# Patient Record
Sex: Male | Born: 1965 | Race: Black or African American | Hispanic: No | Smoking: Never smoker
Health system: Southern US, Community
[De-identification: ages and names within clinical notes are randomized; demographics above are authoritative.]

## PROBLEM LIST (undated history)

## (undated) ENCOUNTER — Emergency Department (HOSPITAL_COMMUNITY): Payer: Medicare Other | Source: Home / Self Care

## (undated) DIAGNOSIS — R0602 Shortness of breath: Secondary | ICD-10-CM

## (undated) DIAGNOSIS — M109 Gout, unspecified: Secondary | ICD-10-CM

## (undated) DIAGNOSIS — N289 Disorder of kidney and ureter, unspecified: Secondary | ICD-10-CM

## (undated) DIAGNOSIS — I499 Cardiac arrhythmia, unspecified: Secondary | ICD-10-CM

## (undated) DIAGNOSIS — G4700 Insomnia, unspecified: Secondary | ICD-10-CM

## (undated) DIAGNOSIS — Z930 Tracheostomy status: Secondary | ICD-10-CM

## (undated) DIAGNOSIS — K219 Gastro-esophageal reflux disease without esophagitis: Secondary | ICD-10-CM

## (undated) DIAGNOSIS — D649 Anemia, unspecified: Secondary | ICD-10-CM

## (undated) DIAGNOSIS — G4730 Sleep apnea, unspecified: Secondary | ICD-10-CM

## (undated) DIAGNOSIS — I10 Essential (primary) hypertension: Secondary | ICD-10-CM

## (undated) DIAGNOSIS — R5383 Other fatigue: Secondary | ICD-10-CM

## (undated) DIAGNOSIS — E7800 Pure hypercholesterolemia, unspecified: Secondary | ICD-10-CM

## (undated) DIAGNOSIS — R809 Proteinuria, unspecified: Secondary | ICD-10-CM

## (undated) DIAGNOSIS — I509 Heart failure, unspecified: Secondary | ICD-10-CM

## (undated) DIAGNOSIS — J189 Pneumonia, unspecified organism: Secondary | ICD-10-CM

## (undated) DIAGNOSIS — N189 Chronic kidney disease, unspecified: Secondary | ICD-10-CM

## (undated) HISTORY — DX: Heart failure, unspecified: I50.9

## (undated) HISTORY — DX: Pure hypercholesterolemia, unspecified: E78.00

## (undated) HISTORY — DX: Insomnia, unspecified: G47.00

## (undated) HISTORY — DX: Shortness of breath: R06.02

## (undated) HISTORY — PX: TRACHELECTOMY: SHX6586

## (undated) HISTORY — DX: Tracheostomy status: Z93.0

## (undated) HISTORY — DX: Gout, unspecified: M10.9

## (undated) HISTORY — DX: Other fatigue: R53.83

## (undated) HISTORY — DX: Disorder of kidney and ureter, unspecified: N28.9

## (undated) HISTORY — DX: Chronic kidney disease, unspecified: N18.9

## (undated) HISTORY — DX: Proteinuria, unspecified: R80.9

## (undated) HISTORY — PX: TRACHEOSTOMY: SUR1362

## (undated) HISTORY — DX: Essential (primary) hypertension: I10

---

## 2011-11-20 DIAGNOSIS — Z930 Tracheostomy status: Secondary | ICD-10-CM | POA: Insufficient documentation

## 2017-10-22 ENCOUNTER — Encounter: Payer: Self-pay | Admitting: Pulmonary Disease

## 2017-10-23 ENCOUNTER — Encounter: Payer: Self-pay | Admitting: Pulmonary Disease

## 2017-10-23 ENCOUNTER — Institutional Professional Consult (permissible substitution): Payer: Self-pay | Admitting: Pulmonary Disease

## 2017-10-23 ENCOUNTER — Ambulatory Visit (INDEPENDENT_AMBULATORY_CARE_PROVIDER_SITE_OTHER): Payer: Medicare Other | Admitting: Pulmonary Disease

## 2017-10-23 DIAGNOSIS — G4733 Obstructive sleep apnea (adult) (pediatric): Secondary | ICD-10-CM | POA: Diagnosis not present

## 2017-10-23 DIAGNOSIS — Z930 Tracheostomy status: Secondary | ICD-10-CM | POA: Diagnosis not present

## 2017-10-23 NOTE — Patient Instructions (Signed)
Schedule split study with tracheostomy closed and off oxygen  Based on this, we will see if you need CPAP

## 2017-10-23 NOTE — Assessment & Plan Note (Signed)
He is interested in seeing whether he can do without a tracheostomy. We will perform attended polysomnogram with tracheostomy And use oxygen only if needed   The pathophysiology of obstructive sleep apnea , it's cardiovascular consequences & modes of treatment including CPAP were discused with the patient in detail & they evidenced understanding.

## 2017-10-23 NOTE — Progress Notes (Signed)
Subjective:    Patient ID: Jack Cox, male    DOB: 21-Feb-1966, 51 y.o.   MRN: [id number]  HPI  Chief Complaint  Patient presents with  . Sleep Consult    Has a history of OSA. Was diagnosed while he was in the [employer]. Is currently [employer] as his DME. [facility] keeps sending the incorrect supplies.     51 year old obese man with chronic tracheostomy and severe OSA presents for further management. He was diagnosed with severe OSA in 1992 by sleep study done in [facility].  He went to [facility] for urological procedure in 2001 and came out with a tracheostomy.  Since then he is also been maintained on 4 L oxygen.  He has seen his ENT Dr. Renato Battles and undergoes tracheostomy change every few months.  I have reviewed ENT notes and there seems to be some interest in decannulation provided he would tolerate CPAP with tracheostomy. Patient however has had some issues with DME and he is under the impression that this consultation is so that he can get supplies from DME.  He has been with [facility] for many years liquid oxygen and has had problems getting the right kind of tubing with valves to commit to his oxygen machine.  His PCP has initiated a change of DME to [facility] and he is awaiting supplies.  Epworth sleepiness score is 19 and he reports sleepiness in various situations including sitting and reading, watching TV, sitting quietly after lunch was a passenger in a car.  Bedtime is between 1 PM and midnight, he is compliant with his oxygen and sleeps with his tracheostomy uncapped, sleep latency is about 30 minutes, he sleeps on his side with 2 pillows, reports 2-3 nocturnal awakenings including nocturia.  He takes Lasix 2 tablets in the morning and one around 7 PM.  Is out of bed by 7 AM feeling tired with occasional headaches, denies dryness of mouth. He is to weigh around 385 pounds around 2001 and now has lost considerable weight to 67 pounds.  He is a hypertensive and also  carries a diagnosis of diastolic heart failure There is no history suggestive of cataplexy, sleep paralysis or parasomnias    Past Medical History:  Diagnosis Date  . CKD (chronic kidney disease)   . Congestive heart failure (CHF) ([place])   . Essential hypertension   . Fatigue   . Gout   . Hypercholesteremia   . Insomnia   . Proteinuria   . Renal insufficiency   . Shortness of breath   . Tracheostomy dependence ([place])     No Known Allergies   Social History   Socioeconomic History  . Marital status: Married    Spouse name: Not on file  . Number of children: Not on file  . Years of education: Not on file  . Highest education level: Not on file  Social Needs  . Financial resource strain: Not on file  . Food insecurity - worry: Not on file  . Food insecurity - inability: Not on file  . Transportation needs - medical: Not on file  . Transportation needs - non-medical: Not on file  Occupational History  . Not on file  Tobacco Use  . Smoking status: Never Smoker  . Smokeless tobacco: Never Used  Substance and Sexual Activity  . Alcohol use: No    Frequency: Never  . Drug use: No  . Sexual activity: Not on file  Other Topics Concern  . Not on file  Social History Narrative  . Not on file       Family History  Problem Relation Age of Onset  . Stomach cancer Mother   . Colon cancer Father      Review of Systems Positive for weight loss as well, feet swelling, joint stiffness  Constitutional: negative for anorexia, fevers and sweats  Eyes: negative for irritation, redness and visual disturbance  Ears, nose, mouth, throat, and face: negative for earaches, epistaxis, nasal congestion and sore throat  Respiratory: negative for cough, dyspnea on exertion, sputum and wheezing  Cardiovascular: negative for chest pain, dyspnea, lower extremity edema, orthopnea, palpitations and syncope  Gastrointestinal: negative for abdominal pain, constipation, diarrhea, melena,  nausea and vomiting  Genitourinary:negative for dysuria, frequency and hematuria  Hematologic/lymphatic: negative for bleeding, easy bruising and lymphadenopathy  Musculoskeletal:negative for arthralgias, muscle weakness and stiff joints  Neurological: negative for coordination problems, gait problems, headaches and weakness  Endocrine: negative for diabetic symptoms including polydipsia, polyuria and weight loss     Objective:   Physical Exam  Gen. Pleasant, obese, in no distress, normal affect ENT - #4 tstomy , no post nasal drip, class 3 airway Neck: No JVD, no thyromegaly, no carotid bruits Lungs: no use of accessory muscles, no dullness to percussion, decreased without rales or rhonchi  Cardiovascular: Rhythm regular, heart sounds  normal, no murmurs or gallops, no peripheral edema Abdomen: soft and non-tender, no hepatosplenomegaly, BS normal. Musculoskeletal: No deformities, no cyanosis or clubbing Neuro:  alert, non focal, no tremors       Assessment & Plan:

## 2017-10-23 NOTE — Assessment & Plan Note (Signed)
He has size 4 tracheostomy. He would need to tolerate CPAP with good compliance and good control of events and only then we can consider tracheostomy decannulation.  We will start working towards this goal

## 2017-12-02 ENCOUNTER — Ambulatory Visit: Payer: Medicare Other | Attending: Pulmonary Disease | Admitting: Pulmonary Disease

## 2017-12-02 DIAGNOSIS — G4733 Obstructive sleep apnea (adult) (pediatric): Secondary | ICD-10-CM | POA: Insufficient documentation

## 2017-12-04 ENCOUNTER — Telehealth: Payer: Self-pay | Admitting: Pulmonary Disease

## 2017-12-04 DIAGNOSIS — G4733 Obstructive sleep apnea (adult) (pediatric): Secondary | ICD-10-CM

## 2017-12-04 NOTE — Telephone Encounter (Signed)
Rx for  Auto CPAP 10-15 cm with medium fullface mask , DL in 1 month FU visit with me in 6 wks

## 2017-12-04 NOTE — Progress Notes (Signed)
Patient Name: Jack Cox, Jack Cox Date: 12/02/2017 Gender: Male D.O.B: 03-07-1966 Age (years): 15 Referring Provider: Kara Mead MD, ABSM Height (inches): 67 Interpreting Physician: Kara Mead MD, ABSM Weight (lbs): 268 RPSGT: Rosebud Poles BMI: 42 MRN: [id number] Neck Size: 17.50 <br> <br> CLINICAL INFORMATION Sleep Study Type: Split Night CPAP performed with tracheostomy closed  Indication for sleep study: OSA  Epworth Sleepiness Score: 15  SLEEP STUDY TECHNIQUE As per the AASM Manual for the Scoring of Sleep and Associated Events v2.3 (April 2016) with a hypopnea requiring 4% desaturations.  The channels recorded and monitored were frontal, central and occipital EEG, electrooculogram (EOG), submentalis EMG (chin), nasal and oral airflow, thoracic and abdominal wall motion, anterior tibialis EMG, snore microphone, electrocardiogram, and pulse oximetry. Continuous positive airway pressure (CPAP) was initiated when the patient met split night criteria and was titrated according to treat sleep-disordered breathing.  MEDICATIONS Medications self-administered by patient taken the night of the study : N/A  RESPIRATORY PARAMETERS Diagnostic  Total AHI (/hr): 78.7 RDI (/hr): 78.7 OA Index (/hr): 32.6 CA Index (/hr): 0.0 REM AHI (/hr): 85.7 NREM AHI (/hr): 78.3 Supine AHI (/hr): N/A Non-supine AHI (/hr): 78.70 Min O2 Sat (%): 74.00 Mean O2 (%): 89.99 Time below 88% (min): 36.2   Titration  Optimal Pressure (cm): 12 cm AHI at Optimal Pressure (/hr): 0 Min O2 at Optimal Pressure (%): 90 Supine % at Optimal (%): 0 Sleep % at Optimal (%): N/A   SLEEP ARCHITECTURE The recording time for the entire night was 414.6 minutes.  During a baseline period of 136.6 minutes, the patient slept for 123.5 minutes in REM and nonREM, yielding a sleep efficiency of 90.4%. Sleep onset after lights out was 6.3 minutes with a REM latency of 72.5 minutes. The patient spent 6.48% of the night in  stage N1 sleep, 78.54% in stage N2 sleep, 9.31% in stage N3 and 5.67% in REM.    During the titration period of 272.6 minutes, the patient slept for 210.3 minutes in REM and nonREM, yielding a sleep efficiency of 77.2%. Sleep onset after CPAP initiation was 6.3 minutes with a REM latency of 56.0 minutes. The patient spent 2.62% of the night in stage N1 sleep, 39.37% in stage N2 sleep, 17.83% in stage N3 and 40.18% in REM.  CARDIAC DATA The 2 lead EKG demonstrated sinus rhythm. The mean heart rate was 64.05 beats per minute. Other EKG findings include: None. LEG MOVEMENT DATA The total Periodic Limb Movements of Sleep (PLMS) were 0. The PLMS index was 0.00 .  IMPRESSIONS - Severe obstructive sleep apnea occurred during the diagnostic portion of the study (AHI = 78.7/hour). An optimal PAP pressure of 12 cm was selected for this patient based on the available study data. - No significant central sleep apnea occurred during the diagnostic portion of the study (CAI = 0.0/hour). - Severe oxygen desaturation was noted during the diagnostic portion of the study (Min O2 = 74.00%). - The patient snored with moderate snoring volume during the diagnostic portion of the study. - No cardiac abnormalities were noted during this study. - Clinically significant periodic limb movements did not occur during sleep.   DIAGNOSIS - Obstructive Sleep Apnea (327.23 [G47.33 ICD-10])   RECOMMENDATIONS - CPAP 12 cm with medium fullface mask , Note that no supine sleep was noted during this study & he may need higher pressure during supine sleep. - Avoid alcohol, sedatives and other CNS depressants that may worsen sleep apnea and disrupt normal sleep architecture. - Sleep  hygiene should be reviewed to assess factors that may improve sleep quality. - Weight management and regular exercise should be initiated or continued. - Return to Sleep Center for re-evaluation after 4 weeks of therapy   Kara Mead MD Board  Certified in [place]

## 2017-12-05 NOTE — Telephone Encounter (Signed)
Spoke with patient. He wishes to proceed with CPAP order. Will place order today. Patient has been scheduled with RA for 01/15/18 at 945am.

## 2017-12-21 NOTE — Procedures (Signed)
Patient Name: Jack Cox, [place] Date: 12/02/2017 Gender: Male D.O.B: August 21, 1966 Age (years): 39 Referring Provider: Kara Mead MD, ABSM Height (inches): 67 Interpreting Physician: Kara Mead MD, ABSM Weight (lbs): 268 RPSGT: Rosebud Poles BMI: 42 MRN: [id number] Neck Size: 17.50 <br> <br> CLINICAL INFORMATION Sleep Study Type: Split Night CPAP performed with tracheostomy closed  Indication for sleep study: OSA  Epworth Sleepiness Score: 15  SLEEP STUDY TECHNIQUE As per the AASM Manual for the Scoring of Sleep and Associated Events v2.3 (April 2016) with a hypopnea requiring 4% desaturations.  The channels recorded and monitored were frontal, central and occipital EEG, electrooculogram (EOG), submentalis EMG (chin), nasal and oral airflow, thoracic and abdominal wall motion, anterior tibialis EMG, snore microphone, electrocardiogram, and pulse oximetry. Continuous positive airway pressure (CPAP) was initiated when the patient met split night criteria and was titrated according to treat sleep-disordered breathing.  MEDICATIONS Medications self-administered by patient taken the night of the study : N/A  RESPIRATORY PARAMETERS Diagnostic  Total AHI (/hr): 78.7 RDI (/hr): 78.7 OA Index (/hr): 32.6 CA Index (/hr): 0.0 REM AHI (/hr): 85.7 NREM AHI (/hr): 78.3 Supine AHI (/hr): N/A Non-supine AHI (/hr): 78.70 Min O2 Sat (%): 74.00 Mean O2 (%): 89.99 Time below 88% (min): 36.2   Titration  Optimal Pressure (cm): 12 cm AHI at Optimal Pressure (/hr): 0 Min O2 at Optimal Pressure (%): 90 Supine % at Optimal (%): 0 Sleep % at Optimal (%): N/A   SLEEP ARCHITECTURE The recording time for the entire night was 414.6 minutes.  During a baseline period of 136.6 minutes, the patient slept for 123.5 minutes in REM and nonREM, yielding a sleep efficiency of 90.4%. Sleep onset after lights out was 6.3 minutes with a REM latency of 72.5 minutes. The patient spent 6.48% of the night in  stage N1 sleep, 78.54% in stage N2 sleep, 9.31% in stage N3 and 5.67% in REM.    During the titration period of 272.6 minutes, the patient slept for 210.3 minutes in REM and nonREM, yielding a sleep efficiency of 77.2%. Sleep onset after CPAP initiation was 6.3 minutes with a REM latency of 56.0 minutes. The patient spent 2.62% of the night in stage N1 sleep, 39.37% in stage N2 sleep, 17.83% in stage N3 and 40.18% in REM.  CARDIAC DATA The 2 lead EKG demonstrated sinus rhythm. The mean heart rate was 64.05 beats per minute. Other EKG findings include: None. LEG MOVEMENT DATA The total Periodic Limb Movements of Sleep (PLMS) were 0. The PLMS index was 0.00 .  IMPRESSIONS - Severe obstructive sleep apnea occurred during the diagnostic portion of the study (AHI = 78.7/hour). An optimal PAP pressure of 12 cm was selected for this patient based on the available study data. - No significant central sleep apnea occurred during the diagnostic portion of the study (CAI = 0.0/hour). - Severe oxygen desaturation was noted during the diagnostic portion of the study (Min O2 = 74.00%). - The patient snored with moderate snoring volume during the diagnostic portion of the study. - No cardiac abnormalities were noted during this study. - Clinically significant periodic limb movements did not occur during sleep.   DIAGNOSIS - Obstructive Sleep Apnea (327.23 [G47.33 ICD-10])   RECOMMENDATIONS - CPAP 12 cm with medium fullface mask , Note that no supine sleep was noted during this study & he may need higher pressure during supine sleep. - Avoid alcohol, sedatives and other CNS depressants that may worsen sleep apnea and disrupt normal sleep architecture. - Sleep  hygiene should be reviewed to assess factors that may improve sleep quality. - Weight management and regular exercise should be initiated or continued. - Return to Sleep Center for re-evaluation after 4 weeks of therapy   Kara Mead MD Board  Certified in [place]

## 2018-01-15 ENCOUNTER — Ambulatory Visit: Payer: Medicare Other | Admitting: Pulmonary Disease

## 2018-01-15 ENCOUNTER — Ambulatory Visit: Payer: Medicare Other | Admitting: Adult Health

## 2018-02-05 ENCOUNTER — Ambulatory Visit (INDEPENDENT_AMBULATORY_CARE_PROVIDER_SITE_OTHER): Payer: Medicare Other | Admitting: Adult Health

## 2018-02-05 ENCOUNTER — Encounter: Payer: Self-pay | Admitting: Adult Health

## 2018-02-05 DIAGNOSIS — J9611 Chronic respiratory failure with hypoxia: Secondary | ICD-10-CM | POA: Diagnosis not present

## 2018-02-05 DIAGNOSIS — G4733 Obstructive sleep apnea (adult) (pediatric): Secondary | ICD-10-CM | POA: Diagnosis not present

## 2018-02-05 DIAGNOSIS — Z930 Tracheostomy status: Secondary | ICD-10-CM | POA: Diagnosis not present

## 2018-02-05 NOTE — Addendum Note (Signed)
Addended by: Parke Poisson E on: 02/05/2018 12:53 PM   Modules accepted: Orders

## 2018-02-05 NOTE — Assessment & Plan Note (Signed)
Cont w/ O2 with act

## 2018-02-05 NOTE — Assessment & Plan Note (Signed)
Cont w/ trach care .

## 2018-02-05 NOTE — Progress Notes (Signed)
@Patient  ID: Jack Cox, male    DOB: [birth year], 52 y.o.   MRN: [id number]  Chief Complaint  Patient presents with  . Follow-up    OSA     Referring provider: [name], MD  HPI: 52 year old male followed for sleep apnea, and chronic trach Diagnosed with severe obstructive sleep apnea in 1992 by sleep study done and it [facility]. 2001 resulting in a tracheostomy  TEST  Sleep study January 2019 severe OSA, AHI 78/hr .  With severe oxygen desaturations, SaO2 low 74%  02/05/2018 Follow up : OSA  He returns for a 31-month follow-up.  Patient was seen last visit for a sleep consult.  Patient has underlying chronic tracheostomy since 2001.  For what appears to be severe obstructive sleep apnea.  He had been using trach with oxygen at nighttime.  Previously had been on CPAP many years ago patient was set up for a sleep study.  Done in January 2019 .  Patient underwent study with capped trach, study showed severe sleep apnea with AHI at 78.  With severe oxygen desaturations during the diagnostic portion.  SaO2 low 74%.  Patient was started on CPAP with optimal control at 12 cm.  Patient has recently been started on CPAP.  He has been wearing it each night.  Download shows good compliance.  Average usage at 6 hours.  Patient is on auto CPAP 10-15 [place] H2O.  AHI 5.3.  Positive leaks.  Average pressure of 14.5. Patient says he feels rested does still get sleepy during the daytime.  Occasionally takes a nap..  Patient says he does use oxygen during the daytime with activity.  However does not have any tubing for his portable system   No Known Allergies  Immunization History  Administered Date(s) Administered  . Influenza Split 11/06/2017    Past Medical History:  Diagnosis Date  . CKD (chronic kidney disease)   . Congestive heart failure (CHF) ([place])   . Essential hypertension   . Fatigue   . Gout   . Hypercholesteremia   . Insomnia   . Proteinuria   . Renal  insufficiency   . Shortness of breath   . Tracheostomy dependence ([place])     Tobacco History: Social History   Tobacco Use  Smoking Status Never Smoker  Smokeless Tobacco Never Used   Counseling given: Not Answered   Outpatient Encounter Medications as of 02/05/2018  Medication Sig  . allopurinol (ZYLOPRIM) 300 MG tablet Take 300 mg by mouth daily.  Marland Kitchen aspirin EC 81 MG tablet Take 81 mg by mouth daily.  . furosemide (LASIX) 40 MG tablet Take 40 mg by mouth.  Marland Kitchen lisinopril (PRINIVIL,ZESTRIL) 40 MG tablet Take 40 mg by mouth daily.  . metoprolol succinate (TOPROL-XL) 50 MG 24 hr tablet Take 50 mg by mouth daily. Take with or immediately following a meal.  . mirtazapine (REMERON) 30 MG tablet Take 30 mg by mouth at bedtime.   No facility-administered encounter medications on file as of 02/05/2018.      Review of Systems  Constitutional:   No  weight loss, night sweats,  Fevers, chills, fatigue, or  lassitude.  HEENT:   No headaches,  Difficulty swallowing,  Tooth/dental problems, or  Sore throat,                No sneezing, itching, ear ache, nasal congestion, post nasal drip,   CV:  No chest pain,  Orthopnea, PND, , anasarca, dizziness, palpitations, syncope.  GI  No heartburn, indigestion, abdominal pain, nausea, vomiting, diarrhea, change in bowel habits, loss of appetite, bloody stools.   Resp: No shortness of breath with exertion or at rest.  No excess mucus, no productive cough,  No non-productive cough,  No coughing up of blood.  No change in color of mucus.  No wheezing.  No chest wall deformity  Skin: no rash or lesions.  GU: no dysuria, change in color of urine, no urgency or frequency.  No flank pain, no hematuria   MS:  No joint pain or swelling.  No decreased range of motion.  No back pain.    Physical Exam  BP 124/74 (BP Location: Left Arm, Cuff Size: Normal)   Pulse 61   Ht 5\' 8"  (1.727 m)   Wt 263 lb 9.6 oz (119.6 kg)   SpO2 93%   BMI 40.08 kg/m    GEN: A/Ox3; pleasant , NAD, obese   HEENT:  [place]/AT,  EACs-clear, TMs-wnl, NOSE-clear, THROAT-clear, no lesions, no postnasal drip or exudate noted.  Class III MP airway   NECK:  Supple w/ fair ROM; no JVD; normal carotid impulses w/o bruits; no thyromegaly or nodules palpated; no lymphadenopathy.   Trach midline , clean   RESP  Clear  P & A; w/o, wheezes/ rales/ or rhonchi. no accessory muscle use, no dullness to percussion  CARD:  RRR, no m/r/g, 1+ peripheral edema, pulses intact, no cyanosis or clubbing.  GI:   Soft & nt; nml bowel sounds; no organomegaly or masses detected.   Musco: Warm bil, no deformities or joint swelling noted.   Neuro: alert, no focal deficits noted.    Skin: Warm, no lesions or rashes    Lab Results:  CBC No results found for: WBC, RBC, HGB, HCT, PLT, MCV, MCH, MCHC, RDW, LYMPHSABS, MONOABS, EOSABS, BASOSABS  BMET No results found for: NA, K, CL, CO2, GLUCOSE, BUN, CREATININE, CALCIUM, GFRNONAA, GFRAA  BNP No results found for: BNP  ProBNP No results found for: PROBNP  Imaging: No results found.   Assessment & Plan:   OSA (obstructive sleep apnea) Severe sleep apnea in patient with morbid obesity and chronic trach Patient has done well with CPAP at night with Trach.  Download shows good compliance and control.  Will increase auto CPAP from 10-16 Patient is advised to use at bedtime and with naps. Would monitor for next 3-4 months on CPAP to verify compliance and control and then decide with ENT if decannulation remains an option going forward . If not going to decannulate may want to go back to uncapped trach with O2 At bedtime  .    Plan  Patient Instructions  Continue on CPAP at bedtime and with naps Continue with trach care Use oxygen with activity as needed Follow-up with Dr. Elsworth Soho in 4 months and as needed     Tracheostomy dependent ([place]) Cont w/ trach care .    Chronic respiratory failure with hypoxia ([facility]) Cont w/ O2  with act      Rexene Edison, NP 02/05/2018

## 2018-02-05 NOTE — Assessment & Plan Note (Addendum)
Severe sleep apnea in patient with morbid obesity and chronic trach Patient has done well with CPAP at night with Trach.  Download shows good compliance and control.  Will increase auto CPAP from 10-16 Patient is advised to use at bedtime and with naps. Would monitor for next 3-4 months on CPAP to verify compliance and control and then decide with ENT if decannulation remains an option going forward . If not going to decannulate may want to go back to uncapped trach with O2 At bedtime  .    Plan  Patient Instructions  Continue on CPAP at bedtime and with naps Continue with trach care Use oxygen with activity as needed Follow-up with Dr. Elsworth Soho in 4 months and as needed

## 2018-02-05 NOTE — Patient Instructions (Signed)
Continue on CPAP at bedtime and with naps Continue with trach care Use oxygen with activity as needed Follow-up with Dr. Elsworth Soho in 4 months and as needed

## 2018-02-11 NOTE — Progress Notes (Signed)
Reviewed & agree with plan  

## 2018-06-11 ENCOUNTER — Encounter: Payer: Self-pay | Admitting: Adult Health

## 2019-02-27 DIAGNOSIS — I10 Essential (primary) hypertension: Secondary | ICD-10-CM | POA: Diagnosis not present

## 2019-02-27 DIAGNOSIS — I509 Heart failure, unspecified: Secondary | ICD-10-CM | POA: Diagnosis not present

## 2019-02-27 DIAGNOSIS — Z6841 Body Mass Index (BMI) 40.0 and over, adult: Secondary | ICD-10-CM | POA: Diagnosis not present

## 2019-02-27 DIAGNOSIS — Z789 Other specified health status: Secondary | ICD-10-CM | POA: Diagnosis not present

## 2019-02-27 DIAGNOSIS — Z299 Encounter for prophylactic measures, unspecified: Secondary | ICD-10-CM | POA: Diagnosis not present

## 2019-03-13 DIAGNOSIS — Z6841 Body Mass Index (BMI) 40.0 and over, adult: Secondary | ICD-10-CM | POA: Diagnosis not present

## 2019-03-13 DIAGNOSIS — M109 Gout, unspecified: Secondary | ICD-10-CM | POA: Diagnosis not present

## 2019-03-13 DIAGNOSIS — I10 Essential (primary) hypertension: Secondary | ICD-10-CM | POA: Diagnosis not present

## 2019-03-13 DIAGNOSIS — G4730 Sleep apnea, unspecified: Secondary | ICD-10-CM | POA: Diagnosis not present

## 2019-03-13 DIAGNOSIS — Z299 Encounter for prophylactic measures, unspecified: Secondary | ICD-10-CM | POA: Diagnosis not present

## 2019-03-13 DIAGNOSIS — I509 Heart failure, unspecified: Secondary | ICD-10-CM | POA: Diagnosis not present

## 2019-03-14 DIAGNOSIS — M159 Polyosteoarthritis, unspecified: Secondary | ICD-10-CM | POA: Diagnosis not present

## 2019-03-14 DIAGNOSIS — E7800 Pure hypercholesterolemia, unspecified: Secondary | ICD-10-CM | POA: Diagnosis not present

## 2019-03-14 DIAGNOSIS — I10 Essential (primary) hypertension: Secondary | ICD-10-CM | POA: Diagnosis not present

## 2019-03-14 DIAGNOSIS — I509 Heart failure, unspecified: Secondary | ICD-10-CM | POA: Diagnosis not present

## 2019-07-26 DIAGNOSIS — I4892 Unspecified atrial flutter: Secondary | ICD-10-CM | POA: Insufficient documentation

## 2019-11-23 DIAGNOSIS — Z930 Tracheostomy status: Secondary | ICD-10-CM | POA: Diagnosis not present

## 2019-12-16 DIAGNOSIS — N1832 Chronic kidney disease, stage 3b: Secondary | ICD-10-CM | POA: Diagnosis not present

## 2019-12-16 DIAGNOSIS — R809 Proteinuria, unspecified: Secondary | ICD-10-CM | POA: Diagnosis not present

## 2019-12-16 DIAGNOSIS — Z79899 Other long term (current) drug therapy: Secondary | ICD-10-CM | POA: Diagnosis not present

## 2019-12-16 DIAGNOSIS — E559 Vitamin D deficiency, unspecified: Secondary | ICD-10-CM | POA: Diagnosis not present

## 2019-12-16 DIAGNOSIS — D631 Anemia in chronic kidney disease: Secondary | ICD-10-CM | POA: Diagnosis not present

## 2019-12-18 DIAGNOSIS — I10 Essential (primary) hypertension: Secondary | ICD-10-CM | POA: Insufficient documentation

## 2019-12-24 DIAGNOSIS — Z930 Tracheostomy status: Secondary | ICD-10-CM | POA: Diagnosis not present

## 2019-12-24 DIAGNOSIS — E559 Vitamin D deficiency, unspecified: Secondary | ICD-10-CM | POA: Diagnosis not present

## 2019-12-24 DIAGNOSIS — R809 Proteinuria, unspecified: Secondary | ICD-10-CM | POA: Diagnosis not present

## 2019-12-24 DIAGNOSIS — E211 Secondary hyperparathyroidism, not elsewhere classified: Secondary | ICD-10-CM | POA: Diagnosis not present

## 2019-12-24 DIAGNOSIS — Z79899 Other long term (current) drug therapy: Secondary | ICD-10-CM | POA: Diagnosis not present

## 2019-12-24 DIAGNOSIS — N1832 Chronic kidney disease, stage 3b: Secondary | ICD-10-CM | POA: Diagnosis not present

## 2020-01-09 DIAGNOSIS — Z299 Encounter for prophylactic measures, unspecified: Secondary | ICD-10-CM | POA: Diagnosis not present

## 2020-01-09 DIAGNOSIS — I10 Essential (primary) hypertension: Secondary | ICD-10-CM | POA: Diagnosis not present

## 2020-01-09 DIAGNOSIS — M109 Gout, unspecified: Secondary | ICD-10-CM | POA: Diagnosis not present

## 2020-01-09 DIAGNOSIS — Z713 Dietary counseling and surveillance: Secondary | ICD-10-CM | POA: Diagnosis not present

## 2020-01-16 DIAGNOSIS — Z930 Tracheostomy status: Secondary | ICD-10-CM | POA: Diagnosis not present

## 2020-01-21 DIAGNOSIS — Z930 Tracheostomy status: Secondary | ICD-10-CM | POA: Diagnosis not present

## 2020-01-26 DIAGNOSIS — M109 Gout, unspecified: Secondary | ICD-10-CM | POA: Diagnosis not present

## 2020-01-26 DIAGNOSIS — I4891 Unspecified atrial fibrillation: Secondary | ICD-10-CM | POA: Diagnosis not present

## 2020-01-26 DIAGNOSIS — I509 Heart failure, unspecified: Secondary | ICD-10-CM | POA: Diagnosis not present

## 2020-01-26 DIAGNOSIS — Z299 Encounter for prophylactic measures, unspecified: Secondary | ICD-10-CM | POA: Diagnosis not present

## 2020-02-10 DIAGNOSIS — I4891 Unspecified atrial fibrillation: Secondary | ICD-10-CM | POA: Diagnosis not present

## 2020-02-10 DIAGNOSIS — I10 Essential (primary) hypertension: Secondary | ICD-10-CM | POA: Diagnosis not present

## 2020-02-10 DIAGNOSIS — I509 Heart failure, unspecified: Secondary | ICD-10-CM | POA: Diagnosis not present

## 2020-02-10 DIAGNOSIS — Z299 Encounter for prophylactic measures, unspecified: Secondary | ICD-10-CM | POA: Diagnosis not present

## 2020-02-21 DIAGNOSIS — Z930 Tracheostomy status: Secondary | ICD-10-CM | POA: Diagnosis not present

## 2020-02-24 DIAGNOSIS — I5032 Chronic diastolic (congestive) heart failure: Secondary | ICD-10-CM | POA: Diagnosis not present

## 2020-02-24 DIAGNOSIS — I483 Typical atrial flutter: Secondary | ICD-10-CM | POA: Diagnosis not present

## 2020-02-24 DIAGNOSIS — I10 Essential (primary) hypertension: Secondary | ICD-10-CM | POA: Diagnosis not present

## 2020-03-22 DIAGNOSIS — Z930 Tracheostomy status: Secondary | ICD-10-CM | POA: Diagnosis not present

## 2020-03-24 DIAGNOSIS — N189 Chronic kidney disease, unspecified: Secondary | ICD-10-CM | POA: Diagnosis not present

## 2020-03-24 DIAGNOSIS — Z79899 Other long term (current) drug therapy: Secondary | ICD-10-CM | POA: Diagnosis not present

## 2020-03-24 DIAGNOSIS — R809 Proteinuria, unspecified: Secondary | ICD-10-CM | POA: Diagnosis not present

## 2020-03-24 DIAGNOSIS — N1832 Chronic kidney disease, stage 3b: Secondary | ICD-10-CM | POA: Diagnosis not present

## 2020-03-24 DIAGNOSIS — E559 Vitamin D deficiency, unspecified: Secondary | ICD-10-CM | POA: Diagnosis not present

## 2020-03-24 DIAGNOSIS — D631 Anemia in chronic kidney disease: Secondary | ICD-10-CM | POA: Diagnosis not present

## 2020-03-26 DIAGNOSIS — M25561 Pain in right knee: Secondary | ICD-10-CM | POA: Diagnosis not present

## 2020-03-26 DIAGNOSIS — Z789 Other specified health status: Secondary | ICD-10-CM | POA: Diagnosis not present

## 2020-03-26 DIAGNOSIS — E7800 Pure hypercholesterolemia, unspecified: Secondary | ICD-10-CM | POA: Diagnosis not present

## 2020-03-26 DIAGNOSIS — Z299 Encounter for prophylactic measures, unspecified: Secondary | ICD-10-CM | POA: Diagnosis not present

## 2020-03-26 DIAGNOSIS — I10 Essential (primary) hypertension: Secondary | ICD-10-CM | POA: Diagnosis not present

## 2020-03-26 DIAGNOSIS — I509 Heart failure, unspecified: Secondary | ICD-10-CM | POA: Diagnosis not present

## 2020-03-26 DIAGNOSIS — M1710 Unilateral primary osteoarthritis, unspecified knee: Secondary | ICD-10-CM | POA: Diagnosis not present

## 2020-04-07 DIAGNOSIS — I10 Essential (primary) hypertension: Secondary | ICD-10-CM | POA: Diagnosis not present

## 2020-04-07 DIAGNOSIS — Z299 Encounter for prophylactic measures, unspecified: Secondary | ICD-10-CM | POA: Diagnosis not present

## 2020-04-07 DIAGNOSIS — M25511 Pain in right shoulder: Secondary | ICD-10-CM | POA: Diagnosis not present

## 2020-04-07 DIAGNOSIS — Z789 Other specified health status: Secondary | ICD-10-CM | POA: Diagnosis not present

## 2020-04-22 DIAGNOSIS — Z930 Tracheostomy status: Secondary | ICD-10-CM | POA: Diagnosis not present

## 2020-04-26 DIAGNOSIS — Z0000 Encounter for general adult medical examination without abnormal findings: Secondary | ICD-10-CM | POA: Diagnosis not present

## 2020-04-26 DIAGNOSIS — Z7189 Other specified counseling: Secondary | ICD-10-CM | POA: Diagnosis not present

## 2020-04-26 DIAGNOSIS — Z1211 Encounter for screening for malignant neoplasm of colon: Secondary | ICD-10-CM | POA: Diagnosis not present

## 2020-04-26 DIAGNOSIS — I10 Essential (primary) hypertension: Secondary | ICD-10-CM | POA: Diagnosis not present

## 2020-04-26 DIAGNOSIS — Z299 Encounter for prophylactic measures, unspecified: Secondary | ICD-10-CM | POA: Diagnosis not present

## 2020-04-26 DIAGNOSIS — R5383 Other fatigue: Secondary | ICD-10-CM | POA: Diagnosis not present

## 2020-04-26 DIAGNOSIS — Z79899 Other long term (current) drug therapy: Secondary | ICD-10-CM | POA: Diagnosis not present

## 2020-04-26 DIAGNOSIS — E7800 Pure hypercholesterolemia, unspecified: Secondary | ICD-10-CM | POA: Diagnosis not present

## 2020-04-29 DIAGNOSIS — Z299 Encounter for prophylactic measures, unspecified: Secondary | ICD-10-CM | POA: Diagnosis not present

## 2020-04-29 DIAGNOSIS — I509 Heart failure, unspecified: Secondary | ICD-10-CM | POA: Diagnosis not present

## 2020-04-29 DIAGNOSIS — M2550 Pain in unspecified joint: Secondary | ICD-10-CM | POA: Diagnosis not present

## 2020-04-29 DIAGNOSIS — N1831 Chronic kidney disease, stage 3a: Secondary | ICD-10-CM | POA: Diagnosis not present

## 2020-04-29 DIAGNOSIS — I4891 Unspecified atrial fibrillation: Secondary | ICD-10-CM | POA: Diagnosis not present

## 2020-05-05 DIAGNOSIS — E559 Vitamin D deficiency, unspecified: Secondary | ICD-10-CM | POA: Diagnosis not present

## 2020-05-05 DIAGNOSIS — E211 Secondary hyperparathyroidism, not elsewhere classified: Secondary | ICD-10-CM | POA: Diagnosis not present

## 2020-05-05 DIAGNOSIS — R6889 Other general symptoms and signs: Secondary | ICD-10-CM | POA: Diagnosis not present

## 2020-05-05 DIAGNOSIS — R809 Proteinuria, unspecified: Secondary | ICD-10-CM | POA: Diagnosis not present

## 2020-05-05 DIAGNOSIS — I129 Hypertensive chronic kidney disease with stage 1 through stage 4 chronic kidney disease, or unspecified chronic kidney disease: Secondary | ICD-10-CM | POA: Diagnosis not present

## 2020-05-05 DIAGNOSIS — N1832 Chronic kidney disease, stage 3b: Secondary | ICD-10-CM | POA: Diagnosis not present

## 2020-05-22 DIAGNOSIS — Z930 Tracheostomy status: Secondary | ICD-10-CM | POA: Diagnosis not present

## 2020-06-22 DIAGNOSIS — Z299 Encounter for prophylactic measures, unspecified: Secondary | ICD-10-CM | POA: Diagnosis not present

## 2020-06-22 DIAGNOSIS — I509 Heart failure, unspecified: Secondary | ICD-10-CM | POA: Diagnosis not present

## 2020-06-22 DIAGNOSIS — Z930 Tracheostomy status: Secondary | ICD-10-CM | POA: Diagnosis not present

## 2020-06-22 DIAGNOSIS — M25511 Pain in right shoulder: Secondary | ICD-10-CM | POA: Diagnosis not present

## 2020-06-22 DIAGNOSIS — I4891 Unspecified atrial fibrillation: Secondary | ICD-10-CM | POA: Diagnosis not present

## 2020-06-22 DIAGNOSIS — I10 Essential (primary) hypertension: Secondary | ICD-10-CM | POA: Diagnosis not present

## 2020-06-29 DIAGNOSIS — I129 Hypertensive chronic kidney disease with stage 1 through stage 4 chronic kidney disease, or unspecified chronic kidney disease: Secondary | ICD-10-CM | POA: Diagnosis not present

## 2020-06-29 DIAGNOSIS — R809 Proteinuria, unspecified: Secondary | ICD-10-CM | POA: Diagnosis not present

## 2020-06-29 DIAGNOSIS — E211 Secondary hyperparathyroidism, not elsewhere classified: Secondary | ICD-10-CM | POA: Diagnosis not present

## 2020-06-29 DIAGNOSIS — E559 Vitamin D deficiency, unspecified: Secondary | ICD-10-CM | POA: Diagnosis not present

## 2020-06-29 DIAGNOSIS — R799 Abnormal finding of blood chemistry, unspecified: Secondary | ICD-10-CM | POA: Diagnosis not present

## 2020-06-29 DIAGNOSIS — N1832 Chronic kidney disease, stage 3b: Secondary | ICD-10-CM | POA: Diagnosis not present

## 2020-07-07 DIAGNOSIS — N1832 Chronic kidney disease, stage 3b: Secondary | ICD-10-CM | POA: Diagnosis not present

## 2020-07-07 DIAGNOSIS — E211 Secondary hyperparathyroidism, not elsewhere classified: Secondary | ICD-10-CM | POA: Diagnosis not present

## 2020-07-07 DIAGNOSIS — R809 Proteinuria, unspecified: Secondary | ICD-10-CM | POA: Diagnosis not present

## 2020-07-07 DIAGNOSIS — N170 Acute kidney failure with tubular necrosis: Secondary | ICD-10-CM | POA: Diagnosis not present

## 2020-07-07 DIAGNOSIS — R6889 Other general symptoms and signs: Secondary | ICD-10-CM | POA: Diagnosis not present

## 2020-07-07 DIAGNOSIS — I129 Hypertensive chronic kidney disease with stage 1 through stage 4 chronic kidney disease, or unspecified chronic kidney disease: Secondary | ICD-10-CM | POA: Diagnosis not present

## 2020-07-22 DIAGNOSIS — N170 Acute kidney failure with tubular necrosis: Secondary | ICD-10-CM | POA: Diagnosis not present

## 2020-07-22 DIAGNOSIS — R809 Proteinuria, unspecified: Secondary | ICD-10-CM | POA: Diagnosis not present

## 2020-07-22 DIAGNOSIS — E211 Secondary hyperparathyroidism, not elsewhere classified: Secondary | ICD-10-CM | POA: Diagnosis not present

## 2020-07-22 DIAGNOSIS — N1832 Chronic kidney disease, stage 3b: Secondary | ICD-10-CM | POA: Diagnosis not present

## 2020-07-23 DIAGNOSIS — Z930 Tracheostomy status: Secondary | ICD-10-CM | POA: Diagnosis not present

## 2020-07-27 DIAGNOSIS — I509 Heart failure, unspecified: Secondary | ICD-10-CM | POA: Diagnosis not present

## 2020-07-27 DIAGNOSIS — Z299 Encounter for prophylactic measures, unspecified: Secondary | ICD-10-CM | POA: Diagnosis not present

## 2020-07-27 DIAGNOSIS — J069 Acute upper respiratory infection, unspecified: Secondary | ICD-10-CM | POA: Diagnosis not present

## 2020-07-27 DIAGNOSIS — I10 Essential (primary) hypertension: Secondary | ICD-10-CM | POA: Diagnosis not present

## 2020-07-27 DIAGNOSIS — I4891 Unspecified atrial fibrillation: Secondary | ICD-10-CM | POA: Diagnosis not present

## 2020-07-29 DIAGNOSIS — E211 Secondary hyperparathyroidism, not elsewhere classified: Secondary | ICD-10-CM | POA: Diagnosis not present

## 2020-07-29 DIAGNOSIS — N1832 Chronic kidney disease, stage 3b: Secondary | ICD-10-CM | POA: Diagnosis not present

## 2020-07-29 DIAGNOSIS — N170 Acute kidney failure with tubular necrosis: Secondary | ICD-10-CM | POA: Diagnosis not present

## 2020-07-29 DIAGNOSIS — I129 Hypertensive chronic kidney disease with stage 1 through stage 4 chronic kidney disease, or unspecified chronic kidney disease: Secondary | ICD-10-CM | POA: Diagnosis not present

## 2020-07-29 DIAGNOSIS — R6889 Other general symptoms and signs: Secondary | ICD-10-CM | POA: Diagnosis not present

## 2020-07-29 DIAGNOSIS — R809 Proteinuria, unspecified: Secondary | ICD-10-CM | POA: Diagnosis not present

## 2020-08-03 DIAGNOSIS — I509 Heart failure, unspecified: Secondary | ICD-10-CM | POA: Diagnosis not present

## 2020-08-03 DIAGNOSIS — Z299 Encounter for prophylactic measures, unspecified: Secondary | ICD-10-CM | POA: Diagnosis not present

## 2020-08-03 DIAGNOSIS — I4891 Unspecified atrial fibrillation: Secondary | ICD-10-CM | POA: Diagnosis not present

## 2020-08-03 DIAGNOSIS — I10 Essential (primary) hypertension: Secondary | ICD-10-CM | POA: Diagnosis not present

## 2020-08-22 DIAGNOSIS — Z930 Tracheostomy status: Secondary | ICD-10-CM | POA: Diagnosis not present

## 2020-08-25 DIAGNOSIS — N1832 Chronic kidney disease, stage 3b: Secondary | ICD-10-CM | POA: Diagnosis not present

## 2020-08-25 DIAGNOSIS — E211 Secondary hyperparathyroidism, not elsewhere classified: Secondary | ICD-10-CM | POA: Diagnosis not present

## 2020-08-25 DIAGNOSIS — R809 Proteinuria, unspecified: Secondary | ICD-10-CM | POA: Diagnosis not present

## 2020-08-25 DIAGNOSIS — N170 Acute kidney failure with tubular necrosis: Secondary | ICD-10-CM | POA: Diagnosis not present

## 2020-08-26 DIAGNOSIS — R6889 Other general symptoms and signs: Secondary | ICD-10-CM | POA: Diagnosis not present

## 2020-09-01 DIAGNOSIS — N1832 Chronic kidney disease, stage 3b: Secondary | ICD-10-CM | POA: Diagnosis not present

## 2020-09-01 DIAGNOSIS — E875 Hyperkalemia: Secondary | ICD-10-CM | POA: Diagnosis not present

## 2020-09-01 DIAGNOSIS — N170 Acute kidney failure with tubular necrosis: Secondary | ICD-10-CM | POA: Diagnosis not present

## 2020-09-01 DIAGNOSIS — I129 Hypertensive chronic kidney disease with stage 1 through stage 4 chronic kidney disease, or unspecified chronic kidney disease: Secondary | ICD-10-CM | POA: Diagnosis not present

## 2020-09-07 DIAGNOSIS — M25511 Pain in right shoulder: Secondary | ICD-10-CM | POA: Diagnosis not present

## 2020-09-08 DIAGNOSIS — I129 Hypertensive chronic kidney disease with stage 1 through stage 4 chronic kidney disease, or unspecified chronic kidney disease: Secondary | ICD-10-CM | POA: Diagnosis not present

## 2020-09-08 DIAGNOSIS — N1832 Chronic kidney disease, stage 3b: Secondary | ICD-10-CM | POA: Diagnosis not present

## 2020-09-08 DIAGNOSIS — E875 Hyperkalemia: Secondary | ICD-10-CM | POA: Diagnosis not present

## 2020-09-08 DIAGNOSIS — N170 Acute kidney failure with tubular necrosis: Secondary | ICD-10-CM | POA: Diagnosis not present

## 2020-09-14 DIAGNOSIS — I5032 Chronic diastolic (congestive) heart failure: Secondary | ICD-10-CM | POA: Diagnosis not present

## 2020-09-14 DIAGNOSIS — G4733 Obstructive sleep apnea (adult) (pediatric): Secondary | ICD-10-CM | POA: Diagnosis not present

## 2020-09-14 DIAGNOSIS — I483 Typical atrial flutter: Secondary | ICD-10-CM | POA: Diagnosis not present

## 2020-09-14 DIAGNOSIS — R6889 Other general symptoms and signs: Secondary | ICD-10-CM | POA: Diagnosis not present

## 2020-09-14 DIAGNOSIS — I10 Essential (primary) hypertension: Secondary | ICD-10-CM | POA: Diagnosis not present

## 2020-09-20 DIAGNOSIS — J9611 Chronic respiratory failure with hypoxia: Secondary | ICD-10-CM | POA: Diagnosis not present

## 2020-09-20 DIAGNOSIS — Z299 Encounter for prophylactic measures, unspecified: Secondary | ICD-10-CM | POA: Diagnosis not present

## 2020-09-20 DIAGNOSIS — I10 Essential (primary) hypertension: Secondary | ICD-10-CM | POA: Diagnosis not present

## 2020-09-20 DIAGNOSIS — I509 Heart failure, unspecified: Secondary | ICD-10-CM | POA: Diagnosis not present

## 2020-09-20 DIAGNOSIS — G4730 Sleep apnea, unspecified: Secondary | ICD-10-CM | POA: Diagnosis not present

## 2020-10-04 DIAGNOSIS — I509 Heart failure, unspecified: Secondary | ICD-10-CM | POA: Diagnosis not present

## 2020-10-04 DIAGNOSIS — Z930 Tracheostomy status: Secondary | ICD-10-CM | POA: Diagnosis not present

## 2020-10-04 DIAGNOSIS — I4891 Unspecified atrial fibrillation: Secondary | ICD-10-CM | POA: Diagnosis not present

## 2020-10-04 DIAGNOSIS — Z299 Encounter for prophylactic measures, unspecified: Secondary | ICD-10-CM | POA: Diagnosis not present

## 2020-10-04 DIAGNOSIS — I10 Essential (primary) hypertension: Secondary | ICD-10-CM | POA: Diagnosis not present

## 2020-10-14 DIAGNOSIS — N170 Acute kidney failure with tubular necrosis: Secondary | ICD-10-CM | POA: Diagnosis not present

## 2020-10-20 ENCOUNTER — Inpatient Hospital Stay (HOSPITAL_COMMUNITY)
Admission: EM | Admit: 2020-10-20 | Discharge: 2020-11-12 | DRG: 291 | Disposition: A | Discharge status: Other Acute Inpatient Hospital | Payer: Medicare Other | Attending: Family Medicine | Admitting: Family Medicine

## 2020-10-20 ENCOUNTER — Emergency Department (HOSPITAL_COMMUNITY): Payer: Medicare Other

## 2020-10-20 ENCOUNTER — Other Ambulatory Visit: Payer: Self-pay

## 2020-10-20 ENCOUNTER — Encounter (HOSPITAL_COMMUNITY): Payer: Self-pay | Admitting: Emergency Medicine

## 2020-10-20 DIAGNOSIS — E662 Morbid (severe) obesity with alveolar hypoventilation: Secondary | ICD-10-CM | POA: Diagnosis present

## 2020-10-20 DIAGNOSIS — Z930 Tracheostomy status: Secondary | ICD-10-CM

## 2020-10-20 DIAGNOSIS — I5033 Acute on chronic diastolic (congestive) heart failure: Secondary | ICD-10-CM | POA: Diagnosis not present

## 2020-10-20 DIAGNOSIS — J9601 Acute respiratory failure with hypoxia: Secondary | ICD-10-CM | POA: Diagnosis not present

## 2020-10-20 DIAGNOSIS — G4733 Obstructive sleep apnea (adult) (pediatric): Secondary | ICD-10-CM | POA: Diagnosis present

## 2020-10-20 DIAGNOSIS — I4892 Unspecified atrial flutter: Secondary | ICD-10-CM | POA: Diagnosis not present

## 2020-10-20 DIAGNOSIS — Z4682 Encounter for fitting and adjustment of non-vascular catheter: Secondary | ICD-10-CM | POA: Diagnosis not present

## 2020-10-20 DIAGNOSIS — J9602 Acute respiratory failure with hypercapnia: Secondary | ICD-10-CM

## 2020-10-20 DIAGNOSIS — N184 Chronic kidney disease, stage 4 (severe): Secondary | ICD-10-CM | POA: Diagnosis present

## 2020-10-20 DIAGNOSIS — R1310 Dysphagia, unspecified: Secondary | ICD-10-CM | POA: Diagnosis not present

## 2020-10-20 DIAGNOSIS — I2781 Cor pulmonale (chronic): Secondary | ICD-10-CM | POA: Diagnosis not present

## 2020-10-20 DIAGNOSIS — N189 Chronic kidney disease, unspecified: Secondary | ICD-10-CM | POA: Diagnosis not present

## 2020-10-20 DIAGNOSIS — Z20822 Contact with and (suspected) exposure to covid-19: Secondary | ICD-10-CM | POA: Diagnosis present

## 2020-10-20 DIAGNOSIS — Z4659 Encounter for fitting and adjustment of other gastrointestinal appliance and device: Secondary | ICD-10-CM

## 2020-10-20 DIAGNOSIS — Z79899 Other long term (current) drug therapy: Secondary | ICD-10-CM

## 2020-10-20 DIAGNOSIS — E46 Unspecified protein-calorie malnutrition: Secondary | ICD-10-CM | POA: Diagnosis not present

## 2020-10-20 DIAGNOSIS — Z6841 Body Mass Index (BMI) 40.0 and over, adult: Secondary | ICD-10-CM | POA: Diagnosis not present

## 2020-10-20 DIAGNOSIS — E870 Hyperosmolality and hypernatremia: Secondary | ICD-10-CM | POA: Diagnosis not present

## 2020-10-20 DIAGNOSIS — I129 Hypertensive chronic kidney disease with stage 1 through stage 4 chronic kidney disease, or unspecified chronic kidney disease: Secondary | ICD-10-CM | POA: Diagnosis not present

## 2020-10-20 DIAGNOSIS — K5730 Diverticulosis of large intestine without perforation or abscess without bleeding: Secondary | ICD-10-CM | POA: Diagnosis not present

## 2020-10-20 DIAGNOSIS — Z9981 Dependence on supplemental oxygen: Secondary | ICD-10-CM | POA: Diagnosis not present

## 2020-10-20 DIAGNOSIS — J9600 Acute respiratory failure, unspecified whether with hypoxia or hypercapnia: Secondary | ICD-10-CM | POA: Diagnosis not present

## 2020-10-20 DIAGNOSIS — I130 Hypertensive heart and chronic kidney disease with heart failure and stage 1 through stage 4 chronic kidney disease, or unspecified chronic kidney disease: Secondary | ICD-10-CM | POA: Diagnosis present

## 2020-10-20 DIAGNOSIS — T501X6A Underdosing of loop [high-ceiling] diuretics, initial encounter: Secondary | ICD-10-CM | POA: Diagnosis present

## 2020-10-20 DIAGNOSIS — J811 Chronic pulmonary edema: Secondary | ICD-10-CM | POA: Diagnosis not present

## 2020-10-20 DIAGNOSIS — Z431 Encounter for attention to gastrostomy: Secondary | ICD-10-CM

## 2020-10-20 DIAGNOSIS — N2581 Secondary hyperparathyroidism of renal origin: Secondary | ICD-10-CM | POA: Diagnosis present

## 2020-10-20 DIAGNOSIS — T17998A Other foreign object in respiratory tract, part unspecified causing other injury, initial encounter: Secondary | ICD-10-CM | POA: Diagnosis not present

## 2020-10-20 DIAGNOSIS — Z743 Need for continuous supervision: Secondary | ICD-10-CM | POA: Diagnosis not present

## 2020-10-20 DIAGNOSIS — J9690 Respiratory failure, unspecified, unspecified whether with hypoxia or hypercapnia: Secondary | ICD-10-CM | POA: Diagnosis not present

## 2020-10-20 DIAGNOSIS — M109 Gout, unspecified: Secondary | ICD-10-CM | POA: Diagnosis present

## 2020-10-20 DIAGNOSIS — R0902 Hypoxemia: Secondary | ICD-10-CM | POA: Diagnosis not present

## 2020-10-20 DIAGNOSIS — Z0189 Encounter for other specified special examinations: Secondary | ICD-10-CM

## 2020-10-20 DIAGNOSIS — J9811 Atelectasis: Secondary | ICD-10-CM | POA: Diagnosis present

## 2020-10-20 DIAGNOSIS — J90 Pleural effusion, not elsewhere classified: Secondary | ICD-10-CM | POA: Diagnosis not present

## 2020-10-20 DIAGNOSIS — J9622 Acute and chronic respiratory failure with hypercapnia: Secondary | ICD-10-CM | POA: Diagnosis present

## 2020-10-20 DIAGNOSIS — I4510 Unspecified right bundle-branch block: Secondary | ICD-10-CM | POA: Diagnosis not present

## 2020-10-20 DIAGNOSIS — R0602 Shortness of breath: Secondary | ICD-10-CM | POA: Diagnosis not present

## 2020-10-20 DIAGNOSIS — N170 Acute kidney failure with tubular necrosis: Secondary | ICD-10-CM | POA: Diagnosis not present

## 2020-10-20 DIAGNOSIS — J9620 Acute and chronic respiratory failure, unspecified whether with hypoxia or hypercapnia: Secondary | ICD-10-CM | POA: Diagnosis present

## 2020-10-20 DIAGNOSIS — I5030 Unspecified diastolic (congestive) heart failure: Secondary | ICD-10-CM | POA: Diagnosis not present

## 2020-10-20 DIAGNOSIS — J398 Other specified diseases of upper respiratory tract: Secondary | ICD-10-CM | POA: Diagnosis not present

## 2020-10-20 DIAGNOSIS — A419 Sepsis, unspecified organism: Secondary | ICD-10-CM | POA: Diagnosis not present

## 2020-10-20 DIAGNOSIS — R918 Other nonspecific abnormal finding of lung field: Secondary | ICD-10-CM | POA: Diagnosis not present

## 2020-10-20 DIAGNOSIS — J9621 Acute and chronic respiratory failure with hypoxia: Secondary | ICD-10-CM

## 2020-10-20 DIAGNOSIS — R809 Proteinuria, unspecified: Secondary | ICD-10-CM | POA: Diagnosis not present

## 2020-10-20 DIAGNOSIS — T17500A Unspecified foreign body in bronchus causing asphyxiation, initial encounter: Secondary | ICD-10-CM | POA: Diagnosis not present

## 2020-10-20 DIAGNOSIS — I509 Heart failure, unspecified: Secondary | ICD-10-CM

## 2020-10-20 DIAGNOSIS — E7800 Pure hypercholesterolemia, unspecified: Secondary | ICD-10-CM | POA: Diagnosis present

## 2020-10-20 DIAGNOSIS — Z7189 Other specified counseling: Secondary | ICD-10-CM | POA: Diagnosis not present

## 2020-10-20 DIAGNOSIS — I5031 Acute diastolic (congestive) heart failure: Secondary | ICD-10-CM | POA: Diagnosis not present

## 2020-10-20 DIAGNOSIS — K7460 Unspecified cirrhosis of liver: Secondary | ICD-10-CM | POA: Diagnosis not present

## 2020-10-20 DIAGNOSIS — Z7401 Bed confinement status: Secondary | ICD-10-CM | POA: Diagnosis not present

## 2020-10-20 DIAGNOSIS — E162 Hypoglycemia, unspecified: Secondary | ICD-10-CM | POA: Diagnosis not present

## 2020-10-20 DIAGNOSIS — G9340 Encephalopathy, unspecified: Secondary | ICD-10-CM | POA: Diagnosis not present

## 2020-10-20 DIAGNOSIS — Z515 Encounter for palliative care: Secondary | ICD-10-CM | POA: Diagnosis not present

## 2020-10-20 DIAGNOSIS — I110 Hypertensive heart disease with heart failure: Secondary | ICD-10-CM | POA: Diagnosis not present

## 2020-10-20 DIAGNOSIS — I5023 Acute on chronic systolic (congestive) heart failure: Secondary | ICD-10-CM | POA: Diagnosis not present

## 2020-10-20 DIAGNOSIS — I517 Cardiomegaly: Secondary | ICD-10-CM | POA: Diagnosis not present

## 2020-10-20 DIAGNOSIS — J189 Pneumonia, unspecified organism: Secondary | ICD-10-CM | POA: Diagnosis present

## 2020-10-20 DIAGNOSIS — G9341 Metabolic encephalopathy: Secondary | ICD-10-CM | POA: Diagnosis not present

## 2020-10-20 DIAGNOSIS — I482 Chronic atrial fibrillation, unspecified: Secondary | ICD-10-CM | POA: Diagnosis not present

## 2020-10-20 DIAGNOSIS — Z7982 Long term (current) use of aspirin: Secondary | ICD-10-CM

## 2020-10-20 DIAGNOSIS — Z800 Family history of malignant neoplasm of digestive organs: Secondary | ICD-10-CM

## 2020-10-20 DIAGNOSIS — Y95 Nosocomial condition: Secondary | ICD-10-CM | POA: Diagnosis not present

## 2020-10-20 DIAGNOSIS — N179 Acute kidney failure, unspecified: Secondary | ICD-10-CM

## 2020-10-20 DIAGNOSIS — N1832 Chronic kidney disease, stage 3b: Secondary | ICD-10-CM | POA: Diagnosis not present

## 2020-10-20 LAB — BLOOD GAS, ARTERIAL
Acid-Base Excess: 5.2 mmol/L — ABNORMAL HIGH (ref 0.0–2.0)
Bicarbonate: 25.9 mmol/L (ref 20.0–28.0)
FIO2: 70
O2 Saturation: 92.6 %
Patient temperature: 36.4
pCO2 arterial: 83.6 mmHg (ref 32.0–48.0)
pH, Arterial: 7.213 — ABNORMAL LOW (ref 7.350–7.450)
pO2, Arterial: 78.4 mmHg — ABNORMAL LOW (ref 83.0–108.0)

## 2020-10-20 LAB — BLOOD GAS, VENOUS
Acid-Base Excess: 5.8 mmol/L — ABNORMAL HIGH (ref 0.0–2.0)
Bicarbonate: 25.5 mmol/L (ref 20.0–28.0)
FIO2: 80
O2 Saturation: 95.9 %
Patient temperature: 36.5
pCO2, Ven: 97.8 mmHg (ref 44.0–60.0)
pH, Ven: 7.166 — CL (ref 7.250–7.430)
pO2, Ven: 106 mmHg — ABNORMAL HIGH (ref 32.0–45.0)

## 2020-10-20 LAB — CBC WITH DIFFERENTIAL/PLATELET
Abs Immature Granulocytes: 0.05 10*3/uL (ref 0.00–0.07)
Basophils Absolute: 0.1 10*3/uL (ref 0.0–0.1)
Basophils Relative: 1 %
Eosinophils Absolute: 0.1 10*3/uL (ref 0.0–0.5)
Eosinophils Relative: 1 %
HCT: 49.8 % (ref 39.0–52.0)
Hemoglobin: 14.5 g/dL (ref 13.0–17.0)
Immature Granulocytes: 1 %
Lymphocytes Relative: 12 %
Lymphs Abs: 0.9 10*3/uL (ref 0.7–4.0)
MCH: 29.7 pg (ref 26.0–34.0)
MCHC: 29.1 g/dL — ABNORMAL LOW (ref 30.0–36.0)
MCV: 101.8 fL — ABNORMAL HIGH (ref 80.0–100.0)
Monocytes Absolute: 0.8 10*3/uL (ref 0.1–1.0)
Monocytes Relative: 9 %
Neutro Abs: 6.2 10*3/uL (ref 1.7–7.7)
Neutrophils Relative %: 76 %
Platelets: 353 10*3/uL (ref 150–400)
RBC: 4.89 MIL/uL (ref 4.22–5.81)
RDW: 13.7 % (ref 11.5–15.5)
WBC: 8.1 10*3/uL (ref 4.0–10.5)
nRBC: 0.4 % — ABNORMAL HIGH (ref 0.0–0.2)

## 2020-10-20 LAB — COMPREHENSIVE METABOLIC PANEL
ALT: 30 U/L (ref 0–44)
AST: 33 U/L (ref 15–41)
Albumin: 3.2 g/dL — ABNORMAL LOW (ref 3.5–5.0)
Alkaline Phosphatase: 114 U/L (ref 38–126)
Anion gap: 6 (ref 5–15)
BUN: 61 mg/dL — ABNORMAL HIGH (ref 6–20)
CO2: 33 mmol/L — ABNORMAL HIGH (ref 22–32)
Calcium: 9.2 mg/dL (ref 8.9–10.3)
Chloride: 100 mmol/L (ref 98–111)
Creatinine, Ser: 2.43 mg/dL — ABNORMAL HIGH (ref 0.61–1.24)
GFR, Estimated: 31 mL/min — ABNORMAL LOW (ref >60)
Glucose, Bld: 97 mg/dL (ref 70–99)
Potassium: 4.5 mmol/L (ref 3.5–5.1)
Sodium: 139 mmol/L (ref 135–145)
Total Bilirubin: 0.4 mg/dL (ref 0.3–1.2)
Total Protein: 7.9 g/dL (ref 6.5–8.1)

## 2020-10-20 LAB — URINALYSIS, ROUTINE W REFLEX MICROSCOPIC
Bacteria, UA: NONE SEEN
Bilirubin Urine: NEGATIVE
Glucose, UA: 50 mg/dL — AB
Hgb urine dipstick: NEGATIVE
Ketones, ur: NEGATIVE mg/dL
Leukocytes,Ua: NEGATIVE
Nitrite: NEGATIVE
Protein, ur: >=300 mg/dL — AB
Specific Gravity, Urine: 1.011 (ref 1.005–1.030)
pH: 5 (ref 5.0–8.0)

## 2020-10-20 LAB — TROPONIN I (HIGH SENSITIVITY)
Troponin I (High Sensitivity): 9 ng/L (ref <18)
Troponin I (High Sensitivity): 9 ng/L (ref <18)

## 2020-10-20 LAB — APTT: aPTT: 36 seconds (ref 24–36)

## 2020-10-20 LAB — RESP PANEL BY RT-PCR (FLU A&B, COVID) ARPGX2
Influenza A by PCR: NEGATIVE
Influenza B by PCR: NEGATIVE
SARS Coronavirus 2 by RT PCR: NEGATIVE

## 2020-10-20 LAB — PROTIME-INR
INR: 1.5 — ABNORMAL HIGH (ref 0.8–1.2)
Prothrombin Time: 17.3 seconds — ABNORMAL HIGH (ref 11.4–15.2)

## 2020-10-20 LAB — TRIGLYCERIDES: Triglycerides: 125 mg/dL (ref <150)

## 2020-10-20 LAB — FIBRINOGEN: Fibrinogen: >800 mg/dL — ABNORMAL HIGH (ref 210–475)

## 2020-10-20 LAB — LACTATE DEHYDROGENASE: LDH: 158 U/L (ref 98–192)

## 2020-10-20 LAB — LACTIC ACID, PLASMA
Lactic Acid, Venous: 0.7 mmol/L (ref 0.5–1.9)
Lactic Acid, Venous: 0.6 mmol/L (ref 0.5–1.9)

## 2020-10-20 LAB — FERRITIN: Ferritin: 68 ng/mL (ref 24–336)

## 2020-10-20 LAB — C-REACTIVE PROTEIN: CRP: 11.1 mg/dL — ABNORMAL HIGH (ref <1.0)

## 2020-10-20 LAB — PROCALCITONIN: Procalcitonin: 0.12 ng/mL

## 2020-10-20 LAB — BRAIN NATRIURETIC PEPTIDE: B Natriuretic Peptide: 790 pg/mL — ABNORMAL HIGH (ref 0.0–100.0)

## 2020-10-20 MED ORDER — CHLORHEXIDINE GLUCONATE CLOTH 2 % EX PADS
6.0000 | MEDICATED_PAD | Freq: Every day | CUTANEOUS | Status: DC
  Administered 2020-10-20 – 2020-11-12 (×24): 6 via TOPICAL

## 2020-10-20 MED ORDER — SODIUM CHLORIDE 0.9 % IV SOLN
1.0000 g | Freq: Once | INTRAVENOUS | Status: AC
  Administered 2020-10-20: 1 g via INTRAVENOUS
  Filled 2020-10-20: qty 10

## 2020-10-20 MED ORDER — LORAZEPAM 2 MG/ML IJ SOLN
2.0000 mg | INTRAMUSCULAR | Status: DC | PRN
  Administered 2020-10-20 – 2020-11-10 (×16): 2 mg via INTRAVENOUS
  Filled 2020-10-20 (×16): qty 1

## 2020-10-20 MED ORDER — ACETAMINOPHEN 325 MG PO TABS
650.0000 mg | ORAL_TABLET | ORAL | Status: DC | PRN
  Administered 2020-10-23 – 2020-10-27 (×2): 650 mg via ORAL
  Filled 2020-10-20 (×2): qty 2

## 2020-10-20 MED ORDER — PROPOFOL 1000 MG/100ML IV EMUL
5.0000 ug/kg/min | INTRAVENOUS | Status: DC
  Administered 2020-10-20: 5 ug/kg/min via INTRAVENOUS
  Administered 2020-10-21 (×2): 20 ug/kg/min via INTRAVENOUS
  Administered 2020-10-21: 15 ug/kg/min via INTRAVENOUS
  Administered 2020-10-21: 20 ug/kg/min via INTRAVENOUS
  Administered 2020-10-22 – 2020-10-23 (×6): 30 ug/kg/min via INTRAVENOUS
  Administered 2020-10-23: 20 ug/kg/min via INTRAVENOUS
  Administered 2020-10-23 (×3): 30 ug/kg/min via INTRAVENOUS
  Administered 2020-10-24: 35 ug/kg/min via INTRAVENOUS
  Administered 2020-10-24: 40 ug/kg/min via INTRAVENOUS
  Administered 2020-10-24: 25 ug/kg/min via INTRAVENOUS
  Administered 2020-10-24 (×2): 30 ug/kg/min via INTRAVENOUS
  Administered 2020-10-24: 25 ug/kg/min via INTRAVENOUS
  Administered 2020-10-24 – 2020-10-25 (×2): 40 ug/kg/min via INTRAVENOUS
  Administered 2020-10-25 (×2): 35 ug/kg/min via INTRAVENOUS
  Filled 2020-10-20 (×4): qty 100
  Filled 2020-10-20: qty 200
  Filled 2020-10-20 (×8): qty 100
  Filled 2020-10-20 (×2): qty 200
  Filled 2020-10-20 (×5): qty 100
  Filled 2020-10-20: qty 200
  Filled 2020-10-20 (×4): qty 100
  Filled 2020-10-20: qty 200

## 2020-10-20 MED ORDER — SODIUM CHLORIDE 0.9% FLUSH
3.0000 mL | Freq: Two times a day (BID) | INTRAVENOUS | Status: DC
  Administered 2020-10-20 – 2020-10-22 (×5): 3 mL via INTRAVENOUS
  Administered 2020-10-23: 10 mL via INTRAVENOUS
  Administered 2020-10-24: 3 mL via INTRAVENOUS
  Administered 2020-10-24: 10 mL via INTRAVENOUS
  Administered 2020-10-25 – 2020-11-08 (×29): 3 mL via INTRAVENOUS
  Administered 2020-11-09: 10 mL via INTRAVENOUS
  Administered 2020-11-09 – 2020-11-12 (×5): 3 mL via INTRAVENOUS

## 2020-10-20 MED ORDER — FUROSEMIDE 10 MG/ML IJ SOLN
80.0000 mg | Freq: Two times a day (BID) | INTRAMUSCULAR | Status: DC
  Administered 2020-10-20 – 2020-10-22 (×4): 80 mg via INTRAVENOUS
  Filled 2020-10-20 (×4): qty 8

## 2020-10-20 MED ORDER — ONDANSETRON HCL 4 MG/2ML IJ SOLN: 4.0000 mg | Freq: Four times a day (QID) | INTRAMUSCULAR | Status: DC | PRN

## 2020-10-20 MED ORDER — FENTANYL CITRATE (PF) 100 MCG/2ML IJ SOLN
25.0000 ug | INTRAMUSCULAR | Status: DC | PRN
  Administered 2020-10-20: 50 ug via INTRAVENOUS
  Administered 2020-10-21 – 2020-11-05 (×15): 100 ug via INTRAVENOUS
  Administered 2020-11-05 – 2020-11-07 (×2): 50 ug via INTRAVENOUS
  Administered 2020-11-08 – 2020-11-12 (×8): 100 ug via INTRAVENOUS
  Filled 2020-10-20 (×27): qty 2

## 2020-10-20 MED ORDER — SODIUM CHLORIDE 0.9 % IV SOLN
500.0000 mg | Freq: Once | INTRAVENOUS | Status: AC
  Administered 2020-10-20: 500 mg via INTRAVENOUS
  Filled 2020-10-20: qty 500

## 2020-10-20 MED ORDER — SODIUM CHLORIDE 0.9 % IV SOLN: 250.0000 mL | INTRAVENOUS | Status: DC | PRN

## 2020-10-20 MED ORDER — SODIUM CHLORIDE 0.9% FLUSH
3.0000 mL | INTRAVENOUS | Status: DC | PRN
  Administered 2020-10-23 – 2020-11-07 (×2): 3 mL via INTRAVENOUS

## 2020-10-20 MED ORDER — FUROSEMIDE 10 MG/ML IJ SOLN
80.0000 mg | Freq: Once | INTRAMUSCULAR | Status: DC
  Filled 2020-10-20: qty 8

## 2020-10-20 MED ORDER — FUROSEMIDE 10 MG/ML IJ SOLN
80.0000 mg | Freq: Once | INTRAMUSCULAR | Status: AC
  Administered 2020-10-20: 80 mg via INTRAVENOUS

## 2020-10-20 NOTE — ED Triage Notes (Addendum)
Pt c/o shortness of breath that began a few days ago. Pt is on continuous O2 of 2.5L during the day and 4L at night. Pt's oxygen saturation was in the 60's on home O2 Tank. Pt was placed on hospital tank and titrated to 6L and O2 Sats rose to 77. Pt placed on non-rebreather with sats of 100

## 2020-10-20 NOTE — H&P (Signed)
History and Physical  Jack Cox WUJ:[id number] DOB: 1966-08-01 DOA: 10/20/2020   PCP: [name], MD   Patient coming from: Home  Chief Complaint: sob  HPI:  Jack Cox is a 54 y.o. male with medical history of diastolic CHF, atrial flutter, obstructive sleep apnea, hypertension, right bundle branch block presenting with shortness of breath for the last 2 to 3 days.  Unfortunately, the patient is encephalopathic at the time of my evaluation; therefore, history is limited.  Apparently, the patient went to see his nephrologist earlier on 10/20/2020.  He was noted to have oxygen saturation in the 60s.  As result, he was sent to the emergency department for further evaluation.  Initially, the patient was awake and conversant with oxygen saturation in the low 70s.  He was placed on a nonrebreather after which the patient became obtunded.  VBG showed pH 7.1 66/97/106 on 80%.  Subsequently, the patient was placed on the ventilator with PRVC, PEEP of 5, FiO2 70%.  After approximately 10 to 15 minutes on the ventilator, the patient remained somnolent, but awakened and was able to tell me his name.  He did state that he has not been taking his torsemide as directed.  In addition, the patient has been sleeping in a recliner for an unclear duration of time secondary to shortness of breath.  At baseline, the patient has a size 4 noncuffed tracheostomy.  He is on supplemental oxygen 2.5 L during the day and 4 L at nighttime. There is no reports of nausea, vomiting, diarrhea, chest pain, abdominal pain. In the emergency department, the patient was afebrile and hemodynamically stable with oxygen saturation 93-94% on the ventilator with FiO2 70%.  Chest x-ray showed left-sided pleural effusion with increased interstitial markings.  BNP 790.  PCT 0.12.  BMP showed a serum creatinine 2.43.  LFTs were unremarkable.  WBC 8.1, hemoglobin 14.5, platelets 253,000.  The patient was given ceftriaxone and  azithromycin.  He was also given furosemide 80 mg IV x1.  COVID-19 RT-PCR negative  Assessment/Plan: Acute on chronic respiratory failure with hypoxia and hypercarbia -Secondary to CHF in the setting of OSA -Currently placed on a ventilator -Vent settings PRVC, PEEP 5, FiO2 70% -Pulmonary consult  Acute on chronic diastolic CHF -9/56/2130 echo EF 55%, trivial TR -Repeat echo -Start IV furosemide 80 mg IV twice daily -Daily BMP -Accurate I's and O's -Daily weights  Atrial flutter -Continue apixaban if awake enough to tolerate p.o. -Start IV metoprolol until the patient is able to tolerate p.o.  CKD stage IV -Baseline creatinine 2.4-2.7 -Monitor with diuresis  Essential hypertension -Anticipate improvement with diuresis -Continue metoprolol  OSA -The patient has a tracheostomy placed in 2001 -He has a size 4 cuffless trach  Right bundle branch block -This has been chronic wound with review of the medical records  Morbid obesity -BMI 52.94       Past Medical History:  Diagnosis Date  . CKD (chronic kidney disease)   . Congestive heart failure (CHF) ([place])   . Essential hypertension   . Fatigue   . Gout   . Hypercholesteremia   . Insomnia   . Proteinuria   . Renal insufficiency   . Shortness of breath   . Tracheostomy dependence ([place])    History reviewed. No pertinent surgical history. Social History:  reports that he has never smoked. He has never used smokeless tobacco. He reports that he does not drink alcohol and does not use drugs.  Family History  Problem Relation Age of Onset  . Stomach cancer Mother   . Colon cancer Father      No Known Allergies   Prior to Admission medications   Medication Sig Start Date End Date Taking? Authorizing Provider  allopurinol (ZYLOPRIM) 300 MG tablet Take 300 mg by mouth daily.    [provider]  aspirin EC 81 MG tablet Take 81 mg by mouth daily.    [provider]  furosemide (LASIX) 40  MG tablet Take 40 mg by mouth.    [provider]  lisinopril (PRINIVIL,ZESTRIL) 40 MG tablet Take 40 mg by mouth daily.    [provider]  metoprolol succinate (TOPROL-XL) 50 MG 24 hr tablet Take 50 mg by mouth daily. Take with or immediately following a meal.    [provider]  mirtazapine (REMERON) 30 MG tablet Take 30 mg by mouth at bedtime.    [provider]    Review of Systems:  Unobtainable due to encephalopathy Physical Exam: Vitals:   10/20/20 1128 10/20/20 1200 10/20/20 1230 10/20/20 1330  BP: (!) 143/74 (!) 155/73 134/79 138/72  Pulse: (!) 56 85 (!) 57 (!) 59  Resp: 20 18 (!) 29 (!) 27  Temp: 98 F (36.7 C)     TempSrc: Oral     SpO2: 92% 97% 97% 96%  Weight:      Height:    5\' 7"  (1.702 m)   General:  A&O x 1, NAD, nontoxic, pleasant/cooperative Head/Eye: No conjunctival hemorrhage, no icterus, [place]/AT, No nystagmus ENT:  No icterus,  No thrush, good dentition, no pharyngeal exudate Neck:  No masses, no lymphadenpathy, no bruits CV:  RRR, no rub, no gallop, no S3 Lung:  Bilateral rales Abdomen: soft/NT, +BS, nondistended, no peritoneal signs Ext: No cyanosis, No rashes, No petechiae, No lymphangitis, 3+LE edema  Labs on Admission:  Basic Metabolic Panel: Recent Labs  Lab 10/20/20 1217  NA 139  K 4.5  CL 100  CO2 33*  GLUCOSE 97  BUN 61*  CREATININE 2.43*  CALCIUM 9.2   Liver Function Tests: Recent Labs  Lab 10/20/20 1217  AST 33  ALT 30  ALKPHOS 114  BILITOT 0.4  PROT 7.9  ALBUMIN 3.2*   No results for input(s): LIPASE, AMYLASE in the last 168 hours. No results for input(s): AMMONIA in the last 168 hours. CBC: Recent Labs  Lab 10/20/20 1217  WBC 8.1  NEUTROABS 6.2  HGB 14.5  HCT 49.8  MCV 101.8*  PLT 353   Coagulation Profile: Recent Labs  Lab 10/20/20 1259  INR 1.5*   Cardiac Enzymes: No results for input(s): CKTOTAL, CKMB, CKMBINDEX, TROPONINI in the last 168 hours. BNP: Invalid input(s):  POCBNP CBG: No results for input(s): GLUCAP in the last 168 hours. Urine analysis: No results found for: COLORURINE, APPEARANCEUR, LABSPEC, PHURINE, GLUCOSEU, HGBUR, BILIRUBINUR, KETONESUR, PROTEINUR, UROBILINOGEN, NITRITE, LEUKOCYTESUR Sepsis Labs: @LABRCNTIP (procalcitonin:4,lacticidven:4) ) Recent Results (from the past 240 hour(s))  Resp Panel by RT-PCR (Flu A&B, Covid) Nasopharyngeal Swab     Status: None   Collection Time: 10/20/20 11:43 AM   Specimen: Nasopharyngeal Swab; Nasopharyngeal(NP) swabs in vial transport medium  Result Value Ref Range Status   SARS Coronavirus 2 by RT PCR NEGATIVE NEGATIVE Final    Comment: (NOTE) SARS-CoV-2 target nucleic acids are NOT DETECTED.  The SARS-CoV-2 RNA is generally detectable in upper respiratory specimens during the acute phase of infection. The lowest concentration of SARS-CoV-2 viral copies this assay can detect is  138 copies/mL. A negative result does not preclude SARS-Cov-2 infection and should not be used as the sole basis for treatment or other patient management decisions. A negative result may occur with  improper specimen collection/handling, submission of specimen other than nasopharyngeal swab, presence of viral mutation(s) within the areas targeted by this assay, and inadequate number of viral copies(<138 copies/mL). A negative result must be combined with clinical observations, patient history, and epidemiological information. The expected result is Negative.  Fact Sheet for Patients:  EntrepreneurPulse.com.au  Fact Sheet for Healthcare Providers:  IncredibleEmployment.be  This test is no t yet approved or cleared by the [place] FDA and  has been authorized for detection and/or diagnosis of SARS-CoV-2 by FDA under an Emergency Use Authorization (EUA). This EUA will remain  in effect (meaning this test can be used) for the duration of the COVID-19 declaration under Section  564(b)(1) of the Act, 21 U.S.C.section 360bbb-3(b)(1), unless the authorization is terminated  or revoked sooner.       Influenza A by PCR NEGATIVE NEGATIVE Final   Influenza B by PCR NEGATIVE NEGATIVE Final    Comment: (NOTE) The Xpert Xpress SARS-CoV-2/FLU/RSV plus assay is intended as an aid in the diagnosis of influenza from Nasopharyngeal swab specimens and should not be used as a sole basis for treatment. Nasal washings and aspirates are unacceptable for Xpert Xpress SARS-CoV-2/FLU/RSV testing.  Fact Sheet for Patients: EntrepreneurPulse.com.au  Fact Sheet for Healthcare Providers: IncredibleEmployment.be  This test is not yet approved or cleared by the [place] FDA and has been authorized for detection and/or diagnosis of SARS-CoV-2 by FDA under an Emergency Use Authorization (EUA). This EUA will remain in effect (meaning this test can be used) for the duration of the COVID-19 declaration under Section 564(b)(1) of the Act, 21 U.S.C. section 360bbb-3(b)(1), unless the authorization is terminated or revoked.  Performed at [facility], [address], [place], [place] [address]   Blood culture (routine x 2)     Status: None (Preliminary result)   Collection Time: 10/20/20 12:59 PM   Specimen: BLOOD RIGHT ARM  Result Value Ref Range Status   Specimen Description BLOOD RIGHT ARM  Final   Special Requests   Final    BOTTLES DRAWN AEROBIC AND ANAEROBIC Blood Culture adequate volume Performed at [facility], [address], [place], [place] [address]    Culture PENDING  Incomplete   Report Status PENDING  Incomplete     Radiological Exams on Admission: DG Chest Port 1 View  Result Date: 10/20/2020 CLINICAL DATA:  Questionable sepsis EXAM: PORTABLE CHEST 1 VIEW COMPARISON:  Chest x-ray 06/12/2018 report without images. FINDINGS: Tracheostomy tube terminates approximately 7.5 cm above the carina. The heart size and mediastinal  contours are not well visualized. Bilateral hilar vasculature prominence. Aortic arch calcifications. Silhouetting off of the left hemidiaphragm and left cardiac border. Partial silhouetting off the right cardiac border and right hemidiaphragm. Increased interstitial markings. Likely at least trace to small volume left pleural effusion. No pneumothorax. No acute osseous abnormality. IMPRESSION: 1. Left lung opacity and likely at least trace to small volume left pleural effusion with silhouetting off of the left diaphragm and heart border. Concern for infection/inflammation. 2. Right lower lobe and middle lobe airspace opacity could represent a combination of atelectasis, infection, inflammation. 3. At least mild pulmonary edema. 4. Consider chest x-ray PA and lateral view for further evaluation. Electronically Signed   By: Iven Finn M.D.   On: 10/20/2020 12:26    EKG:  Independently reviewed. Sinus, RBBB    Time spent:60 minutes Code Status:   FULL Family Communication:  No Family at bedside Disposition Plan: expect 2-3 day hospitalization Consults called: pulmonary DVT Prophylaxis: apixaban  [name], DO  [facility] Pager [phone]  If 7PM-7AM, please contact night-coverage [email or web address] Password [email or web address] 10/20/2020, 3:07 PM

## 2020-10-20 NOTE — ED Notes (Signed)
Date and time results received: 10/20/20  (use smartphrase ".now" to insert current time)  Test: PH & PCO2 Critical Value: PH-7.166 PCO2-97.8  Name of Provider Notified: Brittni PA   Orders Received? Or Actions Taken?:

## 2020-10-20 NOTE — Progress Notes (Signed)
RT with assistance of Respiratory Therapist Cordelia Pen changed trach to 4 cuffed trach, no complications noted. Patient was placed on ventilator. RT will continue to monitor and assess.

## 2020-10-20 NOTE — Consult Note (Signed)
NAME:  Jack Cox, MRN:  [id number], DOB:  [birth year], LOS: 0 ADMISSION DATE:  10/20/2020, CONSULTATION DATE:  10/20/20  REFERRING MD:  [facility], [facility]  CHIEF COMPLAINT:  Resp distress   Brief History   54 yo never smoker with MO complicated by severe OSA req trach 2001 last eval by [facility] 2018 also followed by ENT admit with increasing sob x 48 h pta s fever /purulent sputum and found to have severe hypercabic hypoxemic resp failure on arrival to ER am 12/15 so placed on vent and PCCM service requested to eval.   History of present illness   54 y.o. male with medical history of diastolic CHF, atrial flutter, obstructive sleep apnea, hypertension, right bundle branch block presenting with shortness of breath for the last 2 to 3 days.    Apparently, the patient went to see his nephrologist earlier on 10/20/2020.  He was noted to have oxygen saturation in the 60s.  As result, he was sent to the emergency department for further evaluation.  Initially, the patient was awake and conversant with oxygen saturation in the low 70s.  He was placed on a nonrebreather after which the patient became obtunded.  VBG showed pH 7.1 66/97/106 on 80%.  Subsequently, the patient was placed on the ventilator with PRVC, PEEP of 5, FiO2 70%.  After approximately 10 to 15 minutes on the ventilator, the patient remained somnolent, but awakened and was able to say his name.  He did state that he has not been taking his torsemide as directed.  In addition, the patient has been sleeping in a recliner for an unclear duration of time secondary to shortness of breath.  At baseline, the patient has a size 4 noncuffed tracheostomy.  He is on supplemental oxygen 2.5 L during the day and 4 L at nighttime. There is no reports of nausea, vomiting, diarrhea, chest pain, abdominal pain. In the emergency department, the patient was afebrile and hemodynamically stable with oxygen saturation 93-94% on the ventilator with FiO2 70%.  Chest x-ray  showed left-sided pleural effusion with increased interstitial markings.  BNP 790.  PCT 0.12.  BMP showed a serum creatinine 2.43.  LFTs were unremarkable.  WBC 8.1, hemoglobin 14.5, platelets 253,000.  The patient was given ceftriaxone and azithromycin.  He was also given furosemide 80 mg IV x1.  COVID-19 RT-PCR negative   Past Medical History  CKD Proteinuria, Congestive heart failure   Essential hypertension  Gout Hypercholesteremia   Tracheostomy dependence    Significant Hospital Events      Consults:  PCCM pm 12/15  Procedures:  New cuffed trach 12/15   Significant Diagnostic Tests:     Micro Data:  Resp viral panel  12/15 neg   Antimicrobials:   pak 12/15  >>> Rocephin 12/15  >>>   Scheduled Meds: Continuous Infusions: . azithromycin     PRN Meds:.   Interim history/subjective:  Placed on vent and variably arousable but clearly not ventilating well with obvious air leak with uncuffed trach  Objective   Blood pressure 138/72, pulse (!) 59, temperature 98 F (36.7 C), temperature source Oral, resp. rate (!) 27, height 5\' 7"  (1.702 m), weight (!) 153.3 kg, SpO2 96 %.    Vent Mode: PRVC FiO2 (%):  [80 %-100 %] 100 % Set Rate:  [20 bmp] 20 bmp Vt Set:  [530 mL] 530 mL PEEP:  [5 cmH20] 5 cmH20  No intake or output data in the 24 hours ending 10/20/20 [place]  10/20/20 1124  Weight: (!) 153.3 kg    Examination: General: massively obese chronically ill appearing HENT: clear/ large tongue Lungs: distant rhonchi bilaterally Cardiovascular: RRR distant S1S2 Abdomen: massively obese/ very poor excursion Extremities: severe pitting edema both LEs      I personally reviewed images and agree with radiology impression as follows:  CXR:   12/15 1. Left lung opacity and likely at least trace to small volume left pleural effusion with silhouetting off of the left diaphragm and heart border. Concern for infection/inflammation. 2. Right lower  lobe and middle lobe airspace opacity could represent a combination of atelectasis, infection, inflammation. 3. At least mild pulmonary edema.  Resolved Hospital Problem list     Assessment & Plan:  1) acute on chronic hypoxemic/hypercarbic resp falure prob OHS  with vol overload r/o CAP >> priority now is establishing control of airway with adequate pos pressure in supine position, discussed trach change with RT   2)  CRI with vol overload on ACEi PTA - hold acei for now and attempt diuresis/ nephrology input prn   3)  Abn cxr ? All fluid >>> Rx empirically for CAP with labs pending per [facility] though note PCT not elevated so don't feel strongly about this   4) severe MO the underlying problem here and not easily addressed as inpt with high risk OHS/ chronic vent dep and DVT/ PE   Best practice (evaluated daily)   Diet: per [facility] Pain/Anxiety/Delirium protocol (if indicated): per [facility] VAP protocol (if indicated):   DVT prophylaxis: per [facility] GI prophylaxis: per [facility] Glucose control: per [facility] Mobility: bed rest for now  last date of multidisciplinary goals of care:  Per [facility]   Code Status: full  Disposition: ICU [facility], no need or transfer at this point   Labs   CBC: Recent Labs  Lab 10/20/20 1217  WBC 8.1  NEUTROABS 6.2  HGB 14.5  HCT 49.8  MCV 101.8*  PLT 720    Basic Metabolic Panel: Recent Labs  Lab 10/20/20 1217  NA 139  K 4.5  CL 100  CO2 33*  GLUCOSE 97  BUN 61*  CREATININE 2.43*  CALCIUM 9.2   GFR: Estimated Creatinine Clearance: 49.6 mL/min (A) (by C-G formula based on SCr of 2.43 mg/dL (H)). Recent Labs  Lab 10/20/20 1217  PROCALCITON 0.12  WBC 8.1  LATICACIDVEN 0.7    Liver Function Tests: Recent Labs  Lab 10/20/20 1217  AST 33  ALT 30  ALKPHOS 114  BILITOT 0.4  PROT 7.9  ALBUMIN 3.2*   No results for input(s): LIPASE, AMYLASE in the last 168 hours. No results for input(s): AMMONIA in the last 168 hours.  ABG    Component  Value Date/Time   HCO3 25.5 10/20/2020 1303   O2SAT 95.9 10/20/2020 1303     Coagulation Profile: Recent Labs  Lab 10/20/20 1259  INR 1.5*    Cardiac Enzymes: No results for input(s): CKTOTAL, CKMB, CKMBINDEX, TROPONINI in the last 168 hours.  HbA1C: No results found for: HGBA1C  CBG: No results for input(s): GLUCAP in the last 168 hours.     Past Medical History  He,  has a past medical history of CKD (chronic kidney disease), Congestive heart failure (CHF) ([place]), Essential hypertension, Fatigue, Gout, Hypercholesteremia, Insomnia, Proteinuria, Renal insufficiency, Shortness of breath, and Tracheostomy dependence ([place]).   Surgical History   History reviewed. No pertinent surgical history.   Social History   reports that he has never smoked. He has never  used smokeless tobacco. He reports that he does not drink alcohol and does not use drugs.   Family History   His family history includes Colon cancer in his father; Stomach cancer in his mother.   Allergies No Known Allergies   Home Medications  Prior to Admission medications   Medication Sig Start Date End Date Taking? Authorizing Provider  allopurinol (ZYLOPRIM) 300 MG tablet Take 300 mg by mouth daily.    [provider]  aspirin EC 81 MG tablet Take 81 mg by mouth daily.    [provider]  furosemide (LASIX) 40 MG tablet Take 40 mg by mouth.    [provider]  lisinopril (PRINIVIL,ZESTRIL) 40 MG tablet Take 40 mg by mouth daily.    [provider]  metoprolol succinate (TOPROL-XL) 50 MG 24 hr tablet Take 50 mg by mouth daily. Take with or immediately following a meal.    [provider]  mirtazapine (REMERON) 30 MG tablet Take 30 mg by mouth at bedtime.    [provider]      The patient is critically ill with multiple organ systems failure and requires high complexity decision making for assessment and support, frequent evaluation and titration of  therapies, application of advanced monitoring technologies and extensive interpretation of multiple databases. Critical Care Time devoted to patient care services described in this note is 45 minutes.    Christinia Gully, MD [facility] [place] [phone]   After 7:00 pm call Elink  [phone]

## 2020-10-20 NOTE — ED Notes (Signed)
Respiratory called for BIPAP.

## 2020-10-20 NOTE — ED Notes (Signed)
Multiple attempts to reach pts wife. Phone number in chart is not correct.

## 2020-10-20 NOTE — Progress Notes (Signed)
eLink Physician-Brief Progress Note Patient Name: Jack Cox DOB: 10-Sep-1966 MRN: [id number]   Date of Service  10/20/2020  HPI/Events of Note  ABG performed several hours after uncuffed trach was switched out to a cuffed trach showed 7.21/84/78/26/BE +5.  End-expiratory showed auto-PEEP was 19 cm H2O. He does have a prolonged expiratory limb. Current Ti is 0.8s.  Current vent settings: Volume A/C 400x20, 5, 80%.   eICU Interventions  Will make step-wise adjustments to PEEP to help address auto-PEEP. For now, increase PEEP from 5 to 10 cm H2O. Repeat ABG at ~2300 hrs. Will also start a low dose of propofol to help keep him highly synchronous with ventilator.     Intervention Category Major Interventions: Respiratory failure - evaluation and management;Acid-Base disturbance - evaluation and management  Charlott Rakes 10/20/2020, 7:44 PM

## 2020-10-20 NOTE — ED Provider Notes (Signed)
[facility] EMERGENCY DEPARTMENT Provider Note   CSN: [id number] Arrival date & time: 10/20/20  1114     History Chief Complaint  Patient presents with  . Shortness of Breath    Jack Cox is a 54 y.o. male with history significant for CKD, CHF, hypertension, trach in place for OSA, chronic respiratory failure on 2.5 L via nasal cannula who presents for evaluation of shortness of breath.  Has had some worsening shortness of breath over the last week worsening over the last 3 days. Sleeps in recliner or 4 pillows in bed. Has noted increase in weight however unable to quantify. Vaccinated against [place].  Has had cough productive of yellow sputum.  No emesis.  Went to nephrology today and was noted to be hypoxic into the low 70s on his home O2.  Sent here to the ED for further evaluation. Chronically anticoagulated for Aflutter. Has not missed any doses. No fever, emesis, CP, increased unilateral swelling. Intermittently compliant with Torsemide.  Lives with Brother  Level 5 Caveat- Respiratory failure  HPI     Past Medical History:  Diagnosis Date  . CKD (chronic kidney disease)   . Congestive heart failure (CHF) ([place])   . Essential hypertension   . Fatigue   . Gout   . Hypercholesteremia   . Insomnia   . Proteinuria   . Renal insufficiency   . Shortness of breath   . 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 Acute on chronic respiratory failure ([place]) 10/20/2020  . Acute on chronic respiratory failure with hypoxia and hypercapnia ([place]) 10/20/2020  . Acute on chronic diastolic CHF (congestive heart failure) ([place]) 10/20/2020  . Morbid obesity with BMI of 50.0-59.9, adult ([place]) 10/20/2020  . CKD (chronic kidney disease) stage 4, GFR 15-29 ml/min (HCC) 10/20/2020  . Chronic respiratory failure with hypoxia ([place]) 02/05/2018  . OSA (obstructive sleep apnea) 10/23/2017  . Tracheostomy dependent ([place]) 10/23/2017    History  reviewed. No pertinent surgical history.     Family History  Problem Relation Age of Onset  . Stomach cancer Mother   . Colon cancer Father     Social History   Tobacco Use  . Smoking status: Never Smoker  . Smokeless tobacco: Never Used  Vaping Use  . Vaping Use: Never used  Substance Use Topics  . Alcohol use: No  . Drug use: No    Home Medications Prior to Admission medications   Medication Sig Start Date End Date Taking? Authorizing Provider  allopurinol (ZYLOPRIM) 300 MG tablet Take 300 mg by mouth daily.    [provider]  aspirin EC 81 MG tablet Take 81 mg by mouth daily.    [provider]  furosemide (LASIX) 40 MG tablet Take 40 mg by mouth.    [provider]  lisinopril (PRINIVIL,ZESTRIL) 40 MG tablet Take 40 mg by mouth daily.    [provider]  metoprolol succinate (TOPROL-XL) 50 MG 24 hr tablet Take 50 mg by mouth daily. Take with or immediately following a meal.    [provider]  mirtazapine (REMERON) 30 MG tablet Take 30 mg by mouth at bedtime.    [provider]    Allergies    Patient has no known allergies.  Review of Systems   Review of Systems  Constitutional: Positive for activity change, appetite change and fatigue.  HENT: Negative.   Respiratory: Positive for cough and shortness of breath.  Negative for apnea, choking, chest tightness, wheezing and stridor.   Cardiovascular: Negative.   Gastrointestinal: Negative.   Genitourinary: Negative.   Musculoskeletal: Negative.   Skin: Negative.   Neurological: Positive for weakness (Generalized).  All other systems reviewed and are negative.   Physical Exam Updated Vital Signs BP 123/79   Pulse (!) 56   Temp 98 F (36.7 C) (Oral)   Resp (!) 22   Ht 5\' 7"  (1.702 m)   Wt (!) 153.3 kg   SpO2 93%   BMI 52.94 kg/m   Physical Exam Vitals and nursing note reviewed.  Constitutional:      General: He is in acute distress.     Appearance:  He is well-nourished. He is obese. He is ill-appearing and toxic-appearing. He is not diaphoretic.  HENT:     Head: Normocephalic and atraumatic.     Mouth/Throat:     Mouth: Mucous membranes are moist.  Eyes:     Pupils: Pupils are equal, round, and reactive to light.  Neck:     Comments: Uncuffed trach collar in place without discharge Cardiovascular:     Rate and Rhythm: Normal rate and regular rhythm.     Pulses: Normal pulses.     Heart sounds: Normal heart sounds.  Pulmonary:     Effort: Tachypnea, accessory muscle usage and respiratory distress present.     Comments: Coarse breath sounds bilaterally, difficult to assess due to body habitus Chest:     Comments: Equal rise and fall to chest Abdominal:     General: Bowel sounds are normal. There is no distension.     Palpations: Abdomen is soft.     Tenderness: There is no abdominal tenderness.     Comments: Protuberent abdomen, soft, non-tender.Thicken skin changes to lower abd pannus  Musculoskeletal:        General: Normal range of motion.     Cervical back: Full passive range of motion without pain, normal range of motion and neck supple.     Right lower leg: No tenderness. Edema present.     Left lower leg: No tenderness. Edema present.     Comments: Significant 3+ woody edema to bilateral lower extremities to knees.  Skin:    General: Skin is warm and dry.     Capillary Refill: Capillary refill takes 2 to 3 seconds.     Findings: Rash present.     Comments: Moist, mild macerated skin underneath abdominal pannus and bilateral inguinal creases. Skin thickening to anterior abd panus with erythema.  Neurological:     General: No focal deficit present.     Mental Status: He is alert and oriented to person, place, and time.  Psychiatric:        Mood and Affect: Mood and affect normal.    ED Results / Procedures / Treatments   Labs (all labs ordered are listed, but only abnormal results are displayed) Labs Reviewed   COMPREHENSIVE METABOLIC PANEL - Abnormal; Notable for the following components:      Result Value   CO2 33 (*)    BUN 61 (*)    Creatinine, Ser 2.43 (*)    Albumin 3.2 (*)    GFR, Estimated 31 (*)    All other components within normal limits  CBC WITH DIFFERENTIAL/PLATELET - Abnormal; Notable for the following components:   MCV 101.8 (*)    MCHC 29.1 (*)    nRBC 0.4 (*)    All other components within normal limits  URINALYSIS,  ROUTINE W REFLEX MICROSCOPIC - Abnormal; Notable for the following components:   APPearance HAZY (*)    Glucose, UA 50 (*)    Protein, ur >=300 (*)    All other components within normal limits  BRAIN NATRIURETIC PEPTIDE - Abnormal; Notable for the following components:   B Natriuretic Peptide 790.0 (*)    All other components within normal limits  BLOOD GAS, VENOUS - Abnormal; Notable for the following components:   pH, Ven 7.166 (*)    pCO2, Ven 97.8 (*)    pO2, Ven 106.0 (*)    Acid-Base Excess 5.8 (*)    All other components within normal limits  C-REACTIVE PROTEIN - Abnormal; Notable for the following components:   CRP 11.1 (*)    All other components within normal limits  FIBRINOGEN - Abnormal; Notable for the following components:   Fibrinogen >800 (*)    All other components within normal limits  PROTIME-INR - Abnormal; Notable for the following components:   Prothrombin Time 17.3 (*)    INR 1.5 (*)    All other components within normal limits  RESP PANEL BY RT-PCR (FLU A&B, COVID) ARPGX2  CULTURE, BLOOD (ROUTINE X 2)  URINE CULTURE  CULTURE, BLOOD (ROUTINE X 2)  LACTIC ACID, PLASMA  LACTIC ACID, PLASMA  PROCALCITONIN  LACTATE DEHYDROGENASE  FERRITIN  TRIGLYCERIDES  APTT  I-STAT CHEM 8, ED  TROPONIN I (HIGH SENSITIVITY)  TROPONIN I (HIGH SENSITIVITY)   EKG None  Radiology DG Chest Port 1 View  Result Date: 10/20/2020 CLINICAL DATA:  Questionable sepsis EXAM: PORTABLE CHEST 1 VIEW COMPARISON:  Chest x-ray 06/12/2018 report  without images. FINDINGS: Tracheostomy tube terminates approximately 7.5 cm above the carina. The heart size and mediastinal contours are not well visualized. Bilateral hilar vasculature prominence. Aortic arch calcifications. Silhouetting off of the left hemidiaphragm and left cardiac border. Partial silhouetting off the right cardiac border and right hemidiaphragm. Increased interstitial markings. Likely at least trace to small volume left pleural effusion. No pneumothorax. No acute osseous abnormality. IMPRESSION: 1. Left lung opacity and likely at least trace to small volume left pleural effusion with silhouetting off of the left diaphragm and heart border. Concern for infection/inflammation. 2. Right lower lobe and middle lobe airspace opacity could represent a combination of atelectasis, infection, inflammation. 3. At least mild pulmonary edema. 4. Consider chest x-ray PA and lateral view for further evaluation. Electronically Signed   By: Iven Finn M.D.   On: 10/20/2020 12:26   Procedures Procedures (including critical care time)  Medications Ordered in ED Medications  cefTRIAXone (ROCEPHIN) 1 g in sodium chloride 0.9 % 100 mL IVPB (0 g Intravenous Stopped 10/20/20 1556)  azithromycin (ZITHROMAX) 500 mg in sodium chloride 0.9 % 250 mL IVPB (0 mg Intravenous Stopped 10/20/20 1607)  furosemide (LASIX) injection 80 mg (80 mg Intravenous Given 10/20/20 1312)    ED Course  I have reviewed the triage vital signs and the nursing notes.  Pertinent labs & imaging results that were available during my care of the patient were reviewed by me and considered in my medical decision making (see chart for details).  Patient with known history of chronic hypoxic respiratory failures on 2 L via oxygen during the day, 6 L at night through trach collar for OSA presents for evaluation of worsening shortness of breath.  On arrival respiratory distress, tachypnea, increased work of breathing, placed on  nonrebreather for hypoxia to 70% on his home oxygen.  Does admit to cough as well  as increasing weight however he is unable to quantify.  He has normal mentation currently.  Does have significant woody edema to his bilateral lower extremities however this has been present on his outpatient notes where he is currently followed by [facility].  CKD however not on dialysis.  We will plan on labs, imaging and reassess  Labs and imaging personally reviewed and interpreted:  CBC without leukocytosis Metabolic panel with CO2 of 33, creatinine 2.43 Trop 9 CRP 11.1 VBG with PH 7.166, pCO2 97.8 COVID negative Lactic 0.7 BC pending BNP 790, no prior to compare  Procalcitonin 0.12  Patient increasingly confused on Non-rebreather. Respiratory called for biPAP. Patient with acidosis and elevated PCO2 on VBG.  Uncuffed trach collar replaced by cuffed trach collar.  Respiratory recommends vent settings versus BiPAP.  Started on Lasix and ABX. Hold on IVF given appear fluid overloaded.  Patient placed on vent setting through trach collar. Sleepy however arousable to loud voice.  Patient critically ill.  Will need admission for further management.  Feel patient's worsening hypoxic and hypercarbic respiratory failure likely multifactorial from possible fluid overload from CHF and possible pneumonia.  Unfortunately patient cannot lay flat for CT scan. His Covid test was negative here.  He is anticoagulated chronically and denies missing doses have low suspicion for PE.  His EKG and troponins are reassuring have low suspicion for ACS.  Normotensive denies any chest pain or back pain.  I low suspicion for dissection. He does not appear septic at this time. Normal lactic --low suspicion for shock.  CONSULT with Dr. Carles Collet with [facility], place consult to critical care to see if they feel patient can stay here with pulmonology/CC rounding versus transfer to [facility].  CONSULT with Dr. Melvyn Novas with [facility] who recommends patient can  stay here at [facility] in ICU.  Patient seen and eval by attending, Dr. Reather Converse who agrees with above treatment, plan and disposition.    MDM Rules/Calculators/A&P                          Final Clinical Impression(s) / ED Diagnoses Final diagnoses:  Acute respiratory failure with hypoxia and hypercapnia ([place])  Community acquired pneumonia, unspecified laterality  Acute on chronic congestive heart failure, unspecified heart failure type [facility])    Rx / DC Orders ED Discharge Orders    None       Danah Reinecke A, PA-C 10/20/20 1609    Elnora Morrison, MD 10/23/20 0011

## 2020-10-21 ENCOUNTER — Inpatient Hospital Stay (HOSPITAL_COMMUNITY): Payer: Medicare Other

## 2020-10-21 DIAGNOSIS — Z515 Encounter for palliative care: Secondary | ICD-10-CM

## 2020-10-21 DIAGNOSIS — Z7189 Other specified counseling: Secondary | ICD-10-CM

## 2020-10-21 DIAGNOSIS — I5031 Acute diastolic (congestive) heart failure: Secondary | ICD-10-CM

## 2020-10-21 DIAGNOSIS — I509 Heart failure, unspecified: Secondary | ICD-10-CM

## 2020-10-21 DIAGNOSIS — G4733 Obstructive sleep apnea (adult) (pediatric): Secondary | ICD-10-CM

## 2020-10-21 DIAGNOSIS — Z6841 Body Mass Index (BMI) 40.0 and over, adult: Secondary | ICD-10-CM

## 2020-10-21 LAB — BASIC METABOLIC PANEL
Anion gap: 8 (ref 5–15)
BUN: 66 mg/dL — ABNORMAL HIGH (ref 6–20)
CO2: 32 mmol/L (ref 22–32)
Calcium: 8.8 mg/dL — ABNORMAL LOW (ref 8.9–10.3)
Chloride: 99 mmol/L (ref 98–111)
Creatinine, Ser: 2.76 mg/dL — ABNORMAL HIGH (ref 0.61–1.24)
GFR, Estimated: 26 mL/min — ABNORMAL LOW (ref >60)
Glucose, Bld: 88 mg/dL (ref 70–99)
Potassium: 4.2 mmol/L (ref 3.5–5.1)
Sodium: 139 mmol/L (ref 135–145)

## 2020-10-21 LAB — BLOOD GAS, ARTERIAL
Acid-Base Excess: 6.2 mmol/L — ABNORMAL HIGH (ref 0.0–2.0)
Bicarbonate: 27.6 mmol/L (ref 20.0–28.0)
Drawn by: 21310
FIO2: 80
MECHVT: 400 mL
O2 Saturation: 93.6 %
PEEP: 10 cmH2O
Patient temperature: 36.2
RATE: 20 resp/min
pCO2 arterial: 79.3 mmHg (ref 32.0–48.0)
pH, Arterial: 7.242 — ABNORMAL LOW (ref 7.350–7.450)
pO2, Arterial: 80 mmHg — ABNORMAL LOW (ref 83.0–108.0)

## 2020-10-21 LAB — URINE CULTURE: Culture: NO GROWTH

## 2020-10-21 LAB — STREP PNEUMONIAE URINARY ANTIGEN: Strep Pneumo Urinary Antigen: NEGATIVE

## 2020-10-21 LAB — MRSA PCR SCREENING: MRSA by PCR: NEGATIVE

## 2020-10-21 LAB — HIV ANTIBODY (ROUTINE TESTING W REFLEX): HIV Screen 4th Generation wRfx: NONREACTIVE

## 2020-10-21 LAB — ECHOCARDIOGRAM COMPLETE
Area-P 1/2: 2.64 cm2
Height: 68 in
S' Lateral: 2.03 cm
Weight: 5213.44 oz

## 2020-10-21 LAB — TRIGLYCERIDES: Triglycerides: 130 mg/dL (ref <150)

## 2020-10-21 MED ORDER — NOREPINEPHRINE 4 MG/250ML-% IV SOLN
INTRAVENOUS | Status: AC
  Filled 2020-10-21: qty 250

## 2020-10-21 MED ORDER — NOREPINEPHRINE 4 MG/250ML-% IV SOLN
0.0000 ug/min | INTRAVENOUS | Status: DC
  Administered 2020-10-21: 2 ug/min via INTRAVENOUS
  Administered 2020-10-21: 12 ug/min via INTRAVENOUS
  Filled 2020-10-21: qty 250

## 2020-10-21 MED ORDER — SODIUM CHLORIDE 0.9 % IV SOLN
250.0000 mL | INTRAVENOUS | Status: DC
  Administered 2020-11-02: 250 mL via INTRAVENOUS

## 2020-10-21 NOTE — Progress Notes (Signed)
PC02 came back 79.3  APP notified via [email or web address] Awaiting orders at this time. Pt is vented with trach. Also reported his decline in bp which has progressively gotten softer due to increasing his propofol.

## 2020-10-21 NOTE — Progress Notes (Signed)
Initial Nutrition Assessment  DOCUMENTATION CODES:   Morbid obesity  INTERVENTION:  Recommend initiate Vital High Protein @ 40 ml/hr via OGT.  Add ProSource TF- 90 ml -TID via OGT  Tube feeding regimen provides 1200 kcal, 150 grams of protein, and 802 ml of H2O.  Sedation provides : 464 kcal. Total kcal-1664 kcal (100% est energy and protein needs met).  NUTRITION DIAGNOSIS:   Inadequate oral intake related to acute illness (respiratory failure) as evidenced by NPO status.   GOAL:  Provide needs based on ASPEN/SCCM guidelines    MONITOR:   Vent status,Labs,TF tolerance,I & O's,Skin  REASON FOR ASSESSMENT:   Ventilator    ASSESSMENT: Patient is a 54 yo male with history of CKD, CHF. GOUT, Hypertension and Tracheostomy dependence. Chest x-ray findings- left side pleural effusion.   Patient is currently intubated on ventilator support due to respiratory failure 12/15. New cuffed trach 12/15.  MV: 9.8  L/min Temp (24hrs), Avg:97.2 F (36.2 C), Min:96.5 F (35.8 C), Max:98.1 F (36.7 C)  Propofol: 17.59  ml/hr (464 kcal lipids q 24 hr @ current rate)   Medications reviewed and include: Lasix, Levophed.  Labs: BMP Latest Ref Rng & Units 10/21/2020 10/20/2020  Glucose 70 - 99 mg/dL 88 97  BUN 6 - 20 mg/dL 66(H) 61(H)  Creatinine 0.61 - 1.24 mg/dL 2.76(H) 2.43(H)  Sodium 135 - 145 mmol/L 139 139  Potassium 3.5 - 5.1 mmol/L 4.2 4.5  Chloride 98 - 111 mmol/L 99 100  CO2 22 - 32 mmol/L 32 33(H)  Calcium 8.9 - 10.3 mg/dL 8.8(L) 9.2     NUTRITION - FOCUSED PHYSICAL EXAM: Deferred to follow up  Diet Order:   Diet Order            Diet NPO time specified Except for: Sips with Meds  Diet effective now                 EDUCATION NEEDS:  Not appropriate for education at this time  Skin:  Skin Assessment: Reviewed RN Assessment  Last BM:  unknown  Height:   Ht Readings from Last 1 Encounters:  10/20/20 _0  (1.727 m)    Weight:   Wt Readings from Last  1 Encounters:  10/21/20 (!) 147.8 kg    Ideal Body Weight:   70 kg  BMI:  Body mass index is 49.54 kg/m.  Estimated Nutritional Needs:   Kcal:  5726-2035  Protein:  140-154  Fluid:  < 2 liters   Colman Cater MS,RD,CSG,LDN Pager: Shea Evans

## 2020-10-21 NOTE — Progress Notes (Signed)
NAME:  Jack Cox, MRN:  [id number], DOB:  [birth year], LOS: 1 ADMISSION DATE:  10/20/2020, CONSULTATION DATE:  10/20/20  REFERRING MD:  Tat, [facility]  CHIEF COMPLAINT:  Resp distress   Brief History   54 yo never smoker with MO complicated by severe OSA req trach 2001 last eval by [facility] 2018 also followed by ENT admit with increasing sob x 48 h pta s fever /purulent sputum and found to have severe hypercabic hypoxemic resp failure on arrival to ER am 12/15 so placed on vent and PCCM service requested to eval.   Wife reported pt not able to lie back on regular settings for 02/cpap x > 1 one week PTA assoc with acute on chronic leg swelling  History of present illness   54 y.o. male with medical history of diastolic CHF, atrial flutter, obstructive sleep apnea, hypertension, right bundle branch block presenting with shortness of breath for the last 2 to 3 days.    Apparently, the patient went to see his nephrologist earlier on 10/20/2020.  He was noted to have oxygen saturation in the 60s.  As result, he was sent to the emergency department for further evaluation.  Initially, the patient was awake and conversant with oxygen saturation in the low 70s.  He was placed on a nonrebreather after which the patient became obtunded.  VBG showed pH 7.1 66/97/106 on 80%.  Subsequently, the patient was placed on the ventilator with PRVC, PEEP of 5, FiO2 70%.  After approximately 10 to 15 minutes on the ventilator, the patient remained somnolent, but awakened and was able to say his name.  He did state that he has not been taking his torsemide as directed.  In addition, the patient has been sleeping in a recliner for an unclear duration of time secondary to shortness of breath.  At baseline, the patient has a size 4 noncuffed tracheostomy.  He is on supplemental oxygen 2.5 L during the day and 4 L at nighttime. There is no reports of nausea, vomiting, diarrhea, chest pain, abdominal pain. In the emergency  department, the patient was afebrile and hemodynamically stable with oxygen saturation 93-94% on the ventilator with FiO2 70%.  Chest x-ray showed left-sided pleural effusion with increased interstitial markings.  BNP 790.  PCT 0.12.  BMP showed a serum creatinine 2.43.  LFTs were unremarkable.  WBC 8.1, hemoglobin 14.5, platelets 253,000.  The patient was given ceftriaxone and azithromycin.  He was also given furosemide 80 mg IV x1.  COVID-19 RT-PCR negative   Past Medical History  CKD Proteinuria, Congestive heart failure   Essential hypertension  Gout Hypercholesteremia   Tracheostomy dependence    Significant Hospital Events      Consults:  PCCM pm 12/15  Procedures:  New cuffed trach 12/15   Significant Diagnostic Tests:     Micro Data:  Resp viral panel  12/15 neg MRSA  PCR  12/15 neg  BC x 2 12/15 >>> Urine 12/15 >>> ET 12/16 >>> Urine legionella 12/16 >>> Urine strep 12/16 >>>   Antimicrobials:  zmax  12/15   Rocephin 12/15     Scheduled Meds: . Chlorhexidine Gluconate Cloth  6 each Topical Daily  . furosemide  80 mg Intravenous BID  . sodium chloride flush  3 mL Intravenous Q12H   Continuous Infusions: . sodium chloride    . sodium chloride    . norepinephrine (LEVOPHED) Adult infusion Stopped (10/21/20 0830)  . propofol (DIPRIVAN) infusion 20 mcg/kg/min (10/21/20 1256)   PRN  Meds:.   Interim history/subjective:  Still intermittent air leak breathing over the back up rate with some air stacking/ dysynchrony   Objective   Blood pressure 112/61, pulse (!) 51, temperature 98.1 F (36.7 C), temperature source Oral, resp. rate 20, height 5\' 8"  (1.727 m), weight (!) 147.8 kg, SpO2 93 %.    Vent Mode: PRVC FiO2 (%):  [70 %-100 %] 70 % Set Rate:  [20 bmp] 20 bmp Vt Set:  [400 mL-530 mL] 400 mL PEEP:  [5 cmH20-10 cmH20] 10 cmH20 Plateau Pressure:  [16 cmH20-26 cmH20] 18 cmH20   Intake/Output Summary (Last 24 hours) at 10/21/2020 1403 Last data filed  at 10/21/2020 1256 Gross per 24 hour  Intake 587.67 ml  Output 2600 ml  Net -2012.33 ml   Filed Weights   10/20/20 1124 10/20/20 1847 10/21/20 0500  Weight: (!) 153.3 kg (!) 146.6 kg (!) 147.8 kg    Examination: Tmax 98. 1  (no change ) Pt sedated on vent  Oropharynx clear,  mucosa nl Neck supple Lungs with a few scattered exp > insp rhonchi bilaterally - distant bs  RRR no s3 or or sign murmur Abd obese with very limited excursion  Extr warm with severe chronic appearing venous stasis changes/ pitting edema  Neuro  Sensorium following some commands, says hungry  Resolved Hospital Problem list     Assessment & Plan:  1) acute on chronic hypoxemic/hypercarbic resp falure prob OHS  with vol overload r/o CAP >> despite vent still hypercarbic suggesting marked chronic hypoventilatoin   2)  CRI with vol overload on ACEi PTA Lab Results  Component Value Date   CREATININE 2.76 (H) 10/21/2020   CREATININE 2.43 (H) 10/20/2020    - holding  acei for now and continue  diuresis/ nephrology input prn   3)  Abn cxr ? All fluid   - PCT nl 12/15  - see flowsheet for cultures >>> continue diuresis   4) severe MO the underlying problem here and not easily addressed as inpt with high risk OHS/ chronic vent dep and DVT/ PE    >>> no change vent or sedation for now, recheck cxr am 12/17   Best practice (evaluated daily)   Diet: per triad Pain/Anxiety/Delirium protocol (if indicated): per trei[facility] VAP protocol (if indicated):   DVT prophylaxis: per triad GI prophylaxis: per triad Glucose control: per triad Mobility: bed rest for now  last date of multidisciplinary goals of care:  Per triad/ I also discussed with wife pm 12/16   Code Status: full  Disposition: ICU [facility], no need or transfer at this point   Labs   CBC: Recent Labs  Lab 10/20/20 1217  WBC 8.1  NEUTROABS 6.2  HGB 14.5  HCT 49.8  MCV 101.8*  PLT 423    Basic Metabolic Panel: Recent Labs  Lab  10/20/20 1217 10/21/20 0403  NA 139 139  K 4.5 4.2  CL 100 99  CO2 33* 32  GLUCOSE 97 88  BUN 61* 66*  CREATININE 2.43* 2.76*  CALCIUM 9.2 8.8*   GFR: Estimated Creatinine Clearance: 43.4 mL/min (A) (by C-G formula based on SCr of 2.76 mg/dL (H)). Recent Labs  Lab 10/20/20 1217 10/20/20 1454  PROCALCITON 0.12  --   WBC 8.1  --   LATICACIDVEN 0.7 0.6    Liver Function Tests: Recent Labs  Lab 10/20/20 1217  AST 33  ALT 30  ALKPHOS 114  BILITOT 0.4  PROT 7.9  ALBUMIN 3.2*   No results  for input(s): LIPASE, AMYLASE in the last 168 hours. No results for input(s): AMMONIA in the last 168 hours.  ABG    Component Value Date/Time   PHART 7.242 (L) 10/20/2020 2355   PCO2ART 79.3 (HH) 10/20/2020 2355   PO2ART 80.0 (L) 10/20/2020 2355   HCO3 27.6 10/20/2020 2355   O2SAT 93.6 10/20/2020 2355     Coagulation Profile: Recent Labs  Lab 10/20/20 1259  INR 1.5*    Cardiac Enzymes: No results for input(s): CKTOTAL, CKMB, CKMBINDEX, TROPONINI in the last 168 hours.  HbA1C: No results found for: HGBA1C  CBG: No results for input(s): GLUCAP in the last 168 hours.     Past Medical History  He,  has a past medical history of CKD (chronic kidney disease), Congestive heart failure (CHF) ([place]), Essential hypertension, Fatigue, Gout, Hypercholesteremia, Insomnia, Proteinuria, Renal insufficiency, Shortness of breath, and Tracheostomy dependence ([place]).   Surgical History   History reviewed. No pertinent surgical history.   Social History   reports that he has never smoked. He has never used smokeless tobacco. He reports that he does not drink alcohol and does not use drugs.   Family History   His family history includes Colon cancer in his father; Stomach cancer in his mother.   Allergies No Known Allergies    The patient is critically ill with multiple organ systems failure and requires high complexity decision making for assessment and support, frequent  evaluation and titration of therapies, application of advanced monitoring technologies and extensive interpretation of multiple databases. Critical Care Time devoted to patient care services described in this note is 40 minutes.

## 2020-10-21 NOTE — Progress Notes (Signed)
PROGRESS NOTE  Jack Cox CVE:[id number] DOB: [birth year] DOA: 10/20/2020 PCP: [name], MD  Brief History:  54 y.o. male with medical history of diastolic CHF, atrial flutter, obstructive sleep apnea, hypertension, right bundle branch block presenting with shortness of breath for the last 2 to 3 days.  Unfortunately, the patient is encephalopathic at the time of my evaluation; therefore, history is limited.  Apparently, the patient went to see his nephrologist earlier on 10/20/2020.  He was noted to have oxygen saturation in the 60s.  As result, he was sent to the emergency department for further evaluation.  Initially, the patient was awake and conversant with oxygen saturation in the low 70s.  He was placed on a nonrebreather after which the patient became obtunded.  VBG showed pH 7.1 66/97/106 on 80%.  Subsequently, the patient was placed on the ventilator with PRVC, PEEP of 5, FiO2 70%.  After approximately 10 to 15 minutes on the ventilator, the patient remained somnolent, but awakened and was able to tell me his name.  He did state that he has not been taking his torsemide as directed.  In addition, the patient has been sleeping in a recliner for an unclear duration of time secondary to shortness of breath.  At baseline, the patient has a size 4 noncuffed tracheostomy.  He is on supplemental oxygen 2.5 L during the day and 4 L at nighttime. There is no reports of nausea, vomiting, diarrhea, chest pain, abdominal pain. In the emergency department, the patient was afebrile and hemodynamically stable with oxygen saturation 93-94% on the ventilator with FiO2 70%.  Chest x-ray showed left-sided pleural effusion with increased interstitial markings.  BNP 790.  PCT 0.12.  BMP showed a serum creatinine 2.43.  LFTs were unremarkable.  WBC 8.1, hemoglobin 14.5, platelets 253,000.  The patient was given ceftriaxone and azithromycin.  He was also given furosemide 80 mg IV x1.  COVID-19 RT-PCR  negative  Assessment/Plan: Acute on chronic respiratory failure with hypoxia and hypercarbia -Secondary to CHF in the setting of OSA -remains on a ventilator -Vent settings PRVC, PEEP 10, FiO2 60% -Pulmonary consult appreciated -discuss with pulm--pt will need NG if remains on Diprivan for nutrition  Acute on chronic diastolic CHF -0/25/8527 echo EF 55%, trivial TR -Repeat echo -Continue IV furosemide 80 mg IV twice daily -pt remains clinically fluid overloaded -Daily BMP -Accurate I's and O's -Daily weights  Atrial flutter -Continue apixaban if awake enough to tolerate p.o. -rate controlled -IV metoprolol if has RVR  CKD stage IV -Baseline creatinine 2.4-2.7 -Monitor with diuresis -will need tolerate increased serum creatinine for diuresis  Essential hypertension -Anticipate improvement with diuresis -Continue metoprolol  OSA -The patient has a tracheostomy placed in 2001 -He has a size 4 cuffless trach  Right bundle branch block -This has been chronic wound with review of the medical records  Morbid obesity -BMI 52.94 -lifestyle modification      Status is: Inpatient  Remains inpatient appropriate because:IV treatments appropriate due to intensity of illness or inability to take PO   Dispo: The patient is from: Home              Anticipated d/c is to: Home              Anticipated d/c date is: 3 days              Patient currently is not medically stable to d/c.  Family Communication:   Wife updated  Consultants:  pulm  Code Status:  FULL  DVT Prophylaxis:  [place] Heparin    Procedures: As Listed in Progress Note Above  Antibiotics: None      Subjective: Patient awakens to voice, but pleasantly confused.  Denies cp, n/v abd pain, sob  Objective: Vitals:   10/21/20 1415 10/21/20 1430 10/21/20 1500 10/21/20 1554  BP: 115/63 118/66 (!) 147/118   Pulse: (!) 58 (!) 56 60   Resp: (!) 23 (!) 22 (!) 27   Temp:       TempSrc:      SpO2: 92% 92% 92% 96%  Weight:      Height:        Intake/Output Summary (Last 24 hours) at 10/21/2020 1619 Last data filed at 10/21/2020 1614 Gross per 24 hour  Intake 608.86 ml  Output 3750 ml  Net -3141.14 ml   Weight change:  Exam:   General:  Pt is alert, follows commands appropriately, not in acute distress  HEENT: No icterus, No thrush, No neck mass, [place]/AT  Cardiovascular: RRR, S1/S2, no rubs, no gallops  Respiratory: bibasilar crackles. No wheeze  Abdomen: Soft/+BS, non tender, non distended, no guarding  Extremities: 3+LE edema, No lymphangitis, No petechiae, No rashes, no synovitis   Data Reviewed: I have personally reviewed following labs and imaging studies Basic Metabolic Panel: Recent Labs  Lab 10/20/20 1217 10/21/20 0403  NA 139 139  K 4.5 4.2  CL 100 99  CO2 33* 32  GLUCOSE 97 88  BUN 61* 66*  CREATININE 2.43* 2.76*  CALCIUM 9.2 8.8*   Liver Function Tests: Recent Labs  Lab 10/20/20 1217  AST 33  ALT 30  ALKPHOS 114  BILITOT 0.4  PROT 7.9  ALBUMIN 3.2*   No results for input(s): LIPASE, AMYLASE in the last 168 hours. No results for input(s): AMMONIA in the last 168 hours. Coagulation Profile: Recent Labs  Lab 10/20/20 1259  INR 1.5*   CBC: Recent Labs  Lab 10/20/20 1217  WBC 8.1  NEUTROABS 6.2  HGB 14.5  HCT 49.8  MCV 101.8*  PLT 353   Cardiac Enzymes: No results for input(s): CKTOTAL, CKMB, CKMBINDEX, TROPONINI in the last 168 hours. BNP: Invalid input(s): POCBNP CBG: No results for input(s): GLUCAP in the last 168 hours. HbA1C: No results for input(s): HGBA1C in the last 72 hours. Urine analysis:    Component Value Date/Time   COLORURINE YELLOW 10/20/2020 1542   APPEARANCEUR HAZY (A) 10/20/2020 1542   LABSPEC 1.011 10/20/2020 1542   PHURINE 5.0 10/20/2020 1542   GLUCOSEU 50 (A) 10/20/2020 1542   HGBUR NEGATIVE 10/20/2020 1542   [place] 10/20/2020 1542   KETONESUR NEGATIVE  10/20/2020 1542   PROTEINUR >=300 (A) 10/20/2020 1542   NITRITE NEGATIVE 10/20/2020 1542   LEUKOCYTESUR NEGATIVE 10/20/2020 1542   Sepsis Labs: @LABRCNTIP (procalcitonin:4,lacticidven:4) ) Recent Results (from the past 240 hour(s))  Resp Panel by RT-PCR (Flu A&B, Covid) Nasopharyngeal Swab     Status: None   Collection Time: 10/20/20 11:43 AM   Specimen: Nasopharyngeal Swab; Nasopharyngeal(NP) swabs in vial transport medium  Result Value Ref Range Status   SARS Coronavirus 2 by RT PCR NEGATIVE NEGATIVE Final    Comment: (NOTE) SARS-CoV-2 target nucleic acids are NOT DETECTED.  The SARS-CoV-2 RNA is generally detectable in upper respiratory specimens during the acute phase of infection. The lowest concentration of SARS-CoV-2 viral copies this assay can detect is 138 copies/mL. A negative result does not preclude  SARS-Cov-2 infection and should not be used as the sole basis for treatment or other patient management decisions. A negative result may occur with  improper specimen collection/handling, submission of specimen other than nasopharyngeal swab, presence of viral mutation(s) within the areas targeted by this assay, and inadequate number of viral copies(<138 copies/mL). A negative result must be combined with clinical observations, patient history, and epidemiological information. The expected result is Negative.  Fact Sheet for Patients:  EntrepreneurPulse.com.au  Fact Sheet for Healthcare Providers:  IncredibleEmployment.be  This test is no t yet approved or cleared by the [place] FDA and  has been authorized for detection and/or diagnosis of SARS-CoV-2 by FDA under an Emergency Use Authorization (EUA). This EUA will remain  in effect (meaning this test can be used) for the duration of the COVID-19 declaration under Section 564(b)(1) of the Act, 21 U.S.C.section 360bbb-3(b)(1), unless the authorization is terminated  or revoked  sooner.       Influenza A by PCR NEGATIVE NEGATIVE Final   Influenza B by PCR NEGATIVE NEGATIVE Final    Comment: (NOTE) The Xpert Xpress SARS-CoV-2/FLU/RSV plus assay is intended as an aid in the diagnosis of influenza from Nasopharyngeal swab specimens and should not be used as a sole basis for treatment. Nasal washings and aspirates are unacceptable for Xpert Xpress SARS-CoV-2/FLU/RSV testing.  Fact Sheet for Patients: EntrepreneurPulse.com.au  Fact Sheet for Healthcare Providers: IncredibleEmployment.be  This test is not yet approved or cleared by the [place] FDA and has been authorized for detection and/or diagnosis of SARS-CoV-2 by FDA under an Emergency Use Authorization (EUA). This EUA will remain in effect (meaning this test can be used) for the duration of the COVID-19 declaration under Section 564(b)(1) of the Act, 21 U.S.C. section 360bbb-3(b)(1), unless the authorization is terminated or revoked.  Performed at [facility], [address], [place], [place] [address]   Blood culture (routine x 2)     Status: None (Preliminary result)   Collection Time: 10/20/20 12:18 PM   Specimen: BLOOD  Result Value Ref Range Status   Specimen Description BLOOD  Final   Special Requests NONE  Final   Culture   Final    NO GROWTH < 24 HOURS Performed at [facility], [address] [place], [place] [address]    Report Status PENDING  Incomplete  Blood culture (routine x 2)     Status: None (Preliminary result)   Collection Time: 10/20/20 12:59 PM   Specimen: BLOOD RIGHT ARM  Result Value Ref Range Status   Specimen Description BLOOD RIGHT ARM  Final   Special Requests   Final    BOTTLES DRAWN AEROBIC AND ANAEROBIC Blood Culture adequate volume   Culture   Final    NO GROWTH < 24 HOURS Performed at [facility], [address] [place], [place] [address]    Report Status PENDING  Incomplete  MRSA PCR Screening     Status: None    Collection Time: 10/20/20  6:42 PM   Specimen: Nasal Mucosa; Nasopharyngeal  Result Value Ref Range Status   MRSA by PCR NEGATIVE NEGATIVE Final    Comment:        The GeneXpert MRSA Assay (FDA approved for NASAL specimens only), is one component of a comprehensive MRSA colonization surveillance program. It is not intended to diagnose MRSA infection nor to guide or monitor treatment for MRSA infections. Performed at [facility], [address] [place], [place] [address]      Scheduled Meds:  Chlorhexidine Gluconate Cloth  6 each Topical Daily   furosemide  80 mg Intravenous BID   sodium chloride flush  3 mL Intravenous Q12H   Continuous Infusions:  sodium chloride     sodium chloride     norepinephrine (LEVOPHED) Adult infusion Stopped (10/21/20 0830)   propofol (DIPRIVAN) infusion 20 mcg/kg/min (10/21/20 1514)    Procedures/Studies: DG Chest Port 1 View  Result Date: 10/20/2020 CLINICAL DATA:  Questionable sepsis EXAM: PORTABLE CHEST 1 VIEW COMPARISON:  Chest x-ray 06/12/2018 report without images. FINDINGS: Tracheostomy tube terminates approximately 7.5 cm above the carina. The heart size and mediastinal contours are not well visualized. Bilateral hilar vasculature prominence. Aortic arch calcifications. Silhouetting off of the left hemidiaphragm and left cardiac border. Partial silhouetting off the right cardiac border and right hemidiaphragm. Increased interstitial markings. Likely at least trace to small volume left pleural effusion. No pneumothorax. No acute osseous abnormality. IMPRESSION: 1. Left lung opacity and likely at least trace to small volume left pleural effusion with silhouetting off of the left diaphragm and heart border. Concern for infection/inflammation. 2. Right lower lobe and middle lobe airspace opacity could represent a combination of atelectasis, infection, inflammation. 3. At least mild pulmonary edema. 4. Consider chest x-ray PA and lateral view  for further evaluation. Electronically Signed   By: Iven Finn M.D.   On: 10/20/2020 12:26    [name], DO  [facility]  If 7PM-7AM, please contact night-coverage [email or web address] Password [email or web address] 10/21/2020, 4:19 PM   LOS: 1 day

## 2020-10-21 NOTE — Progress Notes (Signed)
**Note De-identified Krizia Flight Obfuscation** Sputum collected and sent to lab 

## 2020-10-21 NOTE — Consult Note (Signed)
Consultation Note Date: 10/21/2020   Patient Name: Jack Cox  DOB: July 17, 1966  MRN: [id number]  Age / Sex: 54 y.o., male  PCP: [name], MD Referring Physician: [name], MD  Reason for Consultation: Establishing goals of care  HPI/Patient Profile: 54 y.o. male  with past medical history of chronic kidney disease, diastolic congestive heart failure, atrial flutter, obstructive sleep apnea status post trach placement, chronic respiratory failure on 2 L of oxygen at home during the day and 4 L at night hypertension, right bundle branch block, admitted on 10/20/2020 with increasing shortness of breath after presenting his nephrologist in findings of oxygen saturation at 70%.  He reported ongoing worsening shortness of breath over the past several days.  In the emergency room he continued to decline and eventually required placement on the ventilator.  He is blood gases showed that he was hypercarbic and hypoxic.  Chest x-ray showed pleural effusion and infiltrates concerning for pneumonia.  He is being treated for congestive heart failure exacerbation superimposed on pneumonia.  Palliative medicine consulted for goals of care.  Clinical Assessment and Goals of Care: Chart reviewed, but report received from Dr. Carles Collet, evaluated patient at bedside.  He is sedated on propofol and on ventilator support through his trach. His spouse Philemon Riedesel is at the bedside.  She shares with me that they are legally divorced, however they have remained close and she has been involved in his care.  They had recently been talking about getting remarried.  He has several brothers 1 brother who he lives with.  He discussed surrogate decision making in [place] indicates that without a designated HCPOA, that patient's brothers would be primary Garment/textile technologist.  [name] agrees with this and says that she communicates  regularly with the patient's brothers and involves them in decision making.  We discussed that it would be necessary when and if patient is able to wake to complete HCPOA paperwork designating who patient would wish to make decisions for him. [name] enjoys sports and watching football, prior to admission go to his friends houses to gather and enjoy sports.  He is a English as a second language teacher.  He lived with his brother however he was independent with his ADLs.  [name] does share that over the last several months his function has decreased noticing that he would get very short of breath very easily with ambulation and personal care.  We discussed his current illness state and his multiple comorbidities.   [name] was surprised when we discussed the fact that he is on a ventilator and this is considered life support.  Emotional support was provided.  A great amount of time was spent in educating and on congestive heart failure, pneumonia, chronic kidney disease and how they all relate to each other and the difficult balance of improving one comorbidity while avoiding making another worse.  We discussed the importance of considering what [name] considers to be quality of life.  CODE STATUS and advance care planning including rehospitalization, long-term ventilator support, SNF, and feeding tube was  discussed.  I encouraged [name] to begin considering what [name] would say about these things and also discussing this with his brothers in the context of hoping for the best, however being prepared for the worst. At this point [place] had not had any of these discussions and [name] would of course not make any decisions without consulting patient's brothers.   Primary Decision Maker NEXT OF KIN- legally is patient's brothers- their information is not on patient's chart- but patient's ex-spouse would be able to reach them    SUMMARY OF RECOMMENDATIONS -Continue full scope, full code -PMT will continue to follow and  provide support and education to patient's family- of note- [name] is a CNA- today's visit was primarily devoted to education, relationship building, and counseling on future decisions that may need to be made- official discussions with patient's legal surrogate decision maker will need to occur prior to any changes in care plan unless they defer to [place] provider will return on Monday      Code Status/Advance Care Planning:  Full code  Prognosis:    Unable to determine  Discharge Planning: To Be Determined  Primary Diagnoses: Present on Admission: . Acute on chronic respiratory failure ([place]) . OSA (obstructive sleep apnea)   I have reviewed the medical record, interviewed the patient and family, and examined the patient. The following aspects are pertinent.  Past Medical History:  Diagnosis Date  . CKD (chronic kidney disease)   . Congestive heart failure (CHF) ([place])   . Essential hypertension   . Fatigue   . Gout   . Hypercholesteremia   . Insomnia   . Proteinuria   . Renal insufficiency   . Shortness of breath   . Tracheostomy dependence ([place])    Social History   Socioeconomic History  . Marital status: Married    Spouse name: Not on file  . Number of children: Not on file  . Years of education: Not on file  . Highest education level: Not on file  Occupational History  . Not on file  Tobacco Use  . Smoking status: Never Smoker  . Smokeless tobacco: Never Used  Vaping Use  . Vaping Use: Never used  Substance and Sexual Activity  . Alcohol use: No  . Drug use: No  . Sexual activity: Not on file  Other Topics Concern  . Not on file  Social History Narrative  . Not on file   Social Determinants of Health   Financial Resource Strain: Not on file  Food Insecurity: Not on file  Transportation Needs: Not on file  Physical Activity: Not on file  Stress: Not on file  Social Connections: Not on file   Scheduled Meds: . Chlorhexidine Gluconate Cloth   6 each Topical Daily  . furosemide  80 mg Intravenous BID  . sodium chloride flush  3 mL Intravenous Q12H   Continuous Infusions: . sodium chloride    . sodium chloride    . norepinephrine (LEVOPHED) Adult infusion Stopped (10/21/20 0830)  . propofol (DIPRIVAN) infusion 20 mcg/kg/min (10/21/20 1256)   PRN Meds:.sodium chloride, acetaminophen, fentaNYL (SUBLIMAZE) injection, LORazepam, ondansetron (ZOFRAN) IV, sodium chloride flush Medications Prior to Admission:  Prior to Admission medications   Medication Sig Start Date End Date Taking? Authorizing Provider  amLODipine (NORVASC) 10 MG tablet Take 10 mg by mouth daily. 03/13/19  Yes [provider]  albuterol (VENTOLIN HFA) 108 (90 Base) MCG/ACT inhaler Inhale 1 puff into the lungs See admin instructions. Inhale 1  puff into the lungs every 4 to 6 hours as needed for shortness of breath 07/27/20   [provider]  allopurinol (ZYLOPRIM) 300 MG tablet Take 300 mg by mouth daily.    [provider]  apixaban (ELIQUIS) 2.5 MG TABS tablet Take by mouth.    [provider]  aspirin EC 81 MG tablet Take 81 mg by mouth daily.    [provider]  buPROPion [facility] SR) 100 MG 12 hr tablet Take by mouth. 07/03/18   [provider]  Cholecalciferol 25 MCG (1000 UT) tablet Take by mouth.    [provider]  clonazePAM (KLONOPIN) 0.5 MG tablet Take by mouth. 11/13/17   [provider]  colchicine-probenecid 0.5-500 MG tablet Take by mouth. 10/07/19   [provider]  ferrous sulfate 325 (65 FE) MG tablet Take by mouth.    [provider]  furosemide (LASIX) 40 MG tablet Take 40 mg by mouth.    [provider]  hydrALAZINE (APRESOLINE) 25 MG tablet Take 25 mg by mouth 3 (three) times daily. 07/30/20   [provider]  levofloxacin (LEVAQUIN) 500 MG tablet Take 500 mg by mouth daily. 07/27/20   [provider]  lisinopril (PRINIVIL,ZESTRIL) 40  MG tablet Take 40 mg by mouth daily.    [provider]  meloxicam (MOBIC) 15 MG tablet Take 15 mg by mouth daily. 06/22/20   [provider]  metoprolol succinate (TOPROL-XL) 50 MG 24 hr tablet Take 50 mg by mouth daily. Take with or immediately following a meal.    [provider]  mirtazapine (REMERON) 30 MG tablet Take 30 mg by mouth at bedtime.    [provider]  Multiple Vitamins-Minerals (MULTIVITAMIN WITH MINERALS) tablet Take by mouth.    [provider]  torsemide (DEMADEX) 20 MG tablet Take by mouth. 06/19/19   [provider]  traMADol (ULTRAM) 50 MG tablet Take 50 mg by mouth 3 (three) times daily as needed. 06/22/20   [provider]   No Known Allergies Review of Systems  Unable to perform ROS   Physical Exam Vitals and nursing note reviewed.  Cardiovascular:     Rate and Rhythm: Normal rate.  Pulmonary:     Comments: On vent support via trach Neurological:     Comments: sedated     Vital Signs: BP 112/61   Pulse (!) 51   Temp 98.1 F (36.7 C) (Oral)   Resp 20   Ht 5\' 8"  (1.727 m)   Wt (!) 147.8 kg   SpO2 93%   BMI 49.54 kg/m  Pain Scale: CPOT   Pain Score: 0-No pain   SpO2: SpO2: 93 % O2 Device:SpO2: 93 % O2 Flow Rate: .O2 Flow Rate (L/min): 2 L/min  IO: Intake/output summary:   Intake/Output Summary (Last 24 hours) at 10/21/2020 1420 Last data filed at 10/21/2020 1256 Gross per 24 hour  Intake 587.67 ml  Output 2600 ml  Net -2012.33 ml    LBM:   Baseline Weight: Weight: (!) 153.3 kg Most recent weight: Weight: (!) 147.8 kg     Palliative Assessment/Data: PPS: 10%     Thank you for this consult. Palliative medicine will continue to follow and assist as needed.   Time In: 1313 Time Out: 1436 Time Total: 86 mins Greater than 50%  of this time was spent counseling and coordinating care related to the above assessment and plan.  Signed by: Mariana Kaufman, AGNP-C Palliative  Medicine  Please contact Palliative Medicine Team phone at [phone] for questions and concerns.  For individual provider: See Shea Evans

## 2020-10-21 NOTE — Progress Notes (Signed)
*  PRELIMINARY RESULTS* Echocardiogram 2D Echocardiogram has been performed.  Jack Cox 10/21/2020, 2:26 PM

## 2020-10-21 NOTE — TOC Initial Note (Signed)
Transition of Care [facility]) - Initial/Assessment Note   Patient Details  Name: Jack Cox MRN: [id number] Date of Birth: 12-21-1965  [facility] CM/SW Contact:    Sherie Don, LCSW Phone Number: 10/21/2020, 11:36 AM  Clinical Narrative: Patient is a 54 year old male who was admitted for acute on chronic respiratory failure with hypoxia and hypercapnia. Patient has a history of CHF, OSA, CKD, and is trach-dependent. TOC received consult for HH/DME needs. CSW completed assessment with patient's wife, Jestin Burbach, as patient is currently on a vent.  Per wife, patient resides at home with her. Patient requires extensive assistance with ADLs, which the wife provides. Patient does not have a history of [place] services. Wife reported the patient's only DME is O2 that he gets through his [employer] benefits. Wife unsure of the company that delivers it. Wife provides patient's transportation to most appointments, but he also gets transportation occasionally through the [place]. Patient is able to afford his monthly medications, which wife stated he takes as prescribed. TOC to follow.  Expected Discharge Plan: Home/Self Care Barriers to Discharge: Continued Medical Work up  Patient Goals and CMS Choice Patient states their goals for this hospitalization and ongoing recovery are:: Get stronger CMS Medicare.gov Compare Post Acute Care list provided to:: Patient Represenative (must comment) Derrill Kay (wife)) Choice offered to / list presented to : Spouse  Expected Discharge Plan and Services Expected Discharge Plan: Home/Self Care In-house Referral: Clinical Social Work Discharge Planning Services: NA Living arrangements for the past 2 months: Single Family Home            DME Arranged: N/A DME Agency: NA HH Arranged: NA [place] Agency: NA  Prior Living Arrangements/Services Living arrangements for the past 2 months: [place] Lives with:: Spouse Patient language and need for  interpreter reviewed:: Yes Do you feel safe going back to the place where you live?: Yes      Need for Family Participation in Patient Care: Yes (Comment) Care giver support system in place?: Yes (comment) Current home services: DME (O2) Criminal Activity/Legal Involvement Pertinent to Current Situation/Hospitalization: No - Comment as needed  Permission Sought/Granted Permission sought to share information with : Facility Art therapist granted to share information with : Yes, Verbal Permission Granted Permission granted to share info w AGENCY: SNFs and [facility] agencies (depending on patient's needs)  Emotional Assessment Attitude/Demeanor/Rapport: Unable to Assess (Patient is on vent) Affect (typically observed): Unable to Assess Orientation: : Oriented to Self Alcohol / Substance Use: Not Applicable Psych Involvement: No (comment)  Admission diagnosis:  Acute on chronic respiratory failure (HCC) [J96.20] Acute respiratory failure with hypoxia and hypercapnia (HCC) [J96.01, J96.02] Community acquired pneumonia, unspecified laterality [J18.9] Acute on chronic congestive heart failure, unspecified heart failure type ([place]) [I50.9] Patient Active Problem List   Diagnosis Date Noted  . Acute on chronic respiratory failure ([place]) 10/20/2020  . Acute on chronic respiratory failure with hypoxia and hypercapnia ([place]) 10/20/2020  . Acute on chronic diastolic CHF (congestive heart failure) ([place]) 10/20/2020  . Morbid obesity with BMI of 50.0-59.9, adult ([place]) 10/20/2020  . CKD (chronic kidney disease) stage 4, GFR 15-29 ml/min (HCC) 10/20/2020  . Chronic respiratory failure with hypoxia ([place]) 02/05/2018  . OSA (obstructive sleep apnea) 10/23/2017  . 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

## 2020-10-22 ENCOUNTER — Inpatient Hospital Stay (HOSPITAL_COMMUNITY): Payer: Medicare Other

## 2020-10-22 LAB — COMPREHENSIVE METABOLIC PANEL
ALT: 20 U/L (ref 0–44)
AST: 22 U/L (ref 15–41)
Albumin: 2.3 g/dL — ABNORMAL LOW (ref 3.5–5.0)
Alkaline Phosphatase: 84 U/L (ref 38–126)
Anion gap: 10 (ref 5–15)
BUN: 74 mg/dL — ABNORMAL HIGH (ref 6–20)
CO2: 32 mmol/L (ref 22–32)
Calcium: 8.5 mg/dL — ABNORMAL LOW (ref 8.9–10.3)
Chloride: 100 mmol/L (ref 98–111)
Creatinine, Ser: 3.39 mg/dL — ABNORMAL HIGH (ref 0.61–1.24)
GFR, Estimated: 21 mL/min — ABNORMAL LOW (ref >60)
Glucose, Bld: 62 mg/dL — ABNORMAL LOW (ref 70–99)
Potassium: 4.1 mmol/L (ref 3.5–5.1)
Sodium: 142 mmol/L (ref 135–145)
Total Bilirubin: 1 mg/dL (ref 0.3–1.2)
Total Protein: 6.2 g/dL — ABNORMAL LOW (ref 6.5–8.1)

## 2020-10-22 LAB — GLUCOSE, CAPILLARY
Glucose-Capillary: 69 mg/dL — ABNORMAL LOW (ref 70–99)
Glucose-Capillary: 94 mg/dL (ref 70–99)

## 2020-10-22 LAB — CBC
HCT: 41.7 % (ref 39.0–52.0)
Hemoglobin: 12.5 g/dL — ABNORMAL LOW (ref 13.0–17.0)
MCH: 29.9 pg (ref 26.0–34.0)
MCHC: 30 g/dL (ref 30.0–36.0)
MCV: 99.8 fL (ref 80.0–100.0)
Platelets: 269 10*3/uL (ref 150–400)
RBC: 4.18 MIL/uL — ABNORMAL LOW (ref 4.22–5.81)
RDW: 13.8 % (ref 11.5–15.5)
WBC: 5.6 10*3/uL (ref 4.0–10.5)
nRBC: 0 % (ref 0.0–0.2)

## 2020-10-22 LAB — POCT I-STAT CREATININE: Creatinine, Ser: 2.4 mg/dL — ABNORMAL HIGH (ref 0.61–1.24)

## 2020-10-22 LAB — LEGIONELLA PNEUMOPHILA SEROGP 1 UR AG: L. pneumophila Serogp 1 Ur Ag: NEGATIVE

## 2020-10-22 LAB — BRAIN NATRIURETIC PEPTIDE: B Natriuretic Peptide: 164 pg/mL — ABNORMAL HIGH (ref 0.0–100.0)

## 2020-10-22 LAB — PROCALCITONIN: Procalcitonin: 0.61 ng/mL

## 2020-10-22 MED ORDER — DEXTROSE 50 % IV SOLN
12.5000 g | INTRAVENOUS | Status: AC
  Administered 2020-10-22: 12.5 g via INTRAVENOUS
  Filled 2020-10-22: qty 50

## 2020-10-22 MED ORDER — ALBUTEROL SULFATE (2.5 MG/3ML) 0.083% IN NEBU
2.5000 mg | INHALATION_SOLUTION | Freq: Four times a day (QID) | RESPIRATORY_TRACT | Status: DC
  Administered 2020-10-22 – 2020-10-29 (×28): 2.5 mg via RESPIRATORY_TRACT
  Filled 2020-10-22 (×28): qty 3

## 2020-10-22 MED ORDER — ORAL CARE MOUTH RINSE
15.0000 mL | OROMUCOSAL | Status: DC
  Administered 2020-10-23 – 2020-10-29 (×61): 15 mL via OROMUCOSAL

## 2020-10-22 MED ORDER — CHLORHEXIDINE GLUCONATE 0.12% ORAL RINSE (MEDLINE KIT)
15.0000 mL | Freq: Two times a day (BID) | OROMUCOSAL | Status: DC
  Administered 2020-10-22 – 2020-11-12 (×42): 15 mL via OROMUCOSAL

## 2020-10-22 MED ORDER — VITAL AF 1.2 CAL PO LIQD
1000.0000 mL | ORAL | Status: DC
  Administered 2020-10-22: 1000 mL

## 2020-10-22 MED ORDER — PANTOPRAZOLE SODIUM 40 MG IV SOLR
40.0000 mg | Freq: Two times a day (BID) | INTRAVENOUS | Status: DC
  Administered 2020-10-22 – 2020-10-25 (×6): 40 mg via INTRAVENOUS
  Filled 2020-10-22 (×7): qty 40

## 2020-10-22 MED ORDER — ACETYLCYSTEINE 20 % IN SOLN
1.0000 mL | Freq: Four times a day (QID) | RESPIRATORY_TRACT | Status: DC
  Administered 2020-10-22 – 2020-10-29 (×28): 1 mL via RESPIRATORY_TRACT
  Filled 2020-10-22 (×23): qty 4

## 2020-10-22 MED ORDER — ENOXAPARIN SODIUM 150 MG/ML ~~LOC~~ SOLN
1.0000 mg/kg | SUBCUTANEOUS | Status: DC
  Administered 2020-10-22 – 2020-10-23 (×2): 150 mg via SUBCUTANEOUS
  Filled 2020-10-22 (×5): qty 1

## 2020-10-22 NOTE — Progress Notes (Addendum)
NAME:  Jack Cox, MRN:  [id number], DOB:  [birth year], LOS: 2 ADMISSION DATE:  10/20/2020, CONSULTATION DATE:  10/20/20  REFERRING MD:  Jack Cox, Jack Cox  CHIEF COMPLAINT:  Resp distress   Brief History   54 yo never smoker with MO complicated by severe OSA req trach 2001 last eval by Jack Cox 2018 also followed by ENT admit with increasing sob x 48 h pta s fever /purulent sputum and found to have severe hypercabic hypoxemic resp failure on arrival to ER am 12/15 so placed on vent and PCCM service requested to eval.   Wife reported pt not able to lie back on regular settings for 02/cpap x > 1 one week PTA assoc with acute on chronic leg swelling  History of present illness   54 y.o. male with medical history of diastolic CHF, atrial flutter, obstructive sleep apnea, hypertension, right bundle branch block presenting with shortness of breath for the last 2 to 3 days.    Apparently, the patient went to see his nephrologist earlier on 10/20/2020.  He was noted to have oxygen saturation in the 60s.  As result, he was sent to the emergency department for further evaluation.  Initially, the patient was awake and conversant with oxygen saturation in the low 70s.  He was placed on a nonrebreather after which the patient became obtunded.  VBG showed pH 7.1 66/97/106 on 80%.  Subsequently, the patient was placed on the ventilator with PRVC, PEEP of 5, FiO2 70%.  After approximately 10 to 15 minutes on the ventilator, the patient remained somnolent, but awakened and was able to say his name.  He did state that he has not been taking his torsemide as directed.  In addition, the patient has been sleeping in a recliner for an unclear duration of time secondary to shortness of breath.  At baseline, the patient has a size 4 noncuffed tracheostomy.  He is on supplemental oxygen 2.5 L during the day and 4 L at nighttime. There is no reports of nausea, vomiting, diarrhea, chest pain, abdominal pain. In the emergency  department, the patient was afebrile and hemodynamically stable with oxygen saturation 93-94% on the ventilator with FiO2 70%.  Chest x-ray showed left-sided pleural effusion with increased interstitial markings.  BNP 790.  PCT 0.12.  BMP showed a serum creatinine 2.43.  LFTs were unremarkable.  WBC 8.1, hemoglobin 14.5, platelets 253,000.  The patient was given ceftriaxone and azithromycin.  He was also given furosemide 80 mg IV x1.  COVID-19 RT-PCR negative   Past Medical History  CKD Proteinuria, Congestive heart failure   Essential hypertension  Gout Hypercholesteremia   Tracheostomy dependence    Significant Hospital Events    changed to PCV pm 12/17   Consults:  PCCM pm 12/15  Procedures:  New cuffed trach 12/15   Significant Diagnostic Tests:   echo 12/16 ok LV There is the interventricular septum is flattened in  systole and diastole, consistent with right ventricular pressure and  volume overload.  2. Right ventricular systolic function is mildly reduced. The right  ventricular size is mildly enlarged. Tricuspid regurgitation signal is  inadequate for assessing PA pressure.  3. Right atrial size was upper normal/ RAP 40mm   Micro Data:  Resp viral panel  12/15 neg MRSA  PCR  12/15 neg  BC x 2 12/15 >>> Urine 12/15 >>>  Neg  ET 12/16 > few wbcs mostly mononuclear / no org seen >>> Urine legionella 12/16 >>> Urine strep 12/16 >  Neg    Antimicrobials:  zmax  12/15   Rocephin 12/15     Scheduled Meds: . Chlorhexidine Gluconate Cloth  6 each Topical Daily  . furosemide  80 mg Intravenous BID  . sodium chloride flush  3 mL Intravenous Q12H   Continuous Infusions: . sodium chloride    . sodium chloride    . norepinephrine (LEVOPHED) Adult infusion Stopped (10/21/20 0830)  . propofol (DIPRIVAN) infusion 30 mcg/kg/min (10/22/20 1133)   PRN Meds:.   Interim history/subjective:  Still some air leak/ when not sedated this is worse with air leak / transiently  required pressors  But off now   Objective   Blood pressure (!) 168/86, pulse 76, temperature 99.2 F (37.3 C), temperature source Oral, resp. rate (!) 24, height 5\' 8"  (1.727 m), weight (!) 149 kg, SpO2 90 %.    Vent Mode: PRVC FiO2 (%):  [60 %-100 %] 75 % Set Rate:  [20 bmp] 20 bmp Vt Set:  [400 mL] 400 mL PEEP:  [10 cmH20] 10 cmH20 Plateau Pressure:  [15 cmH20-18 cmH20] 17 cmH20   Intake/Output Summary (Last 24 hours) at 10/22/2020 1315 Last data filed at 10/22/2020 2353 Gross per 24 hour  Intake 760.7 ml  Output 2200 ml  Net -1439.3 ml   Filed Weights   10/20/20 1847 10/21/20 0500 10/22/20 0436  Weight: (!) 146.6 kg (!) 147.8 kg (!) 149 kg    Examination: Tmax  99.7 Pt sedated on vent No jvd Oropharynx big tongue Neck supple/ #4 cuffed trach in position with positional air leak  Lungs with a few scattered exp > insp rhonchi bilaterally RRR no s3 or or sign murmur Abd obese with  limitedexcursion  Extr warm with 2+ ptting  Edema     I personally reviewed images and agree with radiology impression as follows:  CXR:  Portable 12/17 Slight improvement of bilateral airspace opacities which could reflect pneumonia or atelectasis.   Resolved Hospital Problem list     Assessment & Plan:  1) acute on chronic hypoxemic/hypercarbic resp falure prob OHS  with vol overload r/o CAP >> very dyssynchronous on prvc when not sedated so pm 12/17 > changed to PCV  2)  CRI with vol overload on ACEi PTA Lab Results  Component Value Date   CREATININE 3.39 (H) 10/22/2020   CREATININE 2.76 (H) 10/21/2020   CREATININE 2.40 (H) 10/20/2020   - holding  acei for now and continue  diuresis/ nephrology input prn   3)  Abn cxr ? All fluid   - PCT nl 12/15 and again 12/17 with echo c/w more R than L heart failure  >>> full anticoagulation / hold lasox with creat rising     4) severe MO the underlying problem here and not easily addressed as inpt with high risk OHS/ chronic vent dep  and DVT/ PE       Best practice (evaluated daily)   Diet: per Jack Cox Pain/Anxiety/Delirium protocol (if indicated): per [place] VAP protocol (if indicated):   DVT prophylaxis: should either be on apixaban or full dose lovenox > since now sure he can swallow will chose latter  GI prophylaxis: per Jack Cox Glucose control: per Jack Cox Mobility: bed rest for now  last date of multidisciplinary goals of care:  Per Jack Cox/ I also discussed with wife pm 12/16   Code Status: full  Disposition: ICU [facility], no need or transfer at this point   Labs   CBC: Recent Labs  Lab 10/20/20 1217 10/22/20 6144  WBC 8.1 5.6  NEUTROABS 6.2  --   HGB 14.5 12.5*  HCT 49.8 41.7  MCV 101.8* 99.8  PLT 353 329    Basic Metabolic Panel: Recent Labs  Lab 10/20/20 1217 10/20/20 1224 10/21/20 0403 10/22/20 0512  NA 139  --  139 142  K 4.5  --  4.2 4.1  CL 100  --  99 100  CO2 33*  --  32 32  GLUCOSE 97  --  88 62*  BUN 61*  --  66* 74*  CREATININE 2.43* 2.40* 2.76* 3.39*  CALCIUM 9.2  --  8.8* 8.5*   GFR: Estimated Creatinine Clearance: 35.4 mL/min (A) (by C-G formula based on SCr of 3.39 mg/dL (H)). Recent Labs  Lab 10/20/20 1217 10/20/20 1454 10/22/20 0512  PROCALCITON 0.12  --  0.61  WBC 8.1  --  5.6  LATICACIDVEN 0.7 0.6  --     Liver Function Tests: Recent Labs  Lab 10/20/20 1217 10/22/20 0512  AST 33 22  ALT 30 20  ALKPHOS 114 84  BILITOT 0.4 1.0  PROT 7.9 6.2*  ALBUMIN 3.2* 2.3*   No results for input(s): LIPASE, AMYLASE in the last 168 hours. No results for input(s): AMMONIA in the last 168 hours.  ABG    Component Value Date/Time   PHART 7.242 (L) 10/20/2020 2355   PCO2ART 79.3 (HH) 10/20/2020 2355   PO2ART 80.0 (L) 10/20/2020 2355   HCO3 27.6 10/20/2020 2355   O2SAT 93.6 10/20/2020 2355     Coagulation Profile: Recent Labs  Lab 10/20/20 1259  INR 1.5*    Cardiac Enzymes: No results for input(s): CKTOTAL, CKMB, CKMBINDEX, TROPONINI in the last 168  hours.  HbA1C: No results found for: HGBA1C  CBG: No results for input(s): GLUCAP in the last 168 hours.     The patient is critically ill with multiple organ systems failure and requires high complexity decision making for assessment and support, frequent evaluation and titration of therapies, application of advanced monitoring technologies and extensive interpretation of multiple databases. Critical Care Time devoted to patient care services described in this note is 35 minutes.   Christinia Gully, MD Pulmonary and [place] [phone]   After 7:00 pm call Elink  [phone]

## 2020-10-22 NOTE — Progress Notes (Signed)
Hypoglycemic Event  CBG: 69  Treatment: 12.5 G D50  Symptoms: None  Follow-up CBG: Time:2205 CBG Result:  Possible Reasons for Event:   Comments/MD notified:    Clancy Gourd

## 2020-10-22 NOTE — Progress Notes (Addendum)
PROGRESS NOTE  Jack Cox ZOX:[id number] DOB: 08/06/1966 DOA: 10/20/2020 PCP: [name], MD  Brief History:  54 y.o.malewith medical history ofdiastolic CHF, atrial flutter, obstructive sleep apnea, hypertension, right bundle branch block presenting with shortness of breath for the last 2 to 3 days. Unfortunately, the patient is encephalopathic at the time of my evaluation; therefore, history is limited. Apparently, the patient went to see his nephrologist earlier on 10/20/2020. He was noted to have oxygen saturation in the 60s. As result, he was sent to the emergency department for further evaluation. Initially, the patient was awake and conversant with oxygen saturation in the low 70s. He was placed on a nonrebreather after which the patient became obtunded. VBG showed pH 7.1 66/97/106 on 80%. Subsequently, the patient was placed on the ventilator with PRVC,PEEP of 5, FiO2 70%. After approximately 10 to 15 minutes on the ventilator, the patient remained somnolent, but awakened and was able to tell me his name. He did state that he has not been taking his torsemide as directed. In addition, the patient has been sleeping in a recliner for an unclear duration of time secondary to shortness of breath. At baseline, the patient has a size 4 noncuffed tracheostomy. He is on supplemental oxygen 2.5 L during the day and 4 L at nighttime. There is no reports of nausea, vomiting, diarrhea, chest pain, abdominal pain. In the emergency department, the patient was afebrile and hemodynamically stable with oxygen saturation 93-94% on the ventilator with FiO2 70%. Chest x-ray showed left-sided pleural effusion with increased interstitial markings. BNP 790. PCT 0.12. BMP showed a serum creatinine 2.43. LFTs were unremarkable. WBC 8.1, hemoglobin 14.5, platelets 253,000. The patient was given ceftriaxone and azithromycin. He was also given furosemide 80 mg IV x1. COVID-19 RT-PCR  negative  Assessment/Plan:  Acute on chronic respiratory failure with hypoxia and hypercarbia -Secondary to CHF in the setting of OSA -remains on a ventilator -personally reviewed CXR--increased interstitial markings -Vent settings PC 16, PEEP 10 FiO2 75% -Pulmonary consult appreciated -discuss with pulm--pt will need NG if remains on Diprivan for nutrition -PCT 0.12>>0.61  Acute on chronic diastolic CHF/Cor Pulmonale -06/25/2019 echo EF 55%, trivial TR -10/21/20 echo--EF 60-65%, no WMA, RV pressure overload, mild TR -Continue IV furosemide 80 mg IV twice daily-->held by PCCM after am 12/17 dose due to increasing serum cr -pt remains clinically fluid overloaded -Daily BMP -Accurate I's and O's -Daily weights  Atrial flutter -Continue apixaban if awake enough to tolerate p.o. -rate controlled -IV metoprolol if has RVR  Acute on chronic CKD stage IV -Baseline creatinine 2.4-2.7 -Monitor with diuresis -will need tolerate increased serum creatinine for diuresis -consult nephrology  Acute metabolic Encephalopathy -due to hypoxia and hypercarbia at the time of admission -patient now on sedation with propofol--cannot assess appropriately at present  Essential hypertension -Anticipate improvement with diuresis -Continue metoprolol  OSA -The patient has a tracheostomy placed in 2001 -He has a size 4 cuffless trach  Right bundle branch block -This has been chronic wound with review of the medical records  Morbid obesity -BMI 52.94 -lifestyle modification      Status is: Inpatient  Remains inpatient appropriate because:IV treatments appropriate due to intensity of illness or inability to take PO   Dispo: The patient is from: Home  Anticipated d/c is to: Home  Anticipated d/c date is: 3 days  Patient currently is not medically stable to d/c.        Family  Communication:   brother updated bedside  12/17  Consultants:  pulm  Code Status:  FULL  DVT Prophylaxis:  [place] Lovenox--treatment dose   Procedures: As Listed in Progress Note Above  Antibiotics: None      Subjective: Patient sedated on vent.  No respiratory distress, but dyssynchronous from vent off sedation.  No vomiting or diarrhea  Objective: Vitals:   10/22/20 1200 10/22/20 1300 10/22/20 1319 10/22/20 1400  BP: (!) 152/88 127/69  125/65  Pulse: 75 69  70  Resp: (!) 26 15  (!) 21  Temp: 99.2 F (37.3 C)     TempSrc: Oral     SpO2: 92% 96% 95% 93%  Weight:      Height:        Intake/Output Summary (Last 24 hours) at 10/22/2020 1422 Last data filed at 10/22/2020 2229 Gross per 24 hour  Intake 760.7 ml  Output 2200 ml  Net -1439.3 ml   Weight change: -4.316 kg Exam:   General:  Pt is sedated on vent  HEENT: No icterus, No thrush, No neck mass, [place]/AT  Cardiovascular: RRR, S1/S2, no rubs, no gallops  Respiratory: bilateral rales.  No wheeze  Abdomen: Soft/+BS, non tender, non distended, no guarding  Extremities: 2+LE edema, No lymphangitis, No petechiae, No rashes, no synovitis   Data Reviewed: I have personally reviewed following labs and imaging studies Basic Metabolic Panel: Recent Labs  Lab 10/20/20 1217 10/20/20 1224 10/21/20 0403 10/22/20 0512  NA 139  --  139 142  K 4.5  --  4.2 4.1  CL 100  --  99 100  CO2 33*  --  32 32  GLUCOSE 97  --  88 62*  BUN 61*  --  66* 74*  CREATININE 2.43* 2.40* 2.76* 3.39*  CALCIUM 9.2  --  8.8* 8.5*   Liver Function Tests: Recent Labs  Lab 10/20/20 1217 10/22/20 0512  AST 33 22  ALT 30 20  ALKPHOS 114 84  BILITOT 0.4 1.0  PROT 7.9 6.2*  ALBUMIN 3.2* 2.3*   No results for input(s): LIPASE, AMYLASE in the last 168 hours. No results for input(s): AMMONIA in the last 168 hours. Coagulation Profile: Recent Labs  Lab 10/20/20 1259  INR 1.5*   CBC: Recent Labs  Lab 10/20/20 1217 10/22/20 0512  WBC 8.1 5.6  NEUTROABS 6.2   --   HGB 14.5 12.5*  HCT 49.8 41.7  MCV 101.8* 99.8  PLT 353 269   Cardiac Enzymes: No results for input(s): CKTOTAL, CKMB, CKMBINDEX, TROPONINI in the last 168 hours. BNP: Invalid input(s): POCBNP CBG: No results for input(s): GLUCAP in the last 168 hours. HbA1C: No results for input(s): HGBA1C in the last 72 hours. Urine analysis:    Component Value Date/Time   COLORURINE YELLOW 10/20/2020 1542   APPEARANCEUR HAZY (A) 10/20/2020 1542   LABSPEC 1.011 10/20/2020 1542   PHURINE 5.0 10/20/2020 1542   GLUCOSEU 50 (A) 10/20/2020 1542   HGBUR NEGATIVE 10/20/2020 1542   Frannie 10/20/2020 1542   KETONESUR NEGATIVE 10/20/2020 1542   PROTEINUR >=300 (A) 10/20/2020 1542   NITRITE NEGATIVE 10/20/2020 1542   LEUKOCYTESUR NEGATIVE 10/20/2020 1542   Sepsis Labs: @LABRCNTIP (procalcitonin:4,lacticidven:4) ) Recent Results (from the past 240 hour(s))  Resp Panel by RT-PCR (Flu A&B, Covid) Nasopharyngeal Swab     Status: None   Collection Time: 10/20/20 11:43 AM   Specimen: Nasopharyngeal Swab; Nasopharyngeal(NP) swabs in vial transport medium  Result Value Ref Range Status   SARS Coronavirus 2  by RT PCR NEGATIVE NEGATIVE Final    Comment: (NOTE) SARS-CoV-2 target nucleic acids are NOT DETECTED.  The SARS-CoV-2 RNA is generally detectable in upper respiratory specimens during the acute phase of infection. The lowest concentration of SARS-CoV-2 viral copies this assay can detect is 138 copies/mL. A negative result does not preclude SARS-Cov-2 infection and should not be used as the sole basis for treatment or other patient management decisions. A negative result may occur with  improper specimen collection/handling, submission of specimen other than nasopharyngeal swab, presence of viral mutation(s) within the areas targeted by this assay, and inadequate number of viral copies(<138 copies/mL). A negative result must be combined with clinical observations, patient  history, and epidemiological information. The expected result is Negative.  Fact Sheet for Patients:  EntrepreneurPulse.com.au  Fact Sheet for Healthcare Providers:  IncredibleEmployment.be  This test is no t yet approved or cleared by the [place] FDA and  has been authorized for detection and/or diagnosis of SARS-CoV-2 by FDA under an Emergency Use Authorization (EUA). This EUA will remain  in effect (meaning this test can be used) for the duration of the COVID-19 declaration under Section 564(b)(1) of the Act, 21 U.S.C.section 360bbb-3(b)(1), unless the authorization is terminated  or revoked sooner.       Influenza A by PCR NEGATIVE NEGATIVE Final   Influenza B by PCR NEGATIVE NEGATIVE Final    Comment: (NOTE) The Xpert Xpress SARS-CoV-2/FLU/RSV plus assay is intended as an aid in the diagnosis of influenza from Nasopharyngeal swab specimens and should not be used as a sole basis for treatment. Nasal washings and aspirates are unacceptable for Xpert Xpress SARS-CoV-2/FLU/RSV testing.  Fact Sheet for Patients: EntrepreneurPulse.com.au  Fact Sheet for Healthcare Providers: IncredibleEmployment.be  This test is not yet approved or cleared by the [place] FDA and has been authorized for detection and/or diagnosis of SARS-CoV-2 by FDA under an Emergency Use Authorization (EUA). This EUA will remain in effect (meaning this test can be used) for the duration of the COVID-19 declaration under Section 564(b)(1) of the Act, 21 U.S.C. section 360bbb-3(b)(1), unless the authorization is terminated or revoked.  Performed at [facility], [address], [place], [place] [address]   Blood culture (routine x 2)     Status: None (Preliminary result)   Collection Time: 10/20/20 12:18 PM   Specimen: BLOOD  Result Value Ref Range Status   Specimen Description BLOOD  Final   Special Requests NONE  Final    Culture   Final    NO GROWTH 2 DAYS Performed at [facility], [address], [place], [place] [address]    Report Status PENDING  Incomplete  Blood culture (routine x 2)     Status: None (Preliminary result)   Collection Time: 10/20/20 12:59 PM   Specimen: BLOOD RIGHT ARM  Result Value Ref Range Status   Specimen Description BLOOD RIGHT ARM  Final   Special Requests   Final    BOTTLES DRAWN AEROBIC AND ANAEROBIC Blood Culture adequate volume   Culture   Final    NO GROWTH 2 DAYS Performed at [facility], [address] [place], [place] [address]    Report Status PENDING  Incomplete  Urine culture     Status: None   Collection Time: 10/20/20  3:42 PM   Specimen: Urine, Catheterized  Result Value Ref Range Status   Specimen Description   Final    URINE, CATHETERIZED Performed at [facility], [address] [place], [place] [address]  Special Requests   Final    NONE Performed at [facility], [address] [place], [place] [address]    Culture   Final    NO GROWTH Performed at [facility], [place] [address] [place], [place] [address]    Report Status 10/21/2020 FINAL  Final  MRSA PCR Screening     Status: None   Collection Time: 10/20/20  6:42 PM   Specimen: Nasal Mucosa; Nasopharyngeal  Result Value Ref Range Status   MRSA by PCR NEGATIVE NEGATIVE Final    Comment:        The GeneXpert MRSA Assay (FDA approved for NASAL specimens only), is one component of a comprehensive MRSA colonization surveillance program. It is not intended to diagnose MRSA infection nor to guide or monitor treatment for MRSA infections. Performed at [facility], [address] [place], [place] [address]   Culture, respiratory (non-expectorated)     Status: None (Preliminary result)   Collection Time: 10/21/20  8:20 AM   Specimen: Tracheal Aspirate; Respiratory  Result Value Ref Range Status   Specimen Description   Final    TRACHEAL ASPIRATE Performed at [facility],  [address], [place], [place] [address]    Special Requests   Final    NONE Performed at [facility], [address], [place], [place] [address]    Gram Stain   Final    FEW WBC PRESENT, PREDOMINANTLY MONONUCLEAR NO ORGANISMS SEEN    Culture   Final    CULTURE REINCUBATED FOR BETTER GROWTH Performed at [facility], [place] [address], [place], [place] [address]    Report Status PENDING  Incomplete     Scheduled Meds: . Chlorhexidine Gluconate Cloth  6 each Topical Daily  . enoxaparin (LOVENOX) injection  1 mg/kg Subcutaneous Q24H  . pantoprazole (PROTONIX) IV  40 mg Intravenous Q12H  . sodium chloride flush  3 mL Intravenous Q12H   Continuous Infusions: . sodium chloride    . sodium chloride    . propofol (DIPRIVAN) infusion 30 mcg/kg/min (10/22/20 1133)    Procedures/Studies: DG Chest Port 1 View  Result Date: 10/22/2020 CLINICAL DATA:  Acute respiratory failure. EXAM: PORTABLE CHEST 1 VIEW COMPARISON:  10/20/2020 FINDINGS: Tracheostomy tube overlies the airway. The cardiac silhouette is enlarged. There is persistent mild central pulmonary vascular congestion without overt edema. Airspace opacities in the left greater than right mid and lower lungs have slightly improved. Small pleural effusions are questioned. No pneumothorax is identified. IMPRESSION: Slight improvement of bilateral airspace opacities which could reflect pneumonia or atelectasis. Electronically Signed   By: [name] M.D.   On: 10/22/2020 09:16   DG Chest Port 1 View  Result Date: 10/20/2020 CLINICAL DATA:  Questionable sepsis EXAM: PORTABLE CHEST 1 VIEW COMPARISON:  Chest x-ray 06/12/2018 report without images. FINDINGS: Tracheostomy tube terminates approximately 7.5 cm above the carina. The heart size and mediastinal contours are not well visualized. Bilateral hilar vasculature prominence. Aortic arch calcifications. Silhouetting off of the left hemidiaphragm and left cardiac border. Partial silhouetting  off the right cardiac border and right hemidiaphragm. Increased interstitial markings. Likely at least trace to small volume left pleural effusion. No pneumothorax. No acute osseous abnormality. IMPRESSION: 1. Left lung opacity and likely at least trace to small volume left pleural effusion with silhouetting off of the left diaphragm and heart border. Concern for infection/inflammation. 2. Right lower lobe and middle lobe airspace opacity could represent a combination of atelectasis, infection, inflammation. 3. At least  mild pulmonary edema. 4. Consider chest x-ray PA and lateral view for further evaluation. Electronically Signed   By: Iven Finn M.D.   On: 10/20/2020 12:26   ECHOCARDIOGRAM COMPLETE  Result Date: 10/21/2020    ECHOCARDIOGRAM REPORT   Patient Name:   BOBY EYER Date of Exam: 10/21/2020 Medical Rec #:  [id number]        Height:       68.0 in Accession #:    [id number]       Weight:       325.8 lb Date of Birth:  1966-03-24        BSA:          2.515 m Patient Age:    43 years         BP:           112/61 mmHg Patient Gender: M                HR:           51 bpm. Exam Location:  [facility] Procedure: 2D Echo Indications:    CHF-Acute Diastolic Y70.62  History:        Patient has no prior history of Echocardiogram examinations.                 CHF; Risk Factors:Non-Smoker. Acute on chronic respiratory                 failure with hypoxia.  Sonographer:    Leavy Cella RDCS (AE) Referring Phys: [phone] Donnita Farina IMPRESSIONS  1. Left ventricular ejection fraction, by estimation, is 60 to 65%. The left ventricle has normal function. The left ventricle has no regional wall motion abnormalities. There is severe left ventricular hypertrophy. Left ventricular diastolic parameters  are indeterminate. There is the interventricular septum is flattened in systole and diastole, consistent with right ventricular pressure and volume overload.  2. Right ventricular systolic function is mildly reduced.  The right ventricular size is mildly enlarged. Tricuspid regurgitation signal is inadequate for assessing PA pressure.  3. Right atrial size was upper normal.  4. The pericardial effusion is posterior to the left ventricle.  5. The mitral valve is grossly normal. Trivial mitral valve regurgitation.  6. The aortic valve is tricuspid. Aortic valve regurgitation is not visualized.  7. The inferior vena cava is normal in size with greater than 50% respiratory variability, suggesting right atrial pressure of 3 mmHg. FINDINGS  Left Ventricle: Left ventricular ejection fraction, by estimation, is 60 to 65%. The left ventricle has normal function. The left ventricle has no regional wall motion abnormalities. The left ventricular internal cavity size was normal in size. There is  severe left ventricular hypertrophy. The interventricular septum is flattened in systole and diastole, consistent with right ventricular pressure and volume overload. Left ventricular diastolic parameters are indeterminate. Right Ventricle: The right ventricular size is mildly enlarged. No increase in right ventricular wall thickness. Right ventricular systolic function is mildly reduced. Tricuspid regurgitation signal is inadequate for assessing PA pressure. Left Atrium: Left atrial size was normal in size. Right Atrium: Right atrial size was upper normal. Pericardium: Trivial pericardial effusion is present. The pericardial effusion is posterior to the left ventricle. Mitral Valve: The mitral valve is grossly normal. Trivial mitral valve regurgitation. Tricuspid Valve: The tricuspid valve is grossly normal. Tricuspid valve regurgitation is mild. Aortic Valve: The aortic valve is tricuspid. There is moderate aortic valve annular calcification. Aortic valve regurgitation is not visualized. Pulmonic Valve: The  pulmonic valve was grossly normal. Pulmonic valve regurgitation is trivial. Aorta: The aortic root is normal in size and structure. Venous:  The inferior vena cava is normal in size with greater than 50% respiratory variability, suggesting right atrial pressure of 3 mmHg. IAS/Shunts: No atrial level shunt detected by color flow Doppler.  LEFT VENTRICLE PLAX 2D LVIDd:         4.66 cm  Diastology LVIDs:         2.03 cm  LV e' medial:    5.87 cm/s LV PW:         1.76 cm  LV E/e' medial:  14.0 LV IVS:        1.73 cm  LV e' lateral:   8.49 cm/s LVOT diam:     1.90 cm  LV E/e' lateral: 9.7 LVOT Area:     2.84 cm  RIGHT VENTRICLE RV S prime:     19.40 cm/s TAPSE (M-mode): 1.9 cm LEFT ATRIUM             Index       RIGHT ATRIUM           Index LA diam:        3.50 cm 1.39 cm/m  RA Area:     24.60 cm LA Vol (A2C):   43.9 ml 17.46 ml/m RA Volume:   82.30 ml  32.73 ml/m LA Vol (A4C):   43.8 ml 17.42 ml/m LA Biplane Vol: 44.4 ml 17.66 ml/m   AORTA Ao Root diam: 3.10 cm MITRAL VALVE MV Area (PHT): 2.64 cm    SHUNTS MV Decel Time: 287 msec    Systemic Diam: 1.90 cm MV E velocity: 82.30 cm/s MV A velocity: 59.10 cm/s MV E/A ratio:  1.39 Rozann Lesches MD Electronically signed by Rozann Lesches MD Signature Date/Time: 10/21/2020/4:33:43 PM    Final     [name], DO  [facility]  If 7PM-7AM, please contact night-coverage [email or web address] Password [email or web address] 10/22/2020, 2:22 PM   LOS: 2 days

## 2020-10-22 NOTE — Progress Notes (Signed)
ANTICOAGULATION CONSULT NOTE - Initial Consult  Pharmacy Consult for lovenox Indication: atrial fibrillation  No Known Allergies  Patient Measurements: Height: 5\' 8"  (172.7 cm) Weight: (!) 149 kg (328 lb 7.8 oz) IBW/kg (Calculated) : 68.4 Heparin Dosing Weight:   Vital Signs: Temp: 99.2 F (37.3 C) (12/17 1200) Temp Source: Oral (12/17 1200) BP: 127/69 (12/17 1300) Pulse Rate: 69 (12/17 1300)  Labs: Recent Labs    10/20/20 1217 10/20/20 1224 10/20/20 1259 10/20/20 1454 10/21/20 0403 10/22/20 0512  HGB 14.5  --   --   --   --  12.5*  HCT 49.8  --   --   --   --  41.7  PLT 353  --   --   --   --  269  APTT  --   --  36  --   --   --   LABPROT  --   --  17.3*  --   --   --   INR  --   --  1.5*  --   --   --   CREATININE 2.43* 2.40*  --   --  2.76* 3.39*  TROPONINIHS 9  --   --  9  --   --     Estimated Creatinine Clearance: 35.4 mL/min (A) (by C-G formula based on SCr of 3.39 mg/dL (H)).   Medical History: Past Medical History:  Diagnosis Date  . CKD (chronic kidney disease)   . Congestive heart failure (CHF) ([place])   . Essential hypertension   . Fatigue   . Gout   . Hypercholesteremia   . Insomnia   . Proteinuria   . Renal insufficiency   . Shortness of breath   . 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 amLODipine (NORVASC) 10 MG tablet Take 10 mg by mouth daily.     Marland Kitchen albuterol (VENTOLIN HFA) 108 (90 Base) MCG/ACT inhaler Inhale 1 puff into the lungs See admin instructions. Inhale 1 puff into the lungs every 4 to 6 hours as needed for shortness of breath     . allopurinol (ZYLOPRIM) 300 MG tablet Take 300 mg by mouth daily.     Marland Kitchen apixaban (ELIQUIS) 2.5 MG TABS tablet Take by mouth.     Marland Kitchen aspirin EC 81 MG tablet Take 81 mg by mouth daily.     Marland Kitchen buPROPion (WELLBUTRIN SR) 100 MG 12 hr tablet Take by mouth.     . Cholecalciferol 25 MCG (1000 UT) tablet Take by mouth.     .  clonazePAM (KLONOPIN) 0.5 MG tablet Take by mouth.     . colchicine-probenecid 0.5-500 MG tablet Take by mouth.     . ferrous sulfate 325 (65 FE) MG tablet Take by mouth.     . furosemide (LASIX) 40 MG tablet Take 40 mg by mouth.     . hydrALAZINE (APRESOLINE) 25 MG tablet Take 25 mg by mouth 3 (three) times daily.     Marland Kitchen levofloxacin (LEVAQUIN) 500 MG tablet Take 500 mg by mouth daily.     Marland Kitchen lisinopril (PRINIVIL,ZESTRIL) 40 MG tablet Take 40 mg by mouth daily.     . meloxicam (MOBIC) 15 MG tablet Take 15 mg by mouth daily.     . metoprolol succinate (TOPROL-XL) 50 MG 24 hr tablet Take 50 mg by mouth daily. Take with or immediately following a meal.     . mirtazapine (  REMERON) 30 MG tablet Take 30 mg by mouth at bedtime.     . Multiple Vitamins-Minerals (MULTIVITAMIN WITH MINERALS) tablet Take by mouth.     . torsemide (DEMADEX) 20 MG tablet Take by mouth.     . traMADol (ULTRAM) 50 MG tablet Take 50 mg by mouth 3 (three) times daily as needed.       Assessment: Pharmacy consulted to dose lovenox for patient with atrial fibrillation.  Normalized CrCl ~ 25 mL/min  Goal of Therapy:   Monitor platelets by anticoagulation protocol: Yes   Plan:  Lovenox 150 mg subq every 24 hours. Monitor labs and s/s of bleeding   Jack Cox, PharmD Clinical Pharmacist 10/22/2020 1:47 PM

## 2020-10-23 ENCOUNTER — Inpatient Hospital Stay (HOSPITAL_COMMUNITY): Payer: Medicare Other

## 2020-10-23 LAB — CBC
HCT: 45.3 % (ref 39.0–52.0)
Hemoglobin: 13.6 g/dL (ref 13.0–17.0)
MCH: 29.9 pg (ref 26.0–34.0)
MCHC: 30 g/dL (ref 30.0–36.0)
MCV: 99.6 fL (ref 80.0–100.0)
Platelets: 261 10*3/uL (ref 150–400)
RBC: 4.55 MIL/uL (ref 4.22–5.81)
RDW: 13.9 % (ref 11.5–15.5)
WBC: 6.5 10*3/uL (ref 4.0–10.5)
nRBC: 0 % (ref 0.0–0.2)

## 2020-10-23 LAB — GLUCOSE, CAPILLARY
Glucose-Capillary: 103 mg/dL — ABNORMAL HIGH (ref 70–99)
Glucose-Capillary: 116 mg/dL — ABNORMAL HIGH (ref 70–99)
Glucose-Capillary: 79 mg/dL (ref 70–99)
Glucose-Capillary: 82 mg/dL (ref 70–99)
Glucose-Capillary: 82 mg/dL (ref 70–99)
Glucose-Capillary: 87 mg/dL (ref 70–99)
Glucose-Capillary: 89 mg/dL (ref 70–99)

## 2020-10-23 LAB — BASIC METABOLIC PANEL
Anion gap: 12 (ref 5–15)
BUN: 75 mg/dL — ABNORMAL HIGH (ref 6–20)
CO2: 30 mmol/L (ref 22–32)
Calcium: 8.6 mg/dL — ABNORMAL LOW (ref 8.9–10.3)
Chloride: 99 mmol/L (ref 98–111)
Creatinine, Ser: 3.46 mg/dL — ABNORMAL HIGH (ref 0.61–1.24)
GFR, Estimated: 20 mL/min — ABNORMAL LOW (ref >60)
Glucose, Bld: 74 mg/dL (ref 70–99)
Potassium: 3.8 mmol/L (ref 3.5–5.1)
Sodium: 141 mmol/L (ref 135–145)

## 2020-10-23 LAB — CULTURE, RESPIRATORY W GRAM STAIN: Culture: NORMAL

## 2020-10-23 LAB — MAGNESIUM: Magnesium: 2 mg/dL (ref 1.7–2.4)

## 2020-10-23 LAB — PHOSPHORUS: Phosphorus: 5.1 mg/dL — ABNORMAL HIGH (ref 2.5–4.6)

## 2020-10-23 MED ORDER — VITAL HIGH PROTEIN PO LIQD
1000.0000 mL | ORAL | Status: DC
  Administered 2020-10-23 – 2020-11-11 (×9): 1000 mL

## 2020-10-23 MED ORDER — FUROSEMIDE 10 MG/ML IJ SOLN
80.0000 mg | Freq: Once | INTRAMUSCULAR | Status: AC
  Administered 2020-10-23: 80 mg via INTRAVENOUS
  Filled 2020-10-23: qty 8

## 2020-10-23 MED ORDER — ALBUMIN HUMAN 25 % IV SOLN
12.5000 g | Freq: Once | INTRAVENOUS | Status: AC
  Administered 2020-10-23: 12.5 g via INTRAVENOUS
  Filled 2020-10-23: qty 50

## 2020-10-23 MED ORDER — FUROSEMIDE 10 MG/ML IJ SOLN
40.0000 mg | Freq: Once | INTRAMUSCULAR | Status: AC
  Administered 2020-10-23: 40 mg via INTRAVENOUS
  Filled 2020-10-23: qty 4

## 2020-10-23 MED ORDER — ALBUMIN HUMAN 25 % IV SOLN
12.5000 g | Freq: Once | INTRAVENOUS | Status: AC
  Administered 2020-10-24: 12.5 g via INTRAVENOUS
  Filled 2020-10-23: qty 50

## 2020-10-23 MED ORDER — FUROSEMIDE 10 MG/ML IJ SOLN: 80.0000 mg | Freq: Every day | INTRAMUSCULAR | Status: DC

## 2020-10-23 MED ORDER — FUROSEMIDE 10 MG/ML IJ SOLN
80.0000 mg | Freq: Once | INTRAMUSCULAR | Status: AC
  Administered 2020-10-24: 80 mg via INTRAVENOUS
  Filled 2020-10-23: qty 8

## 2020-10-23 NOTE — Progress Notes (Signed)
PROGRESS NOTE  Jack Cox JOA:[id number] DOB: 06-23-1966 DOA: 10/20/2020 PCP: [name], MD  Brief History:  54 y.o.malewith medical history ofdiastolic CHF, atrial flutter, obstructive sleep apnea, hypertension, right bundle branch block presenting with shortness of breath for the last 2 to 3 days. Unfortunately, the patient is encephalopathic at the time of my evaluation; therefore, history is limited. Apparently, the patient went to see his nephrologist earlier on 10/20/2020. He was noted to have oxygen saturation in the 60s. As result, he was sent to the emergency department for further evaluation. Initially, the patient was awake and conversant with oxygen saturation in the low 70s. He was placed on a nonrebreather after which the patient became obtunded. VBG showed pH 7.1 66/97/106 on 80%. Subsequently, the patient was placed on the ventilator with PRVC,PEEP of 5, FiO2 70%. After approximately 10 to 15 minutes on the ventilator, the patient remained somnolent, but awakened and was able to tell me his name. He did state that he has not been taking his torsemide as directed. In addition, the patient has been sleeping in a recliner for an unclear duration of time secondary to shortness of breath. At baseline, the patient has a size 4 noncuffed tracheostomy. He is on supplemental oxygen 2.5 L during the day and 4 L at nighttime. There is no reports of nausea, vomiting, diarrhea, chest pain, abdominal pain. In the emergency department, the patient was afebrile and hemodynamically stable with oxygen saturation 93-94% on the ventilator with FiO2 70%. Chest x-ray showed left-sided pleural effusion with increased interstitial markings. BNP 790. PCT 0.12. BMP showed a serum creatinine 2.43. LFTs were unremarkable. WBC 8.1, hemoglobin 14.5, platelets 253,000. The patient was given ceftriaxone and azithromycin. He was also given furosemide 80 mg IV x1. COVID-19 RT-PCR  negative   Assessment/Plan: Acute on chronic respiratory failure with hypoxia and hypercarbia -Secondary to CHF in the setting of OSA -remainson ventilator -personally reviewed CXR--increased interstitial markings -Vent settings PC 16, PEEP 10 FiO275% -Pulmonary consultappreciated -PCT 0.12>>0.61 -having mucus plugging--needs aggressive suctioning -continue mucomyst and albuterol -repeat CXR  Acute on chronic diastolic CHF/Cor Pulmonale -06/25/2019 echo EF 55%, trivial TR -10/21/20 echo--EF 60-65%, no WMA, RV pressure overload, mild TR -ContinueIV furosemide 80 mg IV twice daily-->held by PCCM after am 12/17 dose due to increasing serum cr -pt remains clinically fluid overloaded -Daily BMP -Accurate I's and O's -Daily weights  Atrial flutter -Continue apixaban if awake enough to tolerate p.o. -rate controlled -IV metoprolol if has RVR  Acute on chronic CKD stage IV -Baseline creatinine 2.4-2.7 -Monitor with diuresis -will need tolerate increased serum creatinine for diuresis -consult nephrology  FEN -having mild hypoglycemia -NG placed 12/17 -switch to Vital High Protein and increase rate to 40 cc/hr  Essential hypertension -Anticipate improvement with diuresis -Continue metoprolol  OSA -The patient has a tracheostomy placed in 2001 -He has a size 4 cuffless trach  Right bundle branch block -This has been chronic wound with review of the medical records  Morbid obesity -BMI 52.94 -lifestyle modification      Status is: Inpatient  Remains inpatient appropriate because:IV treatments appropriate due to intensity of illness or inability to take PO   Dispo: The patient is from:Home Anticipated d/c is SW:FUXN Anticipated d/c date is: 3 days Patient currently is not medically stable to d/c.        Family Communication:brother updated bedside 12/17  Consultants:pulm  Code  Status: FULL  DVT Prophylaxis: [place] Lovenox--treatment  dose   Procedures: As Listed in Progress Note Above  Antibiotics: None   The patient is critically ill with multiple organ systems failure and requires high complexity decision making for assessment and support, frequent evaluation and titration of therapies, application of advanced monitoring technologies and extensive interpretation of multiple databases.  Critical care time - 35 mins.       Subjective: Events of last evening reviewed.  Patient is awake and alert.  Complains of some sob.  Denies cp, abd pain.  On diprivan  Objective: Vitals:   10/23/20 0900 10/23/20 1000 10/23/20 1119 10/23/20 1124  BP: 137/73 (!) 155/85    Pulse: 77 81  80  Resp: (!) 21 (!) 26  (!) 22  Temp:    99.9 F (37.7 C)  TempSrc:    Oral  SpO2: 93% 95% 91% 91%  Weight:      Height:        Intake/Output Summary (Last 24 hours) at 10/23/2020 1211 Last data filed at 10/23/2020 1000 Gross per 24 hour  Intake 810.8 ml  Output 1150 ml  Net -339.2 ml   Weight change: -11.9 kg Exam:   General:  Pt is alert, follows commands appropriately, not in acute distress  HEENT: No icterus, No thrush, No neck mass, Chicken/AT  Cardiovascular: RRR, S1/S2, no rubs, no gallops  Respiratory: bilateral rhonchi  Abdomen: Soft/+BS, non tender, non distended, no guarding  Extremities: 3+LE edema, No lymphangitis, No petechiae, No rashes, no synovitis   Data Reviewed: I have personally reviewed following labs and imaging studies Basic Metabolic Panel: Recent Labs  Lab 10/20/20 1217 10/20/20 1224 10/21/20 0403 10/22/20 0512 10/23/20 0358  NA 139  --  139 142 141  K 4.5  --  4.2 4.1 3.8  CL 100  --  99 100 99  CO2 33*  --  32 32 30  GLUCOSE 97  --  88 62* 74  BUN 61*  --  66* 74* 75*  CREATININE 2.43* 2.40* 2.76* 3.39* 3.46*  CALCIUM 9.2  --  8.8* 8.5* 8.6*  MG  --   --   --   --  2.0   Liver Function Tests: Recent Labs  Lab  10/20/20 1217 10/22/20 0512  AST 33 22  ALT 30 20  ALKPHOS 114 84  BILITOT 0.4 1.0  PROT 7.9 6.2*  ALBUMIN 3.2* 2.3*   No results for input(s): LIPASE, AMYLASE in the last 168 hours. No results for input(s): AMMONIA in the last 168 hours. Coagulation Profile: Recent Labs  Lab 10/20/20 1259  INR 1.5*   CBC: Recent Labs  Lab 10/20/20 1217 10/22/20 0512 10/23/20 0358  WBC 8.1 5.6 6.5  NEUTROABS 6.2  --   --   HGB 14.5 12.5* 13.6  HCT 49.8 41.7 45.3  MCV 101.8* 99.8 99.6  PLT 353 269 261   Cardiac Enzymes: No results for input(s): CKTOTAL, CKMB, CKMBINDEX, TROPONINI in the last 168 hours. BNP: Invalid input(s): POCBNP CBG: Recent Labs  Lab 10/22/20 2227 10/23/20 0123 10/23/20 0414 10/23/20 0746 10/23/20 1123  GLUCAP 94 79 82 82 87   HbA1C: No results for input(s): HGBA1C in the last 72 hours. Urine analysis:    Component Value Date/Time   COLORURINE YELLOW 10/20/2020 1542   APPEARANCEUR HAZY (A) 10/20/2020 1542   LABSPEC 1.011 10/20/2020 1542   PHURINE 5.0 10/20/2020 1542   GLUCOSEU 50 (A) 10/20/2020 1542   HGBUR NEGATIVE 10/20/2020 [place] 10/20/2020 1542   KETONESUR  NEGATIVE 10/20/2020 1542   PROTEINUR >=300 (A) 10/20/2020 1542   NITRITE NEGATIVE 10/20/2020 1542   LEUKOCYTESUR NEGATIVE 10/20/2020 1542   Sepsis Labs: '@LABRCNTIP' (procalcitonin:4,lacticidven:4) ) Recent Results (from the past 240 hour(s))  Resp Panel by RT-PCR (Flu A&B, Covid) Nasopharyngeal Swab     Status: None   Collection Time: 10/20/20 11:43 AM   Specimen: Nasopharyngeal Swab; Nasopharyngeal(NP) swabs in vial transport medium  Result Value Ref Range Status   SARS Coronavirus 2 by RT PCR NEGATIVE NEGATIVE Final    Comment: (NOTE) SARS-CoV-2 target nucleic acids are NOT DETECTED.  The SARS-CoV-2 RNA is generally detectable in upper respiratory specimens during the acute phase of infection. The lowest concentration of SARS-CoV-2 viral copies this assay can  detect is 138 copies/mL. A negative result does not preclude SARS-Cov-2 infection and should not be used as the sole basis for treatment or other patient management decisions. A negative result may occur with  improper specimen collection/handling, submission of specimen other than nasopharyngeal swab, presence of viral mutation(s) within the areas targeted by this assay, and inadequate number of viral copies(<138 copies/mL). A negative result must be combined with clinical observations, patient history, and epidemiological information. The expected result is Negative.  Fact Sheet for Patients:  EntrepreneurPulse.com.au  Fact Sheet for Healthcare Providers:  IncredibleEmployment.be  This test is no t yet approved or cleared by the [place] FDA and  has been authorized for detection and/or diagnosis of SARS-CoV-2 by FDA under an Emergency Use Authorization (EUA). This EUA will remain  in effect (meaning this test can be used) for the duration of the COVID-19 declaration under Section 564(b)(1) of the Act, 21 U.S.C.section 360bbb-3(b)(1), unless the authorization is terminated  or revoked sooner.       Influenza A by PCR NEGATIVE NEGATIVE Final   Influenza B by PCR NEGATIVE NEGATIVE Final    Comment: (NOTE) The Xpert Xpress SARS-CoV-2/FLU/RSV plus assay is intended as an aid in the diagnosis of influenza from Nasopharyngeal swab specimens and should not be used as a sole basis for treatment. Nasal washings and aspirates are unacceptable for Xpert Xpress SARS-CoV-2/FLU/RSV testing.  Fact Sheet for Patients: EntrepreneurPulse.com.au  Fact Sheet for Healthcare Providers: IncredibleEmployment.be  This test is not yet approved or cleared by the [place] FDA and has been authorized for detection and/or diagnosis of SARS-CoV-2 by FDA under an Emergency Use Authorization (EUA). This EUA will remain in  effect (meaning this test can be used) for the duration of the COVID-19 declaration under Section 564(b)(1) of the Act, 21 U.S.C. section 360bbb-3(b)(1), unless the authorization is terminated or revoked.  Performed at [facility], [address], [place], [place] [address]   Blood culture (routine x 2)     Status: None (Preliminary result)   Collection Time: 10/20/20 12:18 PM   Specimen: BLOOD  Result Value Ref Range Status   Specimen Description BLOOD  Final   Special Requests NONE  Final   Culture   Final    NO GROWTH 3 DAYS Performed at [facility], [address] [place], [place] [address]    Report Status PENDING  Incomplete  Blood culture (routine x 2)     Status: None (Preliminary result)   Collection Time: 10/20/20 12:59 PM   Specimen: BLOOD RIGHT ARM  Result Value Ref Range Status   Specimen Description BLOOD RIGHT ARM  Final   Special Requests   Final    BOTTLES DRAWN AEROBIC AND ANAEROBIC Blood Culture adequate volume   Culture  Final    NO GROWTH 3 DAYS Performed at [facility], [address], [place], [place] [address]    Report Status PENDING  Incomplete  Urine culture     Status: None   Collection Time: 10/20/20  3:42 PM   Specimen: Urine, Catheterized  Result Value Ref Range Status   Specimen Description   Final    URINE, CATHETERIZED Performed at [facility], [address], [place], [place] [address]    Special Requests   Final    NONE Performed at [facility], [address], [place], [place] [address]    Culture   Final    NO GROWTH Performed at [facility], [place] [address] [place], [place] [address]    Report Status 10/21/2020 FINAL  Final  MRSA PCR Screening     Status: None   Collection Time: 10/20/20  6:42 PM   Specimen: Nasal Mucosa; Nasopharyngeal  Result Value Ref Range Status   MRSA by PCR NEGATIVE NEGATIVE Final    Comment:        The GeneXpert MRSA Assay (FDA approved for NASAL specimens only), is one component of  a comprehensive MRSA colonization surveillance program. It is not intended to diagnose MRSA infection nor to guide or monitor treatment for MRSA infections. Performed at [facility], [address] [place], [place] [address]   Culture, respiratory (non-expectorated)     Status: None (Preliminary result)   Collection Time: 10/21/20  8:20 AM   Specimen: Tracheal Aspirate; Respiratory  Result Value Ref Range Status   Specimen Description   Final    TRACHEAL ASPIRATE Performed at [facility], [address] [place], [place] [address]    Special Requests   Final    NONE Performed at [facility], [address], [place], [place] [address]    Gram Stain   Final    FEW WBC PRESENT, PREDOMINANTLY MONONUCLEAR NO ORGANISMS SEEN    Culture   Final    CULTURE REINCUBATED FOR BETTER GROWTH Performed at [facility], [place] [address] [place], [place] [address]    Report Status PENDING  Incomplete     Scheduled Meds: . acetylcysteine  1 mL Nebulization QID  . albuterol  2.5 mg Nebulization QID  . chlorhexidine gluconate (MEDLINE KIT)  15 mL Mouth Rinse BID  . Chlorhexidine Gluconate Cloth  6 each Topical Daily  . enoxaparin (LOVENOX) injection  1 mg/kg Subcutaneous Q24H  . furosemide  80 mg Intravenous Daily  . mouth rinse  15 mL Mouth Rinse 10 times per day  . pantoprazole (PROTONIX) IV  40 mg Intravenous Q12H  . sodium chloride flush  3 mL Intravenous Q12H   Continuous Infusions: . sodium chloride    . sodium chloride    . feeding supplement (VITAL AF 1.2 CAL) 1,000 mL (10/22/20 1749)  . feeding supplement (VITAL HIGH PROTEIN)    . propofol (DIPRIVAN) infusion 30 mcg/kg/min (10/23/20 1107)    Procedures/Studies: DG Chest Port 1 View  Result Date: 10/22/2020 CLINICAL DATA:  Acute respiratory failure. EXAM: PORTABLE CHEST 1 VIEW COMPARISON:  10/20/2020 FINDINGS: Tracheostomy tube overlies the airway. The cardiac silhouette is enlarged. There is persistent mild central  pulmonary vascular congestion without overt edema. Airspace opacities in the left greater than right mid and lower lungs have slightly improved. Small pleural effusions are questioned. No pneumothorax is identified. IMPRESSION: Slight improvement of bilateral airspace opacities which could reflect pneumonia or atelectasis. Electronically Signed   By: Zenia Resides  Jeralyn Ruths M.D.   On: 10/22/2020 09:16   DG Chest Port 1 View  Result Date: 10/20/2020 CLINICAL DATA:  Questionable sepsis EXAM: PORTABLE CHEST 1 VIEW COMPARISON:  Chest x-ray 06/12/2018 report without images. FINDINGS: Tracheostomy tube terminates approximately 7.5 cm above the carina. The heart size and mediastinal contours are not well visualized. Bilateral hilar vasculature prominence. Aortic arch calcifications. Silhouetting off of the left hemidiaphragm and left cardiac border. Partial silhouetting off the right cardiac border and right hemidiaphragm. Increased interstitial markings. Likely at least trace to small volume left pleural effusion. No pneumothorax. No acute osseous abnormality. IMPRESSION: 1. Left lung opacity and likely at least trace to small volume left pleural effusion with silhouetting off of the left diaphragm and heart border. Concern for infection/inflammation. 2. Right lower lobe and middle lobe airspace opacity could represent a combination of atelectasis, infection, inflammation. 3. At least mild pulmonary edema. 4. Consider chest x-ray PA and lateral view for further evaluation. Electronically Signed   By: Iven Finn M.D.   On: 10/20/2020 12:26   DG Chest Port 1V same Day  Result Date: 10/22/2020 CLINICAL DATA:  Status post NG tube placement. EXAM: PORTABLE CHEST 1 VIEW COMPARISON:  Single-view of the chest earlier today. FINDINGS: The upper chest is off the margin of the film. NG tube is looped in the upper trachea or esophagus. Visualized left chest is completely whited out on this study. IMPRESSION: NG tube is looped in  the upper esophagus or trachea. Complete whiteout of the left chest is new since the study earlier today could be due to mucous plugging and left lung collapse or rapidly developing pleural effusion and airspace disease. Electronically Signed   By: [name] M.D.   On: 10/22/2020 15:16   DG Abd Portable 1V  Result Date: 10/22/2020 CLINICAL DATA:  Nasogastric tube placement EXAM: PORTABLE ABDOMEN - 1 VIEW COMPARISON:  Portable exam 1613 hours compared to 1523 hours FINDINGS: Tip of nasogastric tube projects over distal gastric antrum, consider withdrawal 5 cm. Bowel gas pattern normal. Opacification of the inferior LEFT hemithorax again seen. IMPRESSION: Tip of nasogastric tube projects over distal gastric antrum. Consider withdrawal of nasogastric tube 5 cm. Electronically Signed   By: [name] M.D.   On: 10/22/2020 16:36   DG Abd Portable 1V  Result Date: 10/22/2020 CLINICAL DATA:  Nasogastric tube placement EXAM: PORTABLE ABDOMEN - 1 VIEW COMPARISON:  None. FINDINGS: Nasogastric tube extends into the gastric cardia where it loops upon itself with the tip at the junction of the mid and distal thirds of the esophagus. There is no bowel dilatation or air-fluid level to suggest bowel obstruction. No free air. There is consolidation left lower lobe. IMPRESSION: Nasogastric tube extends into the stomach where it loops upon itself with the tip at the junction of the mid and distal thirds of the esophagus. No bowel obstruction or free air evident. Consolidation left lower lobe. These results will be called to the ordering clinician or representative by the Radiologist Assistant, and communication documented in the PACS or [employer]. Electronically Signed   By: Lowella Grip III M.D.   On: 10/22/2020 15:34   ECHOCARDIOGRAM COMPLETE  Result Date: 10/21/2020    ECHOCARDIOGRAM REPORT   Patient Name:   Jack Cox Date of Exam: 10/21/2020 Medical Rec #:  [id number]        Height:        68.0 in Accession #:    [id number]       Weight:  325.8 lb Date of Birth:  01/21/1966        BSA:          2.515 m Patient Age:    54 years         BP:           112/61 mmHg Patient Gender: M                HR:           51 bpm. Exam Location:  [facility] Procedure: 2D Echo Indications:    CHF-Acute Diastolic S92.33  History:        Patient has no prior history of Echocardiogram examinations.                 CHF; Risk Factors:Non-Smoker. Acute on chronic respiratory                 failure with hypoxia.  Sonographer:    Leavy Cella RDCS (AE) Referring Phys: [phone] Dalinda Heidt IMPRESSIONS  1. Left ventricular ejection fraction, by estimation, is 60 to 65%. The left ventricle has normal function. The left ventricle has no regional wall motion abnormalities. There is severe left ventricular hypertrophy. Left ventricular diastolic parameters  are indeterminate. There is the interventricular septum is flattened in systole and diastole, consistent with right ventricular pressure and volume overload.  2. Right ventricular systolic function is mildly reduced. The right ventricular size is mildly enlarged. Tricuspid regurgitation signal is inadequate for assessing PA pressure.  3. Right atrial size was upper normal.  4. The pericardial effusion is posterior to the left ventricle.  5. The mitral valve is grossly normal. Trivial mitral valve regurgitation.  6. The aortic valve is tricuspid. Aortic valve regurgitation is not visualized.  7. The inferior vena cava is normal in size with greater than 50% respiratory variability, suggesting right atrial pressure of 3 mmHg. FINDINGS  Left Ventricle: Left ventricular ejection fraction, by estimation, is 60 to 65%. The left ventricle has normal function. The left ventricle has no regional wall motion abnormalities. The left ventricular internal cavity size was normal in size. There is  severe left ventricular hypertrophy. The interventricular septum is flattened in systole and  diastole, consistent with right ventricular pressure and volume overload. Left ventricular diastolic parameters are indeterminate. Right Ventricle: The right ventricular size is mildly enlarged. No increase in right ventricular wall thickness. Right ventricular systolic function is mildly reduced. Tricuspid regurgitation signal is inadequate for assessing PA pressure. Left Atrium: Left atrial size was normal in size. Right Atrium: Right atrial size was upper normal. Pericardium: Trivial pericardial effusion is present. The pericardial effusion is posterior to the left ventricle. Mitral Valve: The mitral valve is grossly normal. Trivial mitral valve regurgitation. Tricuspid Valve: The tricuspid valve is grossly normal. Tricuspid valve regurgitation is mild. Aortic Valve: The aortic valve is tricuspid. There is moderate aortic valve annular calcification. Aortic valve regurgitation is not visualized. Pulmonic Valve: The pulmonic valve was grossly normal. Pulmonic valve regurgitation is trivial. Aorta: The aortic root is normal in size and structure. Venous: The inferior vena cava is normal in size with greater than 50% respiratory variability, suggesting right atrial pressure of 3 mmHg. IAS/Shunts: No atrial level shunt detected by color flow Doppler.  LEFT VENTRICLE PLAX 2D LVIDd:         4.66 cm  Diastology LVIDs:         2.03 cm  LV e' medial:    5.87 cm/s LV PW:  1.76 cm  LV E/e' medial:  14.0 LV IVS:        1.73 cm  LV e' lateral:   8.49 cm/s LVOT diam:     1.90 cm  LV E/e' lateral: 9.7 LVOT Area:     2.84 cm  RIGHT VENTRICLE RV S prime:     19.40 cm/s TAPSE (M-mode): 1.9 cm LEFT ATRIUM             Index       RIGHT ATRIUM           Index LA diam:        3.50 cm 1.39 cm/m  RA Area:     24.60 cm LA Vol (A2C):   43.9 ml 17.46 ml/m RA Volume:   82.30 ml  32.73 ml/m LA Vol (A4C):   43.8 ml 17.42 ml/m LA Biplane Vol: 44.4 ml 17.66 ml/m   AORTA Ao Root diam: 3.10 cm MITRAL VALVE MV Area (PHT): 2.64 cm     SHUNTS MV Decel Time: 287 msec    Systemic Diam: 1.90 cm MV E velocity: 82.30 cm/s MV A velocity: 59.10 cm/s MV E/A ratio:  1.39 Rozann Lesches MD Electronically signed by Rozann Lesches MD Signature Date/Time: 10/21/2020/4:33:43 PM    Final     [name], DO  [facility]  If 7PM-7AM, please contact night-coverage [email or web address] Password [email or web address] 10/23/2020, 12:11 PM   LOS: 3 days

## 2020-10-23 NOTE — Consult Note (Signed)
Reason for Consult: Acute kidney injury on chronic kidney disease stage IIIb/IV Referring Physician: [name], MD [facility])  When the patient was seen, he was on the ventilator via tracheostomy and other than nodding to questions, unable to give additional history.  History pieced together from previous records.  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 Theador Cox for routine nephrology care.  He was referred to the emergency room from Dr. Toya Cox office after being noticed to have hypoxia in the 60s.  He was placed on a nonrebreather mask in the emergency room after noticing hypoxia however, developed altered mental status with severe hypercarbia and was placed on the ventilator.  He apparently was not adherent with his prescribed torsemide and duration of progressive shortness of breath prior to admission is thought to be several days.  He did not have any other complaints including fever, chills, cough/sputum production, nausea, vomiting, diarrhea or chest pain prior to admission.  He did have progressively swelling arms and legs.  With initiation of diuretics for volume unloading, renal function is noted to be progressively worsening prompting nephrology consult.   10/21/2020  10/22/2020  10/23/2020   BUN 66 (H) 74 (H) 75 (H)  Creatinine 2.76 (H) 3.39 (H) 3.46 (H)    Past Medical History:  Diagnosis Date  . CKD (chronic kidney disease)   . Congestive heart failure (CHF) ([place])   . Essential hypertension   . Fatigue   . Gout   . Hypercholesteremia   . Insomnia   . Proteinuria   . Renal insufficiency   . Shortness of breath   . Tracheostomy dependence ([place])     History reviewed. No  pertinent surgical history.  Family History  Problem Relation Age of Onset  . Stomach cancer Mother   . Colon cancer Father     Social History:  reports that he has never smoked. He has never used smokeless tobacco. He reports that he does not drink alcohol and does not use drugs.  Allergies: No Known Allergies  Medications:  Scheduled: . acetylcysteine  1 mL Nebulization QID  . albuterol  2.5 mg Nebulization QID  . chlorhexidine gluconate (MEDLINE KIT)  15 mL Mouth Rinse BID  . Chlorhexidine Gluconate Cloth  6 each Topical Daily  . enoxaparin (LOVENOX) injection  1 mg/kg Subcutaneous Q24H  . furosemide  80 mg Intravenous Daily  . mouth rinse  15 mL Mouth Rinse 10 times per day  . pantoprazole (PROTONIX) IV  40 mg Intravenous Q12H  . sodium chloride flush  3 mL Intravenous Q12H    BMP Latest Ref Rng & Units 10/23/2020 10/22/2020 10/21/2020  Glucose 70 - 99 mg/dL 74 62(L) 88  BUN 6 - 20 mg/dL 75(H) 74(H) 66(H)  Creatinine 0.61 - 1.24 mg/dL 3.46(H) 3.39(H) 2.76(H)  Sodium 135 - 145 mmol/L 141 142 139  Potassium 3.5 - 5.1 mmol/L 3.8 4.1 4.2  Chloride 98 - 111 mmol/L 99 100 99  CO2 22 - 32 mmol/L 30 32 32  Calcium 8.9 - 10.3 mg/dL 8.6(L) 8.5(L) 8.8(L)   CBC Latest Ref Rng & Units 10/23/2020 10/22/2020 10/20/2020  WBC 4.0 - 10.5 K/uL 6.5 5.6 8.1  Hemoglobin 13.0 - 17.0 g/dL 13.6 12.5(L) 14.5  Hematocrit 39.0 - 52.0 % 45.3 41.7 49.8  Platelets 150 - 400 K/uL 261 269 353     DG Chest Port 1 View  Result Date: 10/22/2020 CLINICAL DATA:  Acute respiratory failure. EXAM: PORTABLE CHEST 1 VIEW COMPARISON:  10/20/2020 FINDINGS: Tracheostomy tube overlies the airway. The cardiac silhouette is enlarged. There is persistent mild central pulmonary vascular congestion without overt edema. Airspace opacities in the left greater than right mid and lower lungs have slightly improved. Small pleural effusions are questioned. No pneumothorax is identified. IMPRESSION: Slight improvement of  bilateral airspace opacities which could reflect pneumonia or atelectasis. Electronically Signed   By: Jack Cox M.D.   On: 10/22/2020 09:16   DG Chest Port 1V same Day  Result Date: 10/22/2020 CLINICAL DATA:  Status post NG tube placement. EXAM: PORTABLE CHEST 1 VIEW COMPARISON:  Single-view of the chest earlier today. FINDINGS: The upper chest is off the margin of the film. NG tube is looped in the upper trachea or esophagus. Visualized left chest is completely whited out on this study. IMPRESSION: NG tube is looped in the upper esophagus or trachea. Complete whiteout of the left chest is new since the study earlier today could be due to mucous plugging and left lung collapse or rapidly developing pleural effusion and airspace disease. Electronically Signed   By: Jack Cox M.D.   On: 10/22/2020 15:16   DG Abd Portable 1V  Result Date: 10/22/2020 CLINICAL DATA:  Nasogastric tube placement EXAM: PORTABLE ABDOMEN - 1 VIEW COMPARISON:  Portable exam 1613 hours compared to 1523 hours FINDINGS: Tip of nasogastric tube projects over distal gastric antrum, consider withdrawal 5 cm. Bowel gas pattern normal. Opacification of the inferior LEFT hemithorax again seen. IMPRESSION: Tip of nasogastric tube projects over distal gastric antrum. Consider withdrawal of nasogastric tube 5 cm. Electronically Signed   By: Jack Cox M.D.   On: 10/22/2020 16:36   DG Abd Portable 1V  Result Date: 10/22/2020 CLINICAL DATA:  Nasogastric tube placement EXAM: PORTABLE ABDOMEN - 1 VIEW COMPARISON:  None. FINDINGS: Nasogastric tube extends into the gastric cardia where it loops upon itself with the tip at the junction of the mid and distal thirds of the esophagus. There is no bowel dilatation or air-fluid level to suggest bowel obstruction. No free air. There is consolidation left lower lobe. IMPRESSION: Nasogastric tube extends into the stomach where it loops upon itself with the tip at the junction of the mid and  distal thirds of the esophagus. No bowel obstruction or free air evident. Consolidation left lower lobe. These results will be called to the ordering clinician or representative by the Radiologist Assistant, and communication documented in the PACS or [employer]. Electronically Signed   By: Lowella Grip III M.D.   On: 10/22/2020 15:34   ECHOCARDIOGRAM COMPLETE  Result Date: 10/21/2020    ECHOCARDIOGRAM REPORT   Patient Name:   Jack Cox Date of Exam: 10/21/2020 Medical Rec #:  [id number]        Height:       68.0 in Accession #:    [id number]       Weight:       325.8 lb Date of Birth:  18-Jan-1966        BSA:          2.515 m Patient Age:    25 years         BP:           112/61 mmHg Patient Gender: M  HR:           51 bpm. Exam Location:  [facility] Procedure: 2D Echo Indications:    CHF-Acute Diastolic H47.42  History:        Patient has no prior history of Echocardiogram examinations.                 CHF; Risk Factors:Non-Smoker. Acute on chronic respiratory                 failure with hypoxia.  Sonographer:    Leavy Cella RDCS (AE) Referring Phys: [id number] DAVID TAT IMPRESSIONS  1. Left ventricular ejection fraction, by estimation, is 60 to 65%. The left ventricle has normal function. The left ventricle has no regional wall motion abnormalities. There is severe left ventricular hypertrophy. Left ventricular diastolic parameters  are indeterminate. There is the interventricular septum is flattened in systole and diastole, consistent with right ventricular pressure and volume overload.  2. Right ventricular systolic function is mildly reduced. The right ventricular size is mildly enlarged. Tricuspid regurgitation signal is inadequate for assessing PA pressure.  3. Right atrial size was upper normal.  4. The pericardial effusion is posterior to the left ventricle.  5. The mitral valve is grossly normal. Trivial mitral valve regurgitation.  6. The aortic valve is tricuspid.  Aortic valve regurgitation is not visualized.  7. The inferior vena cava is normal in size with greater than 50% respiratory variability, suggesting right atrial pressure of 3 mmHg. FINDINGS  Left Ventricle: Left ventricular ejection fraction, by estimation, is 60 to 65%. The left ventricle has normal function. The left ventricle has no regional wall motion abnormalities. The left ventricular internal cavity size was normal in size. There is  severe left ventricular hypertrophy. The interventricular septum is flattened in systole and diastole, consistent with right ventricular pressure and volume overload. Left ventricular diastolic parameters are indeterminate. Right Ventricle: The right ventricular size is mildly enlarged. No increase in right ventricular wall thickness. Right ventricular systolic function is mildly reduced. Tricuspid regurgitation signal is inadequate for assessing PA pressure. Left Atrium: Left atrial size was normal in size. Right Atrium: Right atrial size was upper normal. Pericardium: Trivial pericardial effusion is present. The pericardial effusion is posterior to the left ventricle. Mitral Valve: The mitral valve is grossly normal. Trivial mitral valve regurgitation. Tricuspid Valve: The tricuspid valve is grossly normal. Tricuspid valve regurgitation is mild. Aortic Valve: The aortic valve is tricuspid. There is moderate aortic valve annular calcification. Aortic valve regurgitation is not visualized. Pulmonic Valve: The pulmonic valve was grossly normal. Pulmonic valve regurgitation is trivial. Aorta: The aortic root is normal in size and structure. Venous: The inferior vena cava is normal in size with greater than 50% respiratory variability, suggesting right atrial pressure of 3 mmHg. IAS/Shunts: No atrial level shunt detected by color flow Doppler.  LEFT VENTRICLE PLAX 2D LVIDd:         4.66 cm  Diastology LVIDs:         2.03 cm  LV e' medial:    5.87 cm/s LV PW:         1.76 cm  LV  E/e' medial:  14.0 LV IVS:        1.73 cm  LV e' lateral:   8.49 cm/s LVOT diam:     1.90 cm  LV E/e' lateral: 9.7 LVOT Area:     2.84 cm  RIGHT VENTRICLE RV S prime:     19.40 cm/s TAPSE (M-mode): 1.9 cm  LEFT ATRIUM             Index       RIGHT ATRIUM           Index LA diam:        3.50 cm 1.39 cm/m  RA Area:     24.60 cm LA Vol (A2C):   43.9 ml 17.46 ml/m RA Volume:   82.30 ml  32.73 ml/m LA Vol (A4C):   43.8 ml 17.42 ml/m LA Biplane Vol: 44.4 ml 17.66 ml/m   AORTA Ao Root diam: 3.10 cm MITRAL VALVE MV Area (PHT): 2.64 cm    SHUNTS MV Decel Time: 287 msec    Systemic Diam: 1.90 cm MV E velocity: 82.30 cm/s MV A velocity: 59.10 cm/s MV E/A ratio:  1.39 Rozann Lesches MD Electronically signed by Rozann Lesches MD Signature Date/Time: 10/21/2020/4:33:43 PM    Final     Review of Systems  Unable to perform ROS: Patient nonverbal   Blood pressure (!) 155/85, pulse 80, temperature 99.9 F (37.7 C), temperature source Oral, resp. rate (!) 22, height '5\' 8"'  (1.727 m), weight (!) 137.1 kg, SpO2 91 %. Physical Exam Vitals and nursing note reviewed.  Constitutional:      General: He is not in acute distress.    Appearance: He is obese. He is ill-appearing.     Comments: On ventilator via tracheostomy, awakens to calling his name  HENT:     Head: Normocephalic and atraumatic.     Comments: Ventilator via tracheostomy Eyes:     Pupils: Pupils are equal, round, and reactive to light.  Neck:     Vascular: JVD present.  Cardiovascular:     Rate and Rhythm: Normal rate and regular rhythm.     Heart sounds: Normal heart sounds. No murmur heard. No gallop.   Pulmonary:     Effort: Pulmonary effort is normal.     Breath sounds: Normal breath sounds.     Comments: Ventilator via tracheostomy, anteriorly clear to auscultation Chest:     Chest wall: No mass or tenderness.  Abdominal:     General: Bowel sounds are normal.     Palpations: Abdomen is soft. There is no mass.  Musculoskeletal:      Cervical back: Normal range of motion and neck supple.     Right lower leg: Edema present.     Left lower leg: Edema present.     Comments: 2+ lower extremity edema, 1-2+ upper extremity edema  Skin:    General: Skin is warm and dry.     Findings: No rash.     Assessment/Plan: 1.  Acute kidney injury on chronic kidney disease stage IIIb-IV: Likely hemodynamically mediated in the setting of what appears to be decompensated diastolic heart failure with creatinine Cox following reinitiation of diuretic therapy (RAS activation).  I suspect that he probably has an element of chronic cardiorenal syndrome as one of the underlying etiologies of his chronic kidney disease.  Furosemide held yesterday after rising creatinine noted; today I will order albumin/furosemide because of his hypoalbuminemia and to try and improve drug efficacy.  Urine output appears to be fair overnight and he has significant volume overload on exam.  Home medications indicates that he was taking meloxicam-this will need to be verified and stopped prior to his discharge. Avoid nephrotoxic medications including NSAIDs and iodinated intravenous contrast exposure unless the latter is absolutely indicated.  Preferred narcotic agents for pain control are hydromorphone, fentanyl, and methadone. Morphine should  not be used. Avoid Baclofen and avoid oral sodium phosphate and magnesium citrate based laxatives / bowel preps. Continue strict Input and Output monitoring. Will monitor the patient closely with you and intervene or adjust therapy as indicated by changes in clinical status/labs.  He does not have any acute indications for dialysis at this time and any decision to undertake chronic dialysis will need to be after extensive discussions with the patient/family given his current comorbidities. 2.  Acute exacerbation of diastolic heart failure/cor pulmonale: Significant volume overload noted likely compounding his respiratory status, will  attempt to titrate diuretic therapy for volume unloading in the setting of acute kidney injury. 3.  Acute on chronic respiratory failure with hypoxia/hypercapnia: With chronic OHS/OSA and hypercapnia-recently worsened likely by CHF exacerbation.  Critical care assisting with ventilator adjustments. 4.  Secondary hyperparathyroidism: We will check phosphorus level with earlier drawn labs.  Calcium level at goal when corrected for his albumin. 5.  Hypertension: Blood pressures transiently elevated, monitor with ongoing diuresis.  Nikitia Asbill K. 10/23/2020, 12:23 PM

## 2020-10-23 NOTE — Progress Notes (Signed)
Patient's HR increased to 150 at rest, EKG performed. Patient does have fever of 101. Tylenol given and patient being cooled. MD aware of HR between 130-150. Rate was sustained for approximately 30 minutes. Rate currently in 80's, NSR on monitor. Will continue to monitor.

## 2020-10-24 LAB — COMPREHENSIVE METABOLIC PANEL
ALT: 16 U/L (ref 0–44)
AST: 29 U/L (ref 15–41)
Albumin: 2.6 g/dL — ABNORMAL LOW (ref 3.5–5.0)
Alkaline Phosphatase: 71 U/L (ref 38–126)
Anion gap: 10 (ref 5–15)
BUN: 72 mg/dL — ABNORMAL HIGH (ref 6–20)
CO2: 32 mmol/L (ref 22–32)
Calcium: 8.2 mg/dL — ABNORMAL LOW (ref 8.9–10.3)
Chloride: 100 mmol/L (ref 98–111)
Creatinine, Ser: 3.22 mg/dL — ABNORMAL HIGH (ref 0.61–1.24)
GFR, Estimated: 22 mL/min — ABNORMAL LOW (ref >60)
Glucose, Bld: 114 mg/dL — ABNORMAL HIGH (ref 70–99)
Potassium: 4 mmol/L (ref 3.5–5.1)
Sodium: 142 mmol/L (ref 135–145)
Total Bilirubin: 0.6 mg/dL (ref 0.3–1.2)
Total Protein: 6.1 g/dL — ABNORMAL LOW (ref 6.5–8.1)

## 2020-10-24 LAB — URINALYSIS, COMPLETE (UACMP) WITH MICROSCOPIC
Bacteria, UA: NONE SEEN
Bilirubin Urine: NEGATIVE
Glucose, UA: NEGATIVE mg/dL
Hgb urine dipstick: NEGATIVE
Ketones, ur: NEGATIVE mg/dL
Leukocytes,Ua: NEGATIVE
Nitrite: NEGATIVE
Protein, ur: 100 mg/dL — AB
Specific Gravity, Urine: 1.01 (ref 1.005–1.030)
pH: 5 (ref 5.0–8.0)

## 2020-10-24 LAB — CBC
HCT: 41.2 % (ref 39.0–52.0)
Hemoglobin: 12.6 g/dL — ABNORMAL LOW (ref 13.0–17.0)
MCH: 30.3 pg (ref 26.0–34.0)
MCHC: 30.6 g/dL (ref 30.0–36.0)
MCV: 99 fL (ref 80.0–100.0)
Platelets: 232 10*3/uL (ref 150–400)
RBC: 4.16 MIL/uL — ABNORMAL LOW (ref 4.22–5.81)
RDW: 14 % (ref 11.5–15.5)
WBC: 6.5 10*3/uL (ref 4.0–10.5)
nRBC: 0 % (ref 0.0–0.2)

## 2020-10-24 LAB — GLUCOSE, CAPILLARY
Glucose-Capillary: 106 mg/dL — ABNORMAL HIGH (ref 70–99)
Glucose-Capillary: 85 mg/dL (ref 70–99)
Glucose-Capillary: 101 mg/dL — ABNORMAL HIGH (ref 70–99)
Glucose-Capillary: 117 mg/dL — ABNORMAL HIGH (ref 70–99)
Glucose-Capillary: 125 mg/dL — ABNORMAL HIGH (ref 70–99)

## 2020-10-24 LAB — BRAIN NATRIURETIC PEPTIDE: B Natriuretic Peptide: 205 pg/mL — ABNORMAL HIGH (ref 0.0–100.0)

## 2020-10-24 LAB — TRIGLYCERIDES: Triglycerides: 333 mg/dL — ABNORMAL HIGH (ref <150)

## 2020-10-24 MED ORDER — ALBUMIN HUMAN 25 % IV SOLN
12.5000 g | Freq: Once | INTRAVENOUS | Status: AC
  Administered 2020-10-25: 12.5 g via INTRAVENOUS
  Filled 2020-10-24: qty 50

## 2020-10-24 MED ORDER — ENOXAPARIN SODIUM 150 MG/ML ~~LOC~~ SOLN
130.0000 mg | SUBCUTANEOUS | Status: DC
  Administered 2020-10-24: 130 mg via SUBCUTANEOUS
  Filled 2020-10-24: qty 1
  Filled 2020-10-24 (×3): qty 0.86

## 2020-10-24 MED ORDER — ALBUMIN HUMAN 25 % IV SOLN
12.5000 g | Freq: Once | INTRAVENOUS | Status: AC
  Administered 2020-10-24: 12.5 g via INTRAVENOUS
  Filled 2020-10-24: qty 50

## 2020-10-24 MED ORDER — AMIODARONE HCL IN DEXTROSE 360-4.14 MG/200ML-% IV SOLN
60.0000 mg/h | INTRAVENOUS | Status: AC
  Filled 2020-10-24: qty 200

## 2020-10-24 MED ORDER — FUROSEMIDE 10 MG/ML IJ SOLN
80.0000 mg | Freq: Once | INTRAMUSCULAR | Status: AC
  Administered 2020-10-24: 80 mg via INTRAVENOUS
  Filled 2020-10-24: qty 8

## 2020-10-24 MED ORDER — FUROSEMIDE 10 MG/ML IJ SOLN
80.0000 mg | Freq: Once | INTRAMUSCULAR | Status: AC
  Administered 2020-10-25: 80 mg via INTRAVENOUS
  Filled 2020-10-24: qty 8

## 2020-10-24 MED ORDER — FUROSEMIDE 10 MG/ML IJ SOLN: 40.0000 mg | Freq: Once | INTRAMUSCULAR | Status: DC

## 2020-10-24 MED ORDER — ALBUMIN HUMAN 25 % IV SOLN: 12.5000 g | Freq: Once | INTRAVENOUS | Status: DC

## 2020-10-24 MED ORDER — AMIODARONE HCL IN DEXTROSE 360-4.14 MG/200ML-% IV SOLN: 30.0000 mg/h | INTRAVENOUS | Status: DC

## 2020-10-24 NOTE — Progress Notes (Signed)
Patient's HR increased to 125-150 range in afib, sustained for an hour. MD made aware. Orders placed. However, HR dropped to 75-85 range in NSR while waiting for medication to be approved. Will hold medication for now and monitor patient and start treatment as appropriate.

## 2020-10-24 NOTE — Progress Notes (Signed)
PROGRESS NOTE  Jack Cox OIB:[id number] DOB: [birth year] DOA: 10/20/2020 PCP: [name], MD  Brief History:  54 y.o.malewith medical history ofdiastolic CHF, atrial flutter, obstructive sleep apnea, hypertension, right bundle branch block presenting with shortness of breath for the last 2 to 3 days. Unfortunately, the patient is encephalopathic at the time of my evaluation; therefore, history is limited. Apparently, the patient went to see his nephrologist earlier on 10/20/2020. He was noted to have oxygen saturation in the 60s. As result, he was sent to the emergency department for further evaluation. Initially, the patient was awake and conversant with oxygen saturation in the low 70s. He was placed on a nonrebreather after which the patient became obtunded. VBG showed pH 7.1 66/97/106 on 80%. Subsequently, the patient was placed on the ventilator with PRVC,PEEP of 5, FiO2 70%. After approximately 10 to 15 minutes on the ventilator, the patient remained somnolent, but awakened and was able to tell me his name. He did state that he has not been taking his torsemide as directed. In addition, the patient has been sleeping in a recliner for an unclear duration of time secondary to shortness of breath. At baseline, the patient has a size 4 noncuffed tracheostomy. He is on supplemental oxygen 2.5 L during the day and 4 L at nighttime. There is no reports of nausea, vomiting, diarrhea, chest pain, abdominal pain. In the emergency department, the patient was afebrile and hemodynamically stable with oxygen saturation 93-94% on the ventilator with FiO2 70%. Chest x-ray showed left-sided pleural effusion with increased interstitial markings. BNP 790. PCT 0.12. BMP showed a serum creatinine 2.43. LFTs were unremarkable. WBC 8.1, hemoglobin 14.5, platelets 253,000. The patient was given ceftriaxone and azithromycin. He was also given furosemide 80 mg IV x1. COVID-19 RT-PCR  negative   Assessment/Plan: Acute on chronic respiratory failure with hypoxia and hypercarbia -Secondary to CHF in the setting of OSA -remainson ventilator -personally reviewed CXR--increased interstitial markings -Vent settings PC16,PEEP [id number]% -Pulmonary consultappreciated -PCT 0.12>>0.61 -having mucus plugging--needs aggressive suctioning -12/17 CXR--white out of left lung -continue mucomyst and albuterol -12/18 repeat CXR--personally reviewed--bilateral effusions, increased interstitial markings; white out improved  Acute on chronic diastolic CHF/Cor Pulmonale -06/25/2019 echo EF 55%, trivial TR -12/16/21echo--EF 60-65%, no WMA, RV pressure overload, mild TR -restarted IV lasix with albumin -pt remains clinically fluid overloaded -Daily BMP -Accurate I's and O's -Daily weights  Atrial flutter -Continue apixaban if awake enough to tolerate p.o. 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 -Baseline creatinine 2.4-2.7 -Monitor with diuresis -will need tolerate increased serum creatinine for diuresis -consult nephrology--appreciated-->albumin+lasix bid per renal  FEN -having mild hypoglycemia -NG placed 12/17 -switched to Vital High Protein and increase rate to 40 cc/hr-->hypoglycemia improved  Fever -101.0 fever evening 12/18 -blood cultures x 2 -UA and urine culture -12/16 trach aspirate-->flora  Essential hypertension -Anticipate improvement with diuresis -restart metoprolol, but give IV for now  OSA -The patient has a tracheostomy placed in 2001 -He has a size 4 cuffless trach  Right bundle branch block -This has been chronic with review of the medical records  Morbid obesity -BMI 52.94 -lifestyle modification      Status is: Inpatient  Remains inpatient appropriate because:IV treatments appropriate due to intensity of illness or inability to take  PO   Dispo: The patient is from:Home Anticipated d/c is XI:[facility]U Anticipated d/c date is: 3 days Patient currently  is not medically stable to d/c.        Family Communication:brotherupdated bedside 12/17  Consultants:pulm  Code Status: FULL  DVT Prophylaxis: SCLovenox--treatment dose   Procedures: As Listed in Progress Note Above  Antibiotics: None   The patient is critically ill with multiple organ systems failure and requires high complexity decision making for assessment and support, frequent evaluation and titration of therapies, application of advanced monitoring technologies and extensive interpretation of multiple databases.  Critical care time - 35 mins.        Subjective: Patient breathing better.  Denies cp, sob, abd pain.  Otherwise sedated on vent.  No diarrhea  Objective: Vitals:   10/24/20 0900 10/24/20 1000 10/24/20 1100 10/24/20 1130  BP: 135/63 132/65 (!) 121/57   Pulse: 82 80 80 81  Resp: '19 17 20 17  ' Temp:    99.3 F (37.4 C)  TempSrc:    Oral  SpO2: 96% 96% 98% 99%  Weight:      Height:        Intake/Output Summary (Last 24 hours) at 10/24/2020 1231 Last data filed at 10/24/2020 1050 Gross per 24 hour  Intake 1015.31 ml  Output 4400 ml  Net -3384.69 ml   Weight change: -4.6 kg Exam:   General:  Pt is alert, follows commands appropriately, not in acute distress  HEENT: No icterus, No thrush, No neck mass, [place]/AT  Cardiovascular: RRR, S1/S2, no rubs, no gallops  Respiratory: bibasilar rales. No wheeze  Abdomen: Soft/+BS, non tender, non distended, no guarding  Extremities: 2+LE edema, No lymphangitis, No petechiae, No rashes, no synovitis   Data Reviewed: I have personally reviewed following labs and imaging studies Basic Metabolic Panel: Recent Labs  Lab 10/20/20 1217 10/20/20 1224 10/21/20 0403 10/22/20 0512 10/23/20 0358 10/24/20 0402  NA 139   --  139 142 141 142  K 4.5  --  4.2 4.1 3.8 4.0  CL 100  --  99 100 99 100  CO2 33*  --  32 32 30 32  GLUCOSE 97  --  88 62* 74 114*  BUN 61*  --  66* 74* 75* 72*  CREATININE 2.43* 2.40* 2.76* 3.39* 3.46* 3.22*  CALCIUM 9.2  --  8.8* 8.5* 8.6* 8.2*  MG  --   --   --   --  2.0  --   PHOS  --   --   --   --  5.1*  --    Liver Function Tests: Recent Labs  Lab 10/20/20 1217 10/22/20 0512 10/24/20 0402  AST 33 22 29  ALT '30 20 16  ' ALKPHOS 114 84 71  BILITOT 0.4 1.0 0.6  PROT 7.9 6.2* 6.1*  ALBUMIN 3.2* 2.3* 2.6*   No results for input(s): LIPASE, AMYLASE in the last 168 hours. No results for input(s): AMMONIA in the last 168 hours. Coagulation Profile: Recent Labs  Lab 10/20/20 1259  INR 1.5*   CBC: Recent Labs  Lab 10/20/20 1217 10/22/20 0512 10/23/20 0358 10/24/20 0540  WBC 8.1 5.6 6.5 6.5  NEUTROABS 6.2  --   --   --   HGB 14.5 12.5* 13.6 12.6*  HCT 49.8 41.7 45.3 41.2  MCV 101.8* 99.8 99.6 99.0  PLT 353 269 261 232   Cardiac Enzymes: No results for input(s): CKTOTAL, CKMB, CKMBINDEX, TROPONINI in the last 168 hours. BNP: Invalid input(s): POCBNP CBG: Recent Labs  Lab 10/23/20 2015 10/23/20 2349 10/24/20 0433 10/24/20 0712 10/24/20 1129  GLUCAP 116* 103* 106* 85  101*   HbA1C: No results for input(s): HGBA1C in the last 72 hours. Urine analysis:    Component Value Date/Time   COLORURINE YELLOW 10/20/2020 1542   APPEARANCEUR HAZY (A) 10/20/2020 1542   LABSPEC 1.011 10/20/2020 1542   PHURINE 5.0 10/20/2020 1542   GLUCOSEU 50 (A) 10/20/2020 1542   HGBUR NEGATIVE 10/20/2020 1542   [place] 10/20/2020 1542   KETONESUR NEGATIVE 10/20/2020 1542   PROTEINUR >=300 (A) 10/20/2020 1542   NITRITE NEGATIVE 10/20/2020 1542   LEUKOCYTESUR NEGATIVE 10/20/2020 1542   Sepsis Labs: '@LABRCNTIP' (procalcitonin:4,lacticidven:4) ) Recent Results (from the past 240 hour(s))  Resp Panel by RT-PCR (Flu A&B, Covid) Nasopharyngeal Swab     Status: None    Collection Time: 10/20/20 11:43 AM   Specimen: Nasopharyngeal Swab; Nasopharyngeal(NP) swabs in vial transport medium  Result Value Ref Range Status   SARS Coronavirus 2 by RT PCR NEGATIVE NEGATIVE Final    Comment: (NOTE) SARS-CoV-2 target nucleic acids are NOT DETECTED.  The SARS-CoV-2 RNA is generally detectable in upper respiratory specimens during the acute phase of infection. The lowest concentration of SARS-CoV-2 viral copies this assay can detect is 138 copies/mL. A negative result does not preclude SARS-Cov-2 infection and should not be used as the sole basis for treatment or other patient management decisions. A negative result may occur with  improper specimen collection/handling, submission of specimen other than nasopharyngeal swab, presence of viral mutation(s) within the areas targeted by this assay, and inadequate number of viral copies(<138 copies/mL). A negative result must be combined with clinical observations, patient history, and epidemiological information. The expected result is Negative.  Fact Sheet for Patients:  EntrepreneurPulse.com.au  Fact Sheet for Healthcare Providers:  IncredibleEmployment.be  This test is no t yet approved or cleared by the [place] FDA and  has been authorized for detection and/or diagnosis of SARS-CoV-2 by FDA under an Emergency Use Authorization (EUA). This EUA will remain  in effect (meaning this test can be used) for the duration of the COVID-19 declaration under Section 564(b)(1) of the Act, 21 U.S.C.section 360bbb-3(b)(1), unless the authorization is terminated  or revoked sooner.       Influenza A by PCR NEGATIVE NEGATIVE Final   Influenza B by PCR NEGATIVE NEGATIVE Final    Comment: (NOTE) The Xpert Xpress SARS-CoV-2/FLU/RSV plus assay is intended as an aid in the diagnosis of influenza from Nasopharyngeal swab specimens and should not be used as a sole basis for treatment.  Nasal washings and aspirates are unacceptable for Xpert Xpress SARS-CoV-2/FLU/RSV testing.  Fact Sheet for Patients: EntrepreneurPulse.com.au  Fact Sheet for Healthcare Providers: IncredibleEmployment.be  This test is not yet approved or cleared by the [place] FDA and has been authorized for detection and/or diagnosis of SARS-CoV-2 by FDA under an Emergency Use Authorization (EUA). This EUA will remain in effect (meaning this test can be used) for the duration of the COVID-19 declaration under Section 564(b)(1) of the Act, 21 U.S.C. section 360bbb-3(b)(1), unless the authorization is terminated or revoked.  Performed at [facility], [address] [place], [place] [address]   Blood culture (routine x 2)     Status: None (Preliminary result)   Collection Time: 10/20/20 12:18 PM   Specimen: BLOOD  Result Value Ref Range Status   Specimen Description BLOOD  Final   Special Requests NONE  Final   Culture   Final    NO GROWTH 3 DAYS Performed at [facility], [address] [place], [place] [address]  Report Status PENDING  Incomplete  Blood culture (routine x 2)     Status: None (Preliminary result)   Collection Time: 10/20/20 12:59 PM   Specimen: BLOOD RIGHT ARM  Result Value Ref Range Status   Specimen Description BLOOD RIGHT ARM  Final   Special Requests   Final    BOTTLES DRAWN AEROBIC AND ANAEROBIC Blood Culture adequate volume   Culture   Final    NO GROWTH 3 DAYS Performed at [facility], [address], [place], [place] [address]    Report Status PENDING  Incomplete  Urine culture     Status: None   Collection Time: 10/20/20  3:42 PM   Specimen: Urine, Catheterized  Result Value Ref Range Status   Specimen Description   Final    URINE, CATHETERIZED Performed at [facility], [address] [place], [place] [address]    Special Requests   Final    NONE Performed at [facility], [address], [place], [place]  [address]    Culture   Final    NO GROWTH Performed at [facility], [place] [address] [place], [place] [address]    Report Status 10/21/2020 FINAL  Final  MRSA PCR Screening     Status: None   Collection Time: 10/20/20  6:42 PM   Specimen: Nasal Mucosa; Nasopharyngeal  Result Value Ref Range Status   MRSA by PCR NEGATIVE NEGATIVE Final    Comment:        The GeneXpert MRSA Assay (FDA approved for NASAL specimens only), is one component of a comprehensive MRSA colonization surveillance program. It is not intended to diagnose MRSA infection nor to guide or monitor treatment for MRSA infections. Performed at [facility], [address], [place], [place] [address]   Culture, respiratory (non-expectorated)     Status: None   Collection Time: 10/21/20  8:20 AM   Specimen: Tracheal Aspirate; Respiratory  Result Value Ref Range Status   Specimen Description   Final    TRACHEAL ASPIRATE Performed at [facility], [address] [place], [place] [address]    Special Requests   Final    NONE Performed at [facility], [address], [place], [place] [address]    Gram Stain   Final    FEW WBC PRESENT, PREDOMINANTLY MONONUCLEAR NO ORGANISMS SEEN    Culture   Final    RARE Normal respiratory flora-no Staph aureus or Pseudomonas seen Performed at [place] [address] [place], [place] [address]    Report Status 10/23/2020 FINAL  Final     Scheduled Meds: . acetylcysteine  1 mL Nebulization QID  . albuterol  2.5 mg Nebulization QID  . chlorhexidine gluconate (MEDLINE KIT)  15 mL Mouth Rinse BID  . Chlorhexidine Gluconate Cloth  6 each Topical Daily  . enoxaparin (LOVENOX) injection  130 mg Subcutaneous Q24H  . furosemide  80 mg Intravenous Once  . [START ON 10/25/2020] furosemide  80 mg Intravenous Once  . furosemide  80 mg Intravenous Once  . mouth rinse  15 mL Mouth Rinse 10 times per day  . pantoprazole (PROTONIX) IV  40 mg Intravenous Q12H  . sodium  chloride flush  3 mL Intravenous Q12H   Continuous Infusions: . sodium chloride    . sodium chloride    . albumin human    . [START ON 10/25/2020] albumin human    . albumin human    . amiodarone    . feeding supplement (  VITAL HIGH PROTEIN) 1,000 mL (10/23/20 1548)  . propofol (DIPRIVAN) infusion 30 mcg/kg/min (10/24/20 1116)    Procedures/Studies: DG CHEST PORT 1 VIEW  Result Date: 10/23/2020 CLINICAL DATA:  Shortness of breath. EXAM: PORTABLE CHEST 1 VIEW COMPARISON:  October 22, 2020 FINDINGS: The near complete opacification of the left hemithorax seen on the comparison chest x-ray has resolved. Persistent bibasilar opacities are likely atelectasis. No pneumothorax. A tracheostomy tube is in good position. An NG tube terminates in the stomach. No other acute abnormalities. IMPRESSION: 1. Support apparatus as above. 2. Bibasilar atelectasis and a possible small left effusion. Improved aeration of the left lung. Electronically Signed   By: Dorise Bullion III M.D   On: 10/23/2020 14:57   DG Chest Port 1 View  Result Date: 10/22/2020 CLINICAL DATA:  Acute respiratory failure. EXAM: PORTABLE CHEST 1 VIEW COMPARISON:  10/20/2020 FINDINGS: Tracheostomy tube overlies the airway. The cardiac silhouette is enlarged. There is persistent mild central pulmonary vascular congestion without overt edema. Airspace opacities in the left greater than right mid and lower lungs have slightly improved. Small pleural effusions are questioned. No pneumothorax is identified. IMPRESSION: Slight improvement of bilateral airspace opacities which could reflect pneumonia or atelectasis. Electronically Signed   By: [name] M.D.   On: 10/22/2020 09:16   DG Chest Port 1 View  Result Date: 10/20/2020 CLINICAL DATA:  Questionable sepsis EXAM: PORTABLE CHEST 1 VIEW COMPARISON:  Chest x-ray 06/12/2018 report without images. FINDINGS: Tracheostomy tube terminates approximately 7.5 cm above the carina. The heart size  and mediastinal contours are not well visualized. Bilateral hilar vasculature prominence. Aortic arch calcifications. Silhouetting off of the left hemidiaphragm and left cardiac border. Partial silhouetting off the right cardiac border and right hemidiaphragm. Increased interstitial markings. Likely at least trace to small volume left pleural effusion. No pneumothorax. No acute osseous abnormality. IMPRESSION: 1. Left lung opacity and likely at least trace to small volume left pleural effusion with silhouetting off of the left diaphragm and heart border. Concern for infection/inflammation. 2. Right lower lobe and middle lobe airspace opacity could represent a combination of atelectasis, infection, inflammation. 3. At least mild pulmonary edema. 4. Consider chest x-ray PA and lateral view for further evaluation. Electronically Signed   By: Iven Finn M.D.   On: 10/20/2020 12:26   DG Chest Port 1V same Day  Result Date: 10/22/2020 CLINICAL DATA:  Status post NG tube placement. EXAM: PORTABLE CHEST 1 VIEW COMPARISON:  Single-view of the chest earlier today. FINDINGS: The upper chest is off the margin of the film. NG tube is looped in the upper trachea or esophagus. Visualized left chest is completely whited out on this study. IMPRESSION: NG tube is looped in the upper esophagus or trachea. Complete whiteout of the left chest is new since the study earlier today could be due to mucous plugging and left lung collapse or rapidly developing pleural effusion and airspace disease. Electronically Signed   By: [name] M.D.   On: 10/22/2020 15:16   DG Abd Portable 1V  Result Date: 10/22/2020 CLINICAL DATA:  Nasogastric tube placement EXAM: PORTABLE ABDOMEN - 1 VIEW COMPARISON:  Portable exam 1613 hours compared to 1523 hours FINDINGS: Tip of nasogastric tube projects over distal gastric antrum, consider withdrawal 5 cm. Bowel gas pattern normal. Opacification of the inferior LEFT hemithorax again seen.  IMPRESSION: Tip of nasogastric tube projects over distal gastric antrum. Consider withdrawal of nasogastric tube 5 cm. Electronically Signed   By: Crist Infante.D.  On: 10/22/2020 16:36   DG Abd Portable 1V  Result Date: 10/22/2020 CLINICAL DATA:  Nasogastric tube placement EXAM: PORTABLE ABDOMEN - 1 VIEW COMPARISON:  None. FINDINGS: Nasogastric tube extends into the gastric cardia where it loops upon itself with the tip at the junction of the mid and distal thirds of the esophagus. There is no bowel dilatation or air-fluid level to suggest bowel obstruction. No free air. There is consolidation left lower lobe. IMPRESSION: Nasogastric tube extends into the stomach where it loops upon itself with the tip at the junction of the mid and distal thirds of the esophagus. No bowel obstruction or free air evident. Consolidation left lower lobe. These results will be called to the ordering clinician or representative by the Radiologist Assistant, and communication documented in the PACS or [employer]. Electronically Signed   By: Lowella Grip III M.D.   On: 10/22/2020 15:34   ECHOCARDIOGRAM COMPLETE  Result Date: 10/21/2020    ECHOCARDIOGRAM REPORT   Patient Name:   Jack Cox Date of Exam: 10/21/2020 Medical Rec #:  [id number]        Height:       68.0 in Accession #:    [id number]       Weight:       325.8 lb Date of Birth:  November 12, 1965        BSA:          2.515 m Patient Age:    55 years         BP:           112/61 mmHg Patient Gender: M                HR:           51 bpm. Exam Location:  [facility] Procedure: 2D Echo Indications:    CHF-Acute Diastolic X10.62  History:        Patient has no prior history of Echocardiogram examinations.                 CHF; Risk Factors:Non-Smoker. Acute on chronic respiratory                 failure with hypoxia.  Sonographer:    Leavy Cella RDCS (AE) Referring Phys: [phone] Maguire Killmer IMPRESSIONS  1. Left ventricular ejection fraction, by estimation, is 60 to  65%. The left ventricle has normal function. The left ventricle has no regional wall motion abnormalities. There is severe left ventricular hypertrophy. Left ventricular diastolic parameters  are indeterminate. There is the interventricular septum is flattened in systole and diastole, consistent with right ventricular pressure and volume overload.  2. Right ventricular systolic function is mildly reduced. The right ventricular size is mildly enlarged. Tricuspid regurgitation signal is inadequate for assessing PA pressure.  3. Right atrial size was upper normal.  4. The pericardial effusion is posterior to the left ventricle.  5. The mitral valve is grossly normal. Trivial mitral valve regurgitation.  6. The aortic valve is tricuspid. Aortic valve regurgitation is not visualized.  7. The inferior vena cava is normal in size with greater than 50% respiratory variability, suggesting right atrial pressure of 3 mmHg. FINDINGS  Left Ventricle: Left ventricular ejection fraction, by estimation, is 60 to 65%. The left ventricle has normal function. The left ventricle has no regional wall motion abnormalities. The left ventricular internal cavity size was normal in size. There is  severe left ventricular hypertrophy. The interventricular septum is flattened in systole and diastole,  consistent with right ventricular pressure and volume overload. Left ventricular diastolic parameters are indeterminate. Right Ventricle: The right ventricular size is mildly enlarged. No increase in right ventricular wall thickness. Right ventricular systolic function is mildly reduced. Tricuspid regurgitation signal is inadequate for assessing PA pressure. Left Atrium: Left atrial size was normal in size. Right Atrium: Right atrial size was upper normal. Pericardium: Trivial pericardial effusion is present. The pericardial effusion is posterior to the left ventricle. Mitral Valve: The mitral valve is grossly normal. Trivial mitral valve  regurgitation. Tricuspid Valve: The tricuspid valve is grossly normal. Tricuspid valve regurgitation is mild. Aortic Valve: The aortic valve is tricuspid. There is moderate aortic valve annular calcification. Aortic valve regurgitation is not visualized. Pulmonic Valve: The pulmonic valve was grossly normal. Pulmonic valve regurgitation is trivial. Aorta: The aortic root is normal in size and structure. Venous: The inferior vena cava is normal in size with greater than 50% respiratory variability, suggesting right atrial pressure of 3 mmHg. IAS/Shunts: No atrial level shunt detected by color flow Doppler.  LEFT VENTRICLE PLAX 2D LVIDd:         4.66 cm  Diastology LVIDs:         2.03 cm  LV e' medial:    5.87 cm/s LV PW:         1.76 cm  LV E/e' medial:  14.0 LV IVS:        1.73 cm  LV e' lateral:   8.49 cm/s LVOT diam:     1.90 cm  LV E/e' lateral: 9.7 LVOT Area:     2.84 cm  RIGHT VENTRICLE RV S prime:     19.40 cm/s TAPSE (M-mode): 1.9 cm LEFT ATRIUM             Index       RIGHT ATRIUM           Index LA diam:        3.50 cm 1.39 cm/m  RA Area:     24.60 cm LA Vol (A2C):   43.9 ml 17.46 ml/m RA Volume:   82.30 ml  32.73 ml/m LA Vol (A4C):   43.8 ml 17.42 ml/m LA Biplane Vol: 44.4 ml 17.66 ml/m   AORTA Ao Root diam: 3.10 cm MITRAL VALVE MV Area (PHT): 2.64 cm    SHUNTS MV Decel Time: 287 msec    Systemic Diam: 1.90 cm MV E velocity: 82.30 cm/s MV A velocity: 59.10 cm/s MV E/A ratio:  1.39 Rozann Lesches MD Electronically signed by Rozann Lesches MD Signature Date/Time: 10/21/2020/4:33:43 PM    Final     [name], DO  [facility]  If 7PM-7AM, please contact night-coverage [email or web address] Password [email or web address] 10/24/2020, 12:31 PM   LOS: 4 days

## 2020-10-24 NOTE — Progress Notes (Addendum)
1:23AM RN called due to recurrence of patient's HR ranging 120-145 and in paroxysmal A. fib since last hour.  Fever already resolved at this time.  BP was soft at 101/66.  Amiodarone drip (without the bolus) was started.  EKG will be obtained.  Continue telemetry

## 2020-10-25 DIAGNOSIS — J9811 Atelectasis: Secondary | ICD-10-CM

## 2020-10-25 LAB — COMPREHENSIVE METABOLIC PANEL
ALT: 16 U/L (ref 0–44)
AST: 27 U/L (ref 15–41)
Albumin: 3 g/dL — ABNORMAL LOW (ref 3.5–5.0)
Alkaline Phosphatase: 75 U/L (ref 38–126)
Anion gap: 10 (ref 5–15)
BUN: 69 mg/dL — ABNORMAL HIGH (ref 6–20)
CO2: 37 mmol/L — ABNORMAL HIGH (ref 22–32)
Calcium: 8.8 mg/dL — ABNORMAL LOW (ref 8.9–10.3)
Chloride: 98 mmol/L (ref 98–111)
Creatinine, Ser: 2.84 mg/dL — ABNORMAL HIGH (ref 0.61–1.24)
GFR, Estimated: 26 mL/min — ABNORMAL LOW (ref >60)
Glucose, Bld: 120 mg/dL — ABNORMAL HIGH (ref 70–99)
Potassium: 3.9 mmol/L (ref 3.5–5.1)
Sodium: 145 mmol/L (ref 135–145)
Total Bilirubin: 0.8 mg/dL (ref 0.3–1.2)
Total Protein: 6.9 g/dL (ref 6.5–8.1)

## 2020-10-25 LAB — PHOSPHORUS: Phosphorus: 4.6 mg/dL (ref 2.5–4.6)

## 2020-10-25 LAB — GLUCOSE, CAPILLARY
Glucose-Capillary: 105 mg/dL — ABNORMAL HIGH (ref 70–99)
Glucose-Capillary: 110 mg/dL — ABNORMAL HIGH (ref 70–99)
Glucose-Capillary: 112 mg/dL — ABNORMAL HIGH (ref 70–99)
Glucose-Capillary: 118 mg/dL — ABNORMAL HIGH (ref 70–99)
Glucose-Capillary: 139 mg/dL — ABNORMAL HIGH (ref 70–99)
Glucose-Capillary: 88 mg/dL (ref 70–99)
Glucose-Capillary: 98 mg/dL (ref 70–99)

## 2020-10-25 LAB — URINE CULTURE: Culture: NO GROWTH

## 2020-10-25 LAB — CULTURE, BLOOD (ROUTINE X 2)
Culture: NO GROWTH
Culture: NO GROWTH
Special Requests: ADEQUATE

## 2020-10-25 LAB — CBC
HCT: 42.1 % (ref 39.0–52.0)
Hemoglobin: 12.6 g/dL — ABNORMAL LOW (ref 13.0–17.0)
MCH: 30 pg (ref 26.0–34.0)
MCHC: 29.9 g/dL — ABNORMAL LOW (ref 30.0–36.0)
MCV: 100.2 fL — ABNORMAL HIGH (ref 80.0–100.0)
Platelets: 196 10*3/uL (ref 150–400)
RBC: 4.2 MIL/uL — ABNORMAL LOW (ref 4.22–5.81)
RDW: 14.1 % (ref 11.5–15.5)
WBC: 5.8 10*3/uL (ref 4.0–10.5)
nRBC: 0 % (ref 0.0–0.2)

## 2020-10-25 LAB — TRIGLYCERIDES: Triglycerides: 345 mg/dL — ABNORMAL HIGH (ref <150)

## 2020-10-25 MED ORDER — METOPROLOL TARTRATE 5 MG/5ML IV SOLN
2.5000 mg | Freq: Three times a day (TID) | INTRAVENOUS | Status: DC
  Administered 2020-10-25 – 2020-10-27 (×7): 2.5 mg via INTRAVENOUS
  Filled 2020-10-25 (×7): qty 5

## 2020-10-25 MED ORDER — DEXMEDETOMIDINE HCL IN NACL 400 MCG/100ML IV SOLN
0.4000 ug/kg/h | INTRAVENOUS | Status: DC
  Administered 2020-10-25 (×2): 0.9 ug/kg/h via INTRAVENOUS
  Administered 2020-10-25: 0.4 ug/kg/h via INTRAVENOUS
  Administered 2020-10-26 (×2): 0.8 ug/kg/h via INTRAVENOUS
  Administered 2020-10-26: 0.7 ug/kg/h via INTRAVENOUS
  Administered 2020-10-26: 0.9 ug/kg/h via INTRAVENOUS
  Administered 2020-10-26: 1.2 ug/kg/h via INTRAVENOUS
  Administered 2020-10-26 – 2020-10-27 (×9): 0.9 ug/kg/h via INTRAVENOUS
  Administered 2020-10-28: 0.8 ug/kg/h via INTRAVENOUS
  Administered 2020-10-28: 0.9 ug/kg/h via INTRAVENOUS
  Administered 2020-10-28 – 2020-10-29 (×5): 0.8 ug/kg/h via INTRAVENOUS
  Administered 2020-10-30: 1.2 ug/kg/h via INTRAVENOUS
  Administered 2020-10-30 (×3): 0.8 ug/kg/h via INTRAVENOUS
  Administered 2020-10-30: 1.2 ug/kg/h via INTRAVENOUS
  Administered 2020-10-30: 0.8 ug/kg/h via INTRAVENOUS
  Administered 2020-10-30: 0.9 ug/kg/h via INTRAVENOUS
  Administered 2020-10-30 (×2): 1.2 ug/kg/h via INTRAVENOUS
  Administered 2020-10-31 (×2): 1 ug/kg/h via INTRAVENOUS
  Administered 2020-10-31 (×2): 0.8 ug/kg/h via INTRAVENOUS
  Administered 2020-10-31: 1 ug/kg/h via INTRAVENOUS
  Administered 2020-10-31: 0.9 ug/kg/h via INTRAVENOUS
  Administered 2020-11-01: 0.8 ug/kg/h via INTRAVENOUS
  Administered 2020-11-01: 1 ug/kg/h via INTRAVENOUS
  Administered 2020-11-01: 0.9 ug/kg/h via INTRAVENOUS
  Administered 2020-11-01: 0.8 ug/kg/h via INTRAVENOUS
  Administered 2020-11-01: 0.9 ug/kg/h via INTRAVENOUS
  Administered 2020-11-01: 0.8 ug/kg/h via INTRAVENOUS
  Administered 2020-11-02 (×2): 1 ug/kg/h via INTRAVENOUS
  Administered 2020-11-02: 1.2 ug/kg/h via INTRAVENOUS
  Administered 2020-11-02 – 2020-11-03 (×7): 1 ug/kg/h via INTRAVENOUS
  Administered 2020-11-03 (×2): 1.2 ug/kg/h via INTRAVENOUS
  Administered 2020-11-03: 1 ug/kg/h via INTRAVENOUS
  Administered 2020-11-04 (×9): 1.2 ug/kg/h via INTRAVENOUS
  Administered 2020-11-05: 1 ug/kg/h via INTRAVENOUS
  Administered 2020-11-05 (×3): 1.2 ug/kg/h via INTRAVENOUS
  Administered 2020-11-06: 1 ug/kg/h via INTRAVENOUS
  Administered 2020-11-07: 0.8 ug/kg/h via INTRAVENOUS
  Administered 2020-11-07: 1 ug/kg/h via INTRAVENOUS
  Administered 2020-11-07 (×2): 0.8 ug/kg/h via INTRAVENOUS
  Administered 2020-11-08 (×5): 1 ug/kg/h via INTRAVENOUS
  Administered 2020-11-09: 0.8 ug/kg/h via INTRAVENOUS
  Administered 2020-11-09 (×2): 1 ug/kg/h via INTRAVENOUS
  Administered 2020-11-09: 0.4 ug/kg/h via INTRAVENOUS
  Administered 2020-11-09: 0.8 ug/kg/h via INTRAVENOUS
  Administered 2020-11-09: 1 ug/kg/h via INTRAVENOUS
  Administered 2020-11-10: 0.4 ug/kg/h via INTRAVENOUS
  Administered 2020-11-10: 0.7 ug/kg/h via INTRAVENOUS
  Administered 2020-11-10 (×2): 0.4 ug/kg/h via INTRAVENOUS
  Administered 2020-11-11: 0.5 ug/kg/h via INTRAVENOUS
  Administered 2020-11-11: 0.8 ug/kg/h via INTRAVENOUS
  Administered 2020-11-11: 0.399 ug/kg/h via INTRAVENOUS
  Administered 2020-11-12 (×2): 0.9 ug/kg/h via INTRAVENOUS
  Filled 2020-10-25 (×6): qty 100
  Filled 2020-10-25: qty 300
  Filled 2020-10-25 (×12): qty 100
  Filled 2020-10-25: qty 200
  Filled 2020-10-25 (×5): qty 100
  Filled 2020-10-25: qty 200
  Filled 2020-10-25 (×7): qty 100
  Filled 2020-10-25: qty 200
  Filled 2020-10-25 (×2): qty 100
  Filled 2020-10-25: qty 200
  Filled 2020-10-25: qty 500
  Filled 2020-10-25 (×3): qty 100
  Filled 2020-10-25: qty 500
  Filled 2020-10-25 (×12): qty 100
  Filled 2020-10-25: qty 500
  Filled 2020-10-25 (×9): qty 100
  Filled 2020-10-25: qty 200
  Filled 2020-10-25 (×7): qty 100
  Filled 2020-10-25: qty 500
  Filled 2020-10-25 (×2): qty 100
  Filled 2020-10-25: qty 200
  Filled 2020-10-25 (×21): qty 100

## 2020-10-25 MED ORDER — PANTOPRAZOLE SODIUM 40 MG PO PACK
40.0000 mg | PACK | Freq: Every day | ORAL | Status: DC
  Administered 2020-10-26 – 2020-11-12 (×17): 40 mg
  Filled 2020-10-25 (×19): qty 20

## 2020-10-25 MED ORDER — LABETALOL HCL 5 MG/ML IV SOLN
10.0000 mg | Freq: Once | INTRAVENOUS | Status: AC
  Administered 2020-10-25: 10 mg via INTRAVENOUS
  Filled 2020-10-25: qty 4

## 2020-10-25 NOTE — Progress Notes (Signed)
NAME:  Jack Cox, MRN:  [id number], DOB:  1966-04-17, LOS: 5 ADMISSION DATE:  10/20/2020, CONSULTATION DATE:  10/20/20  REFERRING MD:  Tat, Triad  CHIEF COMPLAINT:  Resp distress   Brief History   54 yo never smoker with MO complicated by severe OSA req trach 2001 last eval by Aloha Bartok 2018 also followed by ENT admit with increasing sob x 48 h pta s fever /purulent sputum and found to have severe hypercabic hypoxemic resp failure on arrival to ER am 12/15 so placed on vent and PCCM service requested to eval.   Wife reported pt not able to lie back on regular settings for 02/cpap x > 1 one week PTA assoc with acute on chronic leg swelling  History of present illness   54 y.o. male with medical history of diastolic CHF, atrial flutter, obstructive sleep apnea, hypertension, right bundle branch block presenting with shortness of breath for the last 2 to 3 days.    Apparently, the patient went to see his nephrologist earlier on 10/20/2020.  He was noted to have oxygen saturation in the 60s.  As result, he was sent to the emergency department for further evaluation.  Initially, the patient was awake and conversant with oxygen saturation in the low 70s.  He was placed on a nonrebreather after which the patient became obtunded.  VBG showed pH 7.1 66/97/106 on 80%.  Subsequently, the patient was placed on the ventilator with PRVC, PEEP of 5, FiO2 70%.  After approximately 10 to 15 minutes on the ventilator, the patient remained somnolent, but awakened and was able to say his name.  He did state that he has not been taking his torsemide as directed.  In addition, the patient has been sleeping in a recliner for an unclear duration of time secondary to shortness of breath.  At baseline, the patient has a size 4 noncuffed tracheostomy.  He is on supplemental oxygen 2.5 L during the day and 4 L at nighttime. There is no reports of nausea, vomiting, diarrhea, chest pain, abdominal pain. In the emergency  department, the patient was afebrile and hemodynamically stable with oxygen saturation 93-94% on the ventilator with FiO2 70%.  Chest x-ray showed left-sided pleural effusion with increased interstitial markings.  BNP 790.  PCT 0.12.  BMP showed a serum creatinine 2.43.  LFTs were unremarkable.  WBC 8.1, hemoglobin 14.5, platelets 253,000.  The patient was given ceftriaxone and azithromycin.  He was also given furosemide 80 mg IV x1.  COVID-19 RT-PCR negative   Past Medical History  CKD Proteinuria, Congestive heart failure   Essential hypertension  Gout Hypercholesteremia   Tracheostomy dependence    Significant Hospital Events    changed to PCV pm 12/17   Consults:  PCCM pm 12/15  Procedures:  New cuffed trach 12/15   Significant Diagnostic Tests:   echo 12/16 ok LV There is the interventricular septum is flattened in  systole and diastole, consistent with right ventricular pressure and  volume overload.  2. Right ventricular systolic function is mildly reduced. The right  ventricular size is mildly enlarged. Tricuspid regurgitation signal is  inadequate for assessing PA pressure.  3. Right atrial size was upper normal/ RAP 50m   Chest x-ray 12/17 left whiteout, resolved on chest x-ray 12/18  Micro Data:  Resp viral panel  12/15 neg MRSA  PCR  12/15 neg  BC x 2 12/15 >>> Urine 12/15 >>>  Neg  resp12/16 > ng Urine legionella 12/16 >>>neg Urine strep 12/16 >  Neg    Antimicrobials:  zmax  12/15   Rocephin 12/15     Scheduled Meds: . acetylcysteine  1 mL Nebulization QID  . albuterol  2.5 mg Nebulization QID  . chlorhexidine gluconate (MEDLINE KIT)  15 mL Mouth Rinse BID  . Chlorhexidine Gluconate Cloth  6 each Topical Daily  . enoxaparin (LOVENOX) injection  130 mg Subcutaneous Q24H  . mouth rinse  15 mL Mouth Rinse 10 times per day  . metoprolol tartrate  2.5 mg Intravenous Q8H  . pantoprazole (PROTONIX) IV  40 mg Intravenous Q12H  . sodium chloride flush  3  mL Intravenous Q12H   Continuous Infusions: . sodium chloride    . sodium chloride    . amiodarone    . feeding supplement (VITAL HIGH PROTEIN) 1,000 mL (10/24/20 1548)  . propofol (DIPRIVAN) infusion 40 mcg/kg/min (10/25/20 0809)   PRN Meds:.   Interim history/subjective:   Transition to pressure control ventilation over the weekend Good urine output with diuretics. Afebrile   Objective   Blood pressure (!) 150/80, pulse (!) 104, temperature 98.8 F (37.1 C), temperature source Oral, resp. rate (!) 27, height '5\' 8"'  (1.727 m), weight 133.4 kg, SpO2 91 %.    Vent Mode: PCV FiO2 (%):  [50 %-60 %] 50 % Set Rate:  [20 bmp] 20 bmp Vt Set:  [16 mL] 16 mL PEEP:  [8 cmH20-10 cmH20] 8 cmH20 Plateau Pressure:  [19 cmH20-22 cmH20] 20 cmH20   Intake/Output Summary (Last 24 hours) at 10/25/2020 1251 Last data filed at 10/25/2020 1141 Gross per 24 hour  Intake 1855.19 ml  Output 5275 ml  Net -3419.81 ml   Filed Weights   10/23/20 0500 10/24/20 0500 10/25/20 0500  Weight: (!) 137.1 kg 132.5 kg 133.4 kg    Examination: Gen:      Middle-aged obese man, no distress  HEENT:  EOMI, sclera anicteric, mild pallor Neck:     No JVD; no thyromegaly Lungs:    Decreased breath sounds bilateral CV:         Regular rate and rhythm; no murmurs Abd:      + bowel sounds; soft, non-tender; no palpable masses, no distension Ext:    1+ edema; adequate peripheral perfusion Skin:      Warm and dry; no rash Neuro: Follows one-step commands, nonfocal   Chest x-ray 12/18 independently reviewed, improved aeration on left compared to 12/17 Bibasilar opacities persist?  Atelectasis Labs show normal electrolytes, decreasing creatinine, slight high triglycerides, no leukocytosis  Resolved Hospital Problem list     Assessment & Plan:  1) acute on chronic hypoxemic/hypercarbic resp falure prob OHS  with vol overload  CAP Left lung atelectasis -resolved >> very dyssynchronous on prvc when not sedated  so pm 12/17 > changed to PCV -On PEEP of 8, decrease as hypoxia improves -Proceed with bedside bronchoscopy 12/21 -Antibiotics have been stopped, no leukocytosis and low procalcitonin   2)  CRI with vol overload on ACEi PTA Lab Results  Component Value Date   CREATININE 2.84 (H) 10/25/2020   CREATININE 3.22 (H) 10/24/2020   CREATININE 3.46 (H) 10/23/2020   - holding  acei for now  -Albumin plus Lasix being tried by nephrology, no indication for dialysis  3) history of atrial flutter -Hold Lovenox in anticipation of procedure 12/21    4) sedation needs -Watch triglyceride on propofol, can transition to Precedex -Goal RASS 0 to -1  5) severe OSA -last seen by me 2019 where we had progressed  to tracheostomy capped and using auto CPAP with goal of eventual decannulation, lost to follow-up since then      Best practice (evaluated daily)   Diet: TFs Pain/Anxiety/Delirium protocol (if indicated): Precedex/as needed fentanyl VAP protocol (if indicated): Y  DVT prophylaxis: SCDs, hold Lovenox for procedure GI prophylaxis: Protonix Glucose control: per triad Mobility: bed rest for now  last date of multidisciplinary goals of care:  Per triad  Code Status: full  Disposition: ICU [facility]  Labs   CBC: Recent Labs  Lab 10/20/20 1217 10/22/20 0512 10/23/20 0358 10/24/20 0540 10/25/20 0618  WBC 8.1 5.6 6.5 6.5 5.8  NEUTROABS 6.2  --   --   --   --   HGB 14.5 12.5* 13.6 12.6* 12.6*  HCT 49.8 41.7 45.3 41.2 42.1  MCV 101.8* 99.8 99.6 99.0 100.2*  PLT 353 269 261 232 627    Basic Metabolic Panel: Recent Labs  Lab 10/21/20 0403 10/22/20 0512 10/23/20 0358 10/24/20 0402 10/25/20 0618  NA 139 142 141 142 145  K 4.2 4.1 3.8 4.0 3.9  CL 99 100 99 100 98  CO2 32 32 30 32 37*  GLUCOSE 88 62* 74 114* 120*  BUN 66* 74* 75* 72* 69*  CREATININE 2.76* 3.39* 3.46* 3.22* 2.84*  CALCIUM 8.8* 8.5* 8.6* 8.2* 8.8*  MG  --   --  2.0  --   --   PHOS  --   --  5.1*  --  4.6    GFR: Estimated Creatinine Clearance: 39.7 mL/min (A) (by C-G formula based on SCr of 2.84 mg/dL (H)). Recent Labs  Lab 10/20/20 1217 10/20/20 1454 10/22/20 0512 10/23/20 0358 10/24/20 0540 10/25/20 0618  PROCALCITON 0.12  --  0.61  --   --   --   WBC 8.1  --  5.6 6.5 6.5 5.8  LATICACIDVEN 0.7 0.6  --   --   --   --     Liver Function Tests: Recent Labs  Lab 10/20/20 1217 10/22/20 0512 10/24/20 0402 10/25/20 0618  AST 33 '22 29 27  ' ALT '30 20 16 16  ' ALKPHOS 114 84 71 75  BILITOT 0.4 1.0 0.6 0.8  PROT 7.9 6.2* 6.1* 6.9  ALBUMIN 3.2* 2.3* 2.6* 3.0*   No results for input(s): LIPASE, AMYLASE in the last 168 hours. No results for input(s): AMMONIA in the last 168 hours.  ABG    Component Value Date/Time   PHART 7.242 (L) 10/20/2020 2355   PCO2ART 79.3 (HH) 10/20/2020 2355   PO2ART 80.0 (L) 10/20/2020 2355   HCO3 27.6 10/20/2020 2355   O2SAT 93.6 10/20/2020 2355     Coagulation Profile: Recent Labs  Lab 10/20/20 1259  INR 1.5*    Cardiac Enzymes: No results for input(s): CKTOTAL, CKMB, CKMBINDEX, TROPONINI in the last 168 hours.  HbA1C: No results found for: HGBA1C  CBG: Recent Labs  Lab 10/24/20 2011 10/25/20 0054 10/25/20 0406 10/25/20 0751 10/25/20 1156  GLUCAP 125* 88 98 105* 112*      The patient is critically ill with multiple organ systems failure and requires high complexity decision making for assessment and support, frequent evaluation and titration of therapies, application of advanced monitoring technologies and extensive interpretation of multiple databases. Critical Care Time devoted to patient care services described in this note independent of APP/resident  time is 31 minutes.    Kara Mead MD. [facility] Flood. [facility] See Amion for pager  If no response to pager , please call  [place]  After 7:00 pm call Elink  [phone]   10/25/2020

## 2020-10-25 NOTE — Progress Notes (Signed)
PROGRESS NOTE  Jack Cox AJG:[id number] DOB: 10-Aug-1966 DOA: 10/20/2020 PCP: [name], MD   Brief History: 54 y.o.malewith medical history ofdiastolic CHF, atrial flutter, obstructive sleep apnea, hypertension, right bundle branch block presenting with shortness of breath for the last 2 to 3 days. Unfortunately, the patient is encephalopathic at the time of my evaluation; therefore, history is limited. Apparently, the patient went to see his nephrologist earlier on 10/20/2020. He was noted to have oxygen saturation in the 60s. As result, he was sent to the emergency department for further evaluation. Initially, the patient was awake and conversant with oxygen saturation in the low 70s. He was placed on a nonrebreather after which the patient became obtunded. VBG showed pH 7.1 66/97/106 on 80%. Subsequently, the patient was placed on the ventilator with PRVC,PEEP of 5, FiO2 70%. After approximately 10 to 15 minutes on the ventilator, the patient remained somnolent, but awakened and was able to tell me his name. He did state that he has not been taking his torsemide as directed. In addition, the patient has been sleeping in a recliner for an unclear duration of time secondary to shortness of breath. At baseline, the patient has a size 4 noncuffed tracheostomy. He is on supplemental oxygen 2.5 L during the day and 4 L at nighttime. There is no reports of nausea, vomiting, diarrhea, chest pain, abdominal pain. In the emergency department, the patient was afebrile and hemodynamically stable with oxygen saturation 93-94% on the ventilator with FiO2 70%. Chest x-ray showed left-sided pleural effusion with increased interstitial markings. BNP 790. PCT 0.12. BMP showed a serum creatinine 2.43. LFTs were unremarkable. WBC 8.1, hemoglobin 14.5, platelets 253,000. The patient was given ceftriaxone and azithromycin. He was also given furosemide 80 mg IV x1. COVID-19  RT-PCR negative.  He was continued on IV lasix 80 mg bid with uptrending of his serum creatinine.  Renal was consulted to assist.   Assessment/Plan: Acute on chronic respiratory failure with hypoxia and hypercarbia -Secondary to CHF in the setting of OSA -remainson ventilator -personally reviewed CXR--increased interstitial markings -Vent settings PC16,PEEP [id number]%>>>50% -Pulmonary consultappreciated -PCT 0.12>>0.61 -having mucus plugging--needs aggressive suctioning -12/17 CXR--white out of left lung -continue mucomyst and albuterol -12/18 repeat CXR--personally reviewed--bilateral effusions, increased interstitial markings; white out improved  Acute on chronic diastolic CHF/Cor Pulmonale -06/25/2019 echo EF 55%, trivial TR -12/16/21echo--EF 60-65%, no WMA, RV pressure overload, mild TR -restarted IV lasix with albumin per renal with excellent response -pt remains clinically fluid overloaded -Daily BMP -Accurate I's and O's--3700cc out last 24 hours -Daily weights  Atrial flutter -Continue apixaban if awake enough to tolerate p.o. 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 -Baseline creatinine 2.4-2.7 -Monitor with diuresis -will need tolerate increased serum creatinine for diuresis -consult nephrology--appreciated-->albumin+lasix bid per renal  FEN -having mild hypoglycemia -NG placed 12/17 -switched to Vital High Protein and increase rate to 40 cc/hr-->hypoglycemia improved  Fever -101.0 fever evening 12/18 -blood cultures x 2--neg to date -UA and urine culture--no significant pyuria -12/16 trach aspirate-->flora  Essential hypertension -Anticipate improvement with diuresis -restart metoprolol, but give IV for now  OSA -The patient has a tracheostomy placed in 2001 -He has a size 4 cuffless trach  Right bundle branch block -This has been chronic with review  of the medical records  Morbid obesity -BMI 52.94 -lifestyle modification      Status is:  Inpatient  Remains inpatient appropriate because:IV treatments appropriate due to intensity of illness or inability to take PO   Dispo: The patient is from:Home Anticipated d/c is NO:[facility] Anticipated d/c date is: 3 days Patient currently is not medically stable to d/c.        Family Communication:brotherupdated bedside 12/19  Consultants:pulm, renal  Code Status: FULL  DVT Prophylaxis: SCLovenox--treatment dose   Procedures: As Listed in Progress Note Above  Antibiotics: None    Subjective: Patient sedated on vent.  No respiratory distress. Tolerating enteral feeds.  No reports of distress, vomiting, diarrhea  Objective: Vitals:   10/25/20 0800 10/25/20 0830 10/25/20 0851 10/25/20 1000  BP: (!) 142/71 (!) 147/71  (!) 135/58  Pulse: 96 96  93  Resp: (!) 29 (!) 24  (!) 29  Temp:      TempSrc:      SpO2: 96% 93% 95% 92%  Weight:      Height:        Intake/Output Summary (Last 24 hours) at 10/25/2020 1104 Last data filed at 10/25/2020 0803 Gross per 24 hour  Intake 1855.19 ml  Output 3825 ml  Net -1969.81 ml   Weight change: 0.9 kg Exam:   General:  Pt is alert, sedated on vent  HEENT: No icterus, No thrush, No neck mass, [place]/AT  Cardiovascular: RRR, S1/S2, no rubs, no gallops  Respiratory: bilateral crackles.  Abdomen: Soft/+BS, non tender, non distended, no guarding  Extremities: 3+LE edema, No lymphangitis, No petechiae, No rashes, no synovitis   Data Reviewed: I have personally reviewed following labs and imaging studies Basic Metabolic Panel: Recent Labs  Lab 10/21/20 0403 10/22/20 0512 10/23/20 0358 10/24/20 0402 10/25/20 0618  NA 139 142 141 142 145  K 4.2 4.1 3.8 4.0 3.9  CL 99 100 99 100 98  CO2 32 32 30 32 37*  GLUCOSE 88 62* 74 114* 120*  BUN 66* 74* 75*  72* 69*  CREATININE 2.76* 3.39* 3.46* 3.22* 2.84*  CALCIUM 8.8* 8.5* 8.6* 8.2* 8.8*  MG  --   --  2.0  --   --   PHOS  --   --  5.1*  --  4.6   Liver Function Tests: Recent Labs  Lab 10/20/20 1217 10/22/20 0512 10/24/20 0402 10/25/20 0618  AST 33 _0 ALT _1 ALKPHOS 114 84 71 75  BILITOT 0.4 1.0 0.6 0.8  PROT 7.9 6.2* 6.1* 6.9  ALBUMIN 3.2* 2.3* 2.6* 3.0*   No results for input(s): LIPASE, AMYLASE in the last 168 hours. No results for input(s): AMMONIA in the last 168 hours. Coagulation Profile: Recent Labs  Lab 10/20/20 1259  INR 1.5*   CBC: Recent Labs  Lab 10/20/20 1217 10/22/20 0512 10/23/20 0358 10/24/20 0540 10/25/20 0618  WBC 8.1 5.6 6.5 6.5 5.8  NEUTROABS 6.2  --   --   --   --   HGB 14.5 12.5* 13.6 12.6* 12.6*  HCT 49.8 41.7 45.3 41.2 42.1  MCV 101.8* 99.8 99.6 99.0 100.2*  PLT 353 269 261 232 196   Cardiac Enzymes: No results for input(s): CKTOTAL, CKMB, CKMBINDEX, TROPONINI in the last 168 hours. BNP: Invalid input(s): POCBNP CBG: Recent Labs  Lab 10/24/20 1552 10/24/20 2011 10/25/20 0054 10/25/20 0406 10/25/20 0751  GLUCAP 117* 125* 88 98 105*   HbA1C: No results for input(s): HGBA1C in the last 72 hours. Urine analysis:    Component Value Date/Time   COLORURINE YELLOW 10/24/2020 1239  APPEARANCEUR CLEAR 10/24/2020 1239   LABSPEC 1.010 10/24/2020 1239   PHURINE 5.0 10/24/2020 1239   GLUCOSEU NEGATIVE 10/24/2020 1239   HGBUR NEGATIVE 10/24/2020 [place] 10/24/2020 1239   KETONESUR NEGATIVE 10/24/2020 1239   PROTEINUR 100 (A) 10/24/2020 1239   NITRITE NEGATIVE 10/24/2020 1239   LEUKOCYTESUR NEGATIVE 10/24/2020 1239   Sepsis Labs: _0 (procalcitonin:4,lacticidven:4) ) Recent Results (from the past 240 hour(s))  Resp Panel by RT-PCR (Flu A&B, Covid) Nasopharyngeal Swab     Status: None   Collection Time: 10/20/20 11:43 AM   Specimen: Nasopharyngeal Swab; Nasopharyngeal(NP) swabs in vial  transport medium  Result Value Ref Range Status   SARS Coronavirus 2 by RT PCR NEGATIVE NEGATIVE Final    Comment: (NOTE) SARS-CoV-2 target nucleic acids are NOT DETECTED.  The SARS-CoV-2 RNA is generally detectable in upper respiratory specimens during the acute phase of infection. The lowest concentration of SARS-CoV-2 viral copies this assay can detect is 138 copies/mL. A negative result does not preclude SARS-Cov-2 infection and should not be used as the sole basis for treatment or other patient management decisions. A negative result may occur with  improper specimen collection/handling, submission of specimen other than nasopharyngeal swab, presence of viral mutation(s) within the areas targeted by this assay, and inadequate number of viral copies(<138 copies/mL). A negative result must be combined with clinical observations, patient history, and epidemiological information. The expected result is Negative.  Fact Sheet for Patients:  EntrepreneurPulse.com.au  Fact Sheet for Healthcare Providers:  IncredibleEmployment.be  This test is no t yet approved or cleared by the [place] FDA and  has been authorized for detection and/or diagnosis of SARS-CoV-2 by FDA under an Emergency Use Authorization (EUA). This EUA will remain  in effect (meaning this test can be used) for the duration of the COVID-19 declaration under Section 564(b)(1) of the Act, 21 U.S.C.section 360bbb-3(b)(1), unless the authorization is terminated  or revoked sooner.       Influenza A by PCR NEGATIVE NEGATIVE Final   Influenza B by PCR NEGATIVE NEGATIVE Final    Comment: (NOTE) The Xpert Xpress SARS-CoV-2/FLU/RSV plus assay is intended as an aid in the diagnosis of influenza from Nasopharyngeal swab specimens and should not be used as a sole basis for treatment. Nasal washings and aspirates are unacceptable for Xpert Xpress SARS-CoV-2/FLU/RSV testing.  Fact  Sheet for Patients: EntrepreneurPulse.com.au  Fact Sheet for Healthcare Providers: IncredibleEmployment.be  This test is not yet approved or cleared by the [place] FDA and has been authorized for detection and/or diagnosis of SARS-CoV-2 by FDA under an Emergency Use Authorization (EUA). This EUA will remain in effect (meaning this test can be used) for the duration of the COVID-19 declaration under Section 564(b)(1) of the Act, 21 U.S.C. section 360bbb-3(b)(1), unless the authorization is terminated or revoked.  Performed at [facility], [address], [place], [place] [address]   Blood culture (routine x 2)     Status: None   Collection Time: 10/20/20 12:18 PM   Specimen: BLOOD  Result Value Ref Range Status   Specimen Description BLOOD  Final   Special Requests NONE  Final   Culture   Final    NO GROWTH 5 DAYS Performed at [facility], [address], [place], [place] [address]    Report Status 10/25/2020 FINAL  Final  Blood culture (routine x 2)     Status: None   Collection Time: 10/20/20 12:59 PM   Specimen: BLOOD RIGHT ARM  Result Value  Ref Range Status   Specimen Description BLOOD RIGHT ARM  Final   Special Requests   Final    BOTTLES DRAWN AEROBIC AND ANAEROBIC Blood Culture adequate volume   Culture   Final    NO GROWTH 5 DAYS Performed at [facility], [address] [place], [place] [address]    Report Status 10/25/2020 FINAL  Final  Urine culture     Status: None   Collection Time: 10/20/20  3:42 PM   Specimen: Urine, Catheterized  Result Value Ref Range Status   Specimen Description   Final    URINE, CATHETERIZED Performed at [facility], [address], [place], [place] [address]    Special Requests   Final    NONE Performed at [facility], [address] [place], [place] [address]    Culture   Final    NO GROWTH Performed at [facility], [address] [place], [place] [address]    Report Status  10/21/2020 FINAL  Final  MRSA PCR Screening     Status: None   Collection Time: 10/20/20  6:42 PM   Specimen: Nasal Mucosa; Nasopharyngeal  Result Value Ref Range Status   MRSA by PCR NEGATIVE NEGATIVE Final    Comment:        The GeneXpert MRSA Assay (FDA approved for NASAL specimens only), is one component of a comprehensive MRSA colonization surveillance program. It is not intended to diagnose MRSA infection nor to guide or monitor treatment for MRSA infections. Performed at [facility], [address] [place], [place] [address]   Culture, respiratory (non-expectorated)     Status: None   Collection Time: 10/21/20  8:20 AM   Specimen: Tracheal Aspirate; Respiratory  Result Value Ref Range Status   Specimen Description   Final    TRACHEAL ASPIRATE Performed at [facility], [address] [place], [place] [address]    Special Requests   Final    NONE Performed at [facility], [address], [place], [place] [address]    Gram Stain   Final    FEW WBC PRESENT, PREDOMINANTLY MONONUCLEAR NO ORGANISMS SEEN    Culture   Final    RARE Normal respiratory flora-no Staph aureus or Pseudomonas seen Performed at [facility] [address], [place], [place] [address]    Report Status 10/23/2020 FINAL  Final  Culture, blood (Routine X 2) w Reflex to ID Panel     Status: None (Preliminary result)   Collection Time: 10/24/20  1:34 PM   Specimen: BLOOD RIGHT HAND  Result Value Ref Range Status   Specimen Description   Final    BLOOD RIGHT HAND BOTTLES DRAWN AEROBIC AND ANAEROBIC   Special Requests Blood Culture adequate volume  Final   Culture   Final    NO GROWTH < 24 HOURS Performed at [facility], [address], [place], [place] [address]    Report Status PENDING  Incomplete  Culture, blood (Routine X 2) w Reflex to ID Panel     Status: None (Preliminary result)   Collection Time: 10/24/20  1:44 PM   Specimen: BLOOD LEFT WRIST  Result Value Ref Range Status    Specimen Description   Final    BLOOD LEFT WRIST BOTTLES DRAWN AEROBIC AND ANAEROBIC   Special Requests Blood Culture adequate volume  Final   Culture   Final    NO GROWTH < 24 HOURS Performed at [facility], [address], [place],  [address]  Report Status PENDING  Incomplete     Scheduled Meds: . acetylcysteine  1 mL Nebulization QID  . albuterol  2.5 mg Nebulization QID  . chlorhexidine gluconate (MEDLINE KIT)  15 mL Mouth Rinse BID  . Chlorhexidine Gluconate Cloth  6 each Topical Daily  . enoxaparin (LOVENOX) injection  130 mg Subcutaneous Q24H  . mouth rinse  15 mL Mouth Rinse 10 times per day  . metoprolol tartrate  2.5 mg Intravenous Q8H  . pantoprazole (PROTONIX) IV  40 mg Intravenous Q12H  . sodium chloride flush  3 mL Intravenous Q12H   Continuous Infusions: . sodium chloride    . sodium chloride    . amiodarone    . feeding supplement (VITAL HIGH PROTEIN) 1,000 mL (10/24/20 1548)  . propofol (DIPRIVAN) infusion 40 mcg/kg/min (10/25/20 0809)    Procedures/Studies: DG CHEST PORT 1 VIEW  Result Date: 10/23/2020 CLINICAL DATA:  Shortness of breath. EXAM: PORTABLE CHEST 1 VIEW COMPARISON:  October 22, 2020 FINDINGS: The near complete opacification of the left hemithorax seen on the comparison chest x-ray has resolved. Persistent bibasilar opacities are likely atelectasis. No pneumothorax. A tracheostomy tube is in good position. An NG tube terminates in the stomach. No other acute abnormalities. IMPRESSION: 1. Support apparatus as above. 2. Bibasilar atelectasis and a possible small left effusion. Improved aeration of the left lung. Electronically Signed   By: Dorise Bullion III M.D   On: 10/23/2020 14:57   DG Chest Port 1 View  Result Date: 10/22/2020 CLINICAL DATA:  Acute respiratory failure. EXAM: PORTABLE CHEST 1 VIEW COMPARISON:  10/20/2020 FINDINGS: Tracheostomy tube overlies the airway. The cardiac silhouette is enlarged. There is persistent mild  central pulmonary vascular congestion without overt edema. Airspace opacities in the left greater than right mid and lower lungs have slightly improved. Small pleural effusions are questioned. No pneumothorax is identified. IMPRESSION: Slight improvement of bilateral airspace opacities which could reflect pneumonia or atelectasis. Electronically Signed   By: [name] M.D.   On: 10/22/2020 09:16   DG Chest Port 1 View  Result Date: 10/20/2020 CLINICAL DATA:  Questionable sepsis EXAM: PORTABLE CHEST 1 VIEW COMPARISON:  Chest x-ray 06/12/2018 report without images. FINDINGS: Tracheostomy tube terminates approximately 7.5 cm above the carina. The heart size and mediastinal contours are not well visualized. Bilateral hilar vasculature prominence. Aortic arch calcifications. Silhouetting off of the left hemidiaphragm and left cardiac border. Partial silhouetting off the right cardiac border and right hemidiaphragm. Increased interstitial markings. Likely at least trace to small volume left pleural effusion. No pneumothorax. No acute osseous abnormality. IMPRESSION: 1. Left lung opacity and likely at least trace to small volume left pleural effusion with silhouetting off of the left diaphragm and heart border. Concern for infection/inflammation. 2. Right lower lobe and middle lobe airspace opacity could represent a combination of atelectasis, infection, inflammation. 3. At least mild pulmonary edema. 4. Consider chest x-ray PA and lateral view for further evaluation. Electronically Signed   By: Iven Finn M.D.   On: 10/20/2020 12:26   DG Chest Port 1V same Day  Result Date: 10/22/2020 CLINICAL DATA:  Status post NG tube placement. EXAM: PORTABLE CHEST 1 VIEW COMPARISON:  Single-view of the chest earlier today. FINDINGS: The upper chest is off the margin of the film. NG tube is looped in the upper trachea or esophagus. Visualized left chest is completely whited out on this study. IMPRESSION: NG tube is  looped in the upper esophagus or trachea. Complete whiteout of the left  chest is new since the study earlier today could be due to mucous plugging and left lung collapse or rapidly developing pleural effusion and airspace disease. Electronically Signed   By: [name] M.D.   On: 10/22/2020 15:16   DG Abd Portable 1V  Result Date: 10/22/2020 CLINICAL DATA:  Nasogastric tube placement EXAM: PORTABLE ABDOMEN - 1 VIEW COMPARISON:  Portable exam 1613 hours compared to 1523 hours FINDINGS: Tip of nasogastric tube projects over distal gastric antrum, consider withdrawal 5 cm. Bowel gas pattern normal. Opacification of the inferior LEFT hemithorax again seen. IMPRESSION: Tip of nasogastric tube projects over distal gastric antrum. Consider withdrawal of nasogastric tube 5 cm. Electronically Signed   By: [name] M.D.   On: 10/22/2020 16:36   DG Abd Portable 1V  Result Date: 10/22/2020 CLINICAL DATA:  Nasogastric tube placement EXAM: PORTABLE ABDOMEN - 1 VIEW COMPARISON:  None. FINDINGS: Nasogastric tube extends into the gastric cardia where it loops upon itself with the tip at the junction of the mid and distal thirds of the esophagus. There is no bowel dilatation or air-fluid level to suggest bowel obstruction. No free air. There is consolidation left lower lobe. IMPRESSION: Nasogastric tube extends into the stomach where it loops upon itself with the tip at the junction of the mid and distal thirds of the esophagus. No bowel obstruction or free air evident. Consolidation left lower lobe. These results will be called to the ordering clinician or representative by the Radiologist Assistant, and communication documented in the PACS or [employer]. Electronically Signed   By: Lowella Grip III M.D.   On: 10/22/2020 15:34   ECHOCARDIOGRAM COMPLETE  Result Date: 10/21/2020    ECHOCARDIOGRAM REPORT   Patient Name:   JAYZIAH BANKHEAD Date of Exam: 10/21/2020 Medical Rec #:  [id number]         Height:       68.0 in Accession #:    [id number]       Weight:       325.8 lb Date of Birth:  07/29/1966        BSA:          2.515 m Patient Age:    11 years         BP:           112/61 mmHg Patient Gender: M                HR:           51 bpm. Exam Location:  [facility] Procedure: 2D Echo Indications:    CHF-Acute Diastolic H41.93  History:        Patient has no prior history of Echocardiogram examinations.                 CHF; Risk Factors:Non-Smoker. Acute on chronic respiratory                 failure with hypoxia.  Sonographer:    Leavy Cella RDCS (AE) Referring Phys: [phone] Marlaya Turck IMPRESSIONS  1. Left ventricular ejection fraction, by estimation, is 60 to 65%. The left ventricle has normal function. The left ventricle has no regional wall motion abnormalities. There is severe left ventricular hypertrophy. Left ventricular diastolic parameters  are indeterminate. There is the interventricular septum is flattened in systole and diastole, consistent with right ventricular pressure and volume overload.  2. Right ventricular systolic function is mildly reduced. The right ventricular size is mildly enlarged. Tricuspid regurgitation signal is inadequate  for assessing PA pressure.  3. Right atrial size was upper normal.  4. The pericardial effusion is posterior to the left ventricle.  5. The mitral valve is grossly normal. Trivial mitral valve regurgitation.  6. The aortic valve is tricuspid. Aortic valve regurgitation is not visualized.  7. The inferior vena cava is normal in size with greater than 50% respiratory variability, suggesting right atrial pressure of 3 mmHg. FINDINGS  Left Ventricle: Left ventricular ejection fraction, by estimation, is 60 to 65%. The left ventricle has normal function. The left ventricle has no regional wall motion abnormalities. The left ventricular internal cavity size was normal in size. There is  severe left ventricular hypertrophy. The interventricular septum is flattened in  systole and diastole, consistent with right ventricular pressure and volume overload. Left ventricular diastolic parameters are indeterminate. Right Ventricle: The right ventricular size is mildly enlarged. No increase in right ventricular wall thickness. Right ventricular systolic function is mildly reduced. Tricuspid regurgitation signal is inadequate for assessing PA pressure. Left Atrium: Left atrial size was normal in size. Right Atrium: Right atrial size was upper normal. Pericardium: Trivial pericardial effusion is present. The pericardial effusion is posterior to the left ventricle. Mitral Valve: The mitral valve is grossly normal. Trivial mitral valve regurgitation. Tricuspid Valve: The tricuspid valve is grossly normal. Tricuspid valve regurgitation is mild. Aortic Valve: The aortic valve is tricuspid. There is moderate aortic valve annular calcification. Aortic valve regurgitation is not visualized. Pulmonic Valve: The pulmonic valve was grossly normal. Pulmonic valve regurgitation is trivial. Aorta: The aortic root is normal in size and structure. Venous: The inferior vena cava is normal in size with greater than 50% respiratory variability, suggesting right atrial pressure of 3 mmHg. IAS/Shunts: No atrial level shunt detected by color flow Doppler.  LEFT VENTRICLE PLAX 2D LVIDd:         4.66 cm  Diastology LVIDs:         2.03 cm  LV e' medial:    5.87 cm/s LV PW:         1.76 cm  LV E/e' medial:  14.0 LV IVS:        1.73 cm  LV e' lateral:   8.49 cm/s LVOT diam:     1.90 cm  LV E/e' lateral: 9.7 LVOT Area:     2.84 cm  RIGHT VENTRICLE RV S prime:     19.40 cm/s TAPSE (M-mode): 1.9 cm LEFT ATRIUM             Index       RIGHT ATRIUM           Index LA diam:        3.50 cm 1.39 cm/m  RA Area:     24.60 cm LA Vol (A2C):   43.9 ml 17.46 ml/m RA Volume:   82.30 ml  32.73 ml/m LA Vol (A4C):   43.8 ml 17.42 ml/m LA Biplane Vol: 44.4 ml 17.66 ml/m   AORTA Ao Root diam: 3.10 cm MITRAL VALVE MV Area (PHT):  2.64 cm    SHUNTS MV Decel Time: 287 msec    Systemic Diam: 1.90 cm MV E velocity: 82.30 cm/s MV A velocity: 59.10 cm/s MV E/A ratio:  1.39 Rozann Lesches MD Electronically signed by Rozann Lesches MD Signature Date/Time: 10/21/2020/4:33:43 PM    Final     [name], DO  [facility]  If 7PM-7AM, please contact night-coverage [email or web address] Password [email or web address] 10/25/2020, 11:04 AM   LOS: 5 days

## 2020-10-25 NOTE — Progress Notes (Signed)
Subjective:  Great UOP at 3700 over last 24 hours on lasix/albumin combo-  For last dose this AM, crt improving -  sedated on propofol-  Does arouse slightly to stimuli   Objective Vital signs in last 24 hours: Vitals:   10/25/20 0745 10/25/20 0800 10/25/20 0830 10/25/20 0851  BP: 132/60 (!) 142/71 (!) 147/71   Pulse: 99 96 96   Resp: (!) 31 (!) 29 (!) 24   Temp:      TempSrc:      SpO2: 95% 96% 93% 95%  Weight:      Height:       Weight change: 0.9 kg  Intake/Output Summary (Last 24 hours) at 10/25/2020 4854 Last data filed at 10/25/2020 6270 Gross per 24 hour  Intake 1855.19 ml  Output 4825 ml  Net -2969.81 ml    Assessment/ Plan: Pt is a 54 y.o. yo male with HTN, obesity, OSA with trach who was admitted on 10/20/2020 with hypoxia and volume overload  Assessment/Plan: 1.  Acute kidney injury on chronic kidney disease stage IIIb-IV: Recent baseline crt in the high 2's. This acute component likely hemodynamically mediated in the setting of what appears to be decompensated diastolic heart failure with creatinine rise following reinitiation of diuretic therapy (RAS activation).  I suspect he has an element of chronic cardiorenal syndrome as one of the underlying etiologies of his chronic kidney disease.    Urine output excellent overnight and he still has significant volume overload on exam-  Due for last scheduled dose of lasix/albumin this AM.  Home meds indicates that he was taking meloxicam-this will need to be stopped prior to his discharge. Continue strict Input and Output monitoring. Will monitor the patient with you and intervene or adjust therapy as indicated by changes in clinical status/labs.  He does not have any acute indications for dialysis at this time and any decision to undertake chronic dialysis will need to be after extensive discussions with the patient/family given his current comorbidities. Crt today is close to his recent baseline in the high 2's 2.  Acute  exacerbation of diastolic heart failure/cor pulmonale: Significant volume overload noted likely compounding his respiratory status, currently on scheduled lasix but last scheduled dose this AM- then will monitor  3.  Acute on chronic respiratory failure with hypoxia/hypercapnia: With chronic OHS/OSA and hypercapnia-recently worsened likely by CHF exacerbation.  Critical care assisting with ventilator adjustments. A metabolic alkalosis-  Possibly due to contraction alk-  Hold diuretics after dose this AM 4.  Secondary hyperparathyroidism: phosphorus OK.  Calcium level at goal when corrected for his albumin. 5.  Hypertension: Blood pressures transiently elevated, likely related to volume-  monitor with ongoing diuresis.  Louis Meckel    Labs: Basic Metabolic Panel: Recent Labs  Lab 10/23/20 0358 10/24/20 0402 10/25/20 0618  NA 141 142 145  K 3.8 4.0 3.9  CL 99 100 98  CO2 30 32 37*  GLUCOSE 74 114* 120*  BUN 75* 72* 69*  CREATININE 3.46* 3.22* 2.84*  CALCIUM 8.6* 8.2* 8.8*  PHOS 5.1*  --  4.6   Liver Function Tests: Recent Labs  Lab 10/22/20 0512 10/24/20 0402 10/25/20 0618  AST '22 29 27  ' ALT '20 16 16  ' ALKPHOS 84 71 75  BILITOT 1.0 0.6 0.8  PROT 6.2* 6.1* 6.9  ALBUMIN 2.3* 2.6* 3.0*   No results for input(s): LIPASE, AMYLASE in the last 168 hours. No results for input(s): AMMONIA in the last 168 hours. CBC: Recent Labs  Lab 10/20/20 1217 10/22/20 0512 10/23/20 0358 10/24/20 0540 10/25/20 0618  WBC 8.1 5.6 6.5 6.5 5.8  NEUTROABS 6.2  --   --   --   --   HGB 14.5 12.5* 13.6 12.6* 12.6*  HCT 49.8 41.7 45.3 41.2 42.1  MCV 101.8* 99.8 99.6 99.0 100.2*  PLT 353 269 261 232 196   Cardiac Enzymes: No results for input(s): CKTOTAL, CKMB, CKMBINDEX, TROPONINI in the last 168 hours. CBG: Recent Labs  Lab 10/24/20 1552 10/24/20 2011 10/25/20 0054 10/25/20 0406 10/25/20 0751  GLUCAP 117* 125* 88 98 105*    Iron Studies: No results for input(s): IRON,  TIBC, TRANSFERRIN, FERRITIN in the last 72 hours. Studies/Results: DG CHEST PORT 1 VIEW  Result Date: 10/23/2020 CLINICAL DATA:  Shortness of breath. EXAM: PORTABLE CHEST 1 VIEW COMPARISON:  October 22, 2020 FINDINGS: The near complete opacification of the left hemithorax seen on the comparison chest x-ray has resolved. Persistent bibasilar opacities are likely atelectasis. No pneumothorax. A tracheostomy tube is in good position. An NG tube terminates in the stomach. No other acute abnormalities. IMPRESSION: 1. Support apparatus as above. 2. Bibasilar atelectasis and a possible small left effusion. Improved aeration of the left lung. Electronically Signed   By: Dorise Bullion III M.D   On: 10/23/2020 14:57   Medications: Infusions: . sodium chloride    . sodium chloride    . amiodarone    . feeding supplement (VITAL HIGH PROTEIN) 1,000 mL (10/24/20 1548)  . propofol (DIPRIVAN) infusion 40 mcg/kg/min (10/25/20 0809)    Scheduled Medications: . acetylcysteine  1 mL Nebulization QID  . albuterol  2.5 mg Nebulization QID  . chlorhexidine gluconate (MEDLINE KIT)  15 mL Mouth Rinse BID  . Chlorhexidine Gluconate Cloth  6 each Topical Daily  . enoxaparin (LOVENOX) injection  130 mg Subcutaneous Q24H  . mouth rinse  15 mL Mouth Rinse 10 times per day  . metoprolol tartrate  2.5 mg Intravenous Q8H  . pantoprazole (PROTONIX) IV  40 mg Intravenous Q12H  . sodium chloride flush  3 mL Intravenous Q12H    have reviewed scheduled and prn medications.  Physical Exam: General: on propofol, arousable-  On vent per trach Heart: RRR Lungs: CBS-  fio2 down to 50% Abdomen: distended-  abd wall edema Extremities: pitting and chronic edema    10/25/2020,9:23 AM  LOS: 5 days

## 2020-10-25 NOTE — Progress Notes (Signed)
Pt having high BP with systolic in [id number]. APP [facility] notified via [email or web address]

## 2020-10-26 ENCOUNTER — Encounter (HOSPITAL_COMMUNITY)
Admission: EM | Disposition: A | Discharge status: Nursing Home (Includes ICF) | Payer: Self-pay | Source: Home / Self Care | Attending: Internal Medicine

## 2020-10-26 ENCOUNTER — Encounter (HOSPITAL_COMMUNITY): Payer: Self-pay

## 2020-10-26 ENCOUNTER — Inpatient Hospital Stay (HOSPITAL_COMMUNITY): Payer: Medicare Other

## 2020-10-26 DIAGNOSIS — J9622 Acute and chronic respiratory failure with hypercapnia: Secondary | ICD-10-CM | POA: Diagnosis not present

## 2020-10-26 DIAGNOSIS — J9621 Acute and chronic respiratory failure with hypoxia: Secondary | ICD-10-CM | POA: Diagnosis not present

## 2020-10-26 DIAGNOSIS — J189 Pneumonia, unspecified organism: Secondary | ICD-10-CM

## 2020-10-26 DIAGNOSIS — N179 Acute kidney failure, unspecified: Secondary | ICD-10-CM

## 2020-10-26 DIAGNOSIS — J9811 Atelectasis: Secondary | ICD-10-CM | POA: Diagnosis not present

## 2020-10-26 LAB — GLUCOSE, CAPILLARY
Glucose-Capillary: 126 mg/dL — ABNORMAL HIGH (ref 70–99)
Glucose-Capillary: 109 mg/dL — ABNORMAL HIGH (ref 70–99)
Glucose-Capillary: 100 mg/dL — ABNORMAL HIGH (ref 70–99)
Glucose-Capillary: 109 mg/dL — ABNORMAL HIGH (ref 70–99)
Glucose-Capillary: 119 mg/dL — ABNORMAL HIGH (ref 70–99)
Glucose-Capillary: 109 mg/dL — ABNORMAL HIGH (ref 70–99)

## 2020-10-26 LAB — RENAL FUNCTION PANEL
Albumin: 3.1 g/dL — ABNORMAL LOW (ref 3.5–5.0)
Anion gap: 13 (ref 5–15)
BUN: 60 mg/dL — ABNORMAL HIGH (ref 6–20)
CO2: 35 mmol/L — ABNORMAL HIGH (ref 22–32)
Calcium: 9.3 mg/dL (ref 8.9–10.3)
Chloride: 99 mmol/L (ref 98–111)
Creatinine, Ser: 2.4 mg/dL — ABNORMAL HIGH (ref 0.61–1.24)
GFR, Estimated: 31 mL/min — ABNORMAL LOW (ref >60)
Glucose, Bld: 124 mg/dL — ABNORMAL HIGH (ref 70–99)
Phosphorus: 3.8 mg/dL (ref 2.5–4.6)
Potassium: 4.1 mmol/L (ref 3.5–5.1)
Sodium: 147 mmol/L — ABNORMAL HIGH (ref 135–145)

## 2020-10-26 LAB — CBC
HCT: 49.3 % (ref 39.0–52.0)
Hemoglobin: 14.4 g/dL (ref 13.0–17.0)
MCH: 29 pg (ref 26.0–34.0)
MCHC: 29.2 g/dL — ABNORMAL LOW (ref 30.0–36.0)
MCV: 99.4 fL (ref 80.0–100.0)
Platelets: 194 10*3/uL (ref 150–400)
RBC: 4.96 MIL/uL (ref 4.22–5.81)
RDW: 13.9 % (ref 11.5–15.5)
WBC: 6.4 10*3/uL (ref 4.0–10.5)
nRBC: 0 % (ref 0.0–0.2)

## 2020-10-26 LAB — MAGNESIUM: Magnesium: 1.9 mg/dL (ref 1.7–2.4)

## 2020-10-26 SURGERY — BRONCHOSCOPY, FLEXIBLE
Anesthesia: Monitor Anesthesia Care | Laterality: Bilateral

## 2020-10-26 MED ORDER — SODIUM CHLORIDE 0.9 % IV SOLN
2.0000 g | Freq: Two times a day (BID) | INTRAVENOUS | Status: DC
  Administered 2020-10-26 – 2020-10-27 (×4): 2 g via INTRAVENOUS
  Filled 2020-10-26 (×4): qty 2

## 2020-10-26 MED ORDER — MIDAZOLAM HCL 2 MG/2ML IJ SOLN
INTRAMUSCULAR | Status: AC
  Administered 2020-10-26: 1 mg
  Filled 2020-10-26: qty 2

## 2020-10-26 NOTE — Procedures (Signed)
Bronchoscopy Procedure Note  Kenson Groh  [id number]  May 31, 1966  Date:10/26/20  Time:2:27 PM   Provider Performing:Petula Rotolo V. Harish Bram   Procedure(s):  Flexible bronchoscopy with bronchial alveolar lavage (18563)  Indication(s) bibasal pneumonia & Left atelectasis, mucus plugs, hemoptysis  Consent Risks of the procedure as well as the alternatives and risks of each were explained to the patient and/or caregiver.  Consent for the procedure was obtained and is signed in the bedside chart  Anesthesia Versed 1 mg, fentanyl 142mg   Time Out Verified patient identification, verified procedure, site/side was marked, verified correct patient position, special equipment/implants available, medications/allergies/relevant history reviewed, required imaging and test results available.   Sterile Technique Usual hand hygiene, masks, gowns, and gloves were used   Procedure Description Bronchoscope advanced through tracheostomy tube and into airway.  Airways were examined down to subsegmental level with findings noted below.   Following diagnostic evaluation, BAL(s) performed in BLL with normal saline and return of 10cc fluid  Findings: Thick inspissated mucus in proximal airways & BL lower lobes , removed with suctioning. No obvious bleeding or suction trauma   Complications/Tolerance None; patient tolerated the procedure well. Transient desaturation recovered with ventilation & PEEP 10 Chest X-ray is not needed post procedure.   EBL Minimal   Specimen(s) BAL for resp culture  Ermon Sagan V. AElsworth SohoMD

## 2020-10-26 NOTE — TOC Progression Note (Signed)
Transition of Care [facility]) - Progression Note    Patient Details  Name: Jack Cox MRN: [id number] Date of Birth: 10-17-1966  Transition of [facility]) CM/SW Contact  Shade Flood, LCSW Phone Number: 10/26/2020, 1:08 PM  Clinical Narrative:     TOC following. Pt status discussed with MD in Progression. Per MD, pt will likely have an extended hospital stay. Pt's [facility] Medicare requires pt be on a vent for 21 days before they would approve an LTAC transfer.   TOC will follow and continue to assist as needed.  Expected Discharge Plan: Home/Self Care Barriers to Discharge: Continued Medical Work up  Expected Discharge Plan and Services Expected Discharge Plan: Home/Self Care In-house Referral: Clinical Social Work Discharge Planning Services: NA   Living arrangements for the past 2 months: Single Family Home                 DME Arranged: N/A DME Agency: NA       HH Arranged: NA HH Agency: NA         Social Determinants of Health (SDOH) Interventions    Readmission Risk Interventions No flowsheet data found.

## 2020-10-26 NOTE — Progress Notes (Signed)
PROGRESS NOTE  Jack Cox QQI:[id number] DOB: Sep 09, 1966 DOA: 10/20/2020 PCP: [name], MD   Brief History: 54 y.o.malewith medical history ofdiastolic CHF, atrial flutter, obstructive sleep apnea, hypertension, right bundle branch block presenting with shortness of breath for the last 2 to 3 days. Unfortunately, the patient is encephalopathic at the time of my evaluation; therefore, history is limited. Apparently, the patient went to see his nephrologist earlier on 10/20/2020. He was noted to have oxygen saturation in the 60s. As result, he was sent to the emergency department for further evaluation. Initially, the patient was awake and conversant with oxygen saturation in the low 70s. He was placed on a nonrebreather after which the patient became obtunded. VBG showed pH 7.1 66/97/106 on 80%. Subsequently, the patient was placed on the ventilator with PRVC,PEEP of 5, FiO2 70%. After approximately 10 to 15 minutes on the ventilator, the patient remained somnolent, but awakened and was able to tell me his name. He did state that he has not been taking his torsemide as directed. In addition, the patient has been sleeping in a recliner for an unclear duration of time secondary to shortness of breath. At baseline, the patient has a size 4 noncuffed tracheostomy. He is on supplemental oxygen 2.5 L during the day and 4 L at nighttime. There is no reports of nausea, vomiting, diarrhea, chest pain, abdominal pain. In the emergency department, the patient was afebrile and hemodynamically stable with oxygen saturation 93-94% on the ventilator with FiO2 70%. Chest x-ray showed left-sided pleural effusion with increased interstitial markings. BNP 790. PCT 0.12. BMP showed a serum creatinine 2.43. LFTs were unremarkable. WBC 8.1, hemoglobin 14.5, platelets 253,000. The patient was given ceftriaxone and azithromycin. He was also given furosemide 80 mg IV x1. COVID-19  RT-PCR negative.  He was continued on IV lasix 80 mg bid with uptrending of his serum creatinine.  Renal was consulted to assist.   Assessment/Plan: Acute on chronic respiratory failure with hypoxia and hypercarbia -Secondary to CHF in the setting of OSA -remainson ventilator -10/26/20 personally reviewed CXR--increased interstitial markings--?retrocardiac opacity -Vent settings PC16,PEEP [id number]%>>>50% -Pulmonary consultappreciated -PCT 0.12>>0.61 -having mucus plugging--needs aggressive suctioning -12/17 CXR--white out of left lung -continue mucomyst and albuterol -10/26/20-bronchoscopy--Thick inspissated mucus in proximal airways & BL lower lobes, removed with suctioning  Acute on chronic diastolic CHF/Cor Pulmonale -06/25/2019 echo EF 55%, trivial TR -12/16/21echo--EF 60-65%, no WMA, RV pressure overload, mild TR -restarted IV lasix with albumin per renal with excellent response -pt remains clinically fluid overloaded -Daily BMP -Accurate I's and O's--NEG 12.7L -Daily weights  Atrial flutter -restart apixaban 10/27/20 if no further procedures planned -rate controlledbut had an episode 3-4 hours 12/18-12/19 evening -continue low dose IV lopressor  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 Nanine Means maintenance torsemide 40 mg daily 12/2  FEN -had mild hypoglycemia -NG placed 12/17 -switchedto Vital High Protein and increase rate to 40 cc/hr-->hypoglycemia improved  Fever -101.0 fever evening 12/18 and 100.7 12/21 -blood cultures x 2--neg to date -UA and urine culture--no significant pyuria -12/16 trach aspirate-->flora -10/26/20-->BAL sent for culture -start IV cefepime due to fevers, CXR findings  Essential hypertension -Anticipate improvement  with diuresis -restart metoprolol, but give IV for now  OSA -The patient has a tracheostomy placed in 2001 -He has a size 4 cuffless trach  Right bundle  branch block -This has been chronic with review of the medical records  Morbid obesity -BMI 52.94 -lifestyle modification  GOC -palliative following -currently full scope of care      Status is: Inpatient  Remains inpatient appropriate because:IV treatments appropriate due to intensity of illness or inability to take PO   Dispo: The patient is from:Home Anticipated d/c is KP:VVZS Anticipated d/c date is: 3 days Patient currently is not medically stable to d/c.        Family Communication:brotherupdated bedside 12/20  Consultants:pulm, renal  Code Status: FULL  DVT Prophylaxis: SCLovenox--treatment dose on hold for bronchoscopy   Procedures: As Listed in Progress Note Above  Antibiotics: Cefepime 12/21>>    Subjective: Patient sedated on vent.  Denies cp, sob, n/v/d, abd pain  Objective: Vitals:   10/26/20 1212 10/26/20 1245 10/26/20 1330 10/26/20 1430  BP:  134/82 (!) 153/86 105/69  Pulse:  88 88 (!) 137  Resp:  (!) 30 (!) 27 20  Temp:      TempSrc:      SpO2: 96% 95% 98% 93%  Weight:      Height:        Intake/Output Summary (Last 24 hours) at 10/26/2020 1444 Last data filed at 10/26/2020 0804 Gross per 24 hour  Intake 789.14 ml  Output 2505 ml  Net -1715.86 ml   Weight change: 6 kg Exam:   General:  Pt is alert, follows commands appropriately, not in acute distress  HEENT: No icterus, No thrush, No neck mass, [place]/AT  Cardiovascular: RRR, S1/S2, no rubs, no gallops  Respiratory: bilateral rales. No wheeze  Abdomen: Soft/+BS, non tender, non distended, no guarding  Extremities: 2+LE edema, No lymphangitis, No petechiae, No rashes, no synovitis   Data Reviewed: I have personally reviewed following labs  and imaging studies Basic Metabolic Panel: Recent Labs  Lab 10/22/20 0512 10/23/20 0358 10/24/20 0402 10/25/20 0618 10/26/20 0436  NA 142 141 142 145 147*  K 4.1 3.8 4.0 3.9 4.1  CL 100 99 100 98 99  CO2 32 30 32 37* 35*  GLUCOSE 62* 74 114* 120* 124*  BUN 74* 75* 72* 69* 60*  CREATININE 3.39* 3.46* 3.22* 2.84* 2.40*  CALCIUM 8.5* 8.6* 8.2* 8.8* 9.3  MG  --  2.0  --   --  1.9  PHOS  --  5.1*  --  4.6 3.8   Liver Function Tests: Recent Labs  Lab 10/20/20 1217 10/22/20 0512 10/24/20 0402 10/25/20 0618 10/26/20 0436  AST 33 _0 --   ALT _1 --   ALKPHOS 114 84 71 75  --   BILITOT 0.4 1.0 0.6 0.8  --   PROT 7.9 6.2* 6.1* 6.9  --   ALBUMIN 3.2* 2.3* 2.6* 3.0* 3.1*   No results for input(s): LIPASE, AMYLASE in the last 168 hours. No results for input(s): AMMONIA in the last 168 hours. Coagulation Profile: Recent Labs  Lab 10/20/20 1259  INR 1.5*   CBC: Recent Labs  Lab 10/20/20 1217 10/22/20 0512 10/23/20 0358 10/24/20 0540 10/25/20 0618 10/26/20 0436  WBC 8.1 5.6 6.5 6.5 5.8 6.4  NEUTROABS 6.2  --   --   --   --   --   HGB 14.5 12.5* 13.6 12.6* 12.6* 14.4  HCT 49.8 41.7 45.3 41.2 42.1 49.3  MCV 101.8* 99.8 99.6 99.0 100.2* 99.4  PLT 353 269 261 232 196 194   Cardiac Enzymes: No results for input(s): CKTOTAL, CKMB,  CKMBINDEX, TROPONINI in the last 168 hours. BNP: Invalid input(s): POCBNP CBG: Recent Labs  Lab 10/25/20 1927 10/25/20 2319 10/26/20 0425 10/26/20 0803 10/26/20 1129  GLUCAP 118* 139* 126* 109* 100*   HbA1C: No results for input(s): HGBA1C in the last 72 hours. Urine analysis:    Component Value Date/Time   COLORURINE YELLOW 10/24/2020 [place] 10/24/2020 1239   LABSPEC 1.010 10/24/2020 1239   PHURINE 5.0 10/24/2020 1239   GLUCOSEU NEGATIVE 10/24/2020 1239   HGBUR NEGATIVE 10/24/2020 1239   BILIRUBINUR NEGATIVE 10/24/2020 1239   KETONESUR NEGATIVE 10/24/2020 1239   PROTEINUR 100 (A) 10/24/2020  1239   NITRITE NEGATIVE 10/24/2020 1239   LEUKOCYTESUR NEGATIVE 10/24/2020 1239   Sepsis Labs: _0 (procalcitonin:4,lacticidven:4) ) Recent Results (from the past 240 hour(s))  Resp Panel by RT-PCR (Flu A&B, Covid) Nasopharyngeal Swab     Status: None   Collection Time: 10/20/20 11:43 AM   Specimen: Nasopharyngeal Swab; Nasopharyngeal(NP) swabs in vial transport medium  Result Value Ref Range Status   SARS Coronavirus 2 by RT PCR NEGATIVE NEGATIVE Final    Comment: (NOTE) SARS-CoV-2 target nucleic acids are NOT DETECTED.  The SARS-CoV-2 RNA is generally detectable in upper respiratory specimens during the acute phase of infection. The lowest concentration of SARS-CoV-2 viral copies this assay can detect is 138 copies/mL. A negative result does not preclude SARS-Cov-2 infection and should not be used as the sole basis for treatment or other patient management decisions. A negative result may occur with  improper specimen collection/handling, submission of specimen other than nasopharyngeal swab, presence of viral mutation(s) within the areas targeted by this assay, and inadequate number of viral copies(<138 copies/mL). A negative result must be combined with clinical observations, patient history, and epidemiological information. The expected result is Negative.  Fact Sheet for Patients:  EntrepreneurPulse.com.au  Fact Sheet for Healthcare Providers:  IncredibleEmployment.be  This test is no t yet approved or cleared by the [place] FDA and  has been authorized for detection and/or diagnosis of SARS-CoV-2 by FDA under an Emergency Use Authorization (EUA). This EUA will remain  in effect (meaning this test can be used) for the duration of the COVID-19 declaration under Section 564(b)(1) of the Act, 21 U.S.C.section 360bbb-3(b)(1), unless the authorization is terminated  or revoked sooner.       Influenza A by PCR NEGATIVE  NEGATIVE Final   Influenza B by PCR NEGATIVE NEGATIVE Final    Comment: (NOTE) The Xpert Xpress SARS-CoV-2/FLU/RSV plus assay is intended as an aid in the diagnosis of influenza from Nasopharyngeal swab specimens and should not be used as a sole basis for treatment. Nasal washings and aspirates are unacceptable for Xpert Xpress SARS-CoV-2/FLU/RSV testing.  Fact Sheet for Patients: EntrepreneurPulse.com.au  Fact Sheet for Healthcare Providers: IncredibleEmployment.be  This test is not yet approved or cleared by the [place] FDA and has been authorized for detection and/or diagnosis of SARS-CoV-2 by FDA under an Emergency Use Authorization (EUA). This EUA will remain in effect (meaning this test can be used) for the duration of the COVID-19 declaration under Section 564(b)(1) of the Act, 21 U.S.C. section 360bbb-3(b)(1), unless the authorization is terminated or revoked.  Performed at [facility], [address], [place], [place] [address]   Blood culture (routine x 2)     Status: None   Collection Time: 10/20/20 12:18 PM   Specimen: BLOOD  Result Value Ref Range Status   Specimen Description BLOOD  Final   Special Requests NONE  Final   Culture   Final    NO GROWTH 5 DAYS Performed at [facility], [address] [place], [place] [address]    Report Status 10/25/2020 FINAL  Final  Blood culture (routine x 2)     Status: None   Collection Time: 10/20/20 12:59 PM   Specimen: BLOOD RIGHT ARM  Result Value Ref Range Status   Specimen Description BLOOD RIGHT ARM  Final   Special Requests   Final    BOTTLES DRAWN AEROBIC AND ANAEROBIC Blood Culture adequate volume   Culture   Final    NO GROWTH 5 DAYS Performed at [facility], [address] [place], [place] [address]    Report Status 10/25/2020 FINAL  Final  Urine culture     Status: None   Collection Time: 10/20/20  3:42 PM   Specimen: Urine, Catheterized  Result Value Ref Range  Status   Specimen Description   Final    URINE, CATHETERIZED Performed at [facility] [address], [place], [place] [address]    Special Requests   Final    NONE Performed at [facility], [address], [place], [place] [address]    Culture   Final    NO GROWTH Performed at [facility], [place] [address] [place], [place] [address]    Report Status 10/21/2020 FINAL  Final  MRSA PCR Screening     Status: None   Collection Time: 10/20/20  6:42 PM   Specimen: Nasal Mucosa; Nasopharyngeal  Result Value Ref Range Status   MRSA by PCR NEGATIVE NEGATIVE Final    Comment:        The GeneXpert MRSA Assay (FDA approved for NASAL specimens only), is one component of a comprehensive MRSA colonization surveillance program. It is not intended to diagnose MRSA infection nor to guide or monitor treatment for MRSA infections. Performed at [facility], [address], [place], [place] [address]   Culture, respiratory (non-expectorated)     Status: None   Collection Time: 10/21/20  8:20 AM   Specimen: Tracheal Aspirate; Respiratory  Result Value Ref Range Status   Specimen Description   Final    TRACHEAL ASPIRATE Performed at [facility], [address] [place], [place] [address]    Special Requests   Final    NONE Performed at [facility], [address] [place], [place] [address]    Gram Stain   Final    FEW WBC PRESENT, PREDOMINANTLY MONONUCLEAR NO ORGANISMS SEEN    Culture   Final    RARE Normal respiratory flora-no Staph aureus or Pseudomonas seen Performed at [facility] [address], [place], [place] [address]    Report Status 10/23/2020 FINAL  Final  Culture, Urine     Status: None   Collection Time: 10/24/20 12:39 PM   Specimen: Urine, Catheterized  Result Value Ref Range Status   Specimen Description   Final    URINE, CATHETERIZED Performed at [facility], [address], [place], [place] [address]    Special Requests   Final    NONE Performed  at [facility], [address], [place], [place] [address]    Culture   Final    NO GROWTH Performed at [facility], [place] [address], [place], [place] [address]    Report Status 10/25/2020 FINAL  Final  Culture, blood (Routine X 2) w Reflex to ID Panel     Status: None (Preliminary result)   Collection Time:  10/24/20  1:34 PM   Specimen: BLOOD RIGHT HAND  Result Value Ref Range Status   Specimen Description   Final    BLOOD RIGHT HAND BOTTLES DRAWN AEROBIC AND ANAEROBIC   Special Requests Blood Culture adequate volume  Final   Culture   Final    NO GROWTH < 24 HOURS Performed at [facility], [address], [place], [place] [address]    Report Status PENDING  Incomplete  Culture, blood (Routine X 2) w Reflex to ID Panel     Status: None (Preliminary result)   Collection Time: 10/24/20  1:44 PM   Specimen: BLOOD LEFT WRIST  Result Value Ref Range Status   Specimen Description   Final    BLOOD LEFT WRIST BOTTLES DRAWN AEROBIC AND ANAEROBIC   Special Requests Blood Culture adequate volume  Final   Culture   Final    NO GROWTH < 24 HOURS Performed at [facility], [address] [place], [place] [address]    Report Status PENDING  Incomplete     Scheduled Meds: . acetylcysteine  1 mL Nebulization QID  . albuterol  2.5 mg Nebulization QID  . chlorhexidine gluconate (MEDLINE KIT)  15 mL Mouth Rinse BID  . Chlorhexidine Gluconate Cloth  6 each Topical Daily  . mouth rinse  15 mL Mouth Rinse 10 times per day  . metoprolol tartrate  2.5 mg Intravenous Q8H  . pantoprazole sodium  40 mg Per Tube Q1200  . sodium chloride flush  3 mL Intravenous Q12H   Continuous Infusions: . sodium chloride    . sodium chloride    . amiodarone    . dexmedetomidine (PRECEDEX) IV infusion 0.7 mcg/kg/hr (10/26/20 1228)  . feeding supplement (VITAL HIGH PROTEIN) 1,000 mL (10/24/20 1548)    Procedures/Studies: DG Chest Port 1 View  Result Date: 10/26/2020 CLINICAL DATA:  Shortness of  breath for 2-3 days EXAM: PORTABLE CHEST 1 VIEW COMPARISON:  Three days ago FINDINGS: Progressive hazy appearance of the right mid to lower chest. Dense retrocardiac opacity persists. An enteric tube and tracheostomy tube remain in place. Cardiomegaly and vascular pedicle widening. IMPRESSION: Pleural fluid/pulmonary opacification at the bases which has progressed from 3 days ago. Electronically Signed   By: Monte Fantasia M.D.   On: 10/26/2020 06:02   DG CHEST PORT 1 VIEW  Result Date: 10/23/2020 CLINICAL DATA:  Shortness of breath. EXAM: PORTABLE CHEST 1 VIEW COMPARISON:  October 22, 2020 FINDINGS: The near complete opacification of the left hemithorax seen on the comparison chest x-ray has resolved. Persistent bibasilar opacities are likely atelectasis. No pneumothorax. A tracheostomy tube is in good position. An NG tube terminates in the stomach. No other acute abnormalities. IMPRESSION: 1. Support apparatus as above. 2. Bibasilar atelectasis and a possible small left effusion. Improved aeration of the left lung. Electronically Signed   By: Dorise Bullion III M.D   On: 10/23/2020 14:57   DG Chest Port 1 View  Result Date: 10/22/2020 CLINICAL DATA:  Acute respiratory failure. EXAM: PORTABLE CHEST 1 VIEW COMPARISON:  10/20/2020 FINDINGS: Tracheostomy tube overlies the airway. The cardiac silhouette is enlarged. There is persistent mild central pulmonary vascular congestion without overt edema. Airspace opacities in the left greater than right mid and lower lungs have slightly improved. Small pleural effusions are questioned. No pneumothorax is identified. IMPRESSION: Slight improvement of bilateral airspace opacities which could reflect pneumonia or atelectasis. Electronically Signed   By: [name] M.D.   On: 10/22/2020 09:16   DG  Chest Port 1 View  Result Date: 10/20/2020 CLINICAL DATA:  Questionable sepsis EXAM: PORTABLE CHEST 1 VIEW COMPARISON:  Chest x-ray 06/12/2018 report without  images. FINDINGS: Tracheostomy tube terminates approximately 7.5 cm above the carina. The heart size and mediastinal contours are not well visualized. Bilateral hilar vasculature prominence. Aortic arch calcifications. Silhouetting off of the left hemidiaphragm and left cardiac border. Partial silhouetting off the right cardiac border and right hemidiaphragm. Increased interstitial markings. Likely at least trace to small volume left pleural effusion. No pneumothorax. No acute osseous abnormality. IMPRESSION: 1. Left lung opacity and likely at least trace to small volume left pleural effusion with silhouetting off of the left diaphragm and heart border. Concern for infection/inflammation. 2. Right lower lobe and middle lobe airspace opacity could represent a combination of atelectasis, infection, inflammation. 3. At least mild pulmonary edema. 4. Consider chest x-ray PA and lateral view for further evaluation. Electronically Signed   By: Iven Finn M.D.   On: 10/20/2020 12:26   DG Chest Port 1V same Day  Result Date: 10/22/2020 CLINICAL DATA:  Status post NG tube placement. EXAM: PORTABLE CHEST 1 VIEW COMPARISON:  Single-view of the chest earlier today. FINDINGS: The upper chest is off the margin of the film. NG tube is looped in the upper trachea or esophagus. Visualized left chest is completely whited out on this study. IMPRESSION: NG tube is looped in the upper esophagus or trachea. Complete whiteout of the left chest is new since the study earlier today could be due to mucous plugging and left lung collapse or rapidly developing pleural effusion and airspace disease. Electronically Signed   By: [name] M.D.   On: 10/22/2020 15:16   DG Abd Portable 1V  Result Date: 10/22/2020 CLINICAL DATA:  Nasogastric tube placement EXAM: PORTABLE ABDOMEN - 1 VIEW COMPARISON:  Portable exam 1613 hours compared to 1523 hours FINDINGS: Tip of nasogastric tube projects over distal gastric antrum, consider  withdrawal 5 cm. Bowel gas pattern normal. Opacification of the inferior LEFT hemithorax again seen. IMPRESSION: Tip of nasogastric tube projects over distal gastric antrum. Consider withdrawal of nasogastric tube 5 cm. Electronically Signed   By: [name] M.D.   On: 10/22/2020 16:36   DG Abd Portable 1V  Result Date: 10/22/2020 CLINICAL DATA:  Nasogastric tube placement EXAM: PORTABLE ABDOMEN - 1 VIEW COMPARISON:  None. FINDINGS: Nasogastric tube extends into the gastric cardia where it loops upon itself with the tip at the junction of the mid and distal thirds of the esophagus. There is no bowel dilatation or air-fluid level to suggest bowel obstruction. No free air. There is consolidation left lower lobe. IMPRESSION: Nasogastric tube extends into the stomach where it loops upon itself with the tip at the junction of the mid and distal thirds of the esophagus. No bowel obstruction or free air evident. Consolidation left lower lobe. These results will be called to the ordering clinician or representative by the Radiologist Assistant, and communication documented in the PACS or [employer]. Electronically Signed   By: Lowella Grip III M.D.   On: 10/22/2020 15:34   ECHOCARDIOGRAM COMPLETE  Result Date: 10/21/2020    ECHOCARDIOGRAM REPORT   Patient Name:   Jack Cox Date of Exam: 10/21/2020 Medical Rec #:  [id number]        Height:       68.0 in Accession #:    [id number]       Weight:       325.8 lb Date of  Birth:  Apr 02, 1966        BSA:          2.515 m Patient Age:    43 years         BP:           112/61 mmHg Patient Gender: M                HR:           51 bpm. Exam Location:  [facility] Procedure: 2D Echo Indications:    CHF-Acute Diastolic Q46.96  History:        Patient has no prior history of Echocardiogram examinations.                 CHF; Risk Factors:Non-Smoker. Acute on chronic respiratory                 failure with hypoxia.  Sonographer:    Leavy Cella RDCS (AE)  Referring Phys: [phone] Jack Cox IMPRESSIONS  1. Left ventricular ejection fraction, by estimation, is 60 to 65%. The left ventricle has normal function. The left ventricle has no regional wall motion abnormalities. There is severe left ventricular hypertrophy. Left ventricular diastolic parameters  are indeterminate. There is the interventricular septum is flattened in systole and diastole, consistent with right ventricular pressure and volume overload.  2. Right ventricular systolic function is mildly reduced. The right ventricular size is mildly enlarged. Tricuspid regurgitation signal is inadequate for assessing PA pressure.  3. Right atrial size was upper normal.  4. The pericardial effusion is posterior to the left ventricle.  5. The mitral valve is grossly normal. Trivial mitral valve regurgitation.  6. The aortic valve is tricuspid. Aortic valve regurgitation is not visualized.  7. The inferior vena cava is normal in size with greater than 50% respiratory variability, suggesting right atrial pressure of 3 mmHg. FINDINGS  Left Ventricle: Left ventricular ejection fraction, by estimation, is 60 to 65%. The left ventricle has normal function. The left ventricle has no regional wall motion abnormalities. The left ventricular internal cavity size was normal in size. There is  severe left ventricular hypertrophy. The interventricular septum is flattened in systole and diastole, consistent with right ventricular pressure and volume overload. Left ventricular diastolic parameters are indeterminate. Right Ventricle: The right ventricular size is mildly enlarged. No increase in right ventricular wall thickness. Right ventricular systolic function is mildly reduced. Tricuspid regurgitation signal is inadequate for assessing PA pressure. Left Atrium: Left atrial size was normal in size. Right Atrium: Right atrial size was upper normal. Pericardium: Trivial pericardial effusion is present. The pericardial effusion is  posterior to the left ventricle. Mitral Valve: The mitral valve is grossly normal. Trivial mitral valve regurgitation. Tricuspid Valve: The tricuspid valve is grossly normal. Tricuspid valve regurgitation is mild. Aortic Valve: The aortic valve is tricuspid. There is moderate aortic valve annular calcification. Aortic valve regurgitation is not visualized. Pulmonic Valve: The pulmonic valve was grossly normal. Pulmonic valve regurgitation is trivial. Aorta: The aortic root is normal in size and structure. Venous: The inferior vena cava is normal in size with greater than 50% respiratory variability, suggesting right atrial pressure of 3 mmHg. IAS/Shunts: No atrial level shunt detected by color flow Doppler.  LEFT VENTRICLE PLAX 2D LVIDd:         4.66 cm  Diastology LVIDs:         2.03 cm  LV e' medial:    5.87 cm/s LV PW:  1.76 cm  LV E/e' medial:  14.0 LV IVS:        1.73 cm  LV e' lateral:   8.49 cm/s LVOT diam:     1.90 cm  LV E/e' lateral: 9.7 LVOT Area:     2.84 cm  RIGHT VENTRICLE RV S prime:     19.40 cm/s TAPSE (M-mode): 1.9 cm LEFT ATRIUM             Index       RIGHT ATRIUM           Index LA diam:        3.50 cm 1.39 cm/m  RA Area:     24.60 cm LA Vol (A2C):   43.9 ml 17.46 ml/m RA Volume:   82.30 ml  32.73 ml/m LA Vol (A4C):   43.8 ml 17.42 ml/m LA Biplane Vol: 44.4 ml 17.66 ml/m   AORTA Ao Root diam: 3.10 cm MITRAL VALVE MV Area (PHT): 2.64 cm    SHUNTS MV Decel Time: 287 msec    Systemic Diam: 1.90 cm MV E velocity: 82.30 cm/s MV A velocity: 59.10 cm/s MV E/A ratio:  1.39 Rozann Lesches MD Electronically signed by Rozann Lesches MD Signature Date/Time: 10/21/2020/4:33:43 PM    Final     [name], DO  [facility]  If 7PM-7AM, please contact night-coverage [email or web address] Password [email or web address] 10/26/2020, 2:44 PM   LOS: 6 days

## 2020-10-26 NOTE — Progress Notes (Signed)
Pharmacy Antibiotic Note  Jack Cox is a 54 y.o. male admitted on 10/20/2020 with HCAP.  Pharmacy has been consulted for cefepime dosing.  Plan: Cefepime 2gm IV q12h F/U cxs and clinical progress Monitor V/S, labs  Height: 5\' 8"  (172.7 cm) Weight: (!) 139.4 kg (307 lb 5.1 oz) IBW/kg (Calculated) : 68.4  Temp (24hrs), Avg:99.2 F (37.3 C), Min:98.1 F (36.7 C), Max:100.7 F (38.2 C)  Recent Labs  Lab 10/20/20 1217 10/20/20 1224 10/20/20 1454 10/21/20 0403 10/22/20 0512 10/23/20 0358 10/24/20 0402 10/24/20 0540 10/25/20 0618 10/26/20 0436  WBC 8.1  --   --   --  5.6 6.5  --  6.5 5.8 6.4  CREATININE 2.43*   < >  --    < > 3.39* 3.46* 3.22*  --  2.84* 2.40*  LATICACIDVEN 0.7  --  0.6  --   --   --   --   --   --   --    < > = values in this interval not displayed.    Estimated Creatinine Clearance: 48.2 mL/min (A) (by C-G formula based on SCr of 2.4 mg/dL (H)).   Normalized CrCL 18mls/min  No Known Allergies  Antimicrobials this admission: Cefepime 12/21>> Azith 12/15 x 1 CTX 12/15 x 1   Dose adjustments this admission: prn  Microbiology results: 12/19 BCx: ngtd 12/19 UCx: no growth 12/15 MRSA PCR: negative  Thank you for allowing pharmacy to be a part of this patient's care.  Isac Sarna, BS [place], [place] Clinical Pharmacist Pager [phone] 10/26/2020 2:54 PM

## 2020-10-26 NOTE — Progress Notes (Signed)
Nutrition Follow-up  DOCUMENTATION CODES:   Morbid obesity  INTERVENTION:  Resume tube feeding following procedure- Vital High Protein @ 40 ml/hr via OGT.  Add ProSource TF- 90 ml -TID via OGT  Tube feeding regimen provides 1200 kcal, 150 grams of protein, and 802 ml of H2O.  Sedation provides : 464 kcal. Total kcal-1664 kcal (100% est energy and protein needs met).   NUTRITION DIAGNOSIS:   Inadequate oral intake related to acute illness (respiratory failure) as evidenced by NPO status.  -Addressed via enteral nutrition   GOAL:   Provide needs based on ASPEN/SCCM guidelines  -Progressing  MONITOR:   Vent status,Labs,TF tolerance,I & O's,Skin  REASON FOR ASSESSMENT:   Ventilator    ASSESSMENT:  Patient is a 54 yo male with history of CKD, CHF. GOUT, Hypertension and Tracheostomy dependence. Chest x-ray findings- left side pleural effusion.  Patient is intubated on ventilator support since 12/15. Patient discussed in rounds and with bedside nurse. Bedside bronchoscopy planned for today. Tube feeding being held at this time.  Weaning in process- tolerating well per report.  MV: 9.5 L/min Temp (24hrs), Avg:98.8 F (37.1 C), Min:98.1 F (36.7 C), Max:99.7 F (37.6 C)  Sedation: Propofol- d/c. Precedex @ 26.7 ml/hr  Medications reviewed and include: Lasix, levophed  Labs: BMP Latest Ref Rng & Units 10/26/2020 10/25/2020 10/24/2020  Glucose 70 - 99 mg/dL 124(H) 120(H) 114(H)  BUN 6 - 20 mg/dL 60(H) 69(H) 72(H)  Creatinine 0.61 - 1.24 mg/dL 2.40(H) 2.84(H) 3.22(H)  Sodium 135 - 145 mmol/L 147(H) 145 142  Potassium 3.5 - 5.1 mmol/L 4.1 3.9 4.0  Chloride 98 - 111 mmol/L 99 98 100  CO2 22 - 32 mmol/L 35(H) 37(H) 32  Calcium 8.9 - 10.3 mg/dL 9.3 8.8(L) 8.2(L)      Intake/Output Summary (Last 24 hours) at 10/26/2020 1122 Last data filed at 10/26/2020 1740 Gross per 24 hour  Intake 789.14 ml  Output 3955 ml  Net -3165.86 ml     Diet Order:   Diet Order             Diet NPO time specified  Diet effective midnight                 EDUCATION NEEDS:   Not appropriate for education at this time  Skin:  Skin Assessment: Reviewed RN Assessment  Last BM:  unknown   Height:   Ht Readings from Last 1 Encounters:  10/26/20 _0  (1.727 m)    Weight:   Wt Readings from Last 1 Encounters:  10/26/20 (!) 139.4 kg  admit wt-146.6 kg  Ideal Body Weight:   70 kg  BMI:  Body mass index is 46.73 kg/m.  Estimated Nutritional Needs:   Kcal:  8144-8185  Protein:  140-154  Fluid:  < 2 liters  Colman Cater MS,RD,CSG,LDN Pager: Shea Evans

## 2020-10-26 NOTE — Progress Notes (Signed)
Daily Progress Note   Patient Name: Jack Cox       Date: 10/26/2020 DOB: 1966/04/17  Age: 54 y.o. MRN#: [id number] Attending Physician: [name], MD Primary Care Physician: [name], MD Admit Date: 10/20/2020  Reason for Consultation/Follow-up: Establishing goals of care  Subjective/GOC: Patient awake, alert, and following commands. NT at bedside shaving Jack Cox. Jack Cox will nod head yes/no to simple questions and mouthing sentences that NT can interpret accurately.   Discussed course of hospitalization with Jack Cox and plan for today including bronch. Jack Cox nods head yes. Reassured of ongoing support throughout admission.   No family at bedside.   Length of Stay: 6  Current Medications: Scheduled Meds:  . acetylcysteine  1 mL Nebulization QID  . albuterol  2.5 mg Nebulization QID  . chlorhexidine gluconate (MEDLINE KIT)  15 mL Mouth Rinse BID  . Chlorhexidine Gluconate Cloth  6 each Topical Daily  . mouth rinse  15 mL Mouth Rinse 10 times per day  . metoprolol tartrate  2.5 mg Intravenous Q8H  . pantoprazole sodium  40 mg Per Tube Q1200  . sodium chloride flush  3 mL Intravenous Q12H    Continuous Infusions: . sodium chloride    . sodium chloride    . amiodarone    . dexmedetomidine (PRECEDEX) IV infusion 0.8 mcg/kg/hr (10/26/20 0839)  . feeding supplement (VITAL HIGH PROTEIN) 1,000 mL (10/24/20 1548)    PRN Meds: sodium chloride, acetaminophen, fentaNYL (SUBLIMAZE) injection, LORazepam, ondansetron (ZOFRAN) IV, sodium chloride flush  Physical Exam Vitals and nursing note reviewed.  Constitutional:      General: He is awake.  HENT:     Head: Normocephalic and atraumatic.  Cardiovascular:     Rate and Rhythm: Normal rate.  Pulmonary:     Effort: No  tachypnea, accessory muscle usage or respiratory distress.     Breath sounds: Decreased breath sounds present.     Comments: Trach/vent Skin:    General: Skin is warm and dry.  Neurological:     Mental Status: He is alert.     Comments: Awake, alert on vent. Following commands. Nods head yes/no to questions. Mouthing sentences to NT at bedside.            Vital Signs: BP (!) 155/94   Pulse 81   Temp (!) 100.7 F (38.2  C) (Axillary)   Resp (!) 25   Ht $R'5\' 8"'CC$  (1.727 m)   Wt (!) 139.4 kg   SpO2 96%   BMI 46.73 kg/m  SpO2: SpO2: 96 % O2 Device: O2 Device: Ventilator O2 Flow Rate: O2 Flow Rate (L/min): 2 L/min  Intake/output summary:   Intake/Output Summary (Last 24 hours) at 10/26/2020 1150 Last data filed at 10/26/2020 5465 Gross per 24 hour  Intake 789.14 ml  Output 2505 ml  Net -1715.86 ml   LBM:   Baseline Weight: Weight: (!) 153.3 kg Most recent weight: Weight: (!) 139.4 kg       Palliative Assessment/Data: PPS 30%      Patient Active Problem List   Diagnosis Date Noted  . Atelectasis, left   . Acute on chronic respiratory failure ([place]) 10/20/2020  . Acute on chronic respiratory failure with hypoxia and hypercapnia ([place]) 10/20/2020  . Acute on chronic diastolic CHF (congestive heart failure) ([place]) 10/20/2020  . Morbid obesity with BMI of 50.0-59.9, adult ([place]) 10/20/2020  . CKD (chronic kidney disease) stage 4, GFR 15-29 ml/min ([facility]) 10/20/2020  . Chronic respiratory failure with hypoxia ([place]) 02/05/2018  . OSA (obstructive sleep apnea) 10/23/2017  . Tracheostomy dependent ([place]) 10/23/2017    Palliative Care Assessment & Plan   Patient Profile: 54 y.o. male  with past medical history of chronic kidney disease, diastolic congestive heart failure, atrial flutter, obstructive sleep apnea status post trach placement, chronic respiratory failure on 2 L of oxygen at home during the day and 4 L at night hypertension, right bundle branch block, admitted on  10/20/2020 with increasing shortness of breath after presenting his nephrologist in findings of oxygen saturation at 70%.  He reported ongoing worsening shortness of breath over the past several days.  In the emergency room he continued to decline and eventually required placement on the ventilator.  He is blood gases showed that he was hypercarbic and hypoxic.  Chest x-ray showed pleural effusion and infiltrates concerning for pneumonia.  He is being treated for congestive heart failure exacerbation superimposed on pneumonia.  Palliative medicine consulted for goals of care.  Assessment: Acute on chronic respiratory failure with hypoxia and hypercarbia CHF exacerbation Acute on chronic diastolic heart failure/cor pulmonale OSA Chronic tracheostomy AKI on CKD stage IV Atrial flutter Essential hypertension Morbid obesity  Recommendations/Plan: Full code/full scope Continue current plan of care and medical management. Cognitive status improved today. PCCM following. Bronch today. PMT will continue to follow. Will attempt further [place] discussions with patient if cognitive status continues to improve. If not, will need to involve patient's brothers and ex-wife in decision making.   Goals of Care and Additional Recommendations: Limitations on Scope of Treatment: Full Scope Treatment  Code Status: FULL   Code Status Orders  (From admission, onward)         Start     Ordered   10/20/20 1847  Full code  Continuous        10/20/20 1846        Code Status History    This patient has a current code status but no historical code status.   Advance Care Planning Activity      Prognosis:  Unable to determine  Discharge Planning: To Be Determined  Care plan was discussed with RN, Dr. Carles Collet, patient  Thank you for allowing the Palliative Medicine Team to assist in the care of this patient.   Total Time 20 Prolonged Time Billed  no      Greater  than 50%  of this time was spent  counseling and coordinating care related to the above assessment and plan.  Ihor Dow, DNP, FNP-C Palliative Medicine Team  Phone: [phone] Fax: [phone]  Please contact Palliative Medicine Team phone at [phone] for questions and concerns.

## 2020-10-26 NOTE — Progress Notes (Signed)
RT assisted MD with bedside bronch. BAL performed and sputum was sent to lab

## 2020-10-26 NOTE — Progress Notes (Signed)
Subjective:  Last lasix/alb combo yesterday, uop ~3.9L. bronch today.  Objective Vital signs in last 24 hours: Vitals:   10/26/20 0715 10/26/20 0800 10/26/20 0804 10/26/20 0821  BP: (!) 173/98 (!) 167/94    Pulse: 74 78    Resp: 20 20    Temp:   99.7 F (37.6 C)   TempSrc:   Axillary   SpO2: 97% 97%  97%  Weight:      Height:       Weight change: 6 kg  Intake/Output Summary (Last 24 hours) at 10/26/2020 0837 Last data filed at 10/26/2020 0804 Gross per 24 hour  Intake 789.14 ml  Output 3955 ml  Net -3165.86 ml   Physical Exam: General: arousable-  On vent per trach Heart: RRR Lungs: CBS-  fio2  60% Abdomen: slightly distended-  abd wall edema Extremities: pitting and chronic edema around thigh  Labs: Basic Metabolic Panel: Recent Labs  Lab 10/23/20 0358 10/24/20 0402 10/25/20 0618 10/26/20 0436  NA 141 142 145 147*  K 3.8 4.0 3.9 4.1  CL 99 100 98 99  CO2 30 32 37* 35*  GLUCOSE 74 114* 120* 124*  BUN 75* 72* 69* 60*  CREATININE 3.46* 3.22* 2.84* 2.40*  CALCIUM 8.6* 8.2* 8.8* 9.3  PHOS 5.1*  --  4.6 3.8   Liver Function Tests: Recent Labs  Lab 10/22/20 0512 10/24/20 0402 10/25/20 0618 10/26/20 0436  AST _0 --   ALT _1 --   ALKPHOS 84 71 75  --   BILITOT 1.0 0.6 0.8  --   PROT 6.2* 6.1* 6.9  --   ALBUMIN 2.3* 2.6* 3.0* 3.1*   No results for input(s): LIPASE, AMYLASE in the last 168 hours. No results for input(s): AMMONIA in the last 168 hours. CBC: Recent Labs  Lab 10/20/20 1217 10/22/20 0512 10/23/20 0358 10/24/20 0540 10/25/20 0618 10/26/20 0436  WBC 8.1 5.6 6.5 6.5 5.8 6.4  NEUTROABS 6.2  --   --   --   --   --   HGB 14.5 12.5* 13.6 12.6* 12.6* 14.4  HCT 49.8 41.7 45.3 41.2 42.1 49.3  MCV 101.8* 99.8 99.6 99.0 100.2* 99.4  PLT 353 269 261 232 196 194   Cardiac Enzymes: No results for input(s): CKTOTAL, CKMB, CKMBINDEX, TROPONINI in the last 168 hours. CBG: Recent Labs  Lab 10/25/20 1613 10/25/20 1927 10/25/20 2319  10/26/20 0425 10/26/20 0803  GLUCAP 110* 118* 139* 126* 109*    Iron Studies: No results for input(s): IRON, TIBC, TRANSFERRIN, FERRITIN in the last 72 hours. Studies/Results: DG Chest Port 1 View  Result Date: 10/26/2020 CLINICAL DATA:  Shortness of breath for 2-3 days EXAM: PORTABLE CHEST 1 VIEW COMPARISON:  Three days ago FINDINGS: Progressive hazy appearance of the right mid to lower chest. Dense retrocardiac opacity persists. An enteric tube and tracheostomy tube remain in place. Cardiomegaly and vascular pedicle widening. IMPRESSION: Pleural fluid/pulmonary opacification at the bases which has progressed from 3 days ago. Electronically Signed   By: Monte Fantasia M.D.   On: 10/26/2020 06:02   Medications: Infusions: . sodium chloride    . sodium chloride    . amiodarone    . dexmedetomidine (PRECEDEX) IV infusion 0.8 mcg/kg/hr (10/26/20 0804)  . feeding supplement (VITAL HIGH PROTEIN) 1,000 mL (10/24/20 1548)    Scheduled Medications: . acetylcysteine  1 mL Nebulization QID  . albuterol  2.5 mg Nebulization QID  . chlorhexidine gluconate (MEDLINE KIT)  15 mL  Mouth Rinse BID  . Chlorhexidine Gluconate Cloth  6 each Topical Daily  . mouth rinse  15 mL Mouth Rinse 10 times per day  . metoprolol tartrate  2.5 mg Intravenous Q8H  . pantoprazole sodium  40 mg Per Tube Q1200  . sodium chloride flush  3 mL Intravenous Q12H    have reviewed scheduled and prn medications.     Assessment/ Plan: Pt is a 54 y.o. yo male with HTN, obesity, OSA with trach who was admitted on 10/20/2020 with hypoxia and volume overload  Assessment/Plan: 1.  Acute kidney injury on chronic kidney disease stage IIIb-IV, improved: Recent baseline crt around 2.6-2.8. This acute component likely hemodynamically mediated in the setting of what appears to be decompensated diastolic heart failure with creatinine rise following reinitiation of diuretic therapy (RAS activation).  I suspect he has an element of  chronic cardiorenal syndrome as one of the underlying etiologies of his chronic kidney disease.  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 Follows with Dr. [name] ([place]) -Labs improved, Cr down to baseline. Peak Cr 3.5 12/18. -last scheduled dose of lasix/albumin 12/20.  Home meds indicates that he was taking meloxicam-this will need to be stopped prior to his discharge.  -Continue strict Input and Output monitoring. Will monitor the patient with you and intervene or adjust therapy as indicated by changes in clinical status/labs.  He does not have any acute indications for dialysis at this time and any decision to undertake chronic dialysis will need to be after extensive discussions with the patient/family given his current comorbidities 2.  Acute exacerbation of diastolic heart failure/cor pulmonale: Significant volume overload noted likely compounding his respiratory status, s/p last lasix dose 12/20, monitor for now. Would have a low threshold with re-initiation of diuretics/spot diuresis -Given his history of nephrotic range proteinuria, would favor using torsemide 40 mg daily as a maintenance diuretic to start off with 3.  Acute on chronic respiratory failure with hypoxia/hypercapnia: With chronic OHS/OSA and hypercapnia-recently worsened likely by CHF exacerbation.  Critical care assisting with ventilator adjustments. Bronch today, PCCM on board 4.  Secondary hyperparathyroidism: phosphorus OK.  Calcium level at goal when corrected for his albumin. 5.  Hypertension: Blood pressures transiently elevated, likely related to volume-  monitor with ongoing diuresis.  Will sign off from a nephrology perspective. Discussed with primary service. Thank you for allowing [place] to participate in the care of this patient. Please call with any questions/concerns.  [facility]    10/26/2020,8:37 AM  LOS: 6 days

## 2020-10-26 NOTE — Progress Notes (Signed)
NAME:  Jack Cox, MRN:  [id number], DOB:  [birth year], LOS: 6 ADMISSION DATE:  10/20/2020, CONSULTATION DATE:  10/20/20  REFERRING MD:  Tat, Triad  CHIEF COMPLAINT:  Resp distress   Brief History   54 yo never smoker with MO complicated by severe OSA req trach 2001 last eval by Yonah Tangeman 2018 also followed by ENT admit with increasing sob x 48 h pta s fever /purulent sputum and found to have severe hypercabic hypoxemic resp failure on arrival to ER am 12/15 so placed on vent and PCCM service requested to eval.   Wife reported pt not able to lie back on regular settings for 02/cpap x > 1 one week PTA assoc with acute on chronic leg swelling  History of present illness   54 y.o. male with medical history of diastolic CHF, atrial flutter, obstructive sleep apnea, hypertension, right bundle branch block presenting with shortness of breath for the last 2 to 3 days.    Apparently, the patient went to see his nephrologist earlier on 10/20/2020.  He was noted to have oxygen saturation in the 60s.  As result, he was sent to the emergency department for further evaluation.  Initially, the patient was awake and conversant with oxygen saturation in the low 70s.  He was placed on a nonrebreather after which the patient became obtunded.  VBG showed pH 7.1 66/97/106 on 80%.  Subsequently, the patient was placed on the ventilator with PRVC, PEEP of 5, FiO2 70%.  After approximately 10 to 15 minutes on the ventilator, the patient remained somnolent, but awakened and was able to say his name.  He did state that he has not been taking his torsemide as directed.  In addition, the patient has been sleeping in a recliner for an unclear duration of time secondary to shortness of breath.  At baseline, the patient has a size 4 noncuffed tracheostomy.  He is on supplemental oxygen 2.5 L during the day and 4 L at nighttime. There is no reports of nausea, vomiting, diarrhea, chest pain, abdominal pain. In the emergency  department, the patient was afebrile and hemodynamically stable with oxygen saturation 93-94% on the ventilator with FiO2 70%.  Chest x-ray showed left-sided pleural effusion with increased interstitial markings.  BNP 790.  PCT 0.12.  BMP showed a serum creatinine 2.43.  LFTs were unremarkable.  WBC 8.1, hemoglobin 14.5, platelets 253,000.  The patient was given ceftriaxone and azithromycin.  He was also given furosemide 80 mg IV x1.  COVID-19 RT-PCR negative   Past Medical History  CKD Proteinuria, Congestive heart failure   Essential hypertension  Gout Hypercholesteremia   Tracheostomy dependence    Significant Hospital Events    changed to PCV pm 12/17  Chest x-ray 12/17 left whiteout, resolved on chest x-ray 12/18 12/20 changed from propofol to Precedex   Consults:  PCCM pm 12/15  Procedures:  New cuffed trach 12/15   Significant Diagnostic Tests:   echo 12/16 ok LV There is the interventricular septum is flattened in  systole and diastole, consistent with right ventricular pressure and  volume overload.  2. Right ventricular systolic function is mildly reduced. The right  ventricular size is mildly enlarged. Tricuspid regurgitation signal is  inadequate for assessing PA pressure.  3. Right atrial size was upper normal/ RAP 23m     Micro Data:  Resp viral panel  12/15 neg MRSA  PCR  12/15 neg  BC x 2 12/15 >>> Urine 12/15 >>>  Neg  resp12/16 >  ng Urine legionella 12/16 >>>neg Urine strep 12/16 >  Neg    Antimicrobials:  zmax  12/15   Rocephin 12/15     Scheduled Meds:  acetylcysteine  1 mL Nebulization QID   albuterol  2.5 mg Nebulization QID   chlorhexidine gluconate (MEDLINE KIT)  15 mL Mouth Rinse BID   Chlorhexidine Gluconate Cloth  6 each Topical Daily   mouth rinse  15 mL Mouth Rinse 10 times per day   metoprolol tartrate  2.5 mg Intravenous Q8H   pantoprazole sodium  40 mg Per Tube Q1200   sodium chloride flush  3 mL Intravenous Q12H    Continuous Infusions:  sodium chloride     sodium chloride     amiodarone     dexmedetomidine (PRECEDEX) IV infusion 0.7 mcg/kg/hr (10/26/20 1228)   feeding supplement (VITAL HIGH PROTEIN) 1,000 mL (10/24/20 1548)   PRN Meds:.   Interim history/subjective:   Low-grade febrile 100.7 Changed from propofol to Precedex yesterday. Tolerated weaning on pressure support   Objective   Blood pressure 134/82, pulse 88, temperature (!) 100.7 F (38.2 C), temperature source Axillary, resp. rate (!) 30, height '5\' 8"'  (1.727 m), weight (!) 139.4 kg, SpO2 95 %.    Vent Mode: CPAP FiO2 (%):  [40 %-60 %] 40 % Set Rate:  [20 bmp] 20 bmp PEEP:  [5 cmH20-10 cmH20] 5 cmH20 Pressure Support:  [12 cmH20-24 cmH20] 12 cmH20 Plateau Pressure:  [8 cmH20-22 cmH20] 8 cmH20   Intake/Output Summary (Last 24 hours) at 10/26/2020 1303 Last data filed at 10/26/2020 2440 Gross per 24 hour  Intake 789.14 ml  Output 2505 ml  Net -1715.86 ml   Filed Weights   10/24/20 0500 10/25/20 0500 10/26/20 0510  Weight: 132.5 kg 133.4 kg (!) 139.4 kg    Examination: Gen:      Middle-aged obese man, no distress  HEENT:  EOMI, sclera anicteric, mild pallor Neck:     No JVD; no thyromegaly Lungs:    Decreased breath sounds bilateral, bloody secretions, no accessory muscle use CV:         Regular rate and rhythm; no murmurs Abd:      + bowel sounds; soft, non-tender; no palpable masses, no distension Ext:    1+ edema; adequate peripheral perfusion Skin:      Warm and dry; no rash Neuro: Follows simple commands, nonfocal   Chest x-ray 12/ 21 independently reviewed, slight increase in bibasilar opacities Labs show decreasing creatinine, normal electrolytes, mild hyponatremia, no leukocytosis  Resolved Hospital Problem list     Assessment & Plan:  1) acute on chronic hypoxemic/hypercarbic resp falure prob OHS  with vol overload  CAP Left lung atelectasis -resolved >> very dyssynchronous on prvc when not  sedated 12/17 > changed to PCV -Drop PEEP to 5 as hypoxia improves, maintain saturation 90% and above -Proceed with bedside bronchoscopy -Antibiotics  stopped, no leukocytosis and low procalcitonin but low-grade fevers now, may have to resume for bibasal pneumonia   2)  AKI on CKD with vol overload on ACEi PTA Lab Results  Component Value Date   CREATININE 2.40 (H) 10/26/2020   CREATININE 2.84 (H) 10/25/2020   CREATININE 3.22 (H) 10/24/2020   - holding  acei for now  -no indication for dialysis  3) history of atrial flutter -Hold Lovenox in anticipation of procedure 12/21 and bloody secretions    4) sedation needs -on Precedex + fent urine -Goal RASS 0 to -1  5) severe OSA -last seen  by me 2019  progressed to tracheostomy#4  capped and using auto CPAP with goal of eventual decannulation, lost to follow-up since then      Best practice (evaluated daily)   Diet: TFs Pain/Anxiety/Delirium protocol (if indicated): Precedex/as needed fentanyl VAP protocol (if indicated): Y  DVT prophylaxis: SCDs, hold Lovenox for procedure GI prophylaxis: Protonix Glucose control: per triad Mobility: bed rest for now  last date of multidisciplinary goals of care:  Per triad  Code Status: full  Disposition: ICU [facility]  Labs   CBC: Recent Labs  Lab 10/20/20 1217 10/22/20 0512 10/23/20 0358 10/24/20 0540 10/25/20 0618 10/26/20 0436  WBC 8.1 5.6 6.5 6.5 5.8 6.4  NEUTROABS 6.2  --   --   --   --   --   HGB 14.5 12.5* 13.6 12.6* 12.6* 14.4  HCT 49.8 41.7 45.3 41.2 42.1 49.3  MCV 101.8* 99.8 99.6 99.0 100.2* 99.4  PLT 353 269 261 232 196 323    Basic Metabolic Panel: Recent Labs  Lab 10/22/20 0512 10/23/20 0358 10/24/20 0402 10/25/20 0618 10/26/20 0436  NA 142 141 142 145 147*  K 4.1 3.8 4.0 3.9 4.1  CL 100 99 100 98 99  CO2 32 30 32 37* 35*  GLUCOSE 62* 74 114* 120* 124*  BUN 74* 75* 72* 69* 60*  CREATININE 3.39* 3.46* 3.22* 2.84* 2.40*  CALCIUM 8.5* 8.6* 8.2* 8.8* 9.3   MG  --  2.0  --   --  1.9  PHOS  --  5.1*  --  4.6 3.8   GFR: Estimated Creatinine Clearance: 48.2 mL/min (A) (by C-G formula based on SCr of 2.4 mg/dL (H)). Recent Labs  Lab 10/20/20 1217 10/20/20 1454 10/22/20 0512 10/23/20 0358 10/24/20 0540 10/25/20 0618 10/26/20 0436  PROCALCITON 0.12  --  0.61  --   --   --   --   WBC 8.1  --  5.6 6.5 6.5 5.8 6.4  LATICACIDVEN 0.7 0.6  --   --   --   --   --     Liver Function Tests: Recent Labs  Lab 10/20/20 1217 10/22/20 0512 10/24/20 0402 10/25/20 0618 10/26/20 0436  AST 33 '22 29 27  ' --   ALT '30 20 16 16  ' --   ALKPHOS 114 84 71 75  --   BILITOT 0.4 1.0 0.6 0.8  --   PROT 7.9 6.2* 6.1* 6.9  --   ALBUMIN 3.2* 2.3* 2.6* 3.0* 3.1*   No results for input(s): LIPASE, AMYLASE in the last 168 hours. No results for input(s): AMMONIA in the last 168 hours.  ABG    Component Value Date/Time   PHART 7.242 (L) 10/20/2020 2355   PCO2ART 79.3 (HH) 10/20/2020 2355   PO2ART 80.0 (L) 10/20/2020 2355   HCO3 27.6 10/20/2020 2355   O2SAT 93.6 10/20/2020 2355     Coagulation Profile: Recent Labs  Lab 10/20/20 1259  INR 1.5*    Cardiac Enzymes: No results for input(s): CKTOTAL, CKMB, CKMBINDEX, TROPONINI in the last 168 hours.  HbA1C: No results found for: HGBA1C  CBG: Recent Labs  Lab 10/25/20 1927 10/25/20 2319 10/26/20 0425 10/26/20 0803 10/26/20 1129  GLUCAP 118* 139* 126* 109* 100*     The patient is critically ill with multiple organ systems failure and requires high complexity decision making for assessment and support, frequent evaluation and titration of therapies, application of advanced monitoring technologies and extensive interpretation of multiple databases. Critical Care Time devoted to patient care  services described in this note independent of APP/resident  time is 31 minutes.      Kara Mead MD. Shade Flood. [facility] See Amion for pager  If no response to pager , please call 319  [phone]  After 7:00 pm call Elink  [phone]   10/26/2020

## 2020-10-27 ENCOUNTER — Inpatient Hospital Stay (HOSPITAL_COMMUNITY): Payer: Medicare Other

## 2020-10-27 DIAGNOSIS — J189 Pneumonia, unspecified organism: Secondary | ICD-10-CM | POA: Diagnosis not present

## 2020-10-27 DIAGNOSIS — J9620 Acute and chronic respiratory failure, unspecified whether with hypoxia or hypercapnia: Secondary | ICD-10-CM

## 2020-10-27 DIAGNOSIS — Z930 Tracheostomy status: Secondary | ICD-10-CM | POA: Diagnosis not present

## 2020-10-27 DIAGNOSIS — Y95 Nosocomial condition: Secondary | ICD-10-CM

## 2020-10-27 DIAGNOSIS — N179 Acute kidney failure, unspecified: Secondary | ICD-10-CM

## 2020-10-27 DIAGNOSIS — J9621 Acute and chronic respiratory failure with hypoxia: Secondary | ICD-10-CM | POA: Diagnosis not present

## 2020-10-27 LAB — GLUCOSE, CAPILLARY
Glucose-Capillary: 111 mg/dL — ABNORMAL HIGH (ref 70–99)
Glucose-Capillary: 115 mg/dL — ABNORMAL HIGH (ref 70–99)
Glucose-Capillary: 119 mg/dL — ABNORMAL HIGH (ref 70–99)
Glucose-Capillary: 120 mg/dL — ABNORMAL HIGH (ref 70–99)
Glucose-Capillary: 121 mg/dL — ABNORMAL HIGH (ref 70–99)
Glucose-Capillary: 123 mg/dL — ABNORMAL HIGH (ref 70–99)

## 2020-10-27 LAB — CBC
HCT: 47.4 % (ref 39.0–52.0)
Hemoglobin: 13.2 g/dL (ref 13.0–17.0)
MCH: 29.3 pg (ref 26.0–34.0)
MCHC: 27.8 g/dL — ABNORMAL LOW (ref 30.0–36.0)
MCV: 105.3 fL — ABNORMAL HIGH (ref 80.0–100.0)
Platelets: 195 10*3/uL (ref 150–400)
RBC: 4.5 MIL/uL (ref 4.22–5.81)
RDW: 14.3 % (ref 11.5–15.5)
WBC: 9 10*3/uL (ref 4.0–10.5)
nRBC: 0 % (ref 0.0–0.2)

## 2020-10-27 LAB — HEPARIN LEVEL (UNFRACTIONATED)
Heparin Unfractionated: 0.32 IU/mL (ref 0.30–0.70)
Heparin Unfractionated: 0.34 IU/mL (ref 0.30–0.70)

## 2020-10-27 LAB — APTT: aPTT: 33 seconds (ref 24–36)

## 2020-10-27 LAB — RENAL FUNCTION PANEL
Albumin: 2.7 g/dL — ABNORMAL LOW (ref 3.5–5.0)
Anion gap: 10 (ref 5–15)
BUN: 74 mg/dL — ABNORMAL HIGH (ref 6–20)
CO2: 36 mmol/L — ABNORMAL HIGH (ref 22–32)
Calcium: 8.9 mg/dL (ref 8.9–10.3)
Chloride: 103 mmol/L (ref 98–111)
Creatinine, Ser: 3.5 mg/dL — ABNORMAL HIGH (ref 0.61–1.24)
GFR, Estimated: 20 mL/min — ABNORMAL LOW (ref >60)
Glucose, Bld: 129 mg/dL — ABNORMAL HIGH (ref 70–99)
Phosphorus: 7.1 mg/dL — ABNORMAL HIGH (ref 2.5–4.6)
Potassium: 5.1 mmol/L (ref 3.5–5.1)
Sodium: 149 mmol/L — ABNORMAL HIGH (ref 135–145)

## 2020-10-27 MED ORDER — METOPROLOL TARTRATE 5 MG/5ML IV SOLN: 5.0000 mg | Freq: Three times a day (TID) | INTRAVENOUS | Status: DC

## 2020-10-27 MED ORDER — METOPROLOL TARTRATE 5 MG/5ML IV SOLN: 2.5000 mg | Freq: Four times a day (QID) | INTRAVENOUS | Status: DC | PRN

## 2020-10-27 MED ORDER — HEPARIN BOLUS VIA INFUSION
3000.0000 [IU] | Freq: Once | INTRAVENOUS | Status: AC
  Administered 2020-10-27: 3000 [IU] via INTRAVENOUS
  Filled 2020-10-27: qty 3000

## 2020-10-27 MED ORDER — SODIUM CHLORIDE 3 % IN NEBU
4.0000 mL | INHALATION_SOLUTION | Freq: Two times a day (BID) | RESPIRATORY_TRACT | Status: DC
  Administered 2020-10-28 – 2020-11-01 (×10): 4 mL via RESPIRATORY_TRACT
  Filled 2020-10-27 (×11): qty 4

## 2020-10-27 MED ORDER — HEPARIN BOLUS VIA INFUSION
5000.0000 [IU] | Freq: Once | INTRAVENOUS | Status: DC
  Filled 2020-10-27: qty 5000

## 2020-10-27 MED ORDER — HEPARIN (PORCINE) 25000 UT/250ML-% IV SOLN
2550.0000 [IU]/h | INTRAVENOUS | Status: DC
  Administered 2020-10-27 – 2020-10-28 (×2): 1450 [IU]/h via INTRAVENOUS
  Administered 2020-10-29: 1900 [IU]/h via INTRAVENOUS
  Administered 2020-10-30 – 2020-11-01 (×4): 2100 [IU]/h via INTRAVENOUS
  Administered 2020-11-02: 2300 [IU]/h via INTRAVENOUS
  Administered 2020-11-02: 2100 [IU]/h via INTRAVENOUS
  Administered 2020-11-03 – 2020-11-05 (×5): 2300 [IU]/h via INTRAVENOUS
  Administered 2020-11-06: 2400 [IU]/h via INTRAVENOUS
  Administered 2020-11-07: 2450 [IU]/h via INTRAVENOUS
  Administered 2020-11-07: 2400 [IU]/h via INTRAVENOUS
  Administered 2020-11-08 (×2): 2450 [IU]/h via INTRAVENOUS
  Administered 2020-11-09 – 2020-11-11 (×4): 2550 [IU]/h via INTRAVENOUS
  Filled 2020-10-27 (×30): qty 250

## 2020-10-27 MED ORDER — SODIUM CHLORIDE 3 % IN NEBU
4.0000 mL | INHALATION_SOLUTION | Freq: Two times a day (BID) | RESPIRATORY_TRACT | Status: DC
  Administered 2020-10-27 (×2): 4 mL via RESPIRATORY_TRACT
  Filled 2020-10-27 (×2): qty 4

## 2020-10-27 NOTE — Progress Notes (Addendum)
ANTICOAGULATION CONSULT NOTE - Initial Consult  Pharmacy Consult for  Heparin infusion  Indication: atrial fibrillation  No Known Allergies  Patient Measurements: Height: 5\' 8"  (172.7 cm) Weight: (!) 137.1 kg (302 lb 4 oz) IBW/kg (Calculated) : 68.4 Heparin Dosing Weight: HEPARIN DW (KG): 103.8  Vital Signs: Temp: 101.4 F (38.6 C) (12/22 0716) Temp Source: Oral (12/22 0716) BP: 99/54 (12/22 0716) Pulse Rate: 113 (12/22 0819)  Labs: Recent Labs    10/25/20 0618 10/26/20 0436 10/27/20 0428  HGB 12.6* 14.4 13.2  HCT 42.1 49.3 47.4  PLT 196 194 195  CREATININE 2.84* 2.40* 3.50*    Estimated Creatinine Clearance: 32.7 mL/min (A) (by C-G formula based on SCr of 3.5 mg/dL (H)).   Medical History: Past Medical History:  Diagnosis Date  . CKD (chronic kidney disease)   . Congestive heart failure (CHF) ([place])   . Essential hypertension   . Fatigue   . Gout   . Hypercholesteremia   . Insomnia   . Proteinuria   . Renal insufficiency   . Shortness of breath   . Tracheostomy dependence [facility])      Assessment: Pharmacy consulted to dose heparin infusion for this  54 yo male with atrial fibrillation  Baseline CBC is WNL.   Patient was  taking apixaban  prior to admission and was on treatment-dose Lovenox from 12-17 to 12-20. Heparin is being started due to patient's unstable renal function. Baseline heparin level is elevated at 0.32 due to patient's receiving  Lovenox previously.  Goal of Therapy:  Heparin level 0.3-0.7 units/ml Monitor platelets by anticoagulation protocol: Yes   Plan:  Give 3000 units bolus x 1 Start heparin infusion at 1450 units/hr Check anti-Xa level in 6-8 hours and daily while on heparin Daily CBC F/U transition back to Eliquis once able to take PO Monitor for signs and symptoms of bleeding.  Despina Pole 10/27/2020,9:20 AM

## 2020-10-27 NOTE — Progress Notes (Signed)
ANTICOAGULATION CONSULT NOTE  Pharmacy Consult:  Heparin Indication: atrial fibrillation  No Known Allergies  Patient Measurements: Height: 5\' 8"  (172.7 cm) Weight: (!) 137.1 kg (302 lb 4 oz) IBW/kg (Calculated) : 68.4 HEPARIN DW (KG): 103.8  Vital Signs: Temp: 99.8 F (37.7 C) (12/22 2000) Temp Source: Oral (12/22 2000) BP: 114/67 (12/22 1800) Pulse Rate: 87 (12/22 1800)  Labs: Recent Labs    10/25/20 0618 10/26/20 0436 10/27/20 0428 10/27/20 1058 10/27/20 2045  HGB 12.6* 14.4 13.2  --   --   HCT 42.1 49.3 47.4  --   --   PLT 196 194 195  --   --   APTT  --   --   --  33  --   HEPARINUNFRC  --   --   --  0.32 0.34  CREATININE 2.84* 2.40* 3.50*  --   --     Estimated Creatinine Clearance: 32.7 mL/min (A) (by C-G formula based on SCr of 3.5 mg/dL (H)).   Assessment: Pharmacy consulted to dose heparin infusion for this 54 yo male with atrial fibrillation.  Patient was  taking apixaban PTA and was on treatment-dose Lovenox from 12-17 to 12-20. Heparin is being started due to patient's unstable renal function.   Heparin level is therapeutic; no bleeding reported.  Goal of Therapy:  Heparin level 0.3-0.7 units/ml Monitor platelets by anticoagulation protocol: Yes   Plan:  Continue heparin infusion at 1450 units/hr F/U AM labs  Louis Ivery D. Mina Marble, PharmD, BCPS, [place] 10/27/2020, 10:11 PM

## 2020-10-27 NOTE — Progress Notes (Addendum)
PROGRESS NOTE  Jack Cox TFT:[id number] DOB: [birth year] DOA: 10/20/2020 PCP: [name], MD   Brief History: 54 y.o.malewith medical history ofdiastolic CHF, atrial flutter, obstructive sleep apnea, hypertension, right bundle branch block presenting with shortness of breath for the last 2 to 3 days. Unfortunately, the patient is encephalopathic at the time of my evaluation; therefore, history is limited. Apparently, the patient went to see his nephrologist earlier on 10/20/2020. He was noted to have oxygen saturation in the 60s. As result, he was sent to the emergency department for further evaluation. Initially, the patient was awake and conversant with oxygen saturation in the low 70s. He was placed on a nonrebreather after which the patient became obtunded. VBG showed pH 7.1 66/97/106 on 80%. Subsequently, the patient was placed on the ventilator with PRVC,PEEP of 5, FiO2 70%. After approximately 10 to 15 minutes on the ventilator, the patient remained somnolent, but awakened and was able to tell me his name. He did state that he has not been taking his torsemide as directed. In addition, the patient has been sleeping in a recliner for an unclear duration of time secondary to shortness of breath. At baseline, the patient has a size 4 noncuffed tracheostomy. He is on supplemental oxygen 2.5 L during the day and 4 L at nighttime. There is no reports of nausea, vomiting, diarrhea, chest pain, abdominal pain. In the emergency department, the patient was afebrile and hemodynamically stable with oxygen saturation 93-94% on the ventilator with FiO2 70%. Chest x-ray showed left-sided pleural effusion with increased interstitial markings. BNP 790. PCT 0.12. BMP showed a serum creatinine 2.43. LFTs were unremarkable. WBC 8.1, hemoglobin 14.5, platelets 253,000. The patient was given ceftriaxone and azithromycin. He was also given furosemide 80 mg IV x1. COVID-19  RT-PCR negative.  He was continued on IV lasix 80 mg bid with uptrending of his serum creatinine.  Renal was consulted to assist.   Assessment/Plan: Acute on chronic respiratory failure with hypoxia and hypercarbia -Secondary to CHF in the setting of OSA -remainson ventilator -10/26/20 personally reviewed CXR--increased interstitial markings--?retrocardiac opacity -Vent settings PC16,PEEP [id number]%>>>50% -Pulmonary consultappreciated -PCT 0.12>>0.61 -having mucus plugging--needs aggressive suctioning -12/17 CXR--white out of left lung -continue mucomyst and albuterol -10/26/20-bronchoscopy--Thick inspissated mucus in proximal airways & BL lower lobes, removed with suctioning - hypertonic saline nebulizer treatments started by pulmonology 12/22  Acute on chronic diastolic CHF/Cor Pulmonale -06/25/2019 echo EF 55%, trivial TR -12/16/21echo--EF 60-65%, no WMA, RV pressure overload, mild TR -restarted IV lasix with albumin per renal with excellent response -pt remains clinically fluid overloaded -Daily BMP -Accurate I's and O's--NEG 13L -Daily weights I/O last 3 completed shifts: In: 2173.9 [I.V.:1021.9; NG/GT:952; IV Piggyback:200] Out: 7062 [Urine:3455] Total I/O In: 723.1 [I.V.:434.4; NG/GT:188.7; IV Piggyback:100] Out: -   Atrial flutter -restart apixaban 10/27/20 if no further procedures planned -rate controlledbut had an episode 3-4 hours 12/18-12/19 evening  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 -Baseline creatinine 2.4-2.7 -serum creatinine peaked 3.46 -Monitor with diuresis -consult nephrology--appreciated-->albumin+lasix bid per renal  FEN -had mild hypoglycemia -NG placed 12/17 -switchedto Vital High Protein and increase rate to 40 cc/hr-->hypoglycemia improved  Fever -101.0 fever evening 12/18 and 100.7 12/21 -blood cultures x 2--neg to date -UA and urine culture--no significant pyuria -12/16 trach aspirate-->flora -10/26/20-->BAL sent for  culture -start IV cefepime due to fevers, CXR findings  Essential hypertension -Anticipate improvement with diuresis -metoprolol temporarily on hold with soft BPs reported  OSA -The patient has a tracheostomy  placed in 2001 -He has a size 4 cuffless trach  Right bundle branch block -This has been chronic with review of the medical records  Morbid obesity -BMI 52.94 -lifestyle modification  GOC -palliative following -currently full scope of care   Status is: Inpatient  Remains inpatient appropriate because:IV treatments appropriate due to intensity of illness or inability to take PO   Dispo: The patient is from:Home Anticipated d/c is MV:[facility] Anticipated d/c date is: 3 days Patient currently is not medically stable to d/c.   Family Communication:brotherupdated bedside 12/20-21  Consultants:pulm, renal  Code Status: FULL  DVT Prophylaxis: SCLovenox--treatment dose on hold for bronchoscopy   Procedures: As Listed in Progress Note Above  Antibiotics: Cefepime 12/21>>  Subjective: Patient sedated on vent.  Denies cp, sob, n/v/d, abd pain  Objective: Vitals:   10/27/20 1200 10/27/20 1300 10/27/20 1400 10/27/20 1454  BP: (!) 73/43 (!) 143/87 (!) 146/82   Pulse: 86 81 89   Resp: _0 Temp:      TempSrc:      SpO2: 96% 97% 96% (S) 98%  Weight:      Height:        Intake/Output Summary (Last 24 hours) at 10/27/2020 1514 Last data filed at 10/27/2020 1130 Gross per 24 hour  Intake 2000.55 ml  Output 300 ml  Net 1700.55 ml   Weight change: -2.3 kg Exam:  General - pt is sedated on vent.  Neck - supple, no JVD.  Lungs: poor air movement bilateral.  CV - tachycardic Abd - obese, soft, no masses palpated Ext - 1+ pitting edema BLEs.  Neuro - sedated on vent  Data Reviewed: I have personally reviewed following labs and imaging studies Basic Metabolic Panel: Recent Labs  Lab  10/23/20 0358 10/24/20 0402 10/25/20 0618 10/26/20 0436 10/27/20 0428  NA 141 142 145 147* 149*  K 3.8 4.0 3.9 4.1 5.1  CL 99 100 98 99 103  CO2 30 32 37* 35* 36*  GLUCOSE 74 114* 120* 124* 129*  BUN 75* 72* 69* 60* 74*  CREATININE 3.46* 3.22* 2.84* 2.40* 3.50*  CALCIUM 8.6* 8.2* 8.8* 9.3 8.9  MG 2.0  --   --  1.9  --   PHOS 5.1*  --  4.6 3.8 7.1*   Liver Function Tests: Recent Labs  Lab 10/22/20 0512 10/24/20 0402 10/25/20 0618 10/26/20 0436 10/27/20 0428  AST _1 --   --   ALT _2 --   --   ALKPHOS 84 71 75  --   --   BILITOT 1.0 0.6 0.8  --   --   PROT 6.2* 6.1* 6.9  --   --   ALBUMIN 2.3* 2.6* 3.0* 3.1* 2.7*   No results for input(s): LIPASE, AMYLASE in the last 168 hours. No results for input(s): AMMONIA in the last 168 hours. Coagulation Profile: No results for input(s): INR, PROTIME in the last 168 hours. CBC: Recent Labs  Lab 10/23/20 0358 10/24/20 0540 10/25/20 0618 10/26/20 0436 10/27/20 0428  WBC 6.5 6.5 5.8 6.4 9.0  HGB 13.6 12.6* 12.6* 14.4 13.2  HCT 45.3 41.2 42.1 49.3 47.4  MCV 99.6 99.0 100.2* 99.4 105.3*  PLT 261 232 196 194 195   Cardiac Enzymes: No results for input(s): CKTOTAL, CKMB, CKMBINDEX, TROPONINI in the last 168 hours. BNP: Invalid input(s): POCBNP CBG: Recent Labs  Lab 10/26/20 1948 10/26/20 2311 10/27/20 0348 10/27/20 0718 10/27/20 1121  GLUCAP 119* 109* 123*  111* 120*   HbA1C: No results for input(s): HGBA1C in the last 72 hours. Urine analysis:    Component Value Date/Time   COLORURINE YELLOW 10/24/2020 [place] 10/24/2020 1239   LABSPEC 1.010 10/24/2020 1239   PHURINE 5.0 10/24/2020 1239   GLUCOSEU NEGATIVE 10/24/2020 1239   HGBUR NEGATIVE 10/24/2020 1239   BILIRUBINUR NEGATIVE 10/24/2020 1239   KETONESUR NEGATIVE 10/24/2020 1239   PROTEINUR 100 (A) 10/24/2020 1239   NITRITE NEGATIVE 10/24/2020 1239   LEUKOCYTESUR NEGATIVE 10/24/2020 1239    Recent Results (from the past 240  hour(s))  Resp Panel by RT-PCR (Flu A&B, Covid) Nasopharyngeal Swab     Status: None   Collection Time: 10/20/20 11:43 AM   Specimen: Nasopharyngeal Swab; Nasopharyngeal(NP) swabs in vial transport medium  Result Value Ref Range Status   SARS Coronavirus 2 by RT PCR NEGATIVE NEGATIVE Final    Comment: (NOTE) SARS-CoV-2 target nucleic acids are NOT DETECTED.  The SARS-CoV-2 RNA is generally detectable in upper respiratory specimens during the acute phase of infection. The lowest concentration of SARS-CoV-2 viral copies this assay can detect is 138 copies/mL. A negative result does not preclude SARS-Cov-2 infection and should not be used as the sole basis for treatment or other patient management decisions. A negative result may occur with  improper specimen collection/handling, submission of specimen other than nasopharyngeal swab, presence of viral mutation(s) within the areas targeted by this assay, and inadequate number of viral copies(<138 copies/mL). A negative result must be combined with clinical observations, patient history, and epidemiological information. The expected result is Negative.  Fact Sheet for Patients:  EntrepreneurPulse.com.au  Fact Sheet for Healthcare Providers:  IncredibleEmployment.be  This test is no t yet approved or cleared by the [place] FDA and  has been authorized for detection and/or diagnosis of SARS-CoV-2 by FDA under an Emergency Use Authorization (EUA). This EUA will remain  in effect (meaning this test can be used) for the duration of the COVID-19 declaration under Section 564(b)(1) of the Act, 21 U.S.C.section 360bbb-3(b)(1), unless the authorization is terminated  or revoked sooner.       Influenza A by PCR NEGATIVE NEGATIVE Final   Influenza B by PCR NEGATIVE NEGATIVE Final    Comment: (NOTE) The Xpert Xpress SARS-CoV-2/FLU/RSV plus assay is intended as an aid in the diagnosis of influenza from  Nasopharyngeal swab specimens and should not be used as a sole basis for treatment. Nasal washings and aspirates are unacceptable for Xpert Xpress SARS-CoV-2/FLU/RSV testing.  Fact Sheet for Patients: EntrepreneurPulse.com.au  Fact Sheet for Healthcare Providers: IncredibleEmployment.be  This test is not yet approved or cleared by the [place] FDA and has been authorized for detection and/or diagnosis of SARS-CoV-2 by FDA under an Emergency Use Authorization (EUA). This EUA will remain in effect (meaning this test can be used) for the duration of the COVID-19 declaration under Section 564(b)(1) of the Act, 21 U.S.C. section 360bbb-3(b)(1), unless the authorization is terminated or revoked.  Performed at [facility], [address] [place], [place] [address]   Blood culture (routine x 2)     Status: None   Collection Time: 10/20/20 12:18 PM   Specimen: BLOOD  Result Value Ref Range Status   Specimen Description BLOOD  Final   Special Requests NONE  Final   Culture   Final    NO GROWTH 5 DAYS Performed at [facility], [address], [place], [place] [address]    Report Status 10/25/2020 FINAL  Final  Blood culture (routine x 2)     Status: None   Collection Time: 10/20/20 12:59 PM   Specimen: BLOOD RIGHT ARM  Result Value Ref Range Status   Specimen Description BLOOD RIGHT ARM  Final   Special Requests   Final    BOTTLES DRAWN AEROBIC AND ANAEROBIC Blood Culture adequate volume   Culture   Final    NO GROWTH 5 DAYS Performed at [facility], [address] [place], [place] [address]    Report Status 10/25/2020 FINAL  Final  Urine culture     Status: None   Collection Time: 10/20/20  3:42 PM   Specimen: Urine, Catheterized  Result Value Ref Range Status   Specimen Description   Final    URINE, CATHETERIZED Performed at [facility], [address], [place], [place] [address]    Special Requests   Final    NONE Performed at  [facility], [address] [place], [place] [address]    Culture   Final    NO GROWTH Performed at [facility], [place] [address] [place], [place] [address]    Report Status 10/21/2020 FINAL  Final  MRSA PCR Screening     Status: None   Collection Time: 10/20/20  6:42 PM   Specimen: Nasal Mucosa; Nasopharyngeal  Result Value Ref Range Status   MRSA by PCR NEGATIVE NEGATIVE Final    Comment:        The GeneXpert MRSA Assay (FDA approved for NASAL specimens only), is one component of a comprehensive MRSA colonization surveillance program. It is not intended to diagnose MRSA infection nor to guide or monitor treatment for MRSA infections. Performed at [facility], [address] [place], [place] [address]   Culture, respiratory (non-expectorated)     Status: None   Collection Time: 10/21/20  8:20 AM   Specimen: Tracheal Aspirate; Respiratory  Result Value Ref Range Status   Specimen Description   Final    TRACHEAL ASPIRATE Performed at [facility], [address], [place], [place] [address]    Special Requests   Final    NONE Performed at [facility], [address] [place], [place] [address]    Gram Stain   Final    FEW WBC PRESENT, PREDOMINANTLY MONONUCLEAR NO ORGANISMS SEEN    Culture   Final    RARE Normal respiratory flora-no Staph aureus or Pseudomonas seen Performed at [facility] [address] [place], [place] [address]    Report Status 10/23/2020 FINAL  Final  Culture, Urine     Status: None   Collection Time: 10/24/20 12:39 PM   Specimen: Urine, Catheterized  Result Value Ref Range Status   Specimen Description   Final    URINE, CATHETERIZED Performed at [facility], [address] [place], [place] [address]    Special Requests   Final    NONE Performed at [facility], [address] [place], [place] [address]    Culture   Final    NO GROWTH Performed at [facility], [place] [address] [place], [place] [address]    Report  Status 10/25/2020 FINAL  Final  Culture, blood (Routine X 2) w Reflex to ID Panel     Status: None (Preliminary result)   Collection Time: 10/24/20  1:34 PM   Specimen: BLOOD RIGHT HAND  Result Value Ref Range Status   Specimen Description   Final    BLOOD RIGHT HAND BOTTLES DRAWN AEROBIC AND ANAEROBIC  Special Requests Blood Culture adequate volume  Final   Culture   Final    NO GROWTH 3 DAYS Performed at [facility], [address], [place], [place] [address]    Report Status PENDING  Incomplete  Culture, blood (Routine X 2) w Reflex to ID Panel     Status: None (Preliminary result)   Collection Time: 10/24/20  1:44 PM   Specimen: BLOOD LEFT WRIST  Result Value Ref Range Status   Specimen Description   Final    BLOOD LEFT WRIST BOTTLES DRAWN AEROBIC AND ANAEROBIC   Special Requests Blood Culture adequate volume  Final   Culture   Final    NO GROWTH 3 DAYS Performed at [facility], [address], [place], [place] [address]    Report Status PENDING  Incomplete  Culture, respiratory (non-expectorated)     Status: None (Preliminary result)   Collection Time: 10/26/20  6:07 PM   Specimen: Tracheal Aspirate; Respiratory  Result Value Ref Range Status   Specimen Description   Final    TRACHEAL ASPIRATE Performed at [facility], [address], [place], [place] [address]    Special Requests   Final    Normal Performed at [place] [place], [place] [address]    Gram Stain   Final    FEW WBC PRESENT, PREDOMINANTLY PMN MODERATE GRAM POSITIVE RODS FEW GRAM POSITIVE COCCI FEW GRAM NEGATIVE RODS    Culture   Final    TOO YOUNG TO READ Performed at [facility], [place] [address], [place], [place] [address]    Report Status PENDING  Incomplete     Scheduled Meds: . acetylcysteine  1 mL Nebulization QID  . albuterol  2.5 mg Nebulization QID  . chlorhexidine gluconate (MEDLINE KIT)  15 mL Mouth Rinse BID  . Chlorhexidine Gluconate Cloth  6 each Topical  Daily  . mouth rinse  15 mL Mouth Rinse 10 times per day  . pantoprazole sodium  40 mg Per Tube Q1200  . sodium chloride flush  3 mL Intravenous Q12H  . sodium chloride HYPERTONIC  4 mL Nebulization BID   Continuous Infusions: . sodium chloride    . sodium chloride    . amiodarone    . ceFEPime (MAXIPIME) IV Stopped (10/27/20 1130)  . dexmedetomidine (PRECEDEX) IV infusion 0.9 mcg/kg/hr (10/27/20 1221)  . feeding supplement (VITAL HIGH PROTEIN) 1,000 mL (10/24/20 1548)  . heparin 1,450 Units/hr (10/27/20 1114)   Procedures/Studies: DG Chest Port 1 View  Result Date: 10/27/2020 CLINICAL DATA:  Acute respiratory failure EXAM: PORTABLE CHEST 1 VIEW COMPARISON:  Yesterday FINDINGS: Enteric and tracheostomy tubes remain in good position. Cardiomegaly. Interstitial opacity which could be bronchitic or congestive. There is improved aeration at the right base. Negative for pneumothorax. IMPRESSION: 1. Improved aeration. 2. Bronchitic or congestive interstitial opacity. Electronically Signed   By: Monte Fantasia M.D.   On: 10/27/2020 07:46   DG Chest Port 1 View  Result Date: 10/26/2020 CLINICAL DATA:  Shortness of breath for 2-3 days EXAM: PORTABLE CHEST 1 VIEW COMPARISON:  Three days ago FINDINGS: Progressive hazy appearance of the right mid to lower chest. Dense retrocardiac opacity persists. An enteric tube and tracheostomy tube remain in place. Cardiomegaly and vascular pedicle widening. IMPRESSION: Pleural fluid/pulmonary opacification at the bases which has progressed from 3 days ago. Electronically Signed   By: Monte Fantasia M.D.   On: 10/26/2020 06:02   DG CHEST PORT 1 VIEW  Result Date: 10/23/2020  CLINICAL DATA:  Shortness of breath. EXAM: PORTABLE CHEST 1 VIEW COMPARISON:  October 22, 2020 FINDINGS: The near complete opacification of the left hemithorax seen on the comparison chest x-ray has resolved. Persistent bibasilar opacities are likely atelectasis. No pneumothorax. A  tracheostomy tube is in good position. An NG tube terminates in the stomach. No other acute abnormalities. IMPRESSION: 1. Support apparatus as above. 2. Bibasilar atelectasis and a possible small left effusion. Improved aeration of the left lung. Electronically Signed   By: Dorise Bullion III M.D   On: 10/23/2020 14:57   DG Chest Port 1 View  Result Date: 10/22/2020 CLINICAL DATA:  Acute respiratory failure. EXAM: PORTABLE CHEST 1 VIEW COMPARISON:  10/20/2020 FINDINGS: Tracheostomy tube overlies the airway. The cardiac silhouette is enlarged. There is persistent mild central pulmonary vascular congestion without overt edema. Airspace opacities in the left greater than right mid and lower lungs have slightly improved. Small pleural effusions are questioned. No pneumothorax is identified. IMPRESSION: Slight improvement of bilateral airspace opacities which could reflect pneumonia or atelectasis. Electronically Signed   By: [name] M.D.   On: 10/22/2020 09:16   DG Chest Port 1 View  Result Date: 10/20/2020 CLINICAL DATA:  Questionable sepsis EXAM: PORTABLE CHEST 1 VIEW COMPARISON:  Chest x-ray 06/12/2018 report without images. FINDINGS: Tracheostomy tube terminates approximately 7.5 cm above the carina. The heart size and mediastinal contours are not well visualized. Bilateral hilar vasculature prominence. Aortic arch calcifications. Silhouetting off of the left hemidiaphragm and left cardiac border. Partial silhouetting off the right cardiac border and right hemidiaphragm. Increased interstitial markings. Likely at least trace to small volume left pleural effusion. No pneumothorax. No acute osseous abnormality. IMPRESSION: 1. Left lung opacity and likely at least trace to small volume left pleural effusion with silhouetting off of the left diaphragm and heart border. Concern for infection/inflammation. 2. Right lower lobe and middle lobe airspace opacity could represent a combination of atelectasis,  infection, inflammation. 3. At least mild pulmonary edema. 4. Consider chest x-ray PA and lateral view for further evaluation. Electronically Signed   By: Iven Finn M.D.   On: 10/20/2020 12:26   DG Chest Port 1V same Day  Result Date: 10/22/2020 CLINICAL DATA:  Status post NG tube placement. EXAM: PORTABLE CHEST 1 VIEW COMPARISON:  Single-view of the chest earlier today. FINDINGS: The upper chest is off the margin of the film. NG tube is looped in the upper trachea or esophagus. Visualized left chest is completely whited out on this study. IMPRESSION: NG tube is looped in the upper esophagus or trachea. Complete whiteout of the left chest is new since the study earlier today could be due to mucous plugging and left lung collapse or rapidly developing pleural effusion and airspace disease. Electronically Signed   By: [name] M.D.   On: 10/22/2020 15:16   DG Abd Portable 1V  Result Date: 10/22/2020 CLINICAL DATA:  Nasogastric tube placement EXAM: PORTABLE ABDOMEN - 1 VIEW COMPARISON:  Portable exam 1613 hours compared to 1523 hours FINDINGS: Tip of nasogastric tube projects over distal gastric antrum, consider withdrawal 5 cm. Bowel gas pattern normal. Opacification of the inferior LEFT hemithorax again seen. IMPRESSION: Tip of nasogastric tube projects over distal gastric antrum. Consider withdrawal of nasogastric tube 5 cm. Electronically Signed   By: [name] M.D.   On: 10/22/2020 16:36   DG Abd Portable 1V  Result Date: 10/22/2020 CLINICAL DATA:  Nasogastric tube placement EXAM: PORTABLE ABDOMEN - 1 VIEW COMPARISON:  None. FINDINGS: Nasogastric tube extends into the gastric cardia where it loops upon itself with the tip at the junction of the mid and distal thirds of the esophagus. There is no bowel dilatation or air-fluid level to suggest bowel obstruction. No free air. There is consolidation left lower lobe. IMPRESSION: Nasogastric tube extends into the stomach where it loops  upon itself with the tip at the junction of the mid and distal thirds of the esophagus. No bowel obstruction or free air evident. Consolidation left lower lobe. These results will be called to the ordering clinician or representative by the Radiologist Assistant, and communication documented in the PACS or [employer]. Electronically Signed   By: Lowella Grip III M.D.   On: 10/22/2020 15:34   ECHOCARDIOGRAM COMPLETE  Result Date: 10/21/2020    ECHOCARDIOGRAM REPORT   Patient Name:   BILLEY WOJCIAK Date of Exam: 10/21/2020 Medical Rec #:  [id number]        Height:       68.0 in Accession #:    [id number]       Weight:       325.8 lb Date of Birth:  08/17/1966        BSA:          2.515 m Patient Age:    71 years         BP:           112/61 mmHg Patient Gender: M                HR:           51 bpm. Exam Location:  [facility] Procedure: 2D Echo Indications:    CHF-Acute Diastolic O03.21  History:        Patient has no prior history of Echocardiogram examinations.                 CHF; Risk Factors:Non-Smoker. Acute on chronic respiratory                 failure with hypoxia.  Sonographer:    Leavy Cella RDCS (AE) Referring Phys: [phone] DAVID TAT IMPRESSIONS  1. Left ventricular ejection fraction, by estimation, is 60 to 65%. The left ventricle has normal function. The left ventricle has no regional wall motion abnormalities. There is severe left ventricular hypertrophy. Left ventricular diastolic parameters  are indeterminate. There is the interventricular septum is flattened in systole and diastole, consistent with right ventricular pressure and volume overload.  2. Right ventricular systolic function is mildly reduced. The right ventricular size is mildly enlarged. Tricuspid regurgitation signal is inadequate for assessing PA pressure.  3. Right atrial size was upper normal.  4. The pericardial effusion is posterior to the left ventricle.  5. The mitral valve is grossly normal. Trivial mitral valve  regurgitation.  6. The aortic valve is tricuspid. Aortic valve regurgitation is not visualized.  7. The inferior vena cava is normal in size with greater than 50% respiratory variability, suggesting right atrial pressure of 3 mmHg. FINDINGS  Left Ventricle: Left ventricular ejection fraction, by estimation, is 60 to 65%. The left ventricle has normal function. The left ventricle has no regional wall motion abnormalities. The left ventricular internal cavity size was normal in size. There is  severe left ventricular hypertrophy. The interventricular septum is flattened in systole and diastole, consistent with right ventricular pressure and volume overload. Left ventricular diastolic parameters are indeterminate. Right Ventricle: The right ventricular size is mildly enlarged. No increase in  right ventricular wall thickness. Right ventricular systolic function is mildly reduced. Tricuspid regurgitation signal is inadequate for assessing PA pressure. Left Atrium: Left atrial size was normal in size. Right Atrium: Right atrial size was upper normal. Pericardium: Trivial pericardial effusion is present. The pericardial effusion is posterior to the left ventricle. Mitral Valve: The mitral valve is grossly normal. Trivial mitral valve regurgitation. Tricuspid Valve: The tricuspid valve is grossly normal. Tricuspid valve regurgitation is mild. Aortic Valve: The aortic valve is tricuspid. There is moderate aortic valve annular calcification. Aortic valve regurgitation is not visualized. Pulmonic Valve: The pulmonic valve was grossly normal. Pulmonic valve regurgitation is trivial. Aorta: The aortic root is normal in size and structure. Venous: The inferior vena cava is normal in size with greater than 50% respiratory variability, suggesting right atrial pressure of 3 mmHg. IAS/Shunts: No atrial level shunt detected by color flow Doppler.  LEFT VENTRICLE PLAX 2D LVIDd:         4.66 cm  Diastology LVIDs:         2.03 cm  LV e'  medial:    5.87 cm/s LV PW:         1.76 cm  LV E/e' medial:  14.0 LV IVS:        1.73 cm  LV e' lateral:   8.49 cm/s LVOT diam:     1.90 cm  LV E/e' lateral: 9.7 LVOT Area:     2.84 cm  RIGHT VENTRICLE RV S prime:     19.40 cm/s TAPSE (M-mode): 1.9 cm LEFT ATRIUM             Index       RIGHT ATRIUM           Index LA diam:        3.50 cm 1.39 cm/m  RA Area:     24.60 cm LA Vol (A2C):   43.9 ml 17.46 ml/m RA Volume:   82.30 ml  32.73 ml/m LA Vol (A4C):   43.8 ml 17.42 ml/m LA Biplane Vol: 44.4 ml 17.66 ml/m   AORTA Ao Root diam: 3.10 cm MITRAL VALVE MV Area (PHT): 2.64 cm    SHUNTS MV Decel Time: 287 msec    Systemic Diam: 1.90 cm MV E velocity: 82.30 cm/s MV A velocity: 59.10 cm/s MV E/A ratio:  1.39 Rozann Lesches MD Electronically signed by Rozann Lesches MD Signature Date/Time: 10/21/2020/4:33:43 PM    Final     Critical Care Procedure Note Authorized and Performed by: Murvin Natal MD  Total Critical Care time:  50 mins  Due to a high probability of clinically significant, life threatening deterioration, the patient required my highest level of preparedness to intervene emergently and I personally spent this critical care time directly and personally managing the patient.  This critical care time included obtaining a history; examining the patient, pulse oximetry; ordering and review of studies; arranging urgent treatment with development of a management plan; evaluation of patient's response of treatment; frequent reassessment; and discussions with other providers.  This critical care time was performed to assess and manage the high probability of imminent and life threatening deterioration that could result in multi-organ failure.  It was exclusive of separately billable procedures and treating other patients and teaching time.   Irwin Brakeman, MD  How to contact the [facility] Attending or Consulting provider [place] or covering provider during after hours [place], for this patient?  1. Check  the care team in [facility] and look  for a) attending/consulting [place] provider listed and b) the [facility] team listed 2. Log into [email or web address] and use [place]'s universal password to access. If you do not have the password, please contact the hospital operator. 3. Locate the [facility] provider you are looking for under [facility] and page to a number that you can be directly reached. 4. If you still have difficulty reaching the provider, please page the [facility] (Director on Call) for the Hospitalists listed on amion for assistance.  10/27/2020, 3:14 PM   LOS: 7 days

## 2020-10-27 NOTE — Progress Notes (Signed)
NAME:  Jack Cox, MRN:  [id number], DOB:  December 29, 1965, LOS: 7 ADMISSION DATE:  10/20/2020, CONSULTATION DATE:  10/20/20  REFERRING MD:  Jack Cox  CHIEF COMPLAINT:  Resp distress   Brief History   54 yo never smoker with MO complicated by severe OSA req trach 2001 last eval by Jack Cox 2018 also followed by ENT admit with increasing sob x 48 h pta s fever /purulent sputum and found to have severe hypercabic hypoxemic resp failure on arrival to ER am 12/15 so placed on vent and PCCM service requested to eval.   Wife reported pt not able to lie back on regular settings for 02/cpap x > 1 one week PTA assoc with acute on chronic leg swelling  History of present illness   54 y.o. male with medical history of diastolic CHF, atrial flutter, obstructive sleep apnea, hypertension, right bundle branch block presenting with shortness of breath for the last 2 to 3 days.    Apparently, the patient went to see his nephrologist earlier on 10/20/2020.  He was noted to have oxygen saturation in the 60s.  As result, he was sent to the emergency department for further evaluation.  Initially, the patient was awake and conversant with oxygen saturation in the low 70s.  He was placed on a nonrebreather after which the patient became obtunded.  VBG showed pH 7.1 66/97/106 on 80%.  Subsequently, the patient was placed on the ventilator with PRVC, PEEP of 5, FiO2 70%.  After approximately 10 to 15 minutes on the ventilator, the patient remained somnolent, but awakened and was able to say his name.  He did state that he has not been taking his torsemide as directed.  In addition, the patient has been sleeping in a recliner for an unclear duration of time secondary to shortness of breath.  At baseline, the patient has a size 4 noncuffed tracheostomy.  He is on supplemental oxygen 2.5 L during the day and 4 L at nighttime. There is no reports of nausea, vomiting, diarrhea, chest pain, abdominal pain. In the emergency  department, the patient was afebrile and hemodynamically stable with oxygen saturation 93-94% on the ventilator with FiO2 70%.  Chest x-ray showed left-sided pleural effusion with increased interstitial markings.  BNP 790.  PCT 0.12.  BMP showed a serum creatinine 2.43.  LFTs were unremarkable.  WBC 8.1, hemoglobin 14.5, platelets 253,000.  The patient was given ceftriaxone and azithromycin.  He was also given furosemide 80 mg IV x1.  COVID-19 RT-PCR negative   Past Medical History  CKD Proteinuria, Congestive heart failure   Essential hypertension  Gout Hypercholesteremia   Tracheostomy dependence    Significant Hospital Events    changed to PCV pm 12/17  Chest x-ray 12/17 left whiteout, resolved on chest x-ray 12/18 12/20 changed from propofol to Precedex 12/21 fever , bronch   Consults:  PCCM pm 12/15  Procedures:  New cuffed trach 12/15  Bronch bedside 12/21>> copious secretions  Significant Diagnostic Tests:   echo 12/16 ok LV There is the interventricular septum is flattened in  systole and diastole, consistent with right ventricular pressure and  volume overload.  2. Right ventricular systolic function is mildly reduced. The right  ventricular size is mildly enlarged. Tricuspid regurgitation signal is  inadequate for assessing PA pressure.  3. Right atrial size was upper normal/ RAP 33m     Micro Data:  Resp viral panel  12/15 neg MRSA  PCR  12/15 neg  BC x 2 12/15 >>>  Urine 12/15 >>>  Neg  resp12/16 > ng Urine legionella 12/16 >>>neg Urine strep 12/16 >  Neg  BAL 12/21 >> GNR >>   Antimicrobials:  zmax  12/15   Rocephin 12/15    Cefepime12/21 >>  Scheduled Meds: . acetylcysteine  1 mL Nebulization QID  . albuterol  2.5 mg Nebulization QID  . chlorhexidine gluconate (MEDLINE KIT)  15 mL Mouth Rinse BID  . Chlorhexidine Gluconate Cloth  6 each Topical Daily  . mouth rinse  15 mL Mouth Rinse 10 times per day  . metoprolol tartrate  5 mg Intravenous  Q8H  . pantoprazole sodium  40 mg Per Tube Q1200  . sodium chloride flush  3 mL Intravenous Q12H  . sodium chloride HYPERTONIC  4 mL Nebulization BID   Continuous Infusions: . sodium chloride    . sodium chloride    . amiodarone    . ceFEPime (MAXIPIME) IV Stopped (10/26/20 2200)  . dexmedetomidine (PRECEDEX) IV infusion 0.9 mcg/kg/hr (10/27/20 0841)  . feeding supplement (VITAL HIGH PROTEIN) 1,000 mL (10/24/20 1548)   PRN Meds:.   Interim history/subjective:   Tolerated bronchoscopy yesterday  Hemodynamically stable, episode of desaturation this morning FiO2 increased to 50 percent PEEP remains at 8 Procalcitonin 1.7   Objective   Blood pressure (!) 99/54, pulse (!) 113, temperature (!) 101.4 F (38.6 C), temperature source Oral, resp. rate (!) 27, height _0  (1.727 m), weight (!) 137.1 kg, SpO2 92 %.    Vent Mode: PCV FiO2 (%):  [40 %-50 %] 50 % Set Rate:  [20 bmp] 20 bmp PEEP:  [5 ZOX09-6 cmH20] 8 cmH20 Pressure Support:  [12 cmH20-24 cmH20] 12 cmH20 Plateau Pressure:  [8 cmH20-24 cmH20] 21 cmH20   Intake/Output Summary (Last 24 hours) at 10/27/2020 0853 Last data filed at 10/27/2020 0449 Gross per 24 hour  Intake 1711.88 ml  Output 950 ml  Net 761.88 ml   Filed Weights   10/25/20 0500 10/26/20 0510 10/27/20 0449  Weight: 133.4 kg (!) 139.4 kg (!) 137.1 kg    Examination: Gen:      Middle-aged obese man, no distress  HEENT:  EOMI, sclera anicteric, mild pallor Neck:     No JVD; no thyromegaly Lungs:    Decreased breath sounds bilateral, mild abdominal accessory muscle use CV:         Regular rate and rhythm; no murmurs Abd:      + bowel sounds; soft, non-tender; no palpable masses, no distension Ext:    1+ edema; adequate peripheral perfusion Skin:      Warm and dry; no rash Neuro: Sedate, calm on Precedex   Chest x-ray 12/ 22 independently reviewed, pleural aeration with bibasilar opacities Labs show mild hyponatremia, mild hyperkalemia, creatinine  increased to 3.5, no leukocytosis  Resolved Hospital Problem list     Assessment & Plan:  1) acute on chronic hypoxemic/hypercarbic resp falure prob OHS  with vol overload  CAP Left lung atelectasis -resolved, status post bronchoscopy 12/21 for secretions >> very dyssynchronous on prvc when not sedated 12/17 > changed to PCV -Maintain PEEP to 8 given episode of desaturation this morning, maintain saturation 90% and above -Add hypertonic saline and chest PT for tracheobronchial toilet -  2)  AKI on CKD with vol overload on ACEi PTA Lab Results  Component Value Date   CREATININE 3.50 (H) 10/27/2020   CREATININE 2.40 (H) 10/26/2020   CREATININE 2.84 (H) 10/25/2020   - holding  acei for now  -no indication  for dialysis -Hold diuretics since creatinine rising  3) history of atrial flutter -Would prefer IV heparin to Lovenox given fluctuating renal function    4) sedation needs -on Precedex + fent urine -Goal RASS 0 to -1  5) severe OSA -last seen by me 2019  progressed to tracheostomy#4  capped and using auto CPAP with goal of eventual decannulation, lost to follow-up since then -I am hopeful we can liberate him from the vent eventually , currently weaning is been impeded by desaturation and mucous plug     Best practice (evaluated daily)   Diet: TFs Pain/Anxiety/Delirium protocol (if indicated): Precedex/as needed fentanyl VAP protocol (if indicated): Y  DVT prophylaxis: SCDs, held Lovenox for procedure GI prophylaxis: Protonix Glucose control: per Cox Mobility: bed rest for now  last date of multidisciplinary goals of care:  Per Cox , wife and 2 brothers updated 12/21  Code Status: full  Disposition: ICU [facility]  Labs   CBC: Recent Labs  Lab 10/20/20 1217 10/22/20 0512 10/23/20 0358 10/24/20 0540 10/25/20 0618 10/26/20 0436 10/27/20 0428  WBC 8.1   < > 6.5 6.5 5.8 6.4 9.0  NEUTROABS 6.2  --   --   --   --   --   --   HGB 14.5   < > 13.6 12.6* 12.6* 14.4  13.2  HCT 49.8   < > 45.3 41.2 42.1 49.3 47.4  MCV 101.8*   < > 99.6 99.0 100.2* 99.4 105.3*  PLT 353   < > 261 232 196 194 195   < > = values in this interval not displayed.    Basic Metabolic Panel: Recent Labs  Lab 10/23/20 0358 10/24/20 0402 10/25/20 0618 10/26/20 0436 10/27/20 0428  NA 141 142 145 147* 149*  K 3.8 4.0 3.9 4.1 5.1  CL 99 100 98 99 103  CO2 30 32 37* 35* 36*  GLUCOSE 74 114* 120* 124* 129*  BUN 75* 72* 69* 60* 74*  CREATININE 3.46* 3.22* 2.84* 2.40* 3.50*  CALCIUM 8.6* 8.2* 8.8* 9.3 8.9  MG 2.0  --   --  1.9  --   PHOS 5.1*  --  4.6 3.8 7.1*   GFR: Estimated Creatinine Clearance: 32.7 mL/min (A) (by C-G formula based on SCr of 3.5 mg/dL (H)). Recent Labs  Lab 10/20/20 1217 10/20/20 1454 10/22/20 0512 10/23/20 0358 10/24/20 0540 10/25/20 0618 10/26/20 0436 10/27/20 0428  PROCALCITON 0.12  --  0.61  --   --   --   --   --   WBC 8.1  --  5.6   < > 6.5 5.8 6.4 9.0  LATICACIDVEN 0.7 0.6  --   --   --   --   --   --    < > = values in this interval not displayed.    Liver Function Tests: Recent Labs  Lab 10/20/20 1217 10/22/20 0512 10/24/20 0402 10/25/20 0618 10/26/20 0436 10/27/20 0428  AST 33 _0 --   --   ALT _1 --   --   ALKPHOS 114 84 71 75  --   --   BILITOT 0.4 1.0 0.6 0.8  --   --   PROT 7.9 6.2* 6.1* 6.9  --   --   ALBUMIN 3.2* 2.3* 2.6* 3.0* 3.1* 2.7*   No results for input(s): LIPASE, AMYLASE in the last 168 hours. No results for input(s): AMMONIA in the last 168 hours.  ABG  Component Value Date/Time   PHART 7.242 (L) 10/20/2020 2355   PCO2ART 79.3 (HH) 10/20/2020 2355   PO2ART 80.0 (L) 10/20/2020 2355   HCO3 27.6 10/20/2020 2355   O2SAT 93.6 10/20/2020 2355     Coagulation Profile: Recent Labs  Lab 10/20/20 1259  INR 1.5*    Cardiac Enzymes: No results for input(s): CKTOTAL, CKMB, CKMBINDEX, TROPONINI in the last 168 hours.  HbA1C: No results found for: HGBA1C  CBG: Recent Labs  Lab  10/26/20 1601 10/26/20 1948 10/26/20 2311 10/27/20 0348 10/27/20 0718  GLUCAP 109* 119* 109* 123* 111*     The patient is critically ill with multiple organ systems failure and requires high complexity decision making for assessment and support, frequent evaluation and titration of therapies, application of advanced monitoring technologies and extensive interpretation of multiple databases. Critical Care Time devoted to patient care services described in this note independent of APP/resident  time is 31 minutes.     Kara Mead MD. [place] Flood. [place] [facility] See Amion for pager  If no response to pager , please call [phone]  After 7:00 pm call [place]ink  [phone]   10/27/2020

## 2020-10-28 ENCOUNTER — Inpatient Hospital Stay (HOSPITAL_COMMUNITY): Payer: Medicare Other

## 2020-10-28 LAB — RENAL FUNCTION PANEL
Albumin: 2.5 g/dL — ABNORMAL LOW (ref 3.5–5.0)
Anion gap: 14 (ref 5–15)
BUN: 95 mg/dL — ABNORMAL HIGH (ref 6–20)
CO2: 33 mmol/L — ABNORMAL HIGH (ref 22–32)
Calcium: 8.9 mg/dL (ref 8.9–10.3)
Chloride: 104 mmol/L (ref 98–111)
Creatinine, Ser: 4.94 mg/dL — ABNORMAL HIGH (ref 0.61–1.24)
GFR, Estimated: 13 mL/min — ABNORMAL LOW
Glucose, Bld: 139 mg/dL — ABNORMAL HIGH (ref 70–99)
Phosphorus: 5.5 mg/dL — ABNORMAL HIGH (ref 2.5–4.6)
Potassium: 4.9 mmol/L (ref 3.5–5.1)
Sodium: 151 mmol/L — ABNORMAL HIGH (ref 135–145)

## 2020-10-28 LAB — CBC
HCT: 44.9 % (ref 39.0–52.0)
Hemoglobin: 12.9 g/dL — ABNORMAL LOW (ref 13.0–17.0)
MCH: 29.3 pg (ref 26.0–34.0)
MCHC: 28.7 g/dL — ABNORMAL LOW (ref 30.0–36.0)
MCV: 102 fL — ABNORMAL HIGH (ref 80.0–100.0)
Platelets: 170 K/uL (ref 150–400)
RBC: 4.4 MIL/uL (ref 4.22–5.81)
RDW: 14.2 % (ref 11.5–15.5)
WBC: 8.6 K/uL (ref 4.0–10.5)
nRBC: 0 % (ref 0.0–0.2)

## 2020-10-28 LAB — GLUCOSE, CAPILLARY
Glucose-Capillary: 122 mg/dL — ABNORMAL HIGH (ref 70–99)
Glucose-Capillary: 112 mg/dL — ABNORMAL HIGH (ref 70–99)
Glucose-Capillary: 117 mg/dL — ABNORMAL HIGH (ref 70–99)
Glucose-Capillary: 114 mg/dL — ABNORMAL HIGH (ref 70–99)
Glucose-Capillary: 124 mg/dL — ABNORMAL HIGH (ref 70–99)

## 2020-10-28 LAB — HEPARIN LEVEL (UNFRACTIONATED)
Heparin Unfractionated: 0.29 [IU]/mL — ABNORMAL LOW (ref 0.30–0.70)
Heparin Unfractionated: 0.39 IU/mL (ref 0.30–0.70)

## 2020-10-28 MED ORDER — ALBUMIN HUMAN 25 % IV SOLN
12.5000 g | Freq: Once | INTRAVENOUS | Status: AC
  Administered 2020-10-28: 12.5 g via INTRAVENOUS
  Filled 2020-10-28: qty 50

## 2020-10-28 MED ORDER — CEFEPIME HCL 2 G IJ SOLR
2.0000 g | INTRAMUSCULAR | Status: AC
  Administered 2020-10-28 – 2020-11-01 (×5): 2 g via INTRAVENOUS
  Filled 2020-10-28 (×5): qty 2

## 2020-10-28 NOTE — Progress Notes (Addendum)
ANTICOAGULATION CONSULT NOTE  Pharmacy Consult:  Heparin Indication: atrial fibrillation  No Known Allergies  Patient Measurements: Height: 5\' 8"  (172.7 cm) Weight: 128.9 kg (284 lb 2.8 oz) IBW/kg (Calculated) : 68.4 HEPARIN DW (KG): 103.8  Vital Signs: Temp: 99.9 F (37.7 C) (12/23 0733) Temp Source: Oral (12/23 0733) BP: 140/74 (12/23 0500) Pulse Rate: 83 (12/23 0733)  Labs: Recent Labs    10/26/20 0436 10/27/20 0428 10/27/20 1058 10/27/20 2045 10/28/20 0444  HGB 14.4 13.2  --   --  12.9*  HCT 49.3 47.4  --   --  44.9  PLT 194 195  --   --  170  APTT  --   --  33  --   --   HEPARINUNFRC  --   --  0.32 0.34 0.29*  CREATININE 2.40* 3.50*  --   --  4.94*    Estimated Creatinine Clearance: 22.4 mL/min (A) (by C-G formula based on SCr of 4.94 mg/dL (H)).  Assessment: Pharmacy consulted to dose heparin infusion for this 54 yo male with atrial fibrillation.  Patient was  taking apixaban PTA and was on treatment-dose Lovenox from 12-17 to 12-20. Heparin is being started due to patient's unstable renal function.   10/28/20 update Heparin level: 0.29 IU/mL, just slightly below goal range on heparin at 1450 units/hr CBC: Hb 12.9 (BL 14.4)    Plates: 194>195>170 RN reports some bloody secretions from trach site, but this isn't a new finding   Goal of Therapy:  Heparin level 0.3-0.7 units/ml Monitor platelets by anticoagulation protocol: Yes   Plan:  Increase heparin infusion to 1550 units/hr now Re-check heparin level at 1400 today Daily HL and CBC while on heparin Monitor for signs and symptoms of bleeding  Despina Pole, Pharm. D. Clinical Pharmacist 10/28/2020 8:26 AM

## 2020-10-28 NOTE — Progress Notes (Signed)
Patient ID: Jack Cox, male   DOB: 1965-12-07, 54 y.o.   MRN: [id number] HPI:  Mr. Barrales was seen in consultation on 10/23/20 due to the development of AKI/CKD stage IIIb/IV in the setting of decompensated CHF.  He was diuresed and his Scr slowly improved to his baseline of 2.4 on 10/26/20 and our service signed off.  Unfortunately his BUN/Cr have abruptly risen to 74/3.5 on 12/22 and 95/4.94 today and we were asked to re-evaluate this patient.  Of note, he had a significant diuresis during the first 3 days of his admission (-2300 on 12/18, -3426 on 12/19, and -3200 on 12/20) prior to the abrupt rise in BUN/Cr.  The trend is seen below.  Please refer to the initial consult on 10/23/20 for full details O:BP 140/74   Pulse 83   Temp 99.9 F (37.7 C) (Oral)   Resp (!) 28   Ht '5\' 8"'  (1.727 m)   Wt 128.9 kg   SpO2 96%   BMI 43.21 kg/m   Intake/Output Summary (Last 24 hours) at 10/28/2020 1229 Last data filed at 10/28/2020 7062 Gross per 24 hour  Intake 1175.32 ml  Output 1150 ml  Net 25.32 ml   Intake/Output: I/O last 3 completed shifts: In: 3217.3 [I.V.:1367.9; NG/GT:1549.3; IV Piggyback:300] Out: 1450 [Urine:1450]  Intake/Output this shift:  Total I/O In: 100 [IV Piggyback:100] Out: -  Weight change: -8.2 kg Gen: obese AAM on vent via trach in NAD CVS: RRR, no rub Resp: diminished BS bilaterally, no rales/rhonchi/wheezes Abd: obese, +BS, soft, NT Ext: 1+ pitting edema of lower extremities and presacral area  Recent Labs  Lab 10/22/20 0512 10/23/20 0358 10/24/20 0402 10/25/20 0618 10/26/20 0436 10/27/20 0428 10/28/20 0444  NA 142 141 142 145 147* 149* 151*  K 4.1 3.8 4.0 3.9 4.1 5.1 4.9  CL 100 99 100 98 99 103 104  CO2 32 30 32 37* 35* 36* 33*  GLUCOSE 62* 74 114* 120* 124* 129* 139*  BUN 74* 75* 72* 69* 60* 74* 95*  CREATININE 3.39* 3.46* 3.22* 2.84* 2.40* 3.50* 4.94*  ALBUMIN 2.3*  --  2.6* 3.0* 3.1* 2.7* 2.5*  CALCIUM 8.5* 8.6* 8.2* 8.8* 9.3 8.9 8.9  PHOS   --  5.1*  --  4.6 3.8 7.1* 5.5*  AST 22  --  29 27  --   --   --   ALT 20  --  16 16  --   --   --    Liver Function Tests: Recent Labs  Lab 10/22/20 0512 10/24/20 0402 10/25/20 0618 10/26/20 0436 10/27/20 0428 10/28/20 0444  AST '22 29 27  ' --   --   --   ALT '20 16 16  ' --   --   --   ALKPHOS 84 71 75  --   --   --   BILITOT 1.0 0.6 0.8  --   --   --   PROT 6.2* 6.1* 6.9  --   --   --   ALBUMIN 2.3* 2.6* 3.0* 3.1* 2.7* 2.5*   No results for input(s): LIPASE, AMYLASE in the last 168 hours. No results for input(s): AMMONIA in the last 168 hours. CBC: Recent Labs  Lab 10/24/20 0540 10/25/20 0618 10/26/20 0436 10/27/20 0428 10/28/20 0444  WBC 6.5 5.8 6.4 9.0 8.6  HGB 12.6* 12.6* 14.4 13.2 12.9*  HCT 41.2 42.1 49.3 47.4 44.9  MCV 99.0 100.2* 99.4 105.3* 102.0*  PLT 232 196 194 195 170   Cardiac Enzymes:  No results for input(s): CKTOTAL, CKMB, CKMBINDEX, TROPONINI in the last 168 hours. CBG: Recent Labs  Lab 10/27/20 2035 10/27/20 2353 10/28/20 0415 10/28/20 0732 10/28/20 1115  GLUCAP 115* 121* 122* 112* 117*    Iron Studies: No results for input(s): IRON, TIBC, TRANSFERRIN, FERRITIN in the last 72 hours. Studies/Results: DG Chest Port 1 View  Result Date: 10/27/2020 CLINICAL DATA:  Acute respiratory failure EXAM: PORTABLE CHEST 1 VIEW COMPARISON:  Yesterday FINDINGS: Enteric and tracheostomy tubes remain in good position. Cardiomegaly. Interstitial opacity which could be bronchitic or congestive. There is improved aeration at the right base. Negative for pneumothorax. IMPRESSION: 1. Improved aeration. 2. Bronchitic or congestive interstitial opacity. Electronically Signed   By: Monte Fantasia M.D.   On: 10/27/2020 07:46   . acetylcysteine  1 mL Nebulization QID  . albuterol  2.5 mg Nebulization QID  . chlorhexidine gluconate (MEDLINE KIT)  15 mL Mouth Rinse BID  . Chlorhexidine Gluconate Cloth  6 each Topical Daily  . mouth rinse  15 mL Mouth Rinse 10 times per  day  . pantoprazole sodium  40 mg Per Tube Q1200  . sodium chloride flush  3 mL Intravenous Q12H  . sodium chloride HYPERTONIC  4 mL Nebulization BID    BMET    Component Value Date/Time   NA 151 (H) 10/28/2020 0444   K 4.9 10/28/2020 0444   CL 104 10/28/2020 0444   CO2 33 (H) 10/28/2020 0444   GLUCOSE 139 (H) 10/28/2020 0444   BUN 95 (H) 10/28/2020 0444   CREATININE 4.94 (H) 10/28/2020 0444   CALCIUM 8.9 10/28/2020 0444   GFRNONAA 13 (L) 10/28/2020 0444   CBC    Component Value Date/Time   WBC 8.6 10/28/2020 0444   RBC 4.40 10/28/2020 0444   HGB 12.9 (L) 10/28/2020 0444   HCT 44.9 10/28/2020 0444   PLT 170 10/28/2020 0444   MCV 102.0 (H) 10/28/2020 0444   MCH 29.3 10/28/2020 0444   MCHC 28.7 (L) 10/28/2020 0444   RDW 14.2 10/28/2020 0444   LYMPHSABS 0.9 10/20/2020 1217   MONOABS 0.8 10/20/2020 1217   EOSABS 0.1 10/20/2020 1217   BASOSABS 0.1 10/20/2020 1217     Assessment/Plan:  1. AKI/CKD stage IV- presumably cardiorenal syndrome but improved initially only to worsen over the past 48 hours.  Likely progressed to ischemic ATN in setting of aggressive diuresis.  He continues to make urine despite holding diuretics.  No contrasted studies.  He does not have any acute indications for dialysis at this time and any decision to undertake chronic dialysis will need to be after extensive discussions with the patient/family given his current comorbidities  1. Continue to hold diuretics and follow.   2. Will order FeNa and renal US. 3. Avoid nephrotoxic agents such as NSAIDs (was on meloxicam at home), Cox-II I's, IV contrast, and phosphate containing bowel preparations 4. Continue to follow UOP and daily Scr. 5. Will order IV albumin and follow. 2. Acute exacerbation of diastolic CHF/cor pulmonale- he is negative  3. 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 4. HTN- stable 5. Hypernatremia- will need free water and  follow 6. CKD stage IIIb/IV- normally followed by Dr. 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

## 2020-10-28 NOTE — Progress Notes (Signed)
PROGRESS NOTE  Jack Cox JYN:[id number] DOB: Jan 02, 1966 DOA: 10/20/2020 PCP: [name], MD   Brief History: 54 y.o.malewith medical history ofdiastolic CHF, atrial flutter, obstructive sleep apnea, hypertension, right bundle branch block presenting with shortness of breath for the last 2 to 3 days. Unfortunately, the patient is encephalopathic at the time of my evaluation; therefore, history is limited. Apparently, the patient went to see his nephrologist earlier on 10/20/2020. He was noted to have oxygen saturation in the 60s. As result, he was sent to the emergency department for further evaluation. Initially, the patient was awake and conversant with oxygen saturation in the low 70s. He was placed on a nonrebreather after which the patient became obtunded. VBG showed pH 7.1 66/97/106 on 80%. Subsequently, the patient was placed on the ventilator with PRVC,PEEP of 5, FiO2 70%. After approximately 10 to 15 minutes on the ventilator, the patient remained somnolent, but awakened and was able to tell me his name. He did state that he has not been taking his torsemide as directed. In addition, the patient has been sleeping in a recliner for an unclear duration of time secondary to shortness of breath. At baseline, the patient has a size 4 noncuffed tracheostomy. He is on supplemental oxygen 2.5 L during the day and 4 L at nighttime. There is no reports of nausea, vomiting, diarrhea, chest pain, abdominal pain. In the emergency department, the patient was afebrile and hemodynamically stable with oxygen saturation 93-94% on the ventilator with FiO2 70%. Chest x-ray showed left-sided pleural effusion with increased interstitial markings. BNP 790. PCT 0.12. BMP showed a serum creatinine 2.43. LFTs were unremarkable. WBC 8.1, hemoglobin 14.5, platelets 253,000. The patient was given ceftriaxone and azithromycin. He was also given furosemide 80 mg IV x1. COVID-19  RT-PCR negative.  He was continued on IV lasix 80 mg bid with uptrending of his serum creatinine.  Renal was consulted to assist.   Assessment/Plan: Acute on chronic respiratory failure with hypoxia and hypercarbia -Secondary to CHF in the setting of OSA -remainson ventilator -10/26/20 personally reviewed CXR--increased interstitial markings--?retrocardiac opacity -Vent settings PC16,PEEP [id number]%>>>50% -Pulmonary consultappreciated -PCT 0.12>>0.61 -having mucus plugging--needs aggressive suctioning -12/17 CXR--white out of left lung -continue mucomyst and albuterol -10/26/20-bronchoscopy--Thick inspissated mucus in proximal airways & BL lower lobes, removed with suctioning - hypertonic saline nebulizer treatments started by pulmonology 12/22  Acute on chronic diastolic CHF/Cor Pulmonale -06/25/2019 echo EF 55%, trivial TR -12/16/21echo--EF 60-65%, no WMA, RV pressure overload, mild TR -restarted IV lasix with albumin per renal with excellent response -pt remains clinically fluid overloaded -Daily BMP -Accurate I's and O's--NEG 13L -Daily weights I/O last 3 completed shifts: In: 3217.3 [I.V.:1367.9; NG/GT:1549.3; IV Piggyback:300] Out: 1450 [Urine:1450] Total I/O In: 100 [IV Piggyback:100] Out: -   Atrial flutter -restart apixaban 10/27/20 if no further procedures planned -rate controlledbut had an episode 3-4 hours 12/18-12/19 evening -temporarily held metoprolol due to soft BPs.   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  -Monitor with diuresis -consult nephrology--appreciated-->albumin+lasix bid per renal  FEN -had mild hypoglycemia -NG placed 12/17 -switchedto Vital High Protein and increase rate to 40 cc/hr-->hypoglycemia improved  Fever - improving with pn -101.0 fever evening 12/18 and 100.7 12/21 -blood cultures x 2--neg to date -UA and urine  culture--no significant pyuria -12/16 trach aspirate-->flora -10/26/20-->BAL sent for culture -start IV cefepime due to fevers, CXR findings  Essential hypertension -Anticipate improvement with diuresis -metoprolol temporarily on hold with soft BPs reported  OSA -The patient has a tracheostomy placed in 2001 -He has a size 4 cuffless trach  Right bundle branch block -This has been chronic with review of the medical records  Morbid obesity -BMI 52.94 -lifestyle modification  GOC -palliative following -currently full scope of care   Status is: Inpatient  Remains inpatient appropriate because:IV treatments appropriate due to intensity of illness or inability to take PO   Dispo: The patient is from:Home Anticipated d/c is ZS:WFUX Anticipated d/c date is: 3 days Patient currently is not medically stable to d/c.   Family Communication:brotherupdated bedside 12/20-21  Consultants:pulm, renal  Code Status: FULL  DVT Prophylaxis: IV heparin infusion for full anticoagulation   Procedures: As Listed in Progress Note Above  Antibiotics: Cefepime 12/21>>  Subjective: Patient sedated on vent.   Objective: Vitals:   10/28/20 0500 10/28/20 0719 10/28/20 0733 10/28/20 1125  BP: 140/74     Pulse: 81  83   Resp: (!) 21  (!) 28   Temp:   99.9 F (37.7 C)   TempSrc:   Oral   SpO2: 95% 97% 96% 96%  Weight: 128.9 kg     Height:        Intake/Output Summary (Last 24 hours) at 10/28/2020 1246 Last data filed at 10/28/2020 3235 Gross per 24 hour  Intake 1175.32 ml  Output 1150 ml  Net 25.32 ml   Weight change: -8.2 kg Exam:  General - pt is sedated on vent.  Neck - supple, no JVD.  Lungs: poor air movement bilateral. No increased WOB. Expiratory wheeze heard on LUL.  CV - normal s1,s2 sounds. No MRG.  Abd - obese, soft, no masses palpated Ext - 1+ pitting edema BLEs.  Neuro - sedated on  vent  Data Reviewed: I have personally reviewed following labs and imaging studies Basic Metabolic Panel: Recent Labs  Lab 10/23/20 0358 10/24/20 0402 10/25/20 0618 10/26/20 0436 10/27/20 0428 10/28/20 0444  NA 141 142 145 147* 149* 151*  K 3.8 4.0 3.9 4.1 5.1 4.9  CL 99 100 98 99 103 104  CO2 30 32 37* 35* 36* 33*  GLUCOSE 74 114* 120* 124* 129* 139*  BUN 75* 72* 69* 60* 74* 95*  CREATININE 3.46* 3.22* 2.84* 2.40* 3.50* 4.94*  CALCIUM 8.6* 8.2* 8.8* 9.3 8.9 8.9  MG 2.0  --   --  1.9  --   --   PHOS 5.1*  --  4.6 3.8 7.1* 5.5*   Liver Function Tests: Recent Labs  Lab 10/22/20 0512 10/24/20 0402 10/25/20 0618 10/26/20 0436 10/27/20 0428 10/28/20 0444  AST _0 --   --   --   ALT _1 --   --   --   ALKPHOS 84 71 75  --   --   --   BILITOT 1.0 0.6 0.8  --   --   --   PROT 6.2* 6.1* 6.9  --   --   --   ALBUMIN 2.3* 2.6* 3.0* 3.1* 2.7* 2.5*   No results for input(s): LIPASE, AMYLASE in the last 168 hours. No results for input(s): AMMONIA in the last 168 hours. Coagulation Profile: No results for input(s): INR, PROTIME in the last 168 hours. CBC: Recent Labs  Lab 10/24/20 0540 10/25/20 0618 10/26/20 0436 10/27/20 0428 10/28/20 0444  WBC 6.5 5.8 6.4 9.0 8.6  HGB 12.6* 12.6* 14.4 13.2 12.9*  HCT 41.2 42.1 49.3 47.4 44.9  MCV 99.0 100.2* 99.4 105.3* 102.0*  PLT 232 196 194 195 170   Cardiac Enzymes: No results for input(s): CKTOTAL, CKMB, CKMBINDEX, TROPONINI in the last 168 hours. BNP: Invalid input(s): POCBNP CBG: Recent Labs  Lab 10/27/20 2035 10/27/20 2353 10/28/20 0415 10/28/20 0732 10/28/20 1115  GLUCAP 115* 121* 122* 112* 117*   HbA1C: No results for input(s): HGBA1C in the last 72 hours. Urine analysis:    Component Value Date/Time   COLORURINE YELLOW 10/24/2020 [place] 10/24/2020 1239   LABSPEC 1.010 10/24/2020 1239   PHURINE 5.0 10/24/2020 1239   GLUCOSEU NEGATIVE 10/24/2020 1239   HGBUR NEGATIVE  10/24/2020 1239   BILIRUBINUR NEGATIVE 10/24/2020 1239   KETONESUR NEGATIVE 10/24/2020 1239   PROTEINUR 100 (A) 10/24/2020 1239   NITRITE NEGATIVE 10/24/2020 1239   LEUKOCYTESUR NEGATIVE 10/24/2020 1239    Recent Results (from the past 240 hour(s))  Resp Panel by RT-PCR (Flu A&B, Covid) Nasopharyngeal Swab     Status: None   Collection Time: 10/20/20 11:43 AM   Specimen: Nasopharyngeal Swab; Nasopharyngeal(NP) swabs in vial transport medium  Result Value Ref Range Status   SARS Coronavirus 2 by RT PCR NEGATIVE NEGATIVE Final    Comment: (NOTE) SARS-CoV-2 target nucleic acids are NOT DETECTED.  The SARS-CoV-2 RNA is generally detectable in upper respiratory specimens during the acute phase of infection. The lowest concentration of SARS-CoV-2 viral copies this assay can detect is 138 copies/mL. A negative result does not preclude SARS-Cov-2 infection and should not be used as the sole basis for treatment or other patient management decisions. A negative result may occur with  improper specimen collection/handling, submission of specimen other than nasopharyngeal swab, presence of viral mutation(s) within the areas targeted by this assay, and inadequate number of viral copies(<138 copies/mL). A negative result must be combined with clinical observations, patient history, and epidemiological information. The expected result is Negative.  Fact Sheet for Patients:  EntrepreneurPulse.com.au  Fact Sheet for Healthcare Providers:  IncredibleEmployment.be  This test is no t yet approved or cleared by the [place] FDA and  has been authorized for detection and/or diagnosis of SARS-CoV-2 by FDA under an Emergency Use Authorization (EUA). This EUA will remain  in effect (meaning this test can be used) for the duration of the COVID-19 declaration under Section 564(b)(1) of the Act, 21 U.S.C.section 360bbb-3(b)(1), unless the authorization is  terminated  or revoked sooner.       Influenza A by PCR NEGATIVE NEGATIVE Final   Influenza B by PCR NEGATIVE NEGATIVE Final    Comment: (NOTE) The Xpert Xpress SARS-CoV-2/FLU/RSV plus assay is intended as an aid in the diagnosis of influenza from Nasopharyngeal swab specimens and should not be used as a sole basis for treatment. Nasal washings and aspirates are unacceptable for Xpert Xpress SARS-CoV-2/FLU/RSV testing.  Fact Sheet for Patients: EntrepreneurPulse.com.au  Fact Sheet for Healthcare Providers: IncredibleEmployment.be  This test is not yet approved or cleared by the [place] FDA and has been authorized for detection and/or diagnosis of SARS-CoV-2 by FDA under an Emergency Use Authorization (EUA). This EUA will remain in effect (meaning this test can be used) for the duration of the COVID-19 declaration under Section 564(b)(1) of the Act, 21 U.S.C. section 360bbb-3(b)(1), unless the authorization is terminated or revoked.  Performed at [facility], [address] [place], [place] [address]   Blood culture (routine x 2)     Status: None   Collection  Time: 10/20/20 12:18 PM   Specimen: BLOOD  Result Value Ref Range Status   Specimen Description BLOOD  Final   Special Requests NONE  Final   Culture   Final    NO GROWTH 5 DAYS Performed at [facility], [address], [place], [place] [address]    Report Status 10/25/2020 FINAL  Final  Blood culture (routine x 2)     Status: None   Collection Time: 10/20/20 12:59 PM   Specimen: BLOOD RIGHT ARM  Result Value Ref Range Status   Specimen Description BLOOD RIGHT ARM  Final   Special Requests   Final    BOTTLES DRAWN AEROBIC AND ANAEROBIC Blood Culture adequate volume   Culture   Final    NO GROWTH 5 DAYS Performed at [facility], [address] [place], [place] [address]    Report Status 10/25/2020 FINAL  Final  Urine culture     Status: None   Collection Time:  10/20/20  3:42 PM   Specimen: Urine, Catheterized  Result Value Ref Range Status   Specimen Description   Final    URINE, CATHETERIZED Performed at [facility], [address] [place], [place] [address]    Special Requests   Final    NONE Performed at [facility], [address] [place], [place] [address]    Culture   Final    NO GROWTH Performed at [facility], [place] [address], [place], [place] [address]    Report Status 10/21/2020 FINAL  Final  MRSA PCR Screening     Status: None   Collection Time: 10/20/20  6:42 PM   Specimen: Nasal Mucosa; Nasopharyngeal  Result Value Ref Range Status   MRSA by PCR NEGATIVE NEGATIVE Final    Comment:        The GeneXpert MRSA Assay (FDA approved for NASAL specimens only), is one component of a comprehensive MRSA colonization surveillance program. It is not intended to diagnose MRSA infection nor to guide or monitor treatment for MRSA infections. Performed at [facility], [address] [place], [place] [address]   Culture, respiratory (non-expectorated)     Status: None   Collection Time: 10/21/20  8:20 AM   Specimen: Tracheal Aspirate; Respiratory  Result Value Ref Range Status   Specimen Description   Final    TRACHEAL ASPIRATE Performed at [facility], [address], [place], [place] [address]    Special Requests   Final    NONE Performed at [facility], [address], [place], [place] [address]    Gram Stain   Final    FEW WBC PRESENT, PREDOMINANTLY MONONUCLEAR NO ORGANISMS SEEN    Culture   Final    RARE Normal respiratory flora-no Staph aureus or Pseudomonas seen Performed at [facility] [place] [address] [place], [place] [address]    Report Status 10/23/2020 FINAL  Final  Culture, Urine     Status: None   Collection Time: 10/24/20 12:39 PM   Specimen: Urine, Catheterized  Result Value Ref Range Status   Specimen Description   Final    URINE, CATHETERIZED Performed at [facility], [address] [place], [place] [address]    Special Requests   Final    NONE Performed at [facility], [address] [place], [place] [address]    Culture   Final    NO GROWTH Performed at [facility], [place] [address], [place], [place] [address]    Report Status  10/25/2020 FINAL  Final  Culture, blood (Routine X 2) w Reflex to ID Panel     Status: None (Preliminary result)   Collection Time: 10/24/20  1:34 PM   Specimen: BLOOD RIGHT HAND  Result Value Ref Range Status   Specimen Description   Final    BLOOD RIGHT HAND BOTTLES DRAWN AEROBIC AND ANAEROBIC   Special Requests Blood Culture adequate volume  Final   Culture   Final    NO GROWTH 4 DAYS Performed at [facility], [address], [place], [place] [address]    Report Status PENDING  Incomplete  Culture, blood (Routine X 2) w Reflex to ID Panel     Status: None (Preliminary result)   Collection Time: 10/24/20  1:44 PM   Specimen: BLOOD LEFT WRIST  Result Value Ref Range Status   Specimen Description   Final    BLOOD LEFT WRIST BOTTLES DRAWN AEROBIC AND ANAEROBIC   Special Requests Blood Culture adequate volume  Final   Culture   Final    NO GROWTH 4 DAYS Performed at [facility], [address], [place], [place] [address]    Report Status PENDING  Incomplete  Culture, respiratory (non-expectorated)     Status: None (Preliminary result)   Collection Time: 10/26/20  6:07 PM   Specimen: Tracheal Aspirate; Respiratory  Result Value Ref Range Status   Specimen Description   Final    TRACHEAL ASPIRATE Performed at [facility], [address], [place], [place] [address]    Special Requests   Final    Normal Performed at [facility], [address], [place], [place] [address]    Gram Stain   Final    FEW WBC PRESENT, PREDOMINANTLY PMN MODERATE GRAM POSITIVE RODS FEW GRAM POSITIVE COCCI FEW GRAM NEGATIVE RODS    Culture   Final    FEW Normal respiratory flora-no Staph aureus or Pseudomonas seen Performed at [facility], [place] [address] [place],  [address]    Report Status PENDING  Incomplete     Scheduled Meds: . acetylcysteine  1 mL Nebulization QID  . albuterol  2.5 mg Nebulization QID  . chlorhexidine gluconate (MEDLINE KIT)  15 mL Mouth Rinse BID  . Chlorhexidine Gluconate Cloth  6 each Topical Daily  . mouth rinse  15 mL Mouth Rinse 10 times per day  . pantoprazole sodium  40 mg Per Tube Q1200  . sodium chloride flush  3 mL Intravenous Q12H  . sodium chloride HYPERTONIC  4 mL Nebulization BID   Continuous Infusions: . sodium chloride    . sodium chloride    . albumin human    . amiodarone    . ceFEPime (MAXIPIME) IV    . dexmedetomidine (PRECEDEX) IV infusion 0.9 mcg/kg/hr (10/28/20 0207)  . feeding supplement (VITAL HIGH PROTEIN) 1,000 mL (10/27/20 1604)  . heparin 1,550 Units/hr (10/28/20 0847)   Procedures/Studies: DG Chest Port 1 View  Result Date: 10/27/2020 CLINICAL DATA:  Acute respiratory failure EXAM: PORTABLE CHEST 1 VIEW COMPARISON:  Yesterday FINDINGS: Enteric and tracheostomy tubes remain in good position. Cardiomegaly. Interstitial opacity which could be bronchitic or congestive. There is improved aeration at the right base. Negative for pneumothorax. IMPRESSION: 1. Improved aeration. 2. Bronchitic or congestive interstitial opacity. Electronically Signed   By: Monte Fantasia M.D.   On: 10/27/2020 07:46   DG Chest Port 1 View  Result Date: 10/26/2020 CLINICAL DATA:  Shortness of breath for 2-3 days EXAM: PORTABLE CHEST 1 VIEW  COMPARISON:  Three days ago FINDINGS: Progressive hazy appearance of the right mid to lower chest. Dense retrocardiac opacity persists. An enteric tube and tracheostomy tube remain in place. Cardiomegaly and vascular pedicle widening. IMPRESSION: Pleural fluid/pulmonary opacification at the bases which has progressed from 3 days ago. Electronically Signed   By: Monte Fantasia M.D.   On: 10/26/2020 06:02   DG CHEST PORT 1 VIEW  Result  Date: 10/23/2020 CLINICAL DATA:  Shortness of breath. EXAM: PORTABLE CHEST 1 VIEW COMPARISON:  October 22, 2020 FINDINGS: The near complete opacification of the left hemithorax seen on the comparison chest x-ray has resolved. Persistent bibasilar opacities are likely atelectasis. No pneumothorax. A tracheostomy tube is in good position. An NG tube terminates in the stomach. No other acute abnormalities. IMPRESSION: 1. Support apparatus as above. 2. Bibasilar atelectasis and a possible small left effusion. Improved aeration of the left lung. Electronically Signed   By: Dorise Bullion III M.D   On: 10/23/2020 14:57   DG Chest Port 1 View  Result Date: 10/22/2020 CLINICAL DATA:  Acute respiratory failure. EXAM: PORTABLE CHEST 1 VIEW COMPARISON:  10/20/2020 FINDINGS: Tracheostomy tube overlies the airway. The cardiac silhouette is enlarged. There is persistent mild central pulmonary vascular congestion without overt edema. Airspace opacities in the left greater than right mid and lower lungs have slightly improved. Small pleural effusions are questioned. No pneumothorax is identified. IMPRESSION: Slight improvement of bilateral airspace opacities which could reflect pneumonia or atelectasis. Electronically Signed   By: [name] M.D.   On: 10/22/2020 09:16   DG Chest Port 1 View  Result Date: 10/20/2020 CLINICAL DATA:  Questionable sepsis EXAM: PORTABLE CHEST 1 VIEW COMPARISON:  Chest x-ray 06/12/2018 report without images. FINDINGS: Tracheostomy tube terminates approximately 7.5 cm above the carina. The heart size and mediastinal contours are not well visualized. Bilateral hilar vasculature prominence. Aortic arch calcifications. Silhouetting off of the left hemidiaphragm and left cardiac border. Partial silhouetting off the right cardiac border and right hemidiaphragm. Increased interstitial markings. Likely at least trace to small volume left pleural effusion. No pneumothorax. No acute osseous  abnormality. IMPRESSION: 1. Left lung opacity and likely at least trace to small volume left pleural effusion with silhouetting off of the left diaphragm and heart border. Concern for infection/inflammation. 2. Right lower lobe and middle lobe airspace opacity could represent a combination of atelectasis, infection, inflammation. 3. At least mild pulmonary edema. 4. Consider chest x-ray PA and lateral view for further evaluation. Electronically Signed   By: Iven Finn M.D.   On: 10/20/2020 12:26   DG Chest Port 1V same Day  Result Date: 10/22/2020 CLINICAL DATA:  Status post NG tube placement. EXAM: PORTABLE CHEST 1 VIEW COMPARISON:  Single-view of the chest earlier today. FINDINGS: The upper chest is off the margin of the film. NG tube is looped in the upper trachea or esophagus. Visualized left chest is completely whited out on this study. IMPRESSION: NG tube is looped in the upper esophagus or trachea. Complete whiteout of the left chest is new since the study earlier today could be due to mucous plugging and left lung collapse or rapidly developing pleural effusion and airspace disease. Electronically Signed   By: [name] M.D.   On: 10/22/2020 15:16   DG Abd Portable 1V  Result Date: 10/22/2020 CLINICAL DATA:  Nasogastric tube placement EXAM: PORTABLE ABDOMEN - 1 VIEW COMPARISON:  Portable exam 1613 hours compared to 1523 hours FINDINGS: Tip of nasogastric tube projects over distal  gastric antrum, consider withdrawal 5 cm. Bowel gas pattern normal. Opacification of the inferior LEFT hemithorax again seen. IMPRESSION: Tip of nasogastric tube projects over distal gastric antrum. Consider withdrawal of nasogastric tube 5 cm. Electronically Signed   By: [name] M.D.   On: 10/22/2020 16:36   DG Abd Portable 1V  Result Date: 10/22/2020 CLINICAL DATA:  Nasogastric tube placement EXAM: PORTABLE ABDOMEN - 1 VIEW COMPARISON:  None. FINDINGS: Nasogastric tube extends into the gastric cardia  where it loops upon itself with the tip at the junction of the mid and distal thirds of the esophagus. There is no bowel dilatation or air-fluid level to suggest bowel obstruction. No free air. There is consolidation left lower lobe. IMPRESSION: Nasogastric tube extends into the stomach where it loops upon itself with the tip at the junction of the mid and distal thirds of the esophagus. No bowel obstruction or free air evident. Consolidation left lower lobe. These results will be called to the ordering clinician or representative by the Radiologist Assistant, and communication documented in the PACS or [employer]. Electronically Signed   By: Lowella Grip III M.D.   On: 10/22/2020 15:34   ECHOCARDIOGRAM COMPLETE  Result Date: 10/21/2020    ECHOCARDIOGRAM REPORT   Patient Name:   SUVAN STCYR Date of Exam: 10/21/2020 Medical Rec #:  [id number]        Height:       68.0 in Accession #:    [id number]       Weight:       325.8 lb Date of Birth:  [birth year]        BSA:          2.515 m Patient Age:    79 years         BP:           112/61 mmHg Patient Gender: M                HR:           51 bpm. Exam Location:  [facility] Procedure: 2D Echo Indications:    CHF-Acute Diastolic O67.67  History:        Patient has no prior history of Echocardiogram examinations.                 CHF; Risk Factors:Non-Smoker. Acute on chronic respiratory                 failure with hypoxia.  Sonographer:    Leavy Cella RDCS (AE) Referring Phys: [phone] DAVID TAT IMPRESSIONS  1. Left ventricular ejection fraction, by estimation, is 60 to 65%. The left ventricle has normal function. The left ventricle has no regional wall motion abnormalities. There is severe left ventricular hypertrophy. Left ventricular diastolic parameters  are indeterminate. There is the interventricular septum is flattened in systole and diastole, consistent with right ventricular pressure and volume overload.  2. Right ventricular systolic function  is mildly reduced. The right ventricular size is mildly enlarged. Tricuspid regurgitation signal is inadequate for assessing PA pressure.  3. Right atrial size was upper normal.  4. The pericardial effusion is posterior to the left ventricle.  5. The mitral valve is grossly normal. Trivial mitral valve regurgitation.  6. The aortic valve is tricuspid. Aortic valve regurgitation is not visualized.  7. The inferior vena cava is normal in size with greater than 50% respiratory variability, suggesting right atrial pressure of 3 mmHg. FINDINGS  Left Ventricle: Left ventricular ejection fraction,  by estimation, is 60 to 65%. The left ventricle has normal function. The left ventricle has no regional wall motion abnormalities. The left ventricular internal cavity size was normal in size. There is  severe left ventricular hypertrophy. The interventricular septum is flattened in systole and diastole, consistent with right ventricular pressure and volume overload. Left ventricular diastolic parameters are indeterminate. Right Ventricle: The right ventricular size is mildly enlarged. No increase in right ventricular wall thickness. Right ventricular systolic function is mildly reduced. Tricuspid regurgitation signal is inadequate for assessing PA pressure. Left Atrium: Left atrial size was normal in size. Right Atrium: Right atrial size was upper normal. Pericardium: Trivial pericardial effusion is present. The pericardial effusion is posterior to the left ventricle. Mitral Valve: The mitral valve is grossly normal. Trivial mitral valve regurgitation. Tricuspid Valve: The tricuspid valve is grossly normal. Tricuspid valve regurgitation is mild. Aortic Valve: The aortic valve is tricuspid. There is moderate aortic valve annular calcification. Aortic valve regurgitation is not visualized. Pulmonic Valve: The pulmonic valve was grossly normal. Pulmonic valve regurgitation is trivial. Aorta: The aortic root is normal in size and  structure. Venous: The inferior vena cava is normal in size with greater than 50% respiratory variability, suggesting right atrial pressure of 3 mmHg. IAS/Shunts: No atrial level shunt detected by color flow Doppler.  LEFT VENTRICLE PLAX 2D LVIDd:         4.66 cm  Diastology LVIDs:         2.03 cm  LV e' medial:    5.87 cm/s LV PW:         1.76 cm  LV E/e' medial:  14.0 LV IVS:        1.73 cm  LV e' lateral:   8.49 cm/s LVOT diam:     1.90 cm  LV E/e' lateral: 9.7 LVOT Area:     2.84 cm  RIGHT VENTRICLE RV S prime:     19.40 cm/s TAPSE (M-mode): 1.9 cm LEFT ATRIUM             Index       RIGHT ATRIUM           Index LA diam:        3.50 cm 1.39 cm/m  RA Area:     24.60 cm LA Vol (A2C):   43.9 ml 17.46 ml/m RA Volume:   82.30 ml  32.73 ml/m LA Vol (A4C):   43.8 ml 17.42 ml/m LA Biplane Vol: 44.4 ml 17.66 ml/m   AORTA Ao Root diam: 3.10 cm MITRAL VALVE MV Area (PHT): 2.64 cm    SHUNTS MV Decel Time: 287 msec    Systemic Diam: 1.90 cm MV E velocity: 82.30 cm/s MV A velocity: 59.10 cm/s MV E/A ratio:  1.39 Rozann Lesches MD Electronically signed by Rozann Lesches MD Signature Date/Time: 10/21/2020/4:33:43 PM    Final     Critical Care Procedure Note Authorized and Performed by: Murvin Natal MD  Total Critical Care time: 33 mins  Due to a high probability of clinically significant, life threatening deterioration, the patient required my highest level of preparedness to intervene emergently and I personally spent this critical care time directly and personally managing the patient.  This critical care time included obtaining a history; examining the patient, pulse oximetry; ordering and review of studies; arranging urgent treatment with development of a management plan; evaluation of patient's response of treatment; frequent reassessment; and discussions with other providers.  This critical care time was performed to assess  and manage the high probability of imminent and life threatening deterioration that  could result in multi-organ failure.  It was exclusive of separately billable procedures and treating other patients and teaching time.   Irwin Brakeman, MD  How to contact the [facility] Attending or Consulting provider [place] or covering provider during after hours [place], for this patient?  1. Check the care team in [facility] and look for a) attending/consulting [facility] provider listed and b) the [facility] team listed 2. Log into [email or web address] and use [place]'s universal password to access. If you do not have the password, please contact the hospital operator. 3. Locate the [facility] provider you are looking for under [facility] and page to a number that you can be directly reached. 4. If you still have difficulty reaching the provider, please page the [facility] (Director on Call) for the Hospitalists listed on amion for assistance.  10/28/2020, 12:46 PM   LOS: 8 days

## 2020-10-28 NOTE — Progress Notes (Signed)
ANTICOAGULATION CONSULT NOTE  Pharmacy Consult:  Heparin Indication: atrial fibrillation  No Known Allergies  Patient Measurements: Height: 5\' 8"  (172.7 cm) Weight: 128.9 kg (284 lb 2.8 oz) IBW/kg (Calculated) : 68.4 HEPARIN DW (KG): 103.8  Vital Signs: Temp: 99.9 F (37.7 C) (12/23 0733) Temp Source: Oral (12/23 0733) BP: 139/124 (12/23 1500) Pulse Rate: 121 (12/23 1500)  Labs: Recent Labs    10/26/20 0436 10/27/20 0428 10/27/20 1058 10/27/20 1058 10/27/20 2045 10/28/20 0444 10/28/20 1431  HGB 14.4 13.2  --   --   --  12.9*  --   HCT 49.3 47.4  --   --   --  44.9  --   PLT 194 195  --   --   --  170  --   APTT  --   --  33  --   --   --   --   HEPARINUNFRC  --   --  0.32   < > 0.34 0.29* 0.39  CREATININE 2.40* 3.50*  --   --   --  4.94*  --    < > = values in this interval not displayed.    Estimated Creatinine Clearance: 22.4 mL/min (A) (by C-G formula based on SCr of 4.94 mg/dL (H)).  Assessment: Pharmacy consulted to dose heparin infusion for this 54 yo male with atrial fibrillation.  Patient was  taking apixaban PTA and was on treatment-dose Lovenox from 12-17 to 12-20. Heparin is being started due to patient's unstable renal function.   10/28/20 1600 update Heparin level: 0.39 IU/mL now within  goal range on heparin at 1550 units/hr CBC: Hb 12.9 (BL 14.4)    Plates: 194>195>170 RN reports that bloody secretions from trach site are improving  Goal of Therapy:  Heparin level 0.3-0.7 units/ml Monitor platelets by anticoagulation protocol: Yes   Plan:  Continue heparin infusion at 1550 units/hr   Confirmatory heparin level at 2200  today Daily HL and CBC while on heparin Monitor for signs and symptoms of bleeding  Despina Pole, Pharm. D. Clinical Pharmacist 10/28/2020 4:09 PM

## 2020-10-29 LAB — CBC
HCT: 44.3 % (ref 39.0–52.0)
Hemoglobin: 12.9 g/dL — ABNORMAL LOW (ref 13.0–17.0)
MCH: 29.5 pg (ref 26.0–34.0)
MCHC: 29.1 g/dL — ABNORMAL LOW (ref 30.0–36.0)
MCV: 101.4 fL — ABNORMAL HIGH (ref 80.0–100.0)
Platelets: 179 10*3/uL (ref 150–400)
RBC: 4.37 MIL/uL (ref 4.22–5.81)
RDW: 14.2 % (ref 11.5–15.5)
WBC: 8.3 10*3/uL (ref 4.0–10.5)
nRBC: 0 % (ref 0.0–0.2)

## 2020-10-29 LAB — GLUCOSE, CAPILLARY
Glucose-Capillary: 120 mg/dL — ABNORMAL HIGH (ref 70–99)
Glucose-Capillary: 121 mg/dL — ABNORMAL HIGH (ref 70–99)
Glucose-Capillary: 128 mg/dL — ABNORMAL HIGH (ref 70–99)
Glucose-Capillary: 134 mg/dL — ABNORMAL HIGH (ref 70–99)
Glucose-Capillary: 135 mg/dL — ABNORMAL HIGH (ref 70–99)
Glucose-Capillary: 137 mg/dL — ABNORMAL HIGH (ref 70–99)
Glucose-Capillary: 138 mg/dL — ABNORMAL HIGH (ref 70–99)

## 2020-10-29 LAB — RENAL FUNCTION PANEL
Albumin: 2.5 g/dL — ABNORMAL LOW (ref 3.5–5.0)
Anion gap: 12 (ref 5–15)
BUN: 108 mg/dL — ABNORMAL HIGH (ref 6–20)
CO2: 34 mmol/L — ABNORMAL HIGH (ref 22–32)
Calcium: 9.1 mg/dL (ref 8.9–10.3)
Chloride: 107 mmol/L (ref 98–111)
Creatinine, Ser: 4.83 mg/dL — ABNORMAL HIGH (ref 0.61–1.24)
GFR, Estimated: 14 mL/min — ABNORMAL LOW (ref >60)
Glucose, Bld: 138 mg/dL — ABNORMAL HIGH (ref 70–99)
Phosphorus: 4 mg/dL (ref 2.5–4.6)
Potassium: 4.5 mmol/L (ref 3.5–5.1)
Sodium: 153 mmol/L — ABNORMAL HIGH (ref 135–145)

## 2020-10-29 LAB — CULTURE, BLOOD (ROUTINE X 2)
Culture: NO GROWTH
Culture: NO GROWTH
Special Requests: ADEQUATE
Special Requests: ADEQUATE

## 2020-10-29 LAB — URINALYSIS, COMPLETE (UACMP) WITH MICROSCOPIC
Bilirubin Urine: NEGATIVE
Glucose, UA: NEGATIVE mg/dL
Ketones, ur: NEGATIVE mg/dL
Leukocytes,Ua: NEGATIVE
Nitrite: NEGATIVE
Protein, ur: 100 mg/dL — AB
Specific Gravity, Urine: 1.014 (ref 1.005–1.030)
pH: 5 (ref 5.0–8.0)

## 2020-10-29 LAB — CULTURE, RESPIRATORY W GRAM STAIN
Culture: NORMAL
Special Requests: NORMAL

## 2020-10-29 LAB — HEPARIN LEVEL (UNFRACTIONATED)
Heparin Unfractionated: 0.25 IU/mL — ABNORMAL LOW (ref 0.30–0.70)
Heparin Unfractionated: 0.27 IU/mL — ABNORMAL LOW (ref 0.30–0.70)
Heparin Unfractionated: 0.32 IU/mL (ref 0.30–0.70)

## 2020-10-29 LAB — SODIUM, URINE, RANDOM: Sodium, Ur: 40 mmol/L

## 2020-10-29 LAB — CREATININE, URINE, RANDOM: Creatinine, Urine: 98.09 mg/dL

## 2020-10-29 MED ORDER — FREE WATER
400.0000 mL | Status: DC
  Administered 2020-10-29 – 2020-10-30 (×6): 400 mL

## 2020-10-29 MED ORDER — ALBUTEROL SULFATE (2.5 MG/3ML) 0.083% IN NEBU
2.5000 mg | INHALATION_SOLUTION | Freq: Four times a day (QID) | RESPIRATORY_TRACT | Status: DC
  Administered 2020-10-29 – 2020-11-04 (×23): 2.5 mg via RESPIRATORY_TRACT
  Filled 2020-10-29 (×23): qty 3

## 2020-10-29 MED ORDER — FREE WATER: 150.0000 mL | Status: DC

## 2020-10-29 MED ORDER — HEPARIN BOLUS VIA INFUSION
1500.0000 [IU] | Freq: Once | INTRAVENOUS | Status: AC
  Administered 2020-10-29: 1500 [IU] via INTRAVENOUS
  Filled 2020-10-29: qty 1500

## 2020-10-29 MED ORDER — ACETYLCYSTEINE 20 % IN SOLN: 1.0000 mL | Freq: Every day | RESPIRATORY_TRACT | Status: DC

## 2020-10-29 MED ORDER — ORAL CARE MOUTH RINSE
15.0000 mL | OROMUCOSAL | Status: DC
  Administered 2020-10-29 – 2020-11-12 (×26): 15 mL via OROMUCOSAL

## 2020-10-29 MED ORDER — FREE WATER
200.0000 mL | Status: DC
  Administered 2020-10-29: 200 mL

## 2020-10-29 MED ORDER — METOPROLOL TARTRATE 25 MG PO TABS
12.5000 mg | ORAL_TABLET | Freq: Two times a day (BID) | ORAL | Status: DC
  Administered 2020-10-29 – 2020-11-09 (×21): 12.5 mg
  Filled 2020-10-29 (×22): qty 1

## 2020-10-29 MED ORDER — ACETYLCYSTEINE 20 % IN SOLN
1.0000 mL | Freq: Four times a day (QID) | RESPIRATORY_TRACT | Status: DC
  Administered 2020-10-29 – 2020-11-01 (×13): 1 mL via RESPIRATORY_TRACT
  Filled 2020-10-29 (×10): qty 4

## 2020-10-29 NOTE — Progress Notes (Addendum)
ANTICOAGULATION CONSULT NOTE  Pharmacy Consult:  Heparin Indication: atrial fibrillation  No Known Allergies  Patient Measurements: Height: 5\' 7"  (170.2 cm) Weight: 126.5 kg (278 lb 14.1 oz) IBW/kg (Calculated) : 66.1 HEPARIN DW (KG): 103.8  Vital Signs: Temp: 100.2 F (37.9 C) (12/24 0400) Temp Source: Oral (12/24 0400) BP: 130/67 (12/24 1500) Pulse Rate: 86 (12/24 1500)  Labs: Recent Labs    10/27/20 0428 10/27/20 1058 10/27/20 2045 10/28/20 0444 10/28/20 1431 10/29/20 0428 10/29/20 1514  HGB 13.2  --   --  12.9*  --  12.9*  --   HCT 47.4  --   --  44.9  --  44.3  --   PLT 195  --   --  170  --  179  --   APTT  --  33  --   --   --   --   --   HEPARINUNFRC  --  0.32   < > 0.29* 0.39 0.25* 0.27*  CREATININE 3.50*  --   --  4.94*  --  4.83*  --    < > = values in this interval not displayed.    Estimated Creatinine Clearance: 22.3 mL/min (A) (by C-G formula based on SCr of 4.83 mg/dL (H)).  Assessment: Pharmacy consulted to dose heparin infusion for this 54 yo male with atrial fibrillation.  Patient was  taking apixaban PTA and was on treatment-dose Lovenox from 12-17 to 12-20. Heparin is being started due to patient's unstable renal function.   HL 0.27- slightly subtherapeutic  Goal of Therapy:  Heparin level 0.3-0.7 units/ml Monitor platelets by anticoagulation protocol: Yes   Plan:  Rebolus heparin 1500 units x 1 Increase heparin infusion to 1900 units/hr   HL in 6  Hours and daily while on heparin Monitor for signs and symptoms of bleeding

## 2020-10-29 NOTE — Progress Notes (Addendum)
Nutrition Follow-up  DOCUMENTATION CODES:   Morbid obesity  INTERVENTION:  Patient has NGT with Vital High Protein infusing @ 40 ml/hr.   If patient unable to resume usual diet-recommend add ProSource TF- 90 ml -TID via NGT in order to meet 100% protein goal.  Free water flushes increased: 400 ml every 4 hrs.  Tube feeding regimen currently providing 960 kcal, 84 grams protein, and 836 ml H2O.  Current tube feeds are meeting 65% energy and 60% protein needs, and 100% est fluid needs met.  NUTRITION DIAGNOSIS:   Inadequate oral intake related to acute illness (respiratory failure) as evidenced by NPO status.  -addressed with tube feeding- (60-65% of nutrition needs met)  GOAL:   Provide needs based on ASPEN/SCCM guidelines   MONITOR:   Vent status,Labs,TF tolerance,I & O's,Skin  REASON FOR ASSESSMENT:   Ventilator    ASSESSMENT:   Patient is a 54 yo male with history of CKD, CHF. GOUT, Hypertension and Tracheostomy dependence. Chest x-ray findings- left side pleural effusion.  Patient is currently intubated on ventilator support. Sodium trending up free water started. Recommend add protein supplement to meet est needs. Patient weight is down 20 kg from admission.   Family at beside today. Patient eyes are open.   Discussed with nursing. Patient is tolerating tube feeds and is progressing overall per report. May be ready to wean by tomorrow?   Intake/Output Summary (Last 24 hours) at 10/29/2020 1145 Last data filed at 10/29/2020 0741 Gross per 24 hour  Intake 1166.69 ml  Output 1950 ml  Net -783.31 ml    MV: 12.7  L/min Temp (24hrs), Avg:99.8 F (37.7 C), Min:98.8 F (37.1 C), Max:100.3 F (37.9 C)  Precedex-4 mcg/ml  Medications reviewed and include: protonix, amiodarone 16.67 ml/hr   Labs reviewed:  BMP Latest Ref Rng & Units 10/29/2020 10/28/2020 10/27/2020  Glucose 70 - 99 mg/dL 138(H) 139(H) 129(H)  BUN 6 - 20 mg/dL 108(H) 95(H) 74(H)  Creatinine  0.61 - 1.24 mg/dL 4.83(H) 4.94(H) 3.50(H)  Sodium 135 - 145 mmol/L 153(H) 151(H) 149(H)  Potassium 3.5 - 5.1 mmol/L 4.5 4.9 5.1  Chloride 98 - 111 mmol/L 107 104 103  CO2 22 - 32 mmol/L 34(H) 33(H) 36(H)  Calcium 8.9 - 10.3 mg/dL 9.1 8.9 8.9      Diet Order:   Diet Order            Diet NPO time specified  Diet effective midnight                 EDUCATION NEEDS:   Not appropriate for education at this time  Skin:  Skin Assessment: Reviewed RN Assessment  Last BM:  12/23  Height:   Ht Readings from Last 1 Encounters:  10/29/20 '5\' 7"'  (1.702 m)    Weight:   Wt Readings from Last 1 Encounters:  10/29/20 126.5 kg  Admit weight -146.6 kg  Ideal Body Weight:   70 kg  BMI:  Body mass index is 43.68 kg/m.  Estimated Nutritional Needs:   Kcal:  8421-0312  Protein:  140-154  Fluid:  < 2 liters   Colman Cater MS,RD,CSG,LDN Pager: Shea Evans

## 2020-10-29 NOTE — Progress Notes (Signed)
Patient ID: Jack Cox, male   DOB: [birth year], 54 y.o.   MRN: [id number] S: No events overnight.  Still with good UOP and diuresis without diuretics O:BP (!) 153/77   Pulse 78   Temp 100.2 F (37.9 C) (Oral)   Resp (!) 23   Ht _0  (1.702 m)   Wt 126.5 kg   SpO2 94%   BMI 43.68 kg/m   Intake/Output Summary (Last 24 hours) at 10/29/2020 1053 Last data filed at 10/29/2020 0741 Gross per 24 hour  Intake 1166.69 ml  Output 1950 ml  Net -783.31 ml   Intake/Output: I/O last 3 completed shifts: In: 330.7 [I.V.:230.7; IV Piggyback:100] Out: 2800 [Urine:2800]  Intake/Output this shift:  Total I/O In: 1166.7 [I.V.:1066.7; IV Piggyback:100] Out: -  Weight change: -2.4 kg Gen: obese AAM on vent via trach, somnolent but in NAD CVS: IRR no rub Resp: ventilated BS bilaterally Abd: obese, +BS, soft Ext: 1+ edema of all extremities  Recent Labs  Lab 10/23/20 0358 10/24/20 0402 10/25/20 0618 10/26/20 0436 10/27/20 0428 10/28/20 0444 10/29/20 0428  NA 141 142 145 147* 149* 151* 153*  K 3.8 4.0 3.9 4.1 5.1 4.9 4.5  CL 99 100 98 99 103 104 107  CO2 30 32 37* 35* 36* 33* 34*  GLUCOSE 74 114* 120* 124* 129* 139* 138*  BUN 75* 72* 69* 60* 74* 95* 108*  CREATININE 3.46* 3.22* 2.84* 2.40* 3.50* 4.94* 4.83*  ALBUMIN  --  2.6* 3.0* 3.1* 2.7* 2.5* 2.5*  CALCIUM 8.6* 8.2* 8.8* 9.3 8.9 8.9 9.1  PHOS 5.1*  --  4.6 3.8 7.1* 5.5* 4.0  AST  --  29 27  --   --   --   --   ALT  --  16 16  --   --   --   --    Liver Function Tests: Recent Labs  Lab 10/24/20 0402 10/25/20 0618 10/26/20 0436 10/27/20 0428 10/28/20 0444 10/29/20 0428  AST 29 27  --   --   --   --   ALT 16 16  --   --   --   --   ALKPHOS 71 75  --   --   --   --   BILITOT 0.6 0.8  --   --   --   --   PROT 6.1* 6.9  --   --   --   --   ALBUMIN 2.6* 3.0*   < > 2.7* 2.5* 2.5*   < > = values in this interval not displayed.   No results for input(s): LIPASE, AMYLASE in the last 168 hours. No results for input(s):  AMMONIA in the last 168 hours. CBC: Recent Labs  Lab 10/25/20 0618 10/26/20 0436 10/27/20 0428 10/28/20 0444 10/29/20 0428  WBC 5.8 6.4 9.0 8.6 8.3  HGB 12.6* 14.4 13.2 12.9* 12.9*  HCT 42.1 49.3 47.4 44.9 44.3  MCV 100.2* 99.4 105.3* 102.0* 101.4*  PLT 196 194 195 170 179   Cardiac Enzymes: No results for input(s): CKTOTAL, CKMB, CKMBINDEX, TROPONINI in the last 168 hours. CBG: Recent Labs  Lab 10/28/20 1631 10/28/20 2020 10/29/20 0035 10/29/20 0420 10/29/20 0819  GLUCAP 114* 124* 137* 135* 120*    Iron Studies: No results for input(s): IRON, TIBC, TRANSFERRIN, FERRITIN in the last 72 hours. Studies/Results: US RENAL  Result Date: 10/28/2020 CLINICAL DATA:  Chronic kidney disease.  Acute kidney injury. EXAM: RENAL / URINARY TRACT ULTRASOUND COMPLETE COMPARISON:  CT 06/12/2018 FINDINGS: Right  Kidney: Renal measurements: 11.0 x 6.4 x 5.2 cm = volume: 190 mL. Increased echogenicity. No focal lesion or hydronephrosis. Left Kidney: Renal measurements: 11.2 x 7.6 x 6.2 cm = volume: 276 mL. Increased echogenicity. No focal lesion or hydronephrosis. Bladder: Foley catheter within the bladder. Other: None. IMPRESSION: Slightly enlarged kidneys showing increased echogenicity. Findings could be due to chronic diabetic glomerulonephropathy or acute nephritis. No obstruction or focal lesion. Electronically Signed   By: Nelson Chimes M.D.   On: 10/28/2020 14:30   . acetylcysteine  1 mL Nebulization QID  . albuterol  2.5 mg Nebulization QID  . chlorhexidine gluconate (MEDLINE KIT)  15 mL Mouth Rinse BID  . Chlorhexidine Gluconate Cloth  6 each Topical Daily  . free water  200 mL Per Tube Q4H  . mouth rinse  15 mL Mouth Rinse 10 times per day  . pantoprazole sodium  40 mg Per Tube Q1200  . sodium chloride flush  3 mL Intravenous Q12H  . sodium chloride HYPERTONIC  4 mL Nebulization BID    BMET    Component Value Date/Time   NA 153 (H) 10/29/2020 0428   K 4.5 10/29/2020 0428   CL  107 10/29/2020 0428   CO2 34 (H) 10/29/2020 0428   GLUCOSE 138 (H) 10/29/2020 0428   BUN 108 (H) 10/29/2020 0428   CREATININE 4.83 (H) 10/29/2020 0428   CALCIUM 9.1 10/29/2020 0428   GFRNONAA 14 (L) 10/29/2020 0428   CBC    Component Value Date/Time   WBC 8.3 10/29/2020 0428   RBC 4.37 10/29/2020 0428   HGB 12.9 (L) 10/29/2020 0428   HCT 44.3 10/29/2020 0428   PLT 179 10/29/2020 0428   MCV 101.4 (H) 10/29/2020 0428   MCH 29.5 10/29/2020 0428   MCHC 29.1 (L) 10/29/2020 0428   RDW 14.2 10/29/2020 0428   LYMPHSABS 0.9 10/20/2020 1217   MONOABS 0.8 10/20/2020 1217   EOSABS 0.1 10/20/2020 1217   BASOSABS 0.1 10/20/2020 1217    Assessment/Plan:  1. AKI/CKD stage IV- presumably cardiorenal syndrome but improved initially only to worsen over the past 48 hours.  Likely progressed to ischemic ATN in setting of aggressive diuresis.  He continues to make urine despite holding diuretics.  No contrasted studies.  He does not have any acute indications for dialysis at this time and any decision to undertake chronic dialysis will need to be after extensive discussions with the patient/family given his current comorbidities  1. Continue to hold diuretics and follow.   2. Renal US without obstruction 3. BUN/Cr slowly improving off of diuretics  4. Given IV albumin to help with 3rd spacing and had brisk UOP 5. Avoid nephrotoxic agents such as NSAIDs (was on meloxicam at home), Cox-II I's, IV contrast, and phosphate containing bowel preparations 6. Continue to follow UOP and daily Scr. 7. No indication for dialysis at this time.  2. Acute exacerbation of diastolic CHF/cor pulmonale- he remains net negative even off of IV lasix. 3. 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 4. HTN- stable 5. Hypernatremia- increase free water to 400 ml q4 and follow 6. Atrial flutter- on eliquis 7. Fever- tm 100.2 this morning.  On IV cefepime per  primary 8. CKD stage IIIb/IV- normally followed by Dr. 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

## 2020-10-29 NOTE — Progress Notes (Signed)
PROGRESS NOTE  Jack Cox XVQ:[id number] DOB: July 17, 1966 DOA: 10/20/2020 PCP: [name], MD   Brief History: 54 y.o.malewith medical history ofdiastolic CHF, atrial flutter, obstructive sleep apnea, hypertension, right bundle branch block presenting with shortness of breath for the last 2 to 3 days. Unfortunately, the patient is encephalopathic at the time of my evaluation; therefore, history is limited. Apparently, the patient went to see his nephrologist earlier on 10/20/2020. He was noted to have oxygen saturation in the 60s. As result, he was sent to the emergency department for further evaluation. Initially, the patient was awake and conversant with oxygen saturation in the low 70s. He was placed on a nonrebreather after which the patient became obtunded. VBG showed pH 7.1 66/97/106 on 80%. Subsequently, the patient was placed on the ventilator with PRVC,PEEP of 5, FiO2 70%. After approximately 10 to 15 minutes on the ventilator, the patient remained somnolent, but awakened and was able to tell me his name. He did state that he has not been taking his torsemide as directed. In addition, the patient has been sleeping in a recliner for an unclear duration of time secondary to shortness of breath. At baseline, the patient has a size 4 noncuffed tracheostomy. He is on supplemental oxygen 2.5 L during the day and 4 L at nighttime. There is no reports of nausea, vomiting, diarrhea, chest pain, abdominal pain. In the emergency department, the patient was afebrile and hemodynamically stable with oxygen saturation 93-94% on the ventilator with FiO2 70%. Chest x-ray showed left-sided pleural effusion with increased interstitial markings. BNP 790. PCT 0.12. BMP showed a serum creatinine 2.43. LFTs were unremarkable. WBC 8.1, hemoglobin 14.5, platelets 253,000. The patient was given ceftriaxone and azithromycin. He was also given furosemide 80 mg IV x1. COVID-19  RT-PCR negative.  He was continued on IV lasix 80 mg bid with uptrending of his serum creatinine.  Renal was consulted to assist.   Assessment/Plan: Acute on chronic respiratory failure with hypoxia and hypercarbia -Secondary to CHF in the setting of OSA -remainson ventilator -10/26/20 personally reviewed CXR--increased interstitial markings--?retrocardiac opacity -Vent settings PC16,PEEP [id number]%>>>50% -Pulmonary consultappreciated -PCT 0.12>>0.61 -having mucus plugging--needs aggressive suctioning -12/17 CXR--white out of left lung -continue mucomyst and albuterol -10/26/20-bronchoscopy--Thick inspissated mucus in proximal airways & BL lower lobes, removed with suctioning - hypertonic saline nebulizer treatments started by pulmonology 12/22  Acute on chronic diastolic CHF/Cor Pulmonale -06/25/2019 echo EF 55%, trivial TR -12/16/21echo--EF 60-65%, no WMA, RV pressure overload, mild TR -restarted IV lasix with albumin per renal with excellent response -pt remains clinically fluid overloaded -Daily BMP -Accurate I's and O's--NEG 13L -Daily weights I/O last 3 completed shifts: In: 330.7 [I.V.:230.7; IV Piggyback:100] Out: 2800 [Urine:2800] Total I/O In: 1166.7 [I.V.:1066.7; IV Piggyback:100] Out: -   Atrial flutter -restart apixaban 10/27/20 if no further procedures planned -rate controlledbut had an episode 3-4 hours 12/18-12/19 evening -resume low dose metoprolol 12.5 mg BID per tube   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  -Monitor with diuresis -consult nephrology--appreciated-->albumin+lasix bid per renal  FEN -had mild hypoglycemia -NG placed 12/17 -switchedto Vital High Protein and increase rate to 40 cc/hr-->hypoglycemia improved  Hypernatremia - added free water to regimen, recheck in AM.   Fever - improving with pneumonia management -101.0  fever evening 12/18 and 100.7 12/21 -blood cultures x 2--neg to date -UA and urine culture--no significant pyuria -12/16  trach aspirate-->flora -10/26/20-->BAL sent for culture -start IV cefepime due to fevers, CXR findings  Essential hypertension -Anticipate improvement with diuresis -metoprolol low dose  OSA -The patient has a tracheostomy placed in 2001 -He has a size 4 cuffless trach  Right bundle branch block -This has been chronic with review of the medical records  Morbid obesity -BMI 52.94 -lifestyle modification  GOC -palliative following -currently full scope of care   Status is: Inpatient  Remains inpatient appropriate because:IV treatments appropriate due to intensity of illness or inability to take PO   Dispo: The patient is from:Home Anticipated d/c is WN:UUVO Anticipated d/c date is: >3 days Patient currently is not medically stable to d/c.   Family Communication:brotherupdated bedside 12/20-21  Consultants:pulm, renal  Code Status: FULL  DVT Prophylaxis: IV heparin infusion for full anticoagulation   Procedures: As Listed in Progress Note Above  Antibiotics: Cefepime 12/21>>  Subjective: Patient complains of dry mouth this morning and asked to be bathed   Objective: Vitals:   10/29/20 1200 10/29/20 1300 10/29/20 1342 10/29/20 1400  BP: 139/80 135/72  (!) 130/49  Pulse: 79 79  80  Resp: (!) 23 (!) 24  (!) 23  Temp:      TempSrc:      SpO2: 95% 93% 93% 95%  Weight:      Height:        Intake/Output Summary (Last 24 hours) at 10/29/2020 1525 Last data filed at 10/29/2020 0741 Gross per 24 hour  Intake 1166.69 ml  Output 1950 ml  Net -783.31 ml   Weight change: -2.4 kg Exam:  General - pt is on vent but more awake.  Neck - supple, no JVD.  Lungs: poor air movement bilateral. No increased WOB. Expiratory wheeze heard on LUL.  CV - normal s1,s2 sounds. No MRG.   Abd - obese, soft, no masses palpated Ext - 1+ pitting edema BLEs.  Neuro - he is more awake today.   Data Reviewed: I have personally reviewed following labs and imaging studies Basic Metabolic Panel: Recent Labs  Lab 10/23/20 0358 10/24/20 0402 10/25/20 0618 10/26/20 0436 10/27/20 0428 10/28/20 0444 10/29/20 0428  NA 141   < > 145 147* 149* 151* 153*  K 3.8   < > 3.9 4.1 5.1 4.9 4.5  CL 99   < > 98 99 103 104 107  CO2 30   < > 37* 35* 36* 33* 34*  GLUCOSE 74   < > 120* 124* 129* 139* 138*  BUN 75*   < > 69* 60* 74* 95* 108*  CREATININE 3.46*   < > 2.84* 2.40* 3.50* 4.94* 4.83*  CALCIUM 8.6*   < > 8.8* 9.3 8.9 8.9 9.1  MG 2.0  --   --  1.9  --   --   --   PHOS 5.1*  --  4.6 3.8 7.1* 5.5* 4.0   < > = values in this interval not displayed.   Liver Function Tests: Recent Labs  Lab 10/24/20 0402 10/25/20 0618 10/26/20 0436 10/27/20 0428 10/28/20 0444 10/29/20 0428  AST 29 27  --   --   --   --   ALT 16 16  --   --   --   --   ALKPHOS 71 75  --   --   --   --   BILITOT 0.6 0.8  --   --   --   --   PROT 6.1* 6.9  --   --   --   --  ALBUMIN 2.6* 3.0* 3.1* 2.7* 2.5* 2.5*   No results for input(s): LIPASE, AMYLASE in the last 168 hours. No results for input(s): AMMONIA in the last 168 hours. Coagulation Profile: No results for input(s): INR, PROTIME in the last 168 hours. CBC: Recent Labs  Lab 10/25/20 0618 10/26/20 0436 10/27/20 0428 10/28/20 0444 10/29/20 0428  WBC 5.8 6.4 9.0 8.6 8.3  HGB 12.6* 14.4 13.2 12.9* 12.9*  HCT 42.1 49.3 47.4 44.9 44.3  MCV 100.2* 99.4 105.3* 102.0* 101.4*  PLT 196 194 195 170 179   Cardiac Enzymes: No results for input(s): CKTOTAL, CKMB, CKMBINDEX, TROPONINI in the last 168 hours. BNP: Invalid input(s): POCBNP CBG: Recent Labs  Lab 10/28/20 2020 10/29/20 0035 10/29/20 0420 10/29/20 0819 10/29/20 1208  GLUCAP 124* 137* 135* 120* 138*   HbA1C: No results for input(s): HGBA1C in the last 72 hours. Urine analysis:     Component Value Date/Time   COLORURINE YELLOW 10/28/2020 0429   APPEARANCEUR HAZY (A) 10/28/2020 0429   LABSPEC 1.014 10/28/2020 0429   PHURINE 5.0 10/28/2020 0429   GLUCOSEU NEGATIVE 10/28/2020 0429   HGBUR SMALL (A) 10/28/2020 0429   BILIRUBINUR NEGATIVE 10/28/2020 0429   KETONESUR NEGATIVE 10/28/2020 0429   PROTEINUR 100 (A) 10/28/2020 0429   NITRITE NEGATIVE 10/28/2020 0429   LEUKOCYTESUR NEGATIVE 10/28/2020 0429    Recent Results (from the past 240 hour(s))  Resp Panel by RT-PCR (Flu A&B, Covid) Nasopharyngeal Swab     Status: None   Collection Time: 10/20/20 11:43 AM   Specimen: Nasopharyngeal Swab; Nasopharyngeal(NP) swabs in vial transport medium  Result Value Ref Range Status   SARS Coronavirus 2 by RT PCR NEGATIVE NEGATIVE Final    Comment: (NOTE) SARS-CoV-2 target nucleic acids are NOT DETECTED.  The SARS-CoV-2 RNA is generally detectable in upper respiratory specimens during the acute phase of infection. The lowest concentration of SARS-CoV-2 viral copies this assay can detect is 138 copies/mL. A negative result does not preclude SARS-Cov-2 infection and should not be used as the sole basis for treatment or other patient management decisions. A negative result may occur with  improper specimen collection/handling, submission of specimen other than nasopharyngeal swab, presence of viral mutation(s) within the areas targeted by this assay, and inadequate number of viral copies(<138 copies/mL). A negative result must be combined with clinical observations, patient history, and epidemiological information. The expected result is Negative.  Fact Sheet for Patients:  EntrepreneurPulse.com.au  Fact Sheet for Healthcare Providers:  IncredibleEmployment.be  This test is no t yet approved or cleared by the [place] FDA and  has been authorized for detection and/or diagnosis of SARS-CoV-2 by FDA under an Emergency Use  Authorization (EUA). This EUA will remain  in effect (meaning this test can be used) for the duration of the COVID-19 declaration under Section 564(b)(1) of the Act, 21 U.S.C.section 360bbb-3(b)(1), unless the authorization is terminated  or revoked sooner.       Influenza A by PCR NEGATIVE NEGATIVE Final   Influenza B by PCR NEGATIVE NEGATIVE Final    Comment: (NOTE) The Xpert Xpress SARS-CoV-2/FLU/RSV plus assay is intended as an aid in the diagnosis of influenza from Nasopharyngeal swab specimens and should not be used as a sole basis for treatment. Nasal washings and aspirates are unacceptable for Xpert Xpress SARS-CoV-2/FLU/RSV testing.  Fact Sheet for Patients: EntrepreneurPulse.com.au  Fact Sheet for Healthcare Providers: IncredibleEmployment.be  This test is not yet approved or cleared by the [place] FDA and has been authorized for detection and/or  diagnosis of SARS-CoV-2 by FDA under an Emergency Use Authorization (EUA). This EUA will remain in effect (meaning this test can be used) for the duration of the COVID-19 declaration under Section 564(b)(1) of the Act, 21 U.S.C. section 360bbb-3(b)(1), unless the authorization is terminated or revoked.  Performed at [facility], [address] [place], [place] [address]   Blood culture (routine x 2)     Status: None   Collection Time: 10/20/20 12:18 PM   Specimen: BLOOD  Result Value Ref Range Status   Specimen Description BLOOD  Final   Special Requests NONE  Final   Culture   Final    NO GROWTH 5 DAYS Performed at [facility], [address] [place], [place] [address]    Report Status 10/25/2020 FINAL  Final  Blood culture (routine x 2)     Status: None   Collection Time: 10/20/20 12:59 PM   Specimen: BLOOD RIGHT ARM  Result Value Ref Range Status   Specimen Description BLOOD RIGHT ARM  Final   Special Requests   Final    BOTTLES DRAWN AEROBIC AND ANAEROBIC Blood Culture  adequate volume   Culture   Final    NO GROWTH 5 DAYS Performed at [facility], [address] [place], [place] [address]    Report Status 10/25/2020 FINAL  Final  Urine culture     Status: None   Collection Time: 10/20/20  3:42 PM   Specimen: Urine, Catheterized  Result Value Ref Range Status   Specimen Description   Final    URINE, CATHETERIZED Performed at [facility], [address] [place], [place] [address]    Special Requests   Final    NONE Performed at [facility], [address], [place], [place] [address]    Culture   Final    NO GROWTH Performed at [facility], [place] [address], [place], [place] [address]    Report Status 10/21/2020 FINAL  Final  MRSA PCR Screening     Status: None   Collection Time: 10/20/20  6:42 PM   Specimen: Nasal Mucosa; Nasopharyngeal  Result Value Ref Range Status   MRSA by PCR NEGATIVE NEGATIVE Final    Comment:        The GeneXpert MRSA Assay (FDA approved for NASAL specimens only), is one component of a comprehensive MRSA colonization surveillance program. It is not intended to diagnose MRSA infection nor to guide or monitor treatment for MRSA infections. Performed at [facility], [address], [place], [place] [address]   Culture, respiratory (non-expectorated)     Status: None   Collection Time: 10/21/20  8:20 AM   Specimen: Tracheal Aspirate; Respiratory  Result Value Ref Range Status   Specimen Description   Final    TRACHEAL ASPIRATE Performed at [facility], [address], [place], [place] [address]    Special Requests   Final    NONE Performed at [facility], [address] [place], [place] [address]    Gram Stain   Final    FEW WBC PRESENT, PREDOMINANTLY MONONUCLEAR NO ORGANISMS SEEN    Culture   Final    RARE Normal respiratory flora-no Staph aureus or Pseudomonas seen Performed at [facility] [address] [place], [place] [address]    Report Status 10/23/2020 FINAL  Final  Culture,  Urine     Status: None   Collection Time: 10/24/20 12:39 PM   Specimen: Urine, Catheterized  Result Value Ref Range Status  Specimen Description   Final    URINE, CATHETERIZED Performed at [facility], [address], [place], [place] [address]    Special Requests   Final    NONE Performed at [facility], [address] [place], [place] [address]    Culture   Final    NO GROWTH Performed at [facility], [place] [address] [place], [place] [address]    Report Status 10/25/2020 FINAL  Final  Culture, blood (Routine X 2) w Reflex to ID Panel     Status: None   Collection Time: 10/24/20  1:34 PM   Specimen: BLOOD RIGHT HAND  Result Value Ref Range Status   Specimen Description   Final    BLOOD RIGHT HAND BOTTLES DRAWN AEROBIC AND ANAEROBIC   Special Requests Blood Culture adequate volume  Final   Culture   Final    NO GROWTH 5 DAYS Performed at [facility], [address], [place], [place] [address]    Report Status 10/29/2020 FINAL  Final  Culture, blood (Routine X 2) w Reflex to ID Panel     Status: None   Collection Time: 10/24/20  1:44 PM   Specimen: BLOOD LEFT WRIST  Result Value Ref Range Status   Specimen Description   Final    BLOOD LEFT WRIST BOTTLES DRAWN AEROBIC AND ANAEROBIC   Special Requests Blood Culture adequate volume  Final   Culture   Final    NO GROWTH 5 DAYS Performed at [facility], [address], [place], [place] [address]    Report Status 10/29/2020 FINAL  Final  Culture, respiratory (non-expectorated)     Status: None   Collection Time: 10/26/20  6:07 PM   Specimen: Tracheal Aspirate; Respiratory  Result Value Ref Range Status   Specimen Description   Final    TRACHEAL ASPIRATE Performed at [facility], [address], [place], [place] [address]    Special Requests   Final    Normal Performed at [facility], [address], [place], [place] [address]    Gram Stain   Final    FEW WBC PRESENT, PREDOMINANTLY PMN MODERATE GRAM POSITIVE  RODS FEW GRAM POSITIVE COCCI FEW GRAM NEGATIVE RODS    Culture   Final    FEW Normal respiratory flora-no Staph aureus or Pseudomonas seen Performed at [facility], [place] [address], [place],  [address]    Report Status 10/29/2020 FINAL  Final     Scheduled Meds: . acetylcysteine  1 mL Nebulization 6 X Daily  . albuterol  2.5 mg Nebulization Q6H  . chlorhexidine gluconate (MEDLINE KIT)  15 mL Mouth Rinse BID  . Chlorhexidine Gluconate Cloth  6 each Topical Daily  . free water  400 mL Per Tube Q4H  . mouth rinse  15 mL Mouth Rinse 2 times per day  . pantoprazole sodium  40 mg Per Tube Q1200  . sodium chloride flush  3 mL Intravenous Q12H  . sodium chloride HYPERTONIC  4 mL Nebulization BID   Continuous Infusions: . sodium chloride    . sodium chloride    . amiodarone    . ceFEPime (MAXIPIME) IV Stopped (10/28/20 2314)  . dexmedetomidine (PRECEDEX) IV infusion 0.8 mcg/kg/hr (10/29/20 1435)  . feeding supplement (VITAL HIGH PROTEIN) 1,000 mL (10/27/20 1604)  . heparin 1,750 Units/hr (10/29/20 0845)   Procedures/Studies: US RENAL  Result Date: 10/28/2020 CLINICAL DATA:  Chronic kidney disease.  Acute kidney injury. EXAM: RENAL / URINARY TRACT ULTRASOUND COMPLETE  COMPARISON:  CT 06/12/2018 FINDINGS: Right Kidney: Renal measurements: 11.0 x 6.4 x 5.2 cm = volume: 190 mL. Increased echogenicity. No focal lesion or hydronephrosis. Left Kidney: Renal measurements: 11.2 x 7.6 x 6.2 cm = volume: 276 mL. Increased echogenicity. No focal lesion or hydronephrosis. Bladder: Foley catheter within the bladder. Other: None. IMPRESSION: Slightly enlarged kidneys showing increased echogenicity. Findings could be due to chronic diabetic glomerulonephropathy or acute nephritis. No obstruction or focal lesion. Electronically Signed   By: Nelson Chimes M.D.   On: 10/28/2020 14:30   DG Chest Port 1 View  Result Date: 10/27/2020 CLINICAL DATA:  Acute respiratory failure EXAM: PORTABLE CHEST  1 VIEW COMPARISON:  Yesterday FINDINGS: Enteric and tracheostomy tubes remain in good position. Cardiomegaly. Interstitial opacity which could be bronchitic or congestive. There is improved aeration at the right base. Negative for pneumothorax. IMPRESSION: 1. Improved aeration. 2. Bronchitic or congestive interstitial opacity. Electronically Signed   By: Monte Fantasia M.D.   On: 10/27/2020 07:46   DG Chest Port 1 View  Result Date: 10/26/2020 CLINICAL DATA:  Shortness of breath for 2-3 days EXAM: PORTABLE CHEST 1 VIEW COMPARISON:  Three days ago FINDINGS: Progressive hazy appearance of the right mid to lower chest. Dense retrocardiac opacity persists. An enteric tube and tracheostomy tube remain in place. Cardiomegaly and vascular pedicle widening. IMPRESSION: Pleural fluid/pulmonary opacification at the bases which has progressed from 3 days ago. Electronically Signed   By: Monte Fantasia M.D.   On: 10/26/2020 06:02   DG CHEST PORT 1 VIEW  Result Date: 10/23/2020 CLINICAL DATA:  Shortness of breath. EXAM: PORTABLE CHEST 1 VIEW COMPARISON:  October 22, 2020 FINDINGS: The near complete opacification of the left hemithorax seen on the comparison chest x-ray has resolved. Persistent bibasilar opacities are likely atelectasis. No pneumothorax. A tracheostomy tube is in good position. An NG tube terminates in the stomach. No other acute abnormalities. IMPRESSION: 1. Support apparatus as above. 2. Bibasilar atelectasis and a possible small left effusion. Improved aeration of the left lung. Electronically Signed   By: Dorise Bullion III M.D   On: 10/23/2020 14:57   DG Chest Port 1 View  Result Date: 10/22/2020 CLINICAL DATA:  Acute respiratory failure. EXAM: PORTABLE CHEST 1 VIEW COMPARISON:  10/20/2020 FINDINGS: Tracheostomy tube overlies the airway. The cardiac silhouette is enlarged. There is persistent mild central pulmonary vascular congestion without overt edema. Airspace opacities in the left  greater than right mid and lower lungs have slightly improved. Small pleural effusions are questioned. No pneumothorax is identified. IMPRESSION: Slight improvement of bilateral airspace opacities which could reflect pneumonia or atelectasis. Electronically Signed   By: [name] M.D.   On: 10/22/2020 09:16   DG Chest Port 1 View  Result Date: 10/20/2020 CLINICAL DATA:  Questionable sepsis EXAM: PORTABLE CHEST 1 VIEW COMPARISON:  Chest x-ray 06/12/2018 report without images. FINDINGS: Tracheostomy tube terminates approximately 7.5 cm above the carina. The heart size and mediastinal contours are not well visualized. Bilateral hilar vasculature prominence. Aortic arch calcifications. Silhouetting off of the left hemidiaphragm and left cardiac border. Partial silhouetting off the right cardiac border and right hemidiaphragm. Increased interstitial markings. Likely at least trace to small volume left pleural effusion. No pneumothorax. No acute osseous abnormality. IMPRESSION: 1. Left lung opacity and likely at least trace to small volume left pleural effusion with silhouetting off of the left diaphragm and heart border. Concern for infection/inflammation. 2. Right lower lobe and middle lobe airspace opacity could represent a combination  of atelectasis, infection, inflammation. 3. At least mild pulmonary edema. 4. Consider chest x-ray PA and lateral view for further evaluation. Electronically Signed   By: Iven Finn M.D.   On: 10/20/2020 12:26   DG Chest Port 1V same Day  Result Date: 10/22/2020 CLINICAL DATA:  Status post NG tube placement. EXAM: PORTABLE CHEST 1 VIEW COMPARISON:  Single-view of the chest earlier today. FINDINGS: The upper chest is off the margin of the film. NG tube is looped in the upper trachea or esophagus. Visualized left chest is completely whited out on this study. IMPRESSION: NG tube is looped in the upper esophagus or trachea. Complete whiteout of the left chest is new since  the study earlier today could be due to mucous plugging and left lung collapse or rapidly developing pleural effusion and airspace disease. Electronically Signed   By: [name] M.D.   On: 10/22/2020 15:16   DG Abd Portable 1V  Result Date: 10/22/2020 CLINICAL DATA:  Nasogastric tube placement EXAM: PORTABLE ABDOMEN - 1 VIEW COMPARISON:  Portable exam 1613 hours compared to 1523 hours FINDINGS: Tip of nasogastric tube projects over distal gastric antrum, consider withdrawal 5 cm. Bowel gas pattern normal. Opacification of the inferior LEFT hemithorax again seen. IMPRESSION: Tip of nasogastric tube projects over distal gastric antrum. Consider withdrawal of nasogastric tube 5 cm. Electronically Signed   By: [name] M.D.   On: 10/22/2020 16:36   DG Abd Portable 1V  Result Date: 10/22/2020 CLINICAL DATA:  Nasogastric tube placement EXAM: PORTABLE ABDOMEN - 1 VIEW COMPARISON:  None. FINDINGS: Nasogastric tube extends into the gastric cardia where it loops upon itself with the tip at the junction of the mid and distal thirds of the esophagus. There is no bowel dilatation or air-fluid level to suggest bowel obstruction. No free air. There is consolidation left lower lobe. IMPRESSION: Nasogastric tube extends into the stomach where it loops upon itself with the tip at the junction of the mid and distal thirds of the esophagus. No bowel obstruction or free air evident. Consolidation left lower lobe. These results will be called to the ordering clinician or representative by the Radiologist Assistant, and communication documented in the PACS or [employer]. Electronically Signed   By: Lowella Grip III M.D.   On: 10/22/2020 15:34   ECHOCARDIOGRAM COMPLETE  Result Date: 10/21/2020    ECHOCARDIOGRAM REPORT   Patient Name:   Jack Cox Date of Exam: 10/21/2020 Medical Rec #:  [id number]        Height:       68.0 in Accession #:    [id number]       Weight:       325.8 lb Date of Birth:   23-Jul-1966        BSA:          2.515 m Patient Age:    34 years         BP:           112/61 mmHg Patient Gender: M                HR:           51 bpm. Exam Location:  [facility] Procedure: 2D Echo Indications:    CHF-Acute Diastolic Q33.35  History:        Patient has no prior history of Echocardiogram examinations.                 CHF; Risk Factors:Non-Smoker. Acute on  chronic respiratory                 failure with hypoxia.  Sonographer:    Leavy Cella RDCS (AE) Referring Phys: [phone] DAVID TAT IMPRESSIONS  1. Left ventricular ejection fraction, by estimation, is 60 to 65%. The left ventricle has normal function. The left ventricle has no regional wall motion abnormalities. There is severe left ventricular hypertrophy. Left ventricular diastolic parameters  are indeterminate. There is the interventricular septum is flattened in systole and diastole, consistent with right ventricular pressure and volume overload.  2. Right ventricular systolic function is mildly reduced. The right ventricular size is mildly enlarged. Tricuspid regurgitation signal is inadequate for assessing PA pressure.  3. Right atrial size was upper normal.  4. The pericardial effusion is posterior to the left ventricle.  5. The mitral valve is grossly normal. Trivial mitral valve regurgitation.  6. The aortic valve is tricuspid. Aortic valve regurgitation is not visualized.  7. The inferior vena cava is normal in size with greater than 50% respiratory variability, suggesting right atrial pressure of 3 mmHg. FINDINGS  Left Ventricle: Left ventricular ejection fraction, by estimation, is 60 to 65%. The left ventricle has normal function. The left ventricle has no regional wall motion abnormalities. The left ventricular internal cavity size was normal in size. There is  severe left ventricular hypertrophy. The interventricular septum is flattened in systole and diastole, consistent with right ventricular pressure and volume overload. Left  ventricular diastolic parameters are indeterminate. Right Ventricle: The right ventricular size is mildly enlarged. No increase in right ventricular wall thickness. Right ventricular systolic function is mildly reduced. Tricuspid regurgitation signal is inadequate for assessing PA pressure. Left Atrium: Left atrial size was normal in size. Right Atrium: Right atrial size was upper normal. Pericardium: Trivial pericardial effusion is present. The pericardial effusion is posterior to the left ventricle. Mitral Valve: The mitral valve is grossly normal. Trivial mitral valve regurgitation. Tricuspid Valve: The tricuspid valve is grossly normal. Tricuspid valve regurgitation is mild. Aortic Valve: The aortic valve is tricuspid. There is moderate aortic valve annular calcification. Aortic valve regurgitation is not visualized. Pulmonic Valve: The pulmonic valve was grossly normal. Pulmonic valve regurgitation is trivial. Aorta: The aortic root is normal in size and structure. Venous: The inferior vena cava is normal in size with greater than 50% respiratory variability, suggesting right atrial pressure of 3 mmHg. IAS/Shunts: No atrial level shunt detected by color flow Doppler.  LEFT VENTRICLE PLAX 2D LVIDd:         4.66 cm  Diastology LVIDs:         2.03 cm  LV e' medial:    5.87 cm/s LV PW:         1.76 cm  LV E/e' medial:  14.0 LV IVS:        1.73 cm  LV e' lateral:   8.49 cm/s LVOT diam:     1.90 cm  LV E/e' lateral: 9.7 LVOT Area:     2.84 cm  RIGHT VENTRICLE RV S prime:     19.40 cm/s TAPSE (M-mode): 1.9 cm LEFT ATRIUM             Index       RIGHT ATRIUM           Index LA diam:        3.50 cm 1.39 cm/m  RA Area:     24.60 cm LA Vol (A2C):   43.9 ml 17.46 ml/m RA Volume:  82.30 ml  32.73 ml/m LA Vol (A4C):   43.8 ml 17.42 ml/m LA Biplane Vol: 44.4 ml 17.66 ml/m   AORTA Ao Root diam: 3.10 cm MITRAL VALVE MV Area (PHT): 2.64 cm    SHUNTS MV Decel Time: 287 msec    Systemic Diam: 1.90 cm MV E velocity: 82.30  cm/s MV A velocity: 59.10 cm/s MV E/A ratio:  1.39 Rozann Lesches MD Electronically signed by Rozann Lesches MD Signature Date/Time: 10/21/2020/4:33:43 PM    Final     Critical Care Procedure Note Authorized and Performed by: Murvin Natal MD  Total Critical Care time: 36 mins  Due to a high probability of clinically significant, life threatening deterioration, the patient required my highest level of preparedness to intervene emergently and I personally spent this critical care time directly and personally managing the patient.  This critical care time included obtaining a history; examining the patient, pulse oximetry; ordering and review of studies; arranging urgent treatment with development of a management plan; evaluation of patient's response of treatment; frequent reassessment; and discussions with other providers.  This critical care time was performed to assess and manage the high probability of imminent and life threatening deterioration that could result in multi-organ failure.  It was exclusive of separately billable procedures and treating other patients and teaching time.   Irwin Brakeman, MD  How to contact the [facility] Attending or Consulting provider [place] or covering provider during after hours [place], for this patient?  1. Check the care team in [facility] and look for a) attending/consulting [facility] provider listed and b) the [facility] team listed 2. Log into [email or web address] and use 's universal password to access. If you do not have the password, please contact the hospital operator. 3. Locate the [facility] provider you are looking for under [facility] and page to a number that you can be directly reached. 4. If you still have difficulty reaching the provider, please page the [facility] (Director on Call) for the Hospitalists listed on amion for assistance.  10/29/2020, 3:25 PM   LOS: 9 days

## 2020-10-29 NOTE — Progress Notes (Signed)
ANTICOAGULATION CONSULT NOTE  Pharmacy Consult:  Heparin Indication: atrial fibrillation  No Known Allergies  Patient Measurements: Height: 5\' 8"  (172.7 cm) Weight: 126.5 kg (278 lb 14.1 oz) IBW/kg (Calculated) : 68.4 HEPARIN DW (KG): 103.8  Vital Signs: Temp: 100.2 F (37.9 C) (12/24 0400) Temp Source: Oral (12/24 0400) BP: 147/79 (12/24 0800) Pulse Rate: 74 (12/24 0800)  Labs: Recent Labs    10/27/20 0428 10/27/20 1058 10/27/20 2045 10/28/20 0444 10/28/20 1431 10/29/20 0428  HGB 13.2  --   --  12.9*  --  12.9*  HCT 47.4  --   --  44.9  --  44.3  PLT 195  --   --  170  --  179  APTT  --  33  --   --   --   --   HEPARINUNFRC  --  0.32   < > 0.29* 0.39 0.25*  CREATININE 3.50*  --   --  4.94*  --  4.83*   < > = values in this interval not displayed.    Estimated Creatinine Clearance: 22.7 mL/min (A) (by C-G formula based on SCr of 4.83 mg/dL (H)).  Assessment: Pharmacy consulted to dose heparin infusion for this 54 yo male with atrial fibrillation.  Patient was  taking apixaban PTA and was on treatment-dose Lovenox from 12-17 to 12-20. Heparin is being started due to patient's unstable renal function.   HL 0.25- subtherapeutic  Goal of Therapy:  Heparin level 0.3-0.7 units/ml Monitor platelets by anticoagulation protocol: Yes   Plan:  Increase heparin infusion to 1750 units/hr   HL in 6  Hours and daily while on heparin Monitor for signs and symptoms of bleeding

## 2020-10-29 NOTE — Progress Notes (Signed)
ANTICOAGULATION CONSULT NOTE  Pharmacy Consult:  Heparin Indication: atrial fibrillation  No Known Allergies  Patient Measurements: Height: 5\' 7"  (170.2 cm) Weight: 126.5 kg (278 lb 14.1 oz) IBW/kg (Calculated) : 66.1 HEPARIN DW (KG): 103.8  Vital Signs: Temp: 98.3 F (36.8 C) (12/24 2100) Temp Source: Oral (12/24 2100) BP: 137/53 (12/24 2200) Pulse Rate: 75 (12/24 2200)  Labs: Recent Labs    10/27/20 0428 10/27/20 1058 10/27/20 2045 10/28/20 0444 10/28/20 1431 10/29/20 0428 10/29/20 1514 10/29/20 2239  HGB 13.2  --   --  12.9*  --  12.9*  --   --   HCT 47.4  --   --  44.9  --  44.3  --   --   PLT 195  --   --  170  --  179  --   --   APTT  --  33  --   --   --   --   --   --   HEPARINUNFRC  --  0.32   < > 0.29*   < > 0.25* 0.27* 0.32  CREATININE 3.50*  --   --  4.94*  --  4.83*  --   --    < > = values in this interval not displayed.    Estimated Creatinine Clearance: 22.3 mL/min (A) (by C-G formula based on SCr of 4.83 mg/dL (H)).  Assessment: Pharmacy consulted to dose heparin infusion for this 54 yo male with atrial fibrillation.  Patient was  taking apixaban PTA and was on treatment-dose Lovenox from 12-17 to 12-20. Heparin is being started due to patient's unstable renal function.   Heparin level therapeutic (0.32) on gtt at 1900 units/hr. No bleeding noted.  Goal of Therapy:  Heparin level 0.3-0.7 units/ml Monitor platelets by anticoagulation protocol: Yes   Plan:  Continue heparin infusion to 1900 units/hr   Will f/u daily heparin level and CBC  Sherlon Handing, PharmD, BCPS Please see amion for complete clinical pharmacist phone list 10/29/2020 11:20 PM

## 2020-10-30 LAB — RENAL FUNCTION PANEL
Albumin: 2.6 g/dL — ABNORMAL LOW (ref 3.5–5.0)
Anion gap: 11 (ref 5–15)
BUN: 102 mg/dL — ABNORMAL HIGH (ref 6–20)
CO2: 33 mmol/L — ABNORMAL HIGH (ref 22–32)
Calcium: 9.4 mg/dL (ref 8.9–10.3)
Chloride: 110 mmol/L (ref 98–111)
Creatinine, Ser: 4.74 mg/dL — ABNORMAL HIGH (ref 0.61–1.24)
GFR, Estimated: 14 mL/min — ABNORMAL LOW (ref >60)
Glucose, Bld: 123 mg/dL — ABNORMAL HIGH (ref 70–99)
Phosphorus: 4 mg/dL (ref 2.5–4.6)
Potassium: 4.5 mmol/L (ref 3.5–5.1)
Sodium: 154 mmol/L — ABNORMAL HIGH (ref 135–145)

## 2020-10-30 LAB — GLUCOSE, CAPILLARY
Glucose-Capillary: 141 mg/dL — ABNORMAL HIGH (ref 70–99)
Glucose-Capillary: 133 mg/dL — ABNORMAL HIGH (ref 70–99)
Glucose-Capillary: 127 mg/dL — ABNORMAL HIGH (ref 70–99)
Glucose-Capillary: 151 mg/dL — ABNORMAL HIGH (ref 70–99)
Glucose-Capillary: 141 mg/dL — ABNORMAL HIGH (ref 70–99)

## 2020-10-30 LAB — PROTEIN, URINE, 24 HOUR
Collection Interval-UPROT: 24 hours
Protein, 24H Urine: 4532 mg/d — ABNORMAL HIGH (ref 50–100)
Protein, Urine: 159 mg/dL
Urine Total Volume-UPROT: 2850 mL

## 2020-10-30 LAB — CBC
HCT: 45 % (ref 39.0–52.0)
Hemoglobin: 12.7 g/dL — ABNORMAL LOW (ref 13.0–17.0)
MCH: 29.1 pg (ref 26.0–34.0)
MCHC: 28.2 g/dL — ABNORMAL LOW (ref 30.0–36.0)
MCV: 103.2 fL — ABNORMAL HIGH (ref 80.0–100.0)
Platelets: 189 10*3/uL (ref 150–400)
RBC: 4.36 MIL/uL (ref 4.22–5.81)
RDW: 14.2 % (ref 11.5–15.5)
WBC: 9.6 10*3/uL (ref 4.0–10.5)
nRBC: 0 % (ref 0.0–0.2)

## 2020-10-30 LAB — HEPARIN LEVEL (UNFRACTIONATED)
Heparin Unfractionated: 0.25 IU/mL — ABNORMAL LOW (ref 0.30–0.70)
Heparin Unfractionated: 0.34 IU/mL (ref 0.30–0.70)

## 2020-10-30 MED ORDER — HEPARIN BOLUS VIA INFUSION
1500.0000 [IU] | Freq: Once | INTRAVENOUS | Status: AC
  Administered 2020-10-30: 1500 [IU] via INTRAVENOUS
  Filled 2020-10-30: qty 1500

## 2020-10-30 MED ORDER — DEXTROSE 5 % IV SOLN: INTRAVENOUS | Status: DC

## 2020-10-30 MED ORDER — FREE WATER
400.0000 mL | Status: DC
  Administered 2020-10-30 – 2020-11-01 (×25): 400 mL

## 2020-10-30 NOTE — Progress Notes (Signed)
PROGRESS NOTE  Jack Cox GEX:[id number] DOB: [birth year] DOA: 10/20/2020 PCP: [name], MD   Brief History: 54 y.o.malewith medical history ofdiastolic CHF, atrial flutter, obstructive sleep apnea, hypertension, right bundle branch block presenting with shortness of breath for the last 2 to 3 days. Unfortunately, the patient is encephalopathic at the time of my evaluation; therefore, history is limited. Apparently, the patient went to see his nephrologist earlier on 10/20/2020. He was noted to have oxygen saturation in the 60s. As result, he was sent to the emergency department for further evaluation. Initially, the patient was awake and conversant with oxygen saturation in the low 70s. He was placed on a nonrebreather after which the patient became obtunded. VBG showed pH 7.1 66/97/106 on 80%. Subsequently, the patient was placed on the ventilator with PRVC,PEEP of 5, FiO2 70%. After approximately 10 to 15 minutes on the ventilator, the patient remained somnolent, but awakened and was able to tell me his name. He did state that he has not been taking his torsemide as directed. In addition, the patient has been sleeping in a recliner for an unclear duration of time secondary to shortness of breath. At baseline, the patient has a size 4 noncuffed tracheostomy. He is on supplemental oxygen 2.5 L during the day and 4 L at nighttime.  There is no reports of nausea, vomiting, diarrhea, chest pain, abdominal pain.  In the emergency department, the patient was afebrile and hemodynamically stable with oxygen saturation 93-94% on the ventilator with FiO2 70%. Chest x-ray showed left-sided pleural effusion with increased interstitial markings. BNP 790. PCT 0.12. BMP showed a serum creatinine 2.43. LFTs were unremarkable. WBC 8.1, hemoglobin 14.5, platelets 253,000. The patient was given ceftriaxone and azithromycin. He was also given furosemide 80 mg IV x1. COVID-19  RT-PCR negative.  He was continued on IV lasix 80 mg bid with uptrending of his serum creatinine.  Renal was consulted to assist.   Assessment/Plan: Acute on chronic respiratory failure with hypoxia and hypercarbia -Secondary to CHF in the setting of OSA -remainson ventilator -10/26/20 personally reviewed CXR--increased interstitial markings--?retrocardiac opacity -Vent settings PC16,PEEP [id number]%>>>50% -Pulmonary consultappreciated -PCT 0.12>>0.61 -having mucus plugging--needs aggressive suctioning -12/17 CXR--white out of left lung -continue mucomyst and albuterol -10/26/20-bronchoscopy--Thick inspissated mucus in proximal airways & BL lower lobes, removed with suctioning - hypertonic saline nebulizer treatments started by pulmonology 12/22  Acute on chronic diastolic CHF/Cor Pulmonale -06/25/2019 echo EF 55%, trivial TR -12/16/21echo--EF 60-65%, no WMA, RV pressure overload, mild TR -restarted IV lasix with albumin per renal with excellent response -pt remains clinically fluid overloaded -Daily BMP -Accurate I's and O's--NEG 13L -Daily weights I/O last 3 completed shifts: In: 1933.6 [I.V.:1733.6; IV Piggyback:200] Out: 0102 [Urine:3240] Total I/O In: 725 [I.V.:422] Out: -   Atrial flutter -apixaban on hold, he is on an IV heparin infusion -rate controlledbut had an episode 3-4 hours 12/18-12/19 evening -resume low dose metoprolol 12.5 mg BID per tube   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  -Monitor with diuresis -consult nephrology--appreciated-->albumin+lasix bid per renal  FEN -had mild hypoglycemia -NG placed 12/17 -switchedto Vital High Protein and increase rate to 40 cc/hr-->hypoglycemia improved  Hypernatremia - added more free water to regimen, recheck in AM.   Fever - improving with pneumonia management -101.0 fever evening 12/18 and  100.7 12/21 -blood cultures x 2--neg to date -UA and urine culture--no  significant pyuria -12/16 trach aspirate-->flora -10/26/20-->BAL sent for culture -start IV cefepime due to fevers, CXR findings  Essential hypertension -Anticipate improvement with diuresis -metoprolol low dose with holding parameters  OSA -The patient has a tracheostomy placed in 2001 -He has a size 4 cuffless trach  Right bundle branch block -This has been chronic with review of the medical records  Morbid obesity -BMI 52.94 -lifestyle modification  GOC -palliative following -currently full scope of care   Status is: Inpatient  Remains inpatient appropriate because:IV treatments appropriate due to intensity of illness or inability to take PO   Dispo: The patient is from:Home Anticipated d/c is GY:IRSW Anticipated d/c date is: >3 days Patient currently is not medically stable to d/c.   Family Communication:brotherupdated bedside 12/20-21  Consultants:pulm, renal  Code Status: FULL  DVT Prophylaxis: IV heparin infusion for full anticoagulation   Procedures: As Listed in Progress Note Above  Antibiotics: Cefepime 12/21>>  Subjective: Patient sedated  Objective: Vitals:   10/30/20 0500 10/30/20 0600 10/30/20 0700 10/30/20 0756  BP: (!) 114/40 120/62 (!) 125/49   Pulse: 77 79 74   Resp: (!) 26 (!) 26 (!) 27   Temp:      TempSrc:      SpO2: 95% 96% 95% 95%  Weight:      Height:        Intake/Output Summary (Last 24 hours) at 10/30/2020 0846 Last data filed at 10/30/2020 0737 Gross per 24 hour  Intake 1188.95 ml  Output 2240 ml  Net -1051.05 ml   Weight change: 0.6 kg Exam:  General - pt is on vent.  Neck - supple, no JVD.  Lungs: good air movement bilateral. No increased WOB.  CV - normal s1,s2 sounds. No MRG.  Abd - obese, soft, no masses palpated Ext - trace pretibial edema BLEs.  Neuro - nonfocal  exam, pt is sedated.   Data Reviewed: I have personally reviewed following labs and imaging studies Basic Metabolic Panel: Recent Labs  Lab 10/26/20 0436 10/27/20 0428 10/28/20 0444 10/29/20 0428 10/30/20 0444  NA 147* 149* 151* 153* 154*  K 4.1 5.1 4.9 4.5 4.5  CL 99 103 104 107 110  CO2 35* 36* 33* 34* 33*  GLUCOSE 124* 129* 139* 138* 123*  BUN 60* 74* 95* 108* 102*  CREATININE 2.40* 3.50* 4.94* 4.83* 4.74*  CALCIUM 9.3 8.9 8.9 9.1 9.4  MG 1.9  --   --   --   --   PHOS 3.8 7.1* 5.5* 4.0 4.0   Liver Function Tests: Recent Labs  Lab 10/24/20 0402 10/25/20 0618 10/26/20 0436 10/27/20 0428 10/28/20 0444 10/29/20 0428 10/30/20 0444  AST 29 27  --   --   --   --   --   ALT 16 16  --   --   --   --   --   ALKPHOS 71 75  --   --   --   --   --   BILITOT 0.6 0.8  --   --   --   --   --   PROT 6.1* 6.9  --   --   --   --   --   ALBUMIN 2.6* 3.0* 3.1* 2.7* 2.5* 2.5* 2.6*   No results for input(s): LIPASE, AMYLASE in the last 168 hours. No results for input(s): AMMONIA in the last 168 hours. Coagulation Profile: No results for input(s): INR, PROTIME in the last 168 hours. CBC: Recent Labs  Lab 10/26/20  1007 10/27/20 0428 10/28/20 0444 10/29/20 0428 10/30/20 0444  WBC 6.4 9.0 8.6 8.3 9.6  HGB 14.4 13.2 12.9* 12.9* 12.7*  HCT 49.3 47.4 44.9 44.3 45.0  MCV 99.4 105.3* 102.0* 101.4* 103.2*  PLT 194 195 170 179 189   Cardiac Enzymes: No results for input(s): CKTOTAL, CKMB, CKMBINDEX, TROPONINI in the last 168 hours. BNP: Invalid input(s): POCBNP CBG: Recent Labs  Lab 10/29/20 1208 10/29/20 1633 10/29/20 2006 10/29/20 2255 10/30/20 0734  GLUCAP 138* 128* 121* 134* 141*   HbA1C: No results for input(s): HGBA1C in the last 72 hours. Urine analysis:    Component Value Date/Time   COLORURINE YELLOW 10/28/2020 0429   APPEARANCEUR HAZY (A) 10/28/2020 0429   LABSPEC 1.014 10/28/2020 0429   PHURINE 5.0 10/28/2020 0429   GLUCOSEU NEGATIVE 10/28/2020 0429    HGBUR SMALL (A) 10/28/2020 0429   BILIRUBINUR NEGATIVE 10/28/2020 0429   KETONESUR NEGATIVE 10/28/2020 0429   PROTEINUR 100 (A) 10/28/2020 0429   NITRITE NEGATIVE 10/28/2020 0429   LEUKOCYTESUR NEGATIVE 10/28/2020 0429    Recent Results (from the past 240 hour(s))  Resp Panel by RT-PCR (Flu A&B, Covid) Nasopharyngeal Swab     Status: None   Collection Time: 10/20/20 11:43 AM   Specimen: Nasopharyngeal Swab; Nasopharyngeal(NP) swabs in vial transport medium  Result Value Ref Range Status   SARS Coronavirus 2 by RT PCR NEGATIVE NEGATIVE Final    Comment: (NOTE) SARS-CoV-2 target nucleic acids are NOT DETECTED.  The SARS-CoV-2 RNA is generally detectable in upper respiratory specimens during the acute phase of infection. The lowest concentration of SARS-CoV-2 viral copies this assay can detect is 138 copies/mL. A negative result does not preclude SARS-Cov-2 infection and should not be used as the sole basis for treatment or other patient management decisions. A negative result may occur with  improper specimen collection/handling, submission of specimen other than nasopharyngeal swab, presence of viral mutation(s) within the areas targeted by this assay, and inadequate number of viral copies(<138 copies/mL). A negative result must be combined with clinical observations, patient history, and epidemiological information. The expected result is Negative.  Fact Sheet for Patients:  EntrepreneurPulse.com.au  Fact Sheet for Healthcare Providers:  IncredibleEmployment.be  This test is no t yet approved or cleared by the [place] FDA and  has been authorized for detection and/or diagnosis of SARS-CoV-2 by FDA under an Emergency Use Authorization (EUA). This EUA will remain  in effect (meaning this test can be used) for the duration of the COVID-19 declaration under Section 564(b)(1) of the Act, 21 U.S.C.section 360bbb-3(b)(1), unless the  authorization is terminated  or revoked sooner.       Influenza A by PCR NEGATIVE NEGATIVE Final   Influenza B by PCR NEGATIVE NEGATIVE Final    Comment: (NOTE) The Xpert Xpress SARS-CoV-2/FLU/RSV plus assay is intended as an aid in the diagnosis of influenza from Nasopharyngeal swab specimens and should not be used as a sole basis for treatment. Nasal washings and aspirates are unacceptable for Xpert Xpress SARS-CoV-2/FLU/RSV testing.  Fact Sheet for Patients: EntrepreneurPulse.com.au  Fact Sheet for Healthcare Providers: IncredibleEmployment.be  This test is not yet approved or cleared by the [place] FDA and has been authorized for detection and/or diagnosis of SARS-CoV-2 by FDA under an Emergency Use Authorization (EUA). This EUA will remain in effect (meaning this test can be used) for the duration of the COVID-19 declaration under Section 564(b)(1) of the Act, 21 U.S.C. section 360bbb-3(b)(1), unless the authorization is terminated or revoked.  Performed  at [facility], [address] [place], [place] [address]   Blood culture (routine x 2)     Status: None   Collection Time: 10/20/20 12:18 PM   Specimen: BLOOD  Result Value Ref Range Status   Specimen Description BLOOD  Final   Special Requests NONE  Final   Culture   Final    NO GROWTH 5 DAYS Performed at [facility], [address], [place], [place] [address]    Report Status 10/25/2020 FINAL  Final  Blood culture (routine x 2)     Status: None   Collection Time: 10/20/20 12:59 PM   Specimen: BLOOD RIGHT ARM  Result Value Ref Range Status   Specimen Description BLOOD RIGHT ARM  Final   Special Requests   Final    BOTTLES DRAWN AEROBIC AND ANAEROBIC Blood Culture adequate volume   Culture   Final    NO GROWTH 5 DAYS Performed at [facility], [address], [place], [place] [address]    Report Status 10/25/2020 FINAL  Final  Urine culture     Status: None    Collection Time: 10/20/20  3:42 PM   Specimen: Urine, Catheterized  Result Value Ref Range Status   Specimen Description   Final    URINE, CATHETERIZED Performed at [facility], [address] [place], [place] [address]    Special Requests   Final    NONE Performed at [facility], [address], [place], [place] [address]    Culture   Final    NO GROWTH Performed at [facility], [place] [address], [place], [place] [address]    Report Status 10/21/2020 FINAL  Final  MRSA PCR Screening     Status: None   Collection Time: 10/20/20  6:42 PM   Specimen: Nasal Mucosa; Nasopharyngeal  Result Value Ref Range Status   MRSA by PCR NEGATIVE NEGATIVE Final    Comment:        The GeneXpert MRSA Assay (FDA approved for NASAL specimens only), is one component of a comprehensive MRSA colonization surveillance program. It is not intended to diagnose MRSA infection nor to guide or monitor treatment for MRSA infections. Performed at [facility], [address] [place], [place] [address]   Culture, respiratory (non-expectorated)     Status: None   Collection Time: 10/21/20  8:20 AM   Specimen: Tracheal Aspirate; Respiratory  Result Value Ref Range Status   Specimen Description   Final    TRACHEAL ASPIRATE Performed at [facility], [address], [place], [place] [address]    Special Requests   Final    NONE Performed at [facility], [address], [place], [place] [address]    Gram Stain   Final    FEW WBC PRESENT, PREDOMINANTLY MONONUCLEAR NO ORGANISMS SEEN    Culture   Final    RARE Normal respiratory flora-no Staph aureus or Pseudomonas seen Performed at [facility] [address] [place], [place] [address]    Report Status 10/23/2020 FINAL  Final  Culture, Urine     Status: None   Collection Time: 10/24/20 12:39 PM   Specimen: Urine, Catheterized  Result Value Ref Range Status   Specimen Description   Final    URINE, CATHETERIZED Performed at [facility], [address], [place], [place] [address]    Special Requests   Final    NONE Performed at [facility], [address] [place], [place] [address]    Culture  Final    NO GROWTH Performed at [facility], [place] [address] [place], [place] [address]    Report Status 10/25/2020 FINAL  Final  Culture, blood (Routine X 2) w Reflex to ID Panel     Status: None   Collection Time: 10/24/20  1:34 PM   Specimen: BLOOD RIGHT HAND  Result Value Ref Range Status   Specimen Description   Final    BLOOD RIGHT HAND BOTTLES DRAWN AEROBIC AND ANAEROBIC   Special Requests Blood Culture adequate volume  Final   Culture   Final    NO GROWTH 5 DAYS Performed at [facility], [address], [place], [place] [address]    Report Status 10/29/2020 FINAL  Final  Culture, blood (Routine X 2) w Reflex to ID Panel     Status: None   Collection Time: 10/24/20  1:44 PM   Specimen: BLOOD LEFT WRIST  Result Value Ref Range Status   Specimen Description   Final    BLOOD LEFT WRIST BOTTLES DRAWN AEROBIC AND ANAEROBIC   Special Requests Blood Culture adequate volume  Final   Culture   Final    NO GROWTH 5 DAYS Performed at [facility], [address], [place], [place] [address]    Report Status 10/29/2020 FINAL  Final  Culture, respiratory (non-expectorated)     Status: None   Collection Time: 10/26/20  6:07 PM   Specimen: Tracheal Aspirate; Respiratory  Result Value Ref Range Status   Specimen Description   Final    TRACHEAL ASPIRATE Performed at [facility], [address], [place], [place] [address]    Special Requests   Final    Normal Performed at [facility], [address] [place], [place] [address]    Gram Stain   Final    FEW WBC PRESENT, PREDOMINANTLY PMN MODERATE GRAM POSITIVE RODS FEW GRAM POSITIVE COCCI FEW GRAM NEGATIVE RODS    Culture   Final    FEW Normal respiratory flora-no Staph aureus or Pseudomonas seen Performed at [facility], [place] [address],  [place], [place] [address]    Report Status 10/29/2020 FINAL  Final     Scheduled Meds: . acetylcysteine  1 mL Nebulization Q6H  . albuterol  2.5 mg Nebulization Q6H  . chlorhexidine gluconate (MEDLINE KIT)  15 mL Mouth Rinse BID  . Chlorhexidine Gluconate Cloth  6 each Topical Daily  . free water  400 mL Per Tube Q2H  . mouth rinse  15 mL Mouth Rinse 2 times per day  . metoprolol tartrate  12.5 mg Per Tube BID  . pantoprazole sodium  40 mg Per Tube Q1200  . sodium chloride flush  3 mL Intravenous Q12H  . sodium chloride HYPERTONIC  4 mL Nebulization BID   Continuous Infusions: . sodium chloride    . sodium chloride    . amiodarone    . ceFEPime (MAXIPIME) IV Stopped (10/29/20 2155)  . dexmedetomidine (PRECEDEX) IV infusion 0.8 mcg/kg/hr (10/30/20 0737)  . feeding supplement (VITAL HIGH PROTEIN) 1,000 mL (10/27/20 1604)  . heparin 2,100 Units/hr (10/30/20 0839)   Procedures/Studies: US RENAL  Result Date: 10/28/2020 CLINICAL DATA:  Chronic kidney disease.  Acute kidney injury. EXAM: RENAL / URINARY TRACT ULTRASOUND COMPLETE COMPARISON:  CT 06/12/2018 FINDINGS: Right Kidney: Renal measurements: 11.0 x 6.4 x 5.2 cm = volume: 190 mL. Increased echogenicity. No focal lesion or hydronephrosis. Left Kidney: Renal measurements: 11.2 x 7.6 x 6.2 cm = volume: 276 mL. Increased echogenicity.  No focal lesion or hydronephrosis. Bladder: Foley catheter within the bladder. Other: None. IMPRESSION: Slightly enlarged kidneys showing increased echogenicity. Findings could be due to chronic diabetic glomerulonephropathy or acute nephritis. No obstruction or focal lesion. Electronically Signed   By: Nelson Chimes M.D.   On: 10/28/2020 14:30   DG Chest Port 1 View  Result Date: 10/27/2020 CLINICAL DATA:  Acute respiratory failure EXAM: PORTABLE CHEST 1 VIEW COMPARISON:  Yesterday FINDINGS: Enteric and tracheostomy tubes remain in good position. Cardiomegaly. Interstitial opacity which could be bronchitic or  congestive. There is improved aeration at the right base. Negative for pneumothorax. IMPRESSION: 1. Improved aeration. 2. Bronchitic or congestive interstitial opacity. Electronically Signed   By: Monte Fantasia M.D.   On: 10/27/2020 07:46   DG Chest Port 1 View  Result Date: 10/26/2020 CLINICAL DATA:  Shortness of breath for 2-3 days EXAM: PORTABLE CHEST 1 VIEW COMPARISON:  Three days ago FINDINGS: Progressive hazy appearance of the right mid to lower chest. Dense retrocardiac opacity persists. An enteric tube and tracheostomy tube remain in place. Cardiomegaly and vascular pedicle widening. IMPRESSION: Pleural fluid/pulmonary opacification at the bases which has progressed from 3 days ago. Electronically Signed   By: Monte Fantasia M.D.   On: 10/26/2020 06:02   DG CHEST PORT 1 VIEW  Result Date: 10/23/2020 CLINICAL DATA:  Shortness of breath. EXAM: PORTABLE CHEST 1 VIEW COMPARISON:  October 22, 2020 FINDINGS: The near complete opacification of the left hemithorax seen on the comparison chest x-ray has resolved. Persistent bibasilar opacities are likely atelectasis. No pneumothorax. A tracheostomy tube is in good position. An NG tube terminates in the stomach. No other acute abnormalities. IMPRESSION: 1. Support apparatus as above. 2. Bibasilar atelectasis and a possible small left effusion. Improved aeration of the left lung. Electronically Signed   By: Dorise Bullion III M.D   On: 10/23/2020 14:57   DG Chest Port 1 View  Result Date: 10/22/2020 CLINICAL DATA:  Acute respiratory failure. EXAM: PORTABLE CHEST 1 VIEW COMPARISON:  10/20/2020 FINDINGS: Tracheostomy tube overlies the airway. The cardiac silhouette is enlarged. There is persistent mild central pulmonary vascular congestion without overt edema. Airspace opacities in the left greater than right mid and lower lungs have slightly improved. Small pleural effusions are questioned. No pneumothorax is identified. IMPRESSION: Slight  improvement of bilateral airspace opacities which could reflect pneumonia or atelectasis. Electronically Signed   By: [name] M.D.   On: 10/22/2020 09:16   DG Chest Port 1 View  Result Date: 10/20/2020 CLINICAL DATA:  Questionable sepsis EXAM: PORTABLE CHEST 1 VIEW COMPARISON:  Chest x-ray 06/12/2018 report without images. FINDINGS: Tracheostomy tube terminates approximately 7.5 cm above the carina. The heart size and mediastinal contours are not well visualized. Bilateral hilar vasculature prominence. Aortic arch calcifications. Silhouetting off of the left hemidiaphragm and left cardiac border. Partial silhouetting off the right cardiac border and right hemidiaphragm. Increased interstitial markings. Likely at least trace to small volume left pleural effusion. No pneumothorax. No acute osseous abnormality. IMPRESSION: 1. Left lung opacity and likely at least trace to small volume left pleural effusion with silhouetting off of the left diaphragm and heart border. Concern for infection/inflammation. 2. Right lower lobe and middle lobe airspace opacity could represent a combination of atelectasis, infection, inflammation. 3. At least mild pulmonary edema. 4. Consider chest x-ray PA and lateral view for further evaluation. Electronically Signed   By: Iven Finn M.D.   On: 10/20/2020 12:26   DG Chest Port 1V same  Day  Result Date: 10/22/2020 CLINICAL DATA:  Status post NG tube placement. EXAM: PORTABLE CHEST 1 VIEW COMPARISON:  Single-view of the chest earlier today. FINDINGS: The upper chest is off the margin of the film. NG tube is looped in the upper trachea or esophagus. Visualized left chest is completely whited out on this study. IMPRESSION: NG tube is looped in the upper esophagus or trachea. Complete whiteout of the left chest is new since the study earlier today could be due to mucous plugging and left lung collapse or rapidly developing pleural effusion and airspace disease. Electronically  Signed   By: [name] M.D.   On: 10/22/2020 15:16   DG Abd Portable 1V  Result Date: 10/22/2020 CLINICAL DATA:  Nasogastric tube placement EXAM: PORTABLE ABDOMEN - 1 VIEW COMPARISON:  Portable exam 1613 hours compared to 1523 hours FINDINGS: Tip of nasogastric tube projects over distal gastric antrum, consider withdrawal 5 cm. Bowel gas pattern normal. Opacification of the inferior LEFT hemithorax again seen. IMPRESSION: Tip of nasogastric tube projects over distal gastric antrum. Consider withdrawal of nasogastric tube 5 cm. Electronically Signed   By: [name] M.D.   On: 10/22/2020 16:36   DG Abd Portable 1V  Result Date: 10/22/2020 CLINICAL DATA:  Nasogastric tube placement EXAM: PORTABLE ABDOMEN - 1 VIEW COMPARISON:  None. FINDINGS: Nasogastric tube extends into the gastric cardia where it loops upon itself with the tip at the junction of the mid and distal thirds of the esophagus. There is no bowel dilatation or air-fluid level to suggest bowel obstruction. No free air. There is consolidation left lower lobe. IMPRESSION: Nasogastric tube extends into the stomach where it loops upon itself with the tip at the junction of the mid and distal thirds of the esophagus. No bowel obstruction or free air evident. Consolidation left lower lobe. These results will be called to the ordering clinician or representative by the Radiologist Assistant, and communication documented in the PACS or [employer]. Electronically Signed   By: Lowella Grip III M.D.   On: 10/22/2020 15:34   ECHOCARDIOGRAM COMPLETE  Result Date: 10/21/2020    ECHOCARDIOGRAM REPORT   Patient Name:   JJESUS DINGLEY Date of Exam: 10/21/2020 Medical Rec #:  [id number]        Height:       68.0 in Accession #:    [id number]       Weight:       325.8 lb Date of Birth:  18-Aug-1966        BSA:          2.515 m Patient Age:    20 years         BP:           112/61 mmHg Patient Gender: M                HR:           51 bpm.  Exam Location:  [facility] Procedure: 2D Echo Indications:    CHF-Acute Diastolic U44.03  History:        Patient has no prior history of Echocardiogram examinations.                 CHF; Risk Factors:Non-Smoker. Acute on chronic respiratory                 failure with hypoxia.  Sonographer:    Leavy Cella RDCS (AE) Referring Phys: [phone] DAVID TAT IMPRESSIONS  1. Left ventricular ejection fraction,  by estimation, is 60 to 65%. The left ventricle has normal function. The left ventricle has no regional wall motion abnormalities. There is severe left ventricular hypertrophy. Left ventricular diastolic parameters  are indeterminate. There is the interventricular septum is flattened in systole and diastole, consistent with right ventricular pressure and volume overload.  2. Right ventricular systolic function is mildly reduced. The right ventricular size is mildly enlarged. Tricuspid regurgitation signal is inadequate for assessing PA pressure.  3. Right atrial size was upper normal.  4. The pericardial effusion is posterior to the left ventricle.  5. The mitral valve is grossly normal. Trivial mitral valve regurgitation.  6. The aortic valve is tricuspid. Aortic valve regurgitation is not visualized.  7. The inferior vena cava is normal in size with greater than 50% respiratory variability, suggesting right atrial pressure of 3 mmHg. FINDINGS  Left Ventricle: Left ventricular ejection fraction, by estimation, is 60 to 65%. The left ventricle has normal function. The left ventricle has no regional wall motion abnormalities. The left ventricular internal cavity size was normal in size. There is  severe left ventricular hypertrophy. The interventricular septum is flattened in systole and diastole, consistent with right ventricular pressure and volume overload. Left ventricular diastolic parameters are indeterminate. Right Ventricle: The right ventricular size is mildly enlarged. No increase in right ventricular wall  thickness. Right ventricular systolic function is mildly reduced. Tricuspid regurgitation signal is inadequate for assessing PA pressure. Left Atrium: Left atrial size was normal in size. Right Atrium: Right atrial size was upper normal. Pericardium: Trivial pericardial effusion is present. The pericardial effusion is posterior to the left ventricle. Mitral Valve: The mitral valve is grossly normal. Trivial mitral valve regurgitation. Tricuspid Valve: The tricuspid valve is grossly normal. Tricuspid valve regurgitation is mild. Aortic Valve: The aortic valve is tricuspid. There is moderate aortic valve annular calcification. Aortic valve regurgitation is not visualized. Pulmonic Valve: The pulmonic valve was grossly normal. Pulmonic valve regurgitation is trivial. Aorta: The aortic root is normal in size and structure. Venous: The inferior vena cava is normal in size with greater than 50% respiratory variability, suggesting right atrial pressure of 3 mmHg. IAS/Shunts: No atrial level shunt detected by color flow Doppler.  LEFT VENTRICLE PLAX 2D LVIDd:         4.66 cm  Diastology LVIDs:         2.03 cm  LV e' medial:    5.87 cm/s LV PW:         1.76 cm  LV E/e' medial:  14.0 LV IVS:        1.73 cm  LV e' lateral:   8.49 cm/s LVOT diam:     1.90 cm  LV E/e' lateral: 9.7 LVOT Area:     2.84 cm  RIGHT VENTRICLE RV S prime:     19.40 cm/s TAPSE (M-mode): 1.9 cm LEFT ATRIUM             Index       RIGHT ATRIUM           Index LA diam:        3.50 cm 1.39 cm/m  RA Area:     24.60 cm LA Vol (A2C):   43.9 ml 17.46 ml/m RA Volume:   82.30 ml  32.73 ml/m LA Vol (A4C):   43.8 ml 17.42 ml/m LA Biplane Vol: 44.4 ml 17.66 ml/m   AORTA Ao Root diam: 3.10 cm MITRAL VALVE MV Area (PHT): 2.64 cm    SHUNTS  MV Decel Time: 287 msec    Systemic Diam: 1.90 cm MV E velocity: 82.30 cm/s MV A velocity: 59.10 cm/s MV E/A ratio:  1.39 Rozann Lesches MD Electronically signed by Rozann Lesches MD Signature Date/Time: 10/21/2020/4:33:43  PM    Final     Critical Care Procedure Note Authorized and Performed by: Murvin Natal MD  Total Critical Care time: 33 mins  Due to a high probability of clinically significant, life threatening deterioration, the patient required my highest level of preparedness to intervene emergently and I personally spent this critical care time directly and personally managing the patient.  This critical care time included obtaining a history; examining the patient, pulse oximetry; ordering and review of studies; arranging urgent treatment with development of a management plan; evaluation of patient's response of treatment; frequent reassessment; and discussions with other providers.  This critical care time was performed to assess and manage the high probability of imminent and life threatening deterioration that could result in multi-organ failure.  It was exclusive of separately billable procedures and treating other patients and teaching time.   Irwin Brakeman, MD  How to contact the [facility] Attending or Consulting provider [place] or covering provider during after hours [place], for this patient?  1. Check the care team in [facility] and look for a) attending/consulting TRH provider listed and b) the [facility] team listed 2. Log into [email or web address] and use [place]'s universal password to access. If you do not have the password, please contact the hospital operator. 3. Locate the [facility] provider you are looking for under [facility] and page to a number that you can be directly reached. 4. If you still have difficulty reaching the provider, please page the [facility] (Director on Call) for the Hospitalists listed on amion for assistance.  10/30/2020, 8:46 AM   LOS: 10 days

## 2020-10-30 NOTE — Progress Notes (Signed)
ANTICOAGULATION CONSULT NOTE  Pharmacy Consult:  Heparin Indication: atrial fibrillation  No Known Allergies  Patient Measurements: Height: 5\' 7"  (170.2 cm) Weight: 127.1 kg (280 lb 3.3 oz) IBW/kg (Calculated) : 66.1 HEPARIN DW (KG): 103.8  Vital Signs: Temp: 98.9 F (37.2 C) (12/25 1654) Temp Source: Axillary (12/25 1654) BP: 128/55 (12/25 1400) Pulse Rate: 70 (12/25 1400)  Labs: Recent Labs    10/28/20 0444 10/28/20 1431 10/29/20 0428 10/29/20 1514 10/29/20 2239 10/30/20 0444 10/30/20 1537  HGB 12.9*  --  12.9*  --   --  12.7*  --   HCT 44.9  --  44.3  --   --  45.0  --   PLT 170  --  179  --   --  189  --   HEPARINUNFRC 0.29*   < > 0.25*   < > 0.32 0.25* 0.34  CREATININE 4.94*  --  4.83*  --   --  4.74*  --    < > = values in this interval not displayed.    Estimated Creatinine Clearance: 22.8 mL/min (A) (by C-G formula based on SCr of 4.74 mg/dL (H)).  Assessment: Pharmacy consulted to dose heparin infusion for this 54 yo male with atrial fibrillation.  Patient was  taking apixaban PTA and was on treatment-dose Lovenox from 12-17 to 12-20. Heparin is being started due to patient's unstable renal function.   Heparin level is therapeutic on 2100 units/hr at 0.34. H&H stable, PLTs WNL.   Goal of Therapy:  Heparin level 0.3-0.7 units/ml Monitor platelets by anticoagulation protocol: Yes   Plan:  Continue heparin 2100 units/hr Daily HL, CBC Monitor for signs and symptoms of bleeding  Romilda Garret, PharmD PGY1 Acute Care Pharmacy Resident 10/30/2020 6:57 PM  Please check [email or web address] for unit specific pharmacy phone numbers.

## 2020-10-30 NOTE — Progress Notes (Signed)
ANTICOAGULATION CONSULT NOTE  Pharmacy Consult:  Heparin Indication: atrial fibrillation  No Known Allergies  Patient Measurements: Height: 5\' 7"  (170.2 cm) Weight: 127.1 kg (280 lb 3.3 oz) IBW/kg (Calculated) : 66.1 HEPARIN DW (KG): 103.8  Vital Signs: Temp: 98.3 F (36.8 C) (12/24 2100) Temp Source: Oral (12/24 2100) BP: 125/49 (12/25 0700) Pulse Rate: 74 (12/25 0700)  Labs: Recent Labs    10/27/20 1058 10/27/20 2045 10/28/20 0444 10/28/20 1431 10/29/20 0428 10/29/20 1514 10/29/20 2239 10/30/20 0444  HGB  --    < > 12.9*  --  12.9*  --   --  12.7*  HCT  --   --  44.9  --  44.3  --   --  45.0  PLT  --   --  170  --  179  --   --  189  APTT 33  --   --   --   --   --   --   --   HEPARINUNFRC 0.32   < > 0.29*   < > 0.25* 0.27* 0.32 0.25*  CREATININE  --   --  4.94*  --  4.83*  --   --  4.74*   < > = values in this interval not displayed.    Estimated Creatinine Clearance: 22.8 mL/min (A) (by C-G formula based on SCr of 4.74 mg/dL (H)).  Assessment: Pharmacy consulted to dose heparin infusion for this 54 yo male with atrial fibrillation.  Patient was  taking apixaban PTA and was on treatment-dose Lovenox from 12-17 to 12-20. Heparin is being started due to patient's unstable renal function.   HL 0.25- slightly subtherapeutic  Goal of Therapy:  Heparin level 0.3-0.7 units/ml Monitor platelets by anticoagulation protocol: Yes   Plan:  Rebolus heparin 1500 units x 1 Increase heparin infusion to 2100 units/hr   HL in 6  Hours and daily while on heparin Monitor for signs and symptoms of bleeding

## 2020-10-30 NOTE — Progress Notes (Signed)
[facility] ROUNDING NOTE   Subjective:   Brief history: This is a 54 year old gentleman with a history of diastolic heart failure atrial flutter hypertension right bundle branch block encephalopathy.  Developed hypoxic respiratory failure requiring supplemental oxygen.  Developed acute on chronic kidney disease stage IV baseline.  Renal ultrasound did not reveal any evidence of obstruction 10/28/2020.  Baseline creatinine 2.5 mg/dL urine output 2.2 L 10/29/2020 weight 127 kg  Sodium 134 potassium 4.5 chloride 110 CO2 33 BUN 102 creatinine 4.74 glucose 123 calcium 9.4 hemoglobin 12.7  Blood pressure 121/49 pulse 73 temperature 99 O2 sats 9 9% FiO2 50%  Objective:  Vital signs in last 24 hours:  Temp:  [98.3 F (36.8 C)-99.9 F (37.7 C)] 99 F (37.2 C) (12/25 0756) Pulse Rate:  [73-86] 74 (12/25 0700) Resp:  [21-28] 27 (12/25 0700) BP: (114-141)/(38-127) 125/49 (12/25 0700) SpO2:  [93 %-96 %] 95 % (12/25 0756) FiO2 (%):  [50 %] 50 % (12/25 0756) Weight:  [127.1 kg] 127.1 kg (12/25 0320)  Weight change: 0.6 kg Filed Weights   10/28/20 0500 10/29/20 0500 10/30/20 0320  Weight: 128.9 kg 126.5 kg 127.1 kg    Intake/Output: I/O last 3 completed shifts: In: 1933.6 [I.V.:1733.6; IV Piggyback:200] Out: 4332 [Urine:3240]   Intake/Output this shift:  Total I/O In: 422 [I.V.:422] Out: -    Ventilator via trach somnolent CVS-irregular rate and rhythm no murmurs rubs gallops RS-ventilated breath sounds bilaterally ABD-obese nontender EXT-diffuse 1+ edema   Basic Metabolic Panel: Recent Labs  Lab 10/26/20 0436 10/27/20 0428 10/28/20 0444 10/29/20 0428 10/30/20 0444  NA 147* 149* 151* 153* 154*  K 4.1 5.1 4.9 4.5 4.5  CL 99 103 104 107 110  CO2 35* 36* 33* 34* 33*  GLUCOSE 124* 129* 139* 138* 123*  BUN 60* 74* 95* 108* 102*  CREATININE 2.40* 3.50* 4.94* 4.83* 4.74*  CALCIUM 9.3 8.9 8.9 9.1 9.4  MG 1.9  --   --   --   --   PHOS 3.8 7.1* 5.5* 4.0 4.0     Liver Function Tests: Recent Labs  Lab 10/24/20 0402 10/25/20 0618 10/26/20 0436 10/27/20 0428 10/28/20 0444 10/29/20 0428 10/30/20 0444  AST 29 27  --   --   --   --   --   ALT 16 16  --   --   --   --   --   ALKPHOS 71 75  --   --   --   --   --   BILITOT 0.6 0.8  --   --   --   --   --   PROT 6.1* 6.9  --   --   --   --   --   ALBUMIN 2.6* 3.0* 3.1* 2.7* 2.5* 2.5* 2.6*   No results for input(s): LIPASE, AMYLASE in the last 168 hours. No results for input(s): AMMONIA in the last 168 hours.  CBC: Recent Labs  Lab 10/26/20 0436 10/27/20 0428 10/28/20 0444 10/29/20 0428 10/30/20 0444  WBC 6.4 9.0 8.6 8.3 9.6  HGB 14.4 13.2 12.9* 12.9* 12.7*  HCT 49.3 47.4 44.9 44.3 45.0  MCV 99.4 105.3* 102.0* 101.4* 103.2*  PLT 194 195 170 179 189    Cardiac Enzymes: No results for input(s): CKTOTAL, CKMB, CKMBINDEX, TROPONINI in the last 168 hours.  BNP: Invalid input(s): POCBNP  CBG: Recent Labs  Lab 10/29/20 1208 10/29/20 1633 10/29/20 2006 10/29/20 2255 10/30/20 0734  GLUCAP 138* 128* 121* 134* 141*  Microbiology: Results for orders placed or performed during the hospital encounter of 10/20/20  Resp Panel by RT-PCR (Flu A&B, Covid) Nasopharyngeal Swab     Status: None   Collection Time: 10/20/20 11:43 AM   Specimen: Nasopharyngeal Swab; Nasopharyngeal(NP) swabs in vial transport medium  Result Value Ref Range Status   SARS Coronavirus 2 by RT PCR NEGATIVE NEGATIVE Final    Comment: (NOTE) SARS-CoV-2 target nucleic acids are NOT DETECTED.  The SARS-CoV-2 RNA is generally detectable in upper respiratory specimens during the acute phase of infection. The lowest concentration of SARS-CoV-2 viral copies this assay can detect is 138 copies/mL. A negative result does not preclude SARS-Cov-2 infection and should not be used as the sole basis for treatment or other patient management decisions. A negative result may occur with  improper specimen  collection/handling, submission of specimen other than nasopharyngeal swab, presence of viral mutation(s) within the areas targeted by this assay, and inadequate number of viral copies(<138 copies/mL). A negative result must be combined with clinical observations, patient history, and epidemiological information. The expected result is Negative.  Fact Sheet for Patients:  EntrepreneurPulse.com.au  Fact Sheet for Healthcare Providers:  IncredibleEmployment.be  This test is no t yet approved or cleared by the [place] FDA and  has been authorized for detection and/or diagnosis of SARS-CoV-2 by FDA under an Emergency Use Authorization (EUA). This EUA will remain  in effect (meaning this test can be used) for the duration of the COVID-19 declaration under Section 564(b)(1) of the Act, 21 U.S.C.section 360bbb-3(b)(1), unless the authorization is terminated  or revoked sooner.       Influenza A by PCR NEGATIVE NEGATIVE Final   Influenza B by PCR NEGATIVE NEGATIVE Final    Comment: (NOTE) The Xpert Xpress SARS-CoV-2/FLU/RSV plus assay is intended as an aid in the diagnosis of influenza from Nasopharyngeal swab specimens and should not be used as a sole basis for treatment. Nasal washings and aspirates are unacceptable for Xpert Xpress SARS-CoV-2/FLU/RSV testing.  Fact Sheet for Patients: EntrepreneurPulse.com.au  Fact Sheet for Healthcare Providers: IncredibleEmployment.be  This test is not yet approved or cleared by the [place] FDA and has been authorized for detection and/or diagnosis of SARS-CoV-2 by FDA under an Emergency Use Authorization (EUA). This EUA will remain in effect (meaning this test can be used) for the duration of the COVID-19 declaration under Section 564(b)(1) of the Act, 21 U.S.C. section 360bbb-3(b)(1), unless the authorization is terminated or revoked.  Performed at [facility], [address] [place], [place] [address]   Blood culture (routine x 2)     Status: None   Collection Time: 10/20/20 12:18 PM   Specimen: BLOOD  Result Value Ref Range Status   Specimen Description BLOOD  Final   Special Requests NONE  Final   Culture   Final    NO GROWTH 5 DAYS Performed at [facility], [address], [place], [place] [address]    Report Status 10/25/2020 FINAL  Final  Blood culture (routine x 2)     Status: None   Collection Time: 10/20/20 12:59 PM   Specimen: BLOOD RIGHT ARM  Result Value Ref Range Status   Specimen Description BLOOD RIGHT ARM  Final   Special Requests   Final    BOTTLES DRAWN AEROBIC AND ANAEROBIC Blood Culture adequate volume   Culture   Final    NO GROWTH 5 DAYS Performed at [facility], [address] [place], [place] [address]    Report  Status 10/25/2020 FINAL  Final  Urine culture     Status: None   Collection Time: 10/20/20  3:42 PM   Specimen: Urine, Catheterized  Result Value Ref Range Status   Specimen Description   Final    URINE, CATHETERIZED Performed at [facility], [address], [place], [place] [address]    Special Requests   Final    NONE Performed at [facility], [address], [place], [place] [address]    Culture   Final    NO GROWTH Performed at [facility], [place] [address] [place], [place] [address]    Report Status 10/21/2020 FINAL  Final  MRSA PCR Screening     Status: None   Collection Time: 10/20/20  6:42 PM   Specimen: Nasal Mucosa; Nasopharyngeal  Result Value Ref Range Status   MRSA by PCR NEGATIVE NEGATIVE Final    Comment:        The GeneXpert MRSA Assay (FDA approved for NASAL specimens only), is one component of a comprehensive MRSA colonization surveillance program. It is not intended to diagnose MRSA infection nor to guide or monitor treatment for MRSA infections. Performed at [facility], [address] [place] [address], [place], [place] [address]   Culture, respiratory  (non-expectorated)     Status: None   Collection Time: 10/21/20  8:20 AM   Specimen: Tracheal Aspirate; Respiratory  Result Value Ref Range Status   Specimen Description   Final    TRACHEAL ASPIRATE Performed at [facility], [address] [place], [place] [address]    Special Requests   Final    NONE Performed at [facility], [address] [place] [address] [place], [place] [address]    Gram Stain   Final    FEW WBC PRESENT, PREDOMINANTLY MONONUCLEAR NO ORGANISMS SEEN    Culture   Final    RARE Normal respiratory flora-no Staph aureus or Pseudomonas seen Performed at [place] [address] [place], [place] [address]    Report Status 10/23/2020 FINAL  Final  Culture, Urine     Status: None   Collection Time: 10/24/20 12:39 PM   Specimen: Urine, Catheterized  Result Value Ref Range Status   Specimen Description   Final    URINE, CATHETERIZED Performed at [facility], [address] [place], [place] [address]    Special Requests   Final    NONE Performed at [facility], [address] [place], [place] [address]    Culture   Final    NO GROWTH Performed at [facility], [place] [address] [place], [place] [address]    Report Status 10/25/2020 FINAL  Final  Culture, blood (Routine X 2) w Reflex to ID Panel     Status: None   Collection Time: 10/24/20  1:34 PM   Specimen: BLOOD RIGHT HAND  Result Value Ref Range Status   Specimen Description   Final    BLOOD RIGHT HAND BOTTLES DRAWN AEROBIC AND ANAEROBIC   Special Requests Blood Culture adequate volume  Final   Culture   Final    NO GROWTH 5 DAYS Performed at [facility], [address], [place], [place] [address]    Report Status 10/29/2020 FINAL  Final  Culture, blood (Routine X 2) w Reflex to ID Panel     Status: None   Collection Time: 10/24/20  1:44 PM   Specimen: BLOOD LEFT WRIST  Result Value Ref Range Status   Specimen Description   Final  BLOOD LEFT WRIST BOTTLES DRAWN AEROBIC AND ANAEROBIC   Special  Requests Blood Culture adequate volume  Final   Culture   Final    NO GROWTH 5 DAYS Performed at [facility], [address], [place], [place] [address]    Report Status 10/29/2020 FINAL  Final  Culture, respiratory (non-expectorated)     Status: None   Collection Time: 10/26/20  6:07 PM   Specimen: Tracheal Aspirate; Respiratory  Result Value Ref Range Status   Specimen Description   Final    TRACHEAL ASPIRATE Performed at [facility], [address] [place], [place] [address]    Special Requests   Final    Normal Performed at [facility], [address] [place], [place] [address]    Gram Stain   Final    FEW WBC PRESENT, PREDOMINANTLY PMN MODERATE GRAM POSITIVE RODS FEW GRAM POSITIVE COCCI FEW GRAM NEGATIVE RODS    Culture   Final    FEW Normal respiratory flora-no Staph aureus or Pseudomonas seen Performed at [facility], [place] [address] [place], [place] [address]    Report Status 10/29/2020 FINAL  Final    Coagulation Studies: No results for input(s): LABPROT, INR in the last 72 hours.  Urinalysis: Recent Labs    10/28/20 0429  COLORURINE YELLOW  LABSPEC 1.014  PHURINE 5.0  GLUCOSEU NEGATIVE  HGBUR SMALL*  BILIRUBINUR NEGATIVE  KETONESUR NEGATIVE  PROTEINUR 100*  NITRITE NEGATIVE  LEUKOCYTESUR NEGATIVE      Imaging: US RENAL  Result Date: 10/28/2020 CLINICAL DATA:  Chronic kidney disease.  Acute kidney injury. EXAM: RENAL / URINARY TRACT ULTRASOUND COMPLETE COMPARISON:  CT 06/12/2018 FINDINGS: Right Kidney: Renal measurements: 11.0 x 6.4 x 5.2 cm = volume: 190 mL. Increased echogenicity. No focal lesion or hydronephrosis. Left Kidney: Renal measurements: 11.2 x 7.6 x 6.2 cm = volume: 276 mL. Increased echogenicity. No focal lesion or hydronephrosis. Bladder: Foley catheter within the bladder. Other: None. IMPRESSION: Slightly enlarged kidneys showing increased echogenicity. Findings could be due to chronic diabetic glomerulonephropathy or acute  nephritis. No obstruction or focal lesion. Electronically Signed   By: Nelson Chimes M.D.   On: 10/28/2020 14:30     Medications:   . sodium chloride    . sodium chloride    . amiodarone    . ceFEPime (MAXIPIME) IV Stopped (10/29/20 2155)  . dexmedetomidine (PRECEDEX) IV infusion 1.2 mcg/kg/hr (10/30/20 0959)  . feeding supplement (VITAL HIGH PROTEIN) 1,000 mL (10/27/20 1604)  . heparin 2,100 Units/hr (10/30/20 1000)   . acetylcysteine  1 mL Nebulization Q6H  . albuterol  2.5 mg Nebulization Q6H  . chlorhexidine gluconate (MEDLINE KIT)  15 mL Mouth Rinse BID  . Chlorhexidine Gluconate Cloth  6 each Topical Daily  . free water  400 mL Per Tube Q2H  . mouth rinse  15 mL Mouth Rinse 2 times per day  . metoprolol tartrate  12.5 mg Per Tube BID  . pantoprazole sodium  40 mg Per Tube Q1200  . sodium chloride flush  3 mL Intravenous Q12H  . sodium chloride HYPERTONIC  4 mL Nebulization BID   sodium chloride, acetaminophen, fentaNYL (SUBLIMAZE) injection, LORazepam, metoprolol tartrate, ondansetron (ZOFRAN) IV, sodium chloride flush  Assessment/ Plan:   Acute on chronic kidney disease.  Renal ultrasound without obstruction holding diuretics at this point.  Baseline creatinine about 2.5 mg/dL.  Acute changes presumably secondary to cardiorenal syndrome possibly acute tubular necrosis.  No indications for dialysis at this time.  There  is no obstruction on renal ultrasound.  Continues to avoid nephrotoxins with daily urine output and serum creatinine.  Hypernatremia appears to be worsening despite adjustments to free water intake.  We will start IV D5W at 75 cc an hour  Acute exacerbation of congestive heart failure/cor pulmonale good urine output no IV diuresis  HTN/VOL-appears to be third spacing.  Atrial flutter on Eliquis  Acute respiratory failure trach ventilator.  Will check blood gas as bicarbonate is elevated.  May need increased rate off excess CO2  LOS: [place] '@TODAY' '@11' :02 AM

## 2020-10-31 ENCOUNTER — Inpatient Hospital Stay (HOSPITAL_COMMUNITY): Payer: Medicare Other

## 2020-10-31 LAB — RENAL FUNCTION PANEL
Albumin: 2.3 g/dL — ABNORMAL LOW (ref 3.5–5.0)
Anion gap: 12 (ref 5–15)
BUN: 106 mg/dL — ABNORMAL HIGH (ref 6–20)
CO2: 31 mmol/L (ref 22–32)
Calcium: 9.2 mg/dL (ref 8.9–10.3)
Chloride: 102 mmol/L (ref 98–111)
Creatinine, Ser: 4.21 mg/dL — ABNORMAL HIGH (ref 0.61–1.24)
GFR, Estimated: 16 mL/min — ABNORMAL LOW (ref >60)
Glucose, Bld: 155 mg/dL — ABNORMAL HIGH (ref 70–99)
Phosphorus: 4.2 mg/dL (ref 2.5–4.6)
Potassium: 4.1 mmol/L (ref 3.5–5.1)
Sodium: 145 mmol/L (ref 135–145)

## 2020-10-31 LAB — GLUCOSE, CAPILLARY
Glucose-Capillary: 134 mg/dL — ABNORMAL HIGH (ref 70–99)
Glucose-Capillary: 134 mg/dL — ABNORMAL HIGH (ref 70–99)
Glucose-Capillary: 126 mg/dL — ABNORMAL HIGH (ref 70–99)
Glucose-Capillary: 123 mg/dL — ABNORMAL HIGH (ref 70–99)
Glucose-Capillary: 121 mg/dL — ABNORMAL HIGH (ref 70–99)
Glucose-Capillary: 113 mg/dL — ABNORMAL HIGH (ref 70–99)

## 2020-10-31 LAB — CBC
HCT: 41.4 % (ref 39.0–52.0)
Hemoglobin: 11.5 g/dL — ABNORMAL LOW (ref 13.0–17.0)
MCH: 29.1 pg (ref 26.0–34.0)
MCHC: 27.8 g/dL — ABNORMAL LOW (ref 30.0–36.0)
MCV: 104.8 fL — ABNORMAL HIGH (ref 80.0–100.0)
Platelets: 169 10*3/uL (ref 150–400)
RBC: 3.95 MIL/uL — ABNORMAL LOW (ref 4.22–5.81)
RDW: 14.2 % (ref 11.5–15.5)
WBC: 8.5 10*3/uL (ref 4.0–10.5)
nRBC: 0 % (ref 0.0–0.2)

## 2020-10-31 LAB — HEPARIN LEVEL (UNFRACTIONATED): Heparin Unfractionated: 0.33 IU/mL (ref 0.30–0.70)

## 2020-10-31 MED ORDER — ALBUMIN HUMAN 25 % IV SOLN
12.5000 g | Freq: Once | INTRAVENOUS | Status: AC
  Administered 2020-10-31: 12.5 g via INTRAVENOUS
  Filled 2020-10-31: qty 50

## 2020-10-31 NOTE — Progress Notes (Signed)
ANTICOAGULATION CONSULT NOTE  Pharmacy Consult:  Heparin Indication: atrial fibrillation  No Known Allergies  Patient Measurements: Height: 5\' 7"  (170.2 cm) Weight: 135.9 kg (299 lb 9.7 oz) IBW/kg (Calculated) : 66.1 HEPARIN DW (KG): 103.8  Vital Signs: Temp: 98.7 F (37.1 C) (12/26 0803) Temp Source: Oral (12/26 0803) BP: 104/42 (12/26 0700) Pulse Rate: 63 (12/26 0700)  Labs: Recent Labs    10/29/20 0428 10/29/20 1514 10/30/20 0444 10/30/20 1537 10/31/20 0329  HGB 12.9*  --  12.7*  --  11.5*  HCT 44.3  --  45.0  --  41.4  PLT 179  --  189  --  169  HEPARINUNFRC 0.25*   < > 0.25* 0.34 0.33  CREATININE 4.83*  --  4.74*  --  4.21*   < > = values in this interval not displayed.    Estimated Creatinine Clearance: 26.7 mL/min (A) (by C-G formula based on SCr of 4.21 mg/dL (H)).  Assessment: Pharmacy consulted to dose heparin infusion for this 54 yo male with atrial fibrillation.  Patient was  taking apixaban PTA and was on treatment-dose Lovenox from 12-17 to 12-20. Heparin is being started due to patient's unstable renal function.   Heparin level is therapeutic on 2100 units/hr at 0.33. H&H stable, PLTs WNL.   Goal of Therapy:  Heparin level 0.3-0.7 units/ml Monitor platelets by anticoagulation protocol: Yes   Plan:  Continue heparin 2100 units/hr Daily HL, CBC Monitor for signs and symptoms of bleeding  Margot Ables, PharmD Clinical Pharmacist 10/31/2020 9:06 AM

## 2020-10-31 NOTE — Progress Notes (Signed)
PROGRESS NOTE  Jack Cox MEQ:[id number] DOB: [birth year] DOA: 10/20/2020 PCP: [name], MD   Brief History: 54 y.o.malewith medical history ofdiastolic CHF, atrial flutter, obstructive sleep apnea, hypertension, right bundle branch block presenting with shortness of breath for the last 2 to 3 days. Unfortunately, the patient is encephalopathic at the time of my evaluation; therefore, history is limited. Apparently, the patient went to see his nephrologist earlier on 10/20/2020. He was noted to have oxygen saturation in the 60s. As result, he was sent to the emergency department for further evaluation. Initially, the patient was awake and conversant with oxygen saturation in the low 70s. He was placed on a nonrebreather after which the patient became obtunded. VBG showed pH 7.1 66/97/106 on 80%. Subsequently, the patient was placed on the ventilator with PRVC,PEEP of 5, FiO2 70%. After approximately 10 to 15 minutes on the ventilator, the patient remained somnolent, but awakened and was able to tell me his name. He did state that he has not been taking his torsemide as directed. In addition, the patient has been sleeping in a recliner for an unclear duration of time secondary to shortness of breath. At baseline, the patient has a size 4 noncuffed tracheostomy. He is on supplemental oxygen 2.5 L during the day and 4 L at nighttime.  There is no reports of nausea, vomiting, diarrhea, chest pain, abdominal pain.  In the emergency department, the patient was afebrile and hemodynamically stable with oxygen saturation 93-94% on the ventilator with FiO2 70%. Chest x-ray showed left-sided pleural effusion with increased interstitial markings. BNP 790. PCT 0.12. BMP showed a serum creatinine 2.43. LFTs were unremarkable. WBC 8.1, hemoglobin 14.5, platelets 253,000. The patient was given ceftriaxone and azithromycin. He was also given furosemide 80 mg IV x1. COVID-19  RT-PCR negative.  He was continued on IV lasix 80 mg bid with uptrending of his serum creatinine.  Renal was consulted to assist.   Assessment/Plan: Acute on chronic respiratory failure with hypoxia and hypercarbia -Secondary to CHF in the setting of OSA -remainson ventilator -10/26/20 personally reviewed CXR--increased interstitial markings--?retrocardiac opacity -Pulmonary consultappreciated -PCT 0.12>>0.61 -having mucus plugging--needs aggressive suctioning -12/17 and 12/26 CXR--white out of left lung likely mucus plugging -continue mucomyst and albuterol -10/26/20-bronchoscopy--Thick inspissated mucus in proximal airways & BL lower lobes, removed with suctioning - hypertonic saline nebulizer treatments started by pulmonology 12/22 Vent Mode: PCV FiO2 (%):  [50 %-60 %] 60 % Set Rate:  [20 bmp] 20 bmp PEEP:  [5 cmH20] 5 cmH20 Pressure Support:  [10 cmH20] 10 cmH20 Plateau Pressure:  [14 cmH20-21 cmH20] 21 cmH20  Acute on chronic diastolic CHF/Cor Pulmonale -06/25/2019 echo EF 55%, trivial TR -12/16/21echo--EF 60-65%, no WMA, RV pressure overload, mild TR -he has been diuresing well with no diuretic support -Following BMP -Accurate I's and O's--NEG 13L -Daily weights I/O last 3 completed shifts: In: 4054.8 [I.V.:2963.4; IV Piggyback:1091.5] Out: 3225 [Urine:3225] Total I/O In: 359 [I.V.:159; NG/GT:200] Out: 450 [Urine:450]  Atrial flutter -apixaban on hold, he is on an IV heparin infusion -rate controlledbut had an episode 3-4 hours 12/18-12/19 evening -resumed low dose metoprolol 12.5 mg BID per tube   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  -Monitor with diuresis -consult nephrology--appreciated-->albumin+lasix bid per renal  FEN -had mild hypoglycemia -NG placed 12/17 -switchedto Vital High Protein and increase rate to 40 cc/hr-->hypoglycemia  improved  Hypernatremia -  improving after adding more free water to regimen, recheck in AM.   Fever - improving with pneumonia management -101.0 fever evening 12/18 and 100.7 12/21 -blood cultures x 2--neg to date -UA and urine culture--no significant pyuria -12/16 trach aspirate-->flora -10/26/20-->BAL sent for culture -completed course of cefepime  Essential hypertension -Anticipate improvement with diuresis -metoprolol low dose with holding parameters  OSA -The patient has a tracheostomy placed in 2001 -He has a size 4 cuffless trach  Right bundle branch block -This has been chronic with review of the medical records  Morbid obesity -BMI 52.94 -lifestyle modification  GOC -palliative following -currently full scope of care   Status is: Inpatient  Remains inpatient appropriate because:IV treatments appropriate due to intensity of illness or inability to take PO   Dispo: The patient is from:Home Anticipated d/c is UT:MLYY Anticipated d/c date is: >3 days Patient currently is not medically stable to d/c.   Family Communication:brotherupdated bedside 12/20-21  Consultants:pulm, renal  Code Status: FULL  DVT Prophylaxis: IV heparin infusion for full anticoagulation   Procedures: As Listed in Progress Note Above  Antibiotics: Cefepime 12/21>>  Subjective: Patient sedated  Objective: Vitals:   10/31/20 0800 10/31/20 0803 10/31/20 0900 10/31/20 1000  BP: 119/62  (!) 104/49 132/73  Pulse: 61  69 67  Resp: (!) 21  (!) 29 (!) 29  Temp:  98.7 F (37.1 C)    TempSrc:  Oral    SpO2: 95% 98% 100% 93%  Weight:      Height:        Intake/Output Summary (Last 24 hours) at 10/31/2020 1103 Last data filed at 10/31/2020 1053 Gross per 24 hour  Intake 3578.37 ml  Output 2675 ml  Net 903.37 ml   Weight change: 8.8 kg Exam:  General - pt is on vent. He is awake and alert, He is trying to  vocalize Neck - supple, no JVD.  Lungs: diminished BS on left side. No increased WOB.  CV - normal s1,s2 sounds. No MRG.  Abd - obese, soft, no masses palpated Ext - trace pretibial edema BLEs.  Neuro - nonfocal exam, pt is sedated.   Data Reviewed: I have personally reviewed following labs and imaging studies Basic Metabolic Panel: Recent Labs  Lab 10/26/20 0436 10/27/20 0428 10/28/20 0444 10/29/20 0428 10/30/20 0444 10/31/20 0329  NA 147* 149* 151* 153* 154* 145  K 4.1 5.1 4.9 4.5 4.5 4.1  CL 99 103 104 107 110 102  CO2 35* 36* 33* 34* 33* 31  GLUCOSE 124* 129* 139* 138* 123* 155*  BUN 60* 74* 95* 108* 102* 106*  CREATININE 2.40* 3.50* 4.94* 4.83* 4.74* 4.21*  CALCIUM 9.3 8.9 8.9 9.1 9.4 9.2  MG 1.9  --   --   --   --   --   PHOS 3.8 7.1* 5.5* 4.0 4.0 4.2   Liver Function Tests: Recent Labs  Lab 10/25/20 0618 10/26/20 0436 10/27/20 0428 10/28/20 0444 10/29/20 0428 10/30/20 0444 10/31/20 0329  AST 27  --   --   --   --   --   --   ALT 16  --   --   --   --   --   --   ALKPHOS 75  --   --   --   --   --   --   BILITOT 0.8  --   --   --   --   --   --   PROT 6.9  --   --   --   --   --   --  ALBUMIN 3.0*   < > 2.7* 2.5* 2.5* 2.6* 2.3*   < > = values in this interval not displayed.   No results for input(s): LIPASE, AMYLASE in the last 168 hours. No results for input(s): AMMONIA in the last 168 hours. Coagulation Profile: No results for input(s): INR, PROTIME in the last 168 hours. CBC: Recent Labs  Lab 10/27/20 0428 10/28/20 0444 10/29/20 0428 10/30/20 0444 10/31/20 0329  WBC 9.0 8.6 8.3 9.6 8.5  HGB 13.2 12.9* 12.9* 12.7* 11.5*  HCT 47.4 44.9 44.3 45.0 41.4  MCV 105.3* 102.0* 101.4* 103.2* 104.8*  PLT 195 170 179 189 169   Cardiac Enzymes: No results for input(s): CKTOTAL, CKMB, CKMBINDEX, TROPONINI in the last 168 hours. BNP: Invalid input(s): POCBNP CBG: Recent Labs  Lab 10/30/20 1634 10/30/20 1933 10/30/20 2304 10/31/20 0339  10/31/20 0843  GLUCAP 127* 151* 141* 134* 134*   HbA1C: No results for input(s): HGBA1C in the last 72 hours. Urine analysis:    Component Value Date/Time   COLORURINE YELLOW 10/28/2020 0429   APPEARANCEUR HAZY (A) 10/28/2020 0429   LABSPEC 1.014 10/28/2020 0429   PHURINE 5.0 10/28/2020 0429   GLUCOSEU NEGATIVE 10/28/2020 0429   HGBUR SMALL (A) 10/28/2020 0429   BILIRUBINUR NEGATIVE 10/28/2020 0429   KETONESUR NEGATIVE 10/28/2020 0429   PROTEINUR 100 (A) 10/28/2020 0429   NITRITE NEGATIVE 10/28/2020 0429   LEUKOCYTESUR NEGATIVE 10/28/2020 0429    Recent Results (from the past 240 hour(s))  Culture, Urine     Status: None   Collection Time: 10/24/20 12:39 PM   Specimen: Urine, Catheterized  Result Value Ref Range Status   Specimen Description   Final    URINE, CATHETERIZED Performed at [facility], [address], [place], [place] [address]    Special Requests   Final    NONE Performed at [facility], [address] [place], [place] [address]    Culture   Final    NO GROWTH Performed at [facility], [place] [address] [place], [place] [address]    Report Status 10/25/2020 FINAL  Final  Culture, blood (Routine X 2) w Reflex to ID Panel     Status: None   Collection Time: 10/24/20  1:34 PM   Specimen: BLOOD RIGHT HAND  Result Value Ref Range Status   Specimen Description   Final    BLOOD RIGHT HAND BOTTLES DRAWN AEROBIC AND ANAEROBIC   Special Requests Blood Culture adequate volume  Final   Culture   Final    NO GROWTH 5 DAYS Performed at [facility], [address], [place], [place] [address]    Report Status 10/29/2020 FINAL  Final  Culture, blood (Routine X 2) w Reflex to ID Panel     Status: None   Collection Time: 10/24/20  1:44 PM   Specimen: BLOOD LEFT WRIST  Result Value Ref Range Status   Specimen Description   Final    BLOOD LEFT WRIST BOTTLES DRAWN AEROBIC AND ANAEROBIC   Special Requests Blood Culture adequate volume  Final   Culture   Final     NO GROWTH 5 DAYS Performed at [facility], [address] [place], [place] [address]    Report Status 10/29/2020 FINAL  Final  Culture, respiratory (non-expectorated)     Status: None   Collection Time: 10/26/20  6:07 PM   Specimen: Tracheal Aspirate; Respiratory  Result Value Ref Range Status   Specimen Description   Final    TRACHEAL ASPIRATE Performed at  [place] [place], [place] [address]    Special Requests   Final    Normal Performed at [facility], [address], [place], [place] [address]    Gram Stain   Final    FEW WBC PRESENT, PREDOMINANTLY PMN MODERATE GRAM POSITIVE RODS FEW GRAM POSITIVE COCCI FEW GRAM NEGATIVE RODS    Culture   Final    FEW Normal respiratory flora-no Staph aureus or Pseudomonas seen Performed at [facility], [place] [address], [place], [place] [address]    Report Status 10/29/2020 FINAL  Final     Scheduled Meds: . acetylcysteine  1 mL Nebulization Q6H  . albuterol  2.5 mg Nebulization Q6H  . chlorhexidine gluconate (MEDLINE KIT)  15 mL Mouth Rinse BID  . Chlorhexidine Gluconate Cloth  6 each Topical Daily  . free water  400 mL Per Tube Q2H  . mouth rinse  15 mL Mouth Rinse 2 times per day  . metoprolol tartrate  12.5 mg Per Tube BID  . pantoprazole sodium  40 mg Per Tube Q1200  . sodium chloride flush  3 mL Intravenous Q12H  . sodium chloride HYPERTONIC  4 mL Nebulization BID   Continuous Infusions: . sodium chloride    . sodium chloride    . amiodarone    . ceFEPime (MAXIPIME) IV Stopped (10/31/20 0357)  . dexmedetomidine (PRECEDEX) IV infusion 1 mcg/kg/hr (10/31/20 0752)  . dextrose 75 mL/hr at 10/31/20 0704  . feeding supplement (VITAL HIGH PROTEIN) 40 mL/hr at 10/31/20 0200  . heparin 2,100 Units/hr (10/31/20 0704)   Procedures/Studies: US RENAL  Result Date: 10/28/2020 CLINICAL DATA:  Chronic kidney disease.  Acute kidney injury. EXAM: RENAL / URINARY TRACT ULTRASOUND COMPLETE COMPARISON:  CT  06/12/2018 FINDINGS: Right Kidney: Renal measurements: 11.0 x 6.4 x 5.2 cm = volume: 190 mL. Increased echogenicity. No focal lesion or hydronephrosis. Left Kidney: Renal measurements: 11.2 x 7.6 x 6.2 cm = volume: 276 mL. Increased echogenicity. No focal lesion or hydronephrosis. Bladder: Foley catheter within the bladder. Other: None. IMPRESSION: Slightly enlarged kidneys showing increased echogenicity. Findings could be due to chronic diabetic glomerulonephropathy or acute nephritis. No obstruction or focal lesion. Electronically Signed   By: Nelson Chimes M.D.   On: 10/28/2020 14:30   DG CHEST PORT 1 VIEW  Result Date: 10/31/2020 CLINICAL DATA:  Acute on chronic respiratory EXAM: PORTABLE CHEST 1 VIEW COMPARISON:  Failure four days ago FINDINGS: Enteric tube which reaches the stomach. Tracheostomy tube in place. Interval white out of the left chest with volume loss. Progressive hazy opacification of the right lower chest. Heart size is largely obscured. No visible air leak. IMPRESSION: 1. Interval white out of the left chest, likely plugging and pulmonary collapse. 2. Progressive airspace disease or pleural effusion on the right. Electronically Signed   By: Monte Fantasia M.D.   On: 10/31/2020 04:22   DG Chest Port 1 View  Result Date: 10/27/2020 CLINICAL DATA:  Acute respiratory failure EXAM: PORTABLE CHEST 1 VIEW COMPARISON:  Yesterday FINDINGS: Enteric and tracheostomy tubes remain in good position. Cardiomegaly. Interstitial opacity which could be bronchitic or congestive. There is improved aeration at the right base. Negative for pneumothorax. IMPRESSION: 1. Improved aeration. 2. Bronchitic or congestive interstitial opacity. Electronically Signed   By: Monte Fantasia M.D.   On: 10/27/2020 07:46   DG Chest Port 1 View  Result Date: 10/26/2020 CLINICAL DATA:  Shortness of breath for 2-3 days EXAM: PORTABLE CHEST 1 VIEW COMPARISON:  Three days ago FINDINGS: Progressive hazy appearance of the  right mid to lower chest. Dense retrocardiac opacity persists. An enteric tube and tracheostomy tube remain in place. Cardiomegaly and vascular pedicle widening. IMPRESSION: Pleural fluid/pulmonary opacification at the bases which has progressed from 3 days ago. Electronically Signed   By: Monte Fantasia M.D.   On: 10/26/2020 06:02   DG CHEST PORT 1 VIEW  Result Date: 10/23/2020 CLINICAL DATA:  Shortness of breath. EXAM: PORTABLE CHEST 1 VIEW COMPARISON:  October 22, 2020 FINDINGS: The near complete opacification of the left hemithorax seen on the comparison chest x-ray has resolved. Persistent bibasilar opacities are likely atelectasis. No pneumothorax. A tracheostomy tube is in good position. An NG tube terminates in the stomach. No other acute abnormalities. IMPRESSION: 1. Support apparatus as above. 2. Bibasilar atelectasis and a possible small left effusion. Improved aeration of the left lung. Electronically Signed   By: Dorise Bullion III M.D   On: 10/23/2020 14:57   DG Chest Port 1 View  Result Date: 10/22/2020 CLINICAL DATA:  Acute respiratory failure. EXAM: PORTABLE CHEST 1 VIEW COMPARISON:  10/20/2020 FINDINGS: Tracheostomy tube overlies the airway. The cardiac silhouette is enlarged. There is persistent mild central pulmonary vascular congestion without overt edema. Airspace opacities in the left greater than right mid and lower lungs have slightly improved. Small pleural effusions are questioned. No pneumothorax is identified. IMPRESSION: Slight improvement of bilateral airspace opacities which could reflect pneumonia or atelectasis. Electronically Signed   By: [name] M.D.   On: 10/22/2020 09:16   DG Chest Port 1 View  Result Date: 10/20/2020 CLINICAL DATA:  Questionable sepsis EXAM: PORTABLE CHEST 1 VIEW COMPARISON:  Chest x-ray 06/12/2018 report without images. FINDINGS: Tracheostomy tube terminates approximately 7.5 cm above the carina. The heart size and mediastinal contours  are not well visualized. Bilateral hilar vasculature prominence. Aortic arch calcifications. Silhouetting off of the left hemidiaphragm and left cardiac border. Partial silhouetting off the right cardiac border and right hemidiaphragm. Increased interstitial markings. Likely at least trace to small volume left pleural effusion. No pneumothorax. No acute osseous abnormality. IMPRESSION: 1. Left lung opacity and likely at least trace to small volume left pleural effusion with silhouetting off of the left diaphragm and heart border. Concern for infection/inflammation. 2. Right lower lobe and middle lobe airspace opacity could represent a combination of atelectasis, infection, inflammation. 3. At least mild pulmonary edema. 4. Consider chest x-ray PA and lateral view for further evaluation. Electronically Signed   By: Iven Finn M.D.   On: 10/20/2020 12:26   DG Chest Port 1V same Day  Result Date: 10/22/2020 CLINICAL DATA:  Status post NG tube placement. EXAM: PORTABLE CHEST 1 VIEW COMPARISON:  Single-view of the chest earlier today. FINDINGS: The upper chest is off the margin of the film. NG tube is looped in the upper trachea or esophagus. Visualized left chest is completely whited out on this study. IMPRESSION: NG tube is looped in the upper esophagus or trachea. Complete whiteout of the left chest is new since the study earlier today could be due to mucous plugging and left lung collapse or rapidly developing pleural effusion and airspace disease. Electronically Signed   By: [name] M.D.   On: 10/22/2020 15:16   DG Abd Portable 1V  Result Date: 10/22/2020 CLINICAL DATA:  Nasogastric tube placement EXAM: PORTABLE ABDOMEN - 1 VIEW COMPARISON:  Portable exam 1613 hours compared to 1523 hours FINDINGS: Tip of nasogastric tube projects over distal gastric antrum,  consider withdrawal 5 cm. Bowel gas pattern normal. Opacification of the inferior LEFT hemithorax again seen. IMPRESSION: Tip of  nasogastric tube projects over distal gastric antrum. Consider withdrawal of nasogastric tube 5 cm. Electronically Signed   By: [name] M.D.   On: 10/22/2020 16:36   DG Abd Portable 1V  Result Date: 10/22/2020 CLINICAL DATA:  Nasogastric tube placement EXAM: PORTABLE ABDOMEN - 1 VIEW COMPARISON:  None. FINDINGS: Nasogastric tube extends into the gastric cardia where it loops upon itself with the tip at the junction of the mid and distal thirds of the esophagus. There is no bowel dilatation or air-fluid level to suggest bowel obstruction. No free air. There is consolidation left lower lobe. IMPRESSION: Nasogastric tube extends into the stomach where it loops upon itself with the tip at the junction of the mid and distal thirds of the esophagus. No bowel obstruction or free air evident. Consolidation left lower lobe. These results will be called to the ordering clinician or representative by the Radiologist Assistant, and communication documented in the PACS or [employer]. Electronically Signed   By: Lowella Grip III M.D.   On: 10/22/2020 15:34   ECHOCARDIOGRAM COMPLETE  Result Date: 10/21/2020    ECHOCARDIOGRAM REPORT   Patient Name:   Jack Cox Date of Exam: 10/21/2020 Medical Rec #:  [id number]        Height:       68.0 in Accession #:    [id number]       Weight:       325.8 lb Date of Birth:  Feb 01, 1966        BSA:          2.515 m Patient Age:    29 years         BP:           112/61 mmHg Patient Gender: M                HR:           51 bpm. Exam Location:  [facility] Procedure: 2D Echo Indications:    CHF-Acute Diastolic I50.27  History:        Patient has no prior history of Echocardiogram examinations.                 CHF; Risk Factors:Non-Smoker. Acute on chronic respiratory                 failure with hypoxia.  Sonographer:    Leavy Cella RDCS (AE) Referring Phys: [phone] DAVID TAT IMPRESSIONS  1. Left ventricular ejection fraction, by estimation, is 60 to 65%. The left  ventricle has normal function. The left ventricle has no regional wall motion abnormalities. There is severe left ventricular hypertrophy. Left ventricular diastolic parameters  are indeterminate. There is the interventricular septum is flattened in systole and diastole, consistent with right ventricular pressure and volume overload.  2. Right ventricular systolic function is mildly reduced. The right ventricular size is mildly enlarged. Tricuspid regurgitation signal is inadequate for assessing PA pressure.  3. Right atrial size was upper normal.  4. The pericardial effusion is posterior to the left ventricle.  5. The mitral valve is grossly normal. Trivial mitral valve regurgitation.  6. The aortic valve is tricuspid. Aortic valve regurgitation is not visualized.  7. The inferior vena cava is normal in size with greater than 50% respiratory variability, suggesting right atrial pressure of 3 mmHg. FINDINGS  Left Ventricle: Left ventricular ejection fraction, by estimation,  is 60 to 65%. The left ventricle has normal function. The left ventricle has no regional wall motion abnormalities. The left ventricular internal cavity size was normal in size. There is  severe left ventricular hypertrophy. The interventricular septum is flattened in systole and diastole, consistent with right ventricular pressure and volume overload. Left ventricular diastolic parameters are indeterminate. Right Ventricle: The right ventricular size is mildly enlarged. No increase in right ventricular wall thickness. Right ventricular systolic function is mildly reduced. Tricuspid regurgitation signal is inadequate for assessing PA pressure. Left Atrium: Left atrial size was normal in size. Right Atrium: Right atrial size was upper normal. Pericardium: Trivial pericardial effusion is present. The pericardial effusion is posterior to the left ventricle. Mitral Valve: The mitral valve is grossly normal. Trivial mitral valve regurgitation.  Tricuspid Valve: The tricuspid valve is grossly normal. Tricuspid valve regurgitation is mild. Aortic Valve: The aortic valve is tricuspid. There is moderate aortic valve annular calcification. Aortic valve regurgitation is not visualized. Pulmonic Valve: The pulmonic valve was grossly normal. Pulmonic valve regurgitation is trivial. Aorta: The aortic root is normal in size and structure. Venous: The inferior vena cava is normal in size with greater than 50% respiratory variability, suggesting right atrial pressure of 3 mmHg. IAS/Shunts: No atrial level shunt detected by color flow Doppler.  LEFT VENTRICLE PLAX 2D LVIDd:         4.66 cm  Diastology LVIDs:         2.03 cm  LV e' medial:    5.87 cm/s LV PW:         1.76 cm  LV E/e' medial:  14.0 LV IVS:        1.73 cm  LV e' lateral:   8.49 cm/s LVOT diam:     1.90 cm  LV E/e' lateral: 9.7 LVOT Area:     2.84 cm  RIGHT VENTRICLE RV S prime:     19.40 cm/s TAPSE (M-mode): 1.9 cm LEFT ATRIUM             Index       RIGHT ATRIUM           Index LA diam:        3.50 cm 1.39 cm/m  RA Area:     24.60 cm LA Vol (A2C):   43.9 ml 17.46 ml/m RA Volume:   82.30 ml  32.73 ml/m LA Vol (A4C):   43.8 ml 17.42 ml/m LA Biplane Vol: 44.4 ml 17.66 ml/m   AORTA Ao Root diam: 3.10 cm MITRAL VALVE MV Area (PHT): 2.64 cm    SHUNTS MV Decel Time: 287 msec    Systemic Diam: 1.90 cm MV E velocity: 82.30 cm/s MV A velocity: 59.10 cm/s MV E/A ratio:  1.39 Rozann Lesches MD Electronically signed by Rozann Lesches MD Signature Date/Time: 10/21/2020/4:33:43 PM    Final     Critical Care Procedure Note Authorized and Performed by: Murvin Natal MD  Total Critical Care time: 31 mins  Due to a high probability of clinically significant, life threatening deterioration, the patient required my highest level of preparedness to intervene emergently and I personally spent this critical care time directly and personally managing the patient.  This critical care time included obtaining a  history; examining the patient, pulse oximetry; ordering and review of studies; arranging urgent treatment with development of a management plan; evaluation of patient's response of treatment; frequent reassessment; and discussions with other providers.  This critical care time was performed to assess and  manage the high probability of imminent and life threatening deterioration that could result in multi-organ failure.  It was exclusive of separately billable procedures and treating other patients and teaching time.   Irwin Brakeman, MD  How to contact the [facility] Attending or Consulting provider [place] or covering provider during after hours [place], for this patient?  1. Check the care team in [facility] and look for a) attending/consulting [facility] provider listed and b) the [facility] team listed 2. Log into [email or web address] and use [place]'s universal password to access. If you do not have the password, please contact the hospital operator. 3. Locate the [facility] provider you are looking for under [facility] and page to a number that you can be directly reached. 4. If you still have difficulty reaching the provider, please page the [facility] (Director on Call) for the Hospitalists listed on amion for assistance.  10/31/2020, 11:03 AM   LOS: 11 days

## 2020-10-31 NOTE — Progress Notes (Signed)
Patient ID: Jack Cox, male   DOB: October 03, 1966, 54 y.o.   MRN: [id number] S: No events overnight. O:BP (!) 119/43   Pulse 62   Temp 98.7 F (37.1 C) (Oral)   Resp (!) 22   Ht '5\' 7"'  (1.702 m)   Wt 135.9 kg   SpO2 93%   BMI 46.92 kg/m   Intake/Output Summary (Last 24 hours) at 10/31/2020 1115 Last data filed at 10/31/2020 1053 Gross per 24 hour  Intake 3578.37 ml  Output 2675 ml  Net 903.37 ml   Intake/Output: I/O last 3 completed shifts: In: 4054.8 [I.V.:2963.4; IV Piggyback:1091.5] Out: 3888 [Urine:3225]  Intake/Output this shift:  Total I/O In: 359 [I.V.:159; NG/GT:200] Out: 450 [Urine:450] Weight change: 8.8 kg Gen: on vent via trach in NAD CVS: RRR Resp: ventilated BS bilaterally Abd: obese, +BS, soft, NT Ext: 1+ anasarca  Recent Labs  Lab 10/25/20 0618 10/26/20 0436 10/27/20 0428 10/28/20 0444 10/29/20 0428 10/30/20 0444 10/31/20 0329  NA 145 147* 149* 151* 153* 154* 145  K 3.9 4.1 5.1 4.9 4.5 4.5 4.1  CL 98 99 103 104 107 110 102  CO2 37* 35* 36* 33* 34* 33* 31  GLUCOSE 120* 124* 129* 139* 138* 123* 155*  BUN 69* 60* 74* 95* 108* 102* 106*  CREATININE 2.84* 2.40* 3.50* 4.94* 4.83* 4.74* 4.21*  ALBUMIN 3.0* 3.1* 2.7* 2.5* 2.5* 2.6* 2.3*  CALCIUM 8.8* 9.3 8.9 8.9 9.1 9.4 9.2  PHOS 4.6 3.8 7.1* 5.5* 4.0 4.0 4.2  AST 27  --   --   --   --   --   --   ALT 16  --   --   --   --   --   --    Liver Function Tests: Recent Labs  Lab 10/25/20 0618 10/26/20 0436 10/29/20 0428 10/30/20 0444 10/31/20 0329  AST 27  --   --   --   --   ALT 16  --   --   --   --   ALKPHOS 75  --   --   --   --   BILITOT 0.8  --   --   --   --   PROT 6.9  --   --   --   --   ALBUMIN 3.0*   < > 2.5* 2.6* 2.3*   < > = values in this interval not displayed.   No results for input(s): LIPASE, AMYLASE in the last 168 hours. No results for input(s): AMMONIA in the last 168 hours. CBC: Recent Labs  Lab 10/27/20 0428 10/28/20 0444 10/29/20 0428 10/30/20 0444  10/31/20 0329  WBC 9.0 8.6 8.3 9.6 8.5  HGB 13.2 12.9* 12.9* 12.7* 11.5*  HCT 47.4 44.9 44.3 45.0 41.4  MCV 105.3* 102.0* 101.4* 103.2* 104.8*  PLT 195 170 179 189 169   Cardiac Enzymes: No results for input(s): CKTOTAL, CKMB, CKMBINDEX, TROPONINI in the last 168 hours. CBG: Recent Labs  Lab 10/30/20 1634 10/30/20 1933 10/30/20 2304 10/31/20 0339 10/31/20 0843  GLUCAP 127* 151* 141* 134* 134*    Iron Studies: No results for input(s): IRON, TIBC, TRANSFERRIN, FERRITIN in the last 72 hours. Studies/Results: DG CHEST PORT 1 VIEW  Result Date: 10/31/2020 CLINICAL DATA:  Acute on chronic respiratory EXAM: PORTABLE CHEST 1 VIEW COMPARISON:  Failure four days ago FINDINGS: Enteric tube which reaches the stomach. Tracheostomy tube in place. Interval white out of the left chest with volume loss. Progressive hazy opacification of the right lower  chest. Heart size is largely obscured. No visible air leak. IMPRESSION: 1. Interval white out of the left chest, likely plugging and pulmonary collapse. 2. Progressive airspace disease or pleural effusion on the right. Electronically Signed   By: Monte Fantasia M.D.   On: 10/31/2020 04:22   . acetylcysteine  1 mL Nebulization Q6H  . albuterol  2.5 mg Nebulization Q6H  . chlorhexidine gluconate (MEDLINE KIT)  15 mL Mouth Rinse BID  . Chlorhexidine Gluconate Cloth  6 each Topical Daily  . free water  400 mL Per Tube Q2H  . mouth rinse  15 mL Mouth Rinse 2 times per day  . metoprolol tartrate  12.5 mg Per Tube BID  . pantoprazole sodium  40 mg Per Tube Q1200  . sodium chloride flush  3 mL Intravenous Q12H  . sodium chloride HYPERTONIC  4 mL Nebulization BID    BMET    Component Value Date/Time   NA 145 10/31/2020 0329   K 4.1 10/31/2020 0329   CL 102 10/31/2020 0329   CO2 31 10/31/2020 0329   GLUCOSE 155 (H) 10/31/2020 0329   BUN 106 (H) 10/31/2020 0329   CREATININE 4.21 (H) 10/31/2020 0329   CALCIUM 9.2 10/31/2020 0329   GFRNONAA 16  (L) 10/31/2020 0329   CBC    Component Value Date/Time   WBC 8.5 10/31/2020 0329   RBC 3.95 (L) 10/31/2020 0329   HGB 11.5 (L) 10/31/2020 0329   HCT 41.4 10/31/2020 0329   PLT 169 10/31/2020 0329   MCV 104.8 (H) 10/31/2020 0329   MCH 29.1 10/31/2020 0329   MCHC 27.8 (L) 10/31/2020 0329   RDW 14.2 10/31/2020 0329   LYMPHSABS 0.9 10/20/2020 1217   MONOABS 0.8 10/20/2020 1217   EOSABS 0.1 10/20/2020 1217   BASOSABS 0.1 10/20/2020 1217     Assessment/Plan:  1. AKI/CKD stage IV- presumably cardiorenal syndrome but improved initially only to worsen over the past 48 hours. Likely progressed to ischemic ATN in setting of aggressive diuresis. He continues to make urine despite holding diuretics. No contrasted studies. He does not have any acute indications for dialysis at this time and any decision to undertake chronic dialysis will need to be after extensive discussions with the patient/family given his current comorbidities 1. BUN and Cr continue to slowly improve off of diuretics with good UOP. 2. Continue to hold diuretics and follow.  3. Renal US without obstruction 4. Given IV albumin to help with 3rd spacing and had brisk UOP 5. Avoid nephrotoxic agents such as NSAIDs (was on meloxicam at home), Cox-II I's, IV contrast, and phosphate containing bowel preparations 6. Continue to follow UOP and daily Scr. 7. No indication for dialysis at this time.  2. Acute exacerbation of diastolic CHF/cor pulmonale- he remains net negative 12 liters even off of IV lasix.  Has significant 3rd spacing due to hypoalbuminemia.  Redose with IV albumin prn. 3. 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 4. HTN- stable 5. Hypernatremia- did not respond to increase free water to 400 ml q4 and now on D5W at 75 ml/hr with improvement.  Continue at this rate for now and decrease as able.  6. Atrial flutter- on eliquis 7. Fever- tm 100.2 this morning.   On IV cefepime per primary 8. CKD stage IIIb/IV- normally followed by Dr. 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

## 2020-11-01 ENCOUNTER — Inpatient Hospital Stay (HOSPITAL_COMMUNITY): Payer: Medicare Other

## 2020-11-01 DIAGNOSIS — J9620 Acute and chronic respiratory failure, unspecified whether with hypoxia or hypercapnia: Secondary | ICD-10-CM | POA: Diagnosis not present

## 2020-11-01 LAB — RENAL FUNCTION PANEL
Albumin: 2.5 g/dL — ABNORMAL LOW (ref 3.5–5.0)
Anion gap: 12 (ref 5–15)
BUN: 102 mg/dL — ABNORMAL HIGH (ref 6–20)
CO2: 26 mmol/L (ref 22–32)
Calcium: 9.2 mg/dL (ref 8.9–10.3)
Chloride: 98 mmol/L (ref 98–111)
Creatinine, Ser: 3.43 mg/dL — ABNORMAL HIGH (ref 0.61–1.24)
GFR, Estimated: 20 mL/min — ABNORMAL LOW (ref >60)
Glucose, Bld: 136 mg/dL — ABNORMAL HIGH (ref 70–99)
Phosphorus: 4.6 mg/dL (ref 2.5–4.6)
Potassium: 3.9 mmol/L (ref 3.5–5.1)
Sodium: 136 mmol/L (ref 135–145)

## 2020-11-01 LAB — GLUCOSE, CAPILLARY
Glucose-Capillary: 102 mg/dL — ABNORMAL HIGH (ref 70–99)
Glucose-Capillary: 104 mg/dL — ABNORMAL HIGH (ref 70–99)
Glucose-Capillary: 111 mg/dL — ABNORMAL HIGH (ref 70–99)
Glucose-Capillary: 143 mg/dL — ABNORMAL HIGH (ref 70–99)
Glucose-Capillary: 146 mg/dL — ABNORMAL HIGH (ref 70–99)
Glucose-Capillary: 98 mg/dL (ref 70–99)

## 2020-11-01 LAB — CBC
HCT: 40.5 % (ref 39.0–52.0)
Hemoglobin: 11.9 g/dL — ABNORMAL LOW (ref 13.0–17.0)
MCH: 29.7 pg (ref 26.0–34.0)
MCHC: 29.4 g/dL — ABNORMAL LOW (ref 30.0–36.0)
MCV: 101 fL — ABNORMAL HIGH (ref 80.0–100.0)
Platelets: 172 10*3/uL (ref 150–400)
RBC: 4.01 MIL/uL — ABNORMAL LOW (ref 4.22–5.81)
RDW: 13.8 % (ref 11.5–15.5)
WBC: 7.3 10*3/uL (ref 4.0–10.5)
nRBC: 0 % (ref 0.0–0.2)

## 2020-11-01 LAB — HEPARIN LEVEL (UNFRACTIONATED): Heparin Unfractionated: 0.31 IU/mL (ref 0.30–0.70)

## 2020-11-01 MED ORDER — FREE WATER
400.0000 mL | Status: DC
  Administered 2020-11-01 – 2020-11-12 (×57): 400 mL

## 2020-11-01 MED ORDER — PROSOURCE TF PO LIQD
90.0000 mL | Freq: Three times a day (TID) | ORAL | Status: DC
  Administered 2020-11-01 – 2020-11-12 (×31): 90 mL
  Filled 2020-11-01 (×31): qty 90

## 2020-11-01 NOTE — Progress Notes (Signed)
ANTICOAGULATION CONSULT NOTE  Pharmacy Consult:  Heparin Indication: atrial fibrillation  No Known Allergies  Patient Measurements: Height: 5\' 7"  (170.2 cm) Weight: (!) 143.4 kg (316 lb 2.2 oz) IBW/kg (Calculated) : 66.1 HEPARIN DW (KG): 103.8  Vital Signs: Temp: 97.1 F (36.2 C) (12/27 0733) Temp Source: Axillary (12/27 0733) BP: 105/47 (12/27 0800) Pulse Rate: 67 (12/27 0800)  Labs: Recent Labs    10/30/20 0444 10/30/20 1537 10/31/20 0329 11/01/20 0737  HGB 12.7*  --  11.5*  --   HCT 45.0  --  41.4  --   PLT 189  --  169  --   HEPARINUNFRC 0.25* 0.34 0.33 0.31  CREATININE 4.74*  --  4.21* 3.43*    Estimated Creatinine Clearance: 33.8 mL/min (A) (by C-G formula based on SCr of 3.43 mg/dL (H)).  Assessment: Pharmacy consulted to dose heparin infusion for this 54 yo male with atrial fibrillation.  Patient was  taking apixaban PTA and was on treatment-dose Lovenox from 12-17 to 12-20. Heparin is being started due to patient's unstable renal function.   Heparin level is therapeutic on 2100 units/hr at 0.31. H&H stable, PLTs WNL.   Goal of Therapy:  Heparin level 0.3-0.7 units/ml Monitor platelets by anticoagulation protocol: Yes   Plan:  Continue heparin 2100 units/hr Daily HL, CBC Monitor for signs and symptoms of bleeding  Margot Ables, PharmD Clinical Pharmacist 11/01/2020 9:14 AM

## 2020-11-01 NOTE — Progress Notes (Signed)
Patient ID: Jack Cox, male   DOB: Jun 12, 1966, 54 y.o.   MRN: [id number] S: No events overnight. On vent. SNa down this AM on D5W.  SCr improved as well.  O:BP (!) 105/47   Pulse 67   Temp (!) 97.1 F (36.2 C) (Axillary)   Resp (!) 23   Ht _0  (1.702 m)   Wt (!) 143.4 kg   SpO2 98%   BMI 49.51 kg/m   Intake/Output Summary (Last 24 hours) at 11/01/2020 0910 Last data filed at 11/01/2020 0443 Gross per 24 hour  Intake 2568.34 ml  Output 2675 ml  Net -106.66 ml   Intake/Output: I/O last 3 completed shifts: In: 3159 [I.V.:2093.9; NG/GT:1602.7; IV Piggyback:1041.5] Out: 4585 [Urine:3450]  Intake/Output this shift:  No intake/output data recorded. Weight change: 7.5 kg Gen: on vent via trach in NAD CVS: RRR Resp: ventilated BS bilaterally Abd: obese, +BS, soft, NT Ext: 1+ anasarca  Recent Labs  Lab 10/26/20 0436 10/27/20 0428 10/28/20 0444 10/29/20 0428 10/30/20 0444 10/31/20 0329 11/01/20 0737  NA 147* 149* 151* 153* 154* 145 136  K 4.1 5.1 4.9 4.5 4.5 4.1 3.9  CL 99 103 104 107 110 102 98  CO2 35* 36* 33* 34* 33* 31 26  GLUCOSE 124* 129* 139* 138* 123* 155* 136*  BUN 60* 74* 95* 108* 102* 106* 102*  CREATININE 2.40* 3.50* 4.94* 4.83* 4.74* 4.21* 3.43*  ALBUMIN 3.1* 2.7* 2.5* 2.5* 2.6* 2.3* 2.5*  CALCIUM 9.3 8.9 8.9 9.1 9.4 9.2 9.2  PHOS 3.8 7.1* 5.5* 4.0 4.0 4.2 4.6   Liver Function Tests: Recent Labs  Lab 10/30/20 0444 10/31/20 0329 11/01/20 0737  ALBUMIN 2.6* 2.3* 2.5*   No results for input(s): LIPASE, AMYLASE in the last 168 hours. No results for input(s): AMMONIA in the last 168 hours. CBC: Recent Labs  Lab 10/27/20 0428 10/28/20 0444 10/29/20 0428 10/30/20 0444 10/31/20 0329  WBC 9.0 8.6 8.3 9.6 8.5  HGB 13.2 12.9* 12.9* 12.7* 11.5*  HCT 47.4 44.9 44.3 45.0 41.4  MCV 105.3* 102.0* 101.4* 103.2* 104.8*  PLT 195 170 179 189 169   Cardiac Enzymes: No results for input(s): CKTOTAL, CKMB, CKMBINDEX, TROPONINI in the last 168  hours. CBG: Recent Labs  Lab 10/31/20 1651 10/31/20 1944 10/31/20 2328 11/01/20 0408 11/01/20 0736  GLUCAP 123* 121* 113* 146* 143*    Iron Studies: No results for input(s): IRON, TIBC, TRANSFERRIN, FERRITIN in the last 72 hours. Studies/Results: DG CHEST PORT 1 VIEW  Result Date: 11/01/2020 CLINICAL DATA:  Acute on chronic respiratory failure. EXAM: PORTABLE CHEST 1 VIEW COMPARISON:  10/31/2020 FINDINGS: The tracheostomy tube is stable. The NG tube is stable. Much improved left lung aeration when compared to the prior study. The left upper lobe is now aerated. Persistent airspace process in the right lung and at the left lung base. No pleural effusions or pneumothorax. IMPRESSION: 1. Much improved left lung aeration when compared to the prior study. 2. Persistent right lung and left basilar airspace process. Electronically Signed   By: Marijo Sanes M.D.   On: 11/01/2020 05:55   DG CHEST PORT 1 VIEW  Result Date: 10/31/2020 CLINICAL DATA:  Acute on chronic respiratory EXAM: PORTABLE CHEST 1 VIEW COMPARISON:  Failure four days ago FINDINGS: Enteric tube which reaches the stomach. Tracheostomy tube in place. Interval white out of the left chest with volume loss. Progressive hazy opacification of the right lower chest. Heart size is largely obscured. No visible air leak. IMPRESSION: 1. Interval white out  of the left chest, likely plugging and pulmonary collapse. 2. Progressive airspace disease or pleural effusion on the right. Electronically Signed   By: Monte Fantasia M.D.   On: 10/31/2020 04:22   . acetylcysteine  1 mL Nebulization Q6H  . albuterol  2.5 mg Nebulization Q6H  . chlorhexidine gluconate (MEDLINE KIT)  15 mL Mouth Rinse BID  . Chlorhexidine Gluconate Cloth  6 each Topical Daily  . free water  400 mL Per Tube Q2H  . mouth rinse  15 mL Mouth Rinse 2 times per day  . metoprolol tartrate  12.5 mg Per Tube BID  . pantoprazole sodium  40 mg Per Tube Q1200  . sodium chloride  flush  3 mL Intravenous Q12H  . sodium chloride HYPERTONIC  4 mL Nebulization BID    BMET    Component Value Date/Time   NA 136 11/01/2020 0737   K 3.9 11/01/2020 0737   CL 98 11/01/2020 0737   CO2 26 11/01/2020 0737   GLUCOSE 136 (H) 11/01/2020 0737   BUN 102 (H) 11/01/2020 0737   CREATININE 3.43 (H) 11/01/2020 0737   CALCIUM 9.2 11/01/2020 0737   GFRNONAA 20 (L) 11/01/2020 0737   CBC    Component Value Date/Time   WBC 8.5 10/31/2020 0329   RBC 3.95 (L) 10/31/2020 0329   HGB 11.5 (L) 10/31/2020 0329   HCT 41.4 10/31/2020 0329   PLT 169 10/31/2020 0329   MCV 104.8 (H) 10/31/2020 0329   MCH 29.1 10/31/2020 0329   MCHC 27.8 (L) 10/31/2020 0329   RDW 14.2 10/31/2020 0329   LYMPHSABS 0.9 10/20/2020 1217   MONOABS 0.8 10/20/2020 1217   EOSABS 0.1 10/20/2020 1217   BASOSABS 0.1 10/20/2020 1217     Assessment/Plan:  1. AKI/CKD stage IV- presumably cardiorenal syndrome Adequate UOP w/o diuretics.  Downtrendign SCr, recovering GFR. 1. BUN and Cr continue to improve off of diuretics with good UOP. 2. Continue to hold diuretics and follow.  3. Renal US without obstruction 4. Given IV albumin to help with 3rd spacing and had brisk UOP 5. Avoid nephrotoxic agents such as NSAIDs (was on meloxicam at home), Cox-II I's, IV contrast, and phosphate containing bowel preparations 6. Continue to follow UOP and daily Scr. 7. No indication for dialysis at this time.  2. Acute exacerbation of diastolic CHF/cor pulmonale- he remains net negative 12 liters even off of IV lasix.  3. 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 4. HTN- stable 5. Hypernatremia- Quick normalization with parenteral free water, hold today and trend.  Try again to use enteral route to titrate to stable SNa. 6. Atrial flutter- on eliquis 7. Fevers, - ABX per primary 8. CKD stage IIIb/IV- normally followed by Dr. 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

## 2020-11-01 NOTE — Progress Notes (Signed)
NAME:  Jack Cox, MRN:  [id number], DOB:  [birth year], LOS: 12 ADMISSION DATE:  10/20/2020, CONSULTATION DATE:  10/20/20  REFERRING MD:  Tat, [facility]  CHIEF COMPLAINT:  Resp distress   Brief History   54 yo never smoker with MO complicated by severe OSA req trach 2001 last eval by [facility] 2018 also followed by ENT admit with increasing sob x 48 h pta s fever /purulent sputum and found to have severe hypercabic hypoxemic resp failure on arrival to ER am 12/15 so placed on vent and PCCM service requested to eval.   Wife reported pt not able to lie back on regular settings for 02/cpap x > 1 one week PTA assoc with acute on chronic leg swelling  History of present illness   54 y.o. male with medical history of diastolic CHF, atrial flutter, obstructive sleep apnea, hypertension, right bundle branch block presenting with shortness of breath for the last 2 to 3 days.    Apparently, the patient went to see his nephrologist earlier on 10/20/2020.  He was noted to have oxygen saturation in the 60s.  As result, he was sent to the emergency department for further evaluation.  Initially, the patient was awake and conversant with oxygen saturation in the low 70s.  He was placed on a nonrebreather after which the patient became obtunded.  VBG showed pH 7.1 66/97/106 on 80%.  Subsequently, the patient was placed on the ventilator with PRVC, PEEP of 5, FiO2 70%.  After approximately 10 to 15 minutes on the ventilator, the patient remained somnolent, but awakened and was able to say his name.  He did state that he has not been taking his torsemide as directed.  In addition, the patient has been sleeping in a recliner for an unclear duration of time secondary to shortness of breath.  At baseline, the patient has a size 4 noncuffed tracheostomy.  He is on supplemental oxygen 2.5 L during the day and 4 L at nighttime. There is no reports of nausea, vomiting, diarrhea, chest pain, abdominal pain. In the emergency  department, the patient was afebrile and hemodynamically stable with oxygen saturation 93-94% on the ventilator with FiO2 70%.  Chest x-ray showed left-sided pleural effusion with increased interstitial markings.  BNP 790.  PCT 0.12.  BMP showed a serum creatinine 2.43.  LFTs were unremarkable.  WBC 8.1, hemoglobin 14.5, platelets 253,000.  The patient was given ceftriaxone and azithromycin.  He was also given furosemide 80 mg IV x1.  COVID-19 RT-PCR negative   Past Medical History  CKD Proteinuria, Congestive heart failure   Essential hypertension  Gout Hypercholesteremia   Tracheostomy dependence    Significant Hospital Events    changed to PCV pm 12/17  Chest x-ray 12/17 left whiteout, resolved on chest x-ray 12/18 12/20 changed from propofol to Precedex 12/21 fever , bronch   Consults:  PCCM pm 12/15  Procedures:  New cuffed trach 12/15  Bronch bedside 12/21>> copious secretions  Significant Diagnostic Tests:   echo 12/16 ok LV There is the interventricular septum is flattened in  systole and diastole, consistent with right ventricular pressure and  volume overload.  2. Right ventricular systolic function is mildly reduced. The right  ventricular size is mildly enlarged. Tricuspid regurgitation signal is  inadequate for assessing PA pressure.  3. Right atrial size was upper normal/ RAP 23m     Micro Data:  Resp viral panel  12/15 neg MRSA  PCR  12/15 neg  BC x 2 12/15 >>>  Urine 12/15 >>>  Neg  Trach 12/16 > neg Urine legionella 12/16 >>>neg Urine strep 12/16 >  neg  BC x 2  12/19 neg  Urine 12/19 neg  BAL 12/21 >> GNR >>nl flora   Antimicrobials:  zmax  12/15  only Rocephin 12/15  only Cefepime12/21 >>  Scheduled Meds: . acetylcysteine  1 mL Nebulization Q6H  . albuterol  2.5 mg Nebulization Q6H  . chlorhexidine gluconate (MEDLINE KIT)  15 mL Mouth Rinse BID  . Chlorhexidine Gluconate Cloth  6 each Topical Daily  . free water  400 mL Per Tube Q4H  .  mouth rinse  15 mL Mouth Rinse 2 times per day  . metoprolol tartrate  12.5 mg Per Tube BID  . pantoprazole sodium  40 mg Per Tube Q1200  . sodium chloride flush  3 mL Intravenous Q12H  . sodium chloride HYPERTONIC  4 mL Nebulization BID   Continuous Infusions: . sodium chloride    . sodium chloride    . amiodarone    . ceFEPime (MAXIPIME) IV 2 g (10/31/20 2037)  . dexmedetomidine (PRECEDEX) IV infusion 0.8 mcg/kg/hr (11/01/20 1453)  . feeding supplement (VITAL HIGH PROTEIN) 1,000 mL (11/01/20 1344)  . heparin 2,100 Units/hr (11/01/20 1451)   PRN Meds:.   Interim history/subjective:  Oxygenation improved as is aeration on pCXR     Objective   Blood pressure (!) 128/54, pulse 71, temperature 98.2 F (36.8 C), temperature source Oral, resp. rate (!) 32, height _0  (1.702 m), weight (!) 143.4 kg, SpO2 96 %.    Vent Mode: CPAP FiO2 (%):  [40 %-50 %] 40 % Set Rate:  [20 bmp] 20 bmp PEEP:  [5 cmH20] 5 cmH20 Pressure Support:  [5 cmH20-10 cmH20] 10 cmH20 Plateau Pressure:  [17 cmH20-20 cmH20] 20 cmH20   Intake/Output Summary (Last 24 hours) at 11/01/2020 1525 Last data filed at 11/01/2020 0900 Gross per 24 hour  Intake --  Output 3175 ml  Net -3175 ml   Filed Weights   10/30/20 0320 10/31/20 0546 11/01/20 0442  Weight: 127.1 kg 135.9 kg (!) 143.4 kg    Examination: Tmax  98.2  Pt sedated /moderate increaese wob on PV No jvd Oropharynx clear Neck supple trach in place Lungs with a few scattered exp > insp rhonchi bilaterally RRR no s3 or or sign murmur Abd obese with  Poor  excursion  Extr warm with no edema or clubbing noted    I personally reviewed images and agree with radiology impression as follows:  CXR:   12/27 portable view 1. Much improved left lung aeration when compared to the prior study. 2. Persistent right lung and left basilar airspace process      Resolved Hospital Problem list     Assessment & Plan:  1) acute on chronic  hypoxemic/hypercarbic resp falure prob OHS  with vol overload  CAP Left lung atelectasis -resolved, status post bronchoscopy 12/21 for secretions >> very dyssynchronous on prvc when not sedated 12/17 > changed to PCV  - wean on PSV as tol / PCV is rest mode - d/c mucomyst 12/27  as has been on it over 28     2)  AKI on CKD with vol overload on ACEi PTA Lab Results  Component Value Date   CREATININE 3.43 (H) 11/01/2020   CREATININE 4.21 (H) 10/31/2020   CREATININE 4.74 (H) 10/30/2020   -  holding  acei indefinitely - nephrology following, off diuretics for now     3)  history of atrial flutter -  IV heparin / amiodarone drips    4) sedation needs -on Precedex + fent   -Goal RASS 0 to -1  5) severe OSA -last [facility] eval; 2019  progressed to tracheostomy#4  capped and using auto CPAP with goal of eventual decannulation, lost to follow-up since then - suspect heading      Best practice (evaluated daily)   Diet: TFs Pain/Anxiety/Delirium protocol (if indicated): Precedex/as needed fentanyl VAP protocol (if indicated): Y  DVT prophylaxis: IV heparin  GI prophylaxis: Protonix Glucose control: per triad Mobility: bed rest for now  last date of multidisciplinary goals of care:  Per triad , wife and 2 brothers updated 12/21  Code Status: full  Disposition: ICU [facility]  Labs   CBC: Recent Labs  Lab 10/28/20 0444 10/29/20 0428 10/30/20 0444 10/31/20 0329 11/01/20 1145  WBC 8.6 8.3 9.6 8.5 7.3  HGB 12.9* 12.9* 12.7* 11.5* 11.9*  HCT 44.9 44.3 45.0 41.4 40.5  MCV 102.0* 101.4* 103.2* 104.8* 101.0*  PLT 170 179 189 169 922    Basic Metabolic Panel: Recent Labs  Lab 10/26/20 0436 10/27/20 0428 10/28/20 0444 10/29/20 0428 10/30/20 0444 10/31/20 0329 11/01/20 0737  NA 147*   < > 151* 153* 154* 145 136  K 4.1   < > 4.9 4.5 4.5 4.1 3.9  CL 99   < > 104 107 110 102 98  CO2 35*   < > 33* 34* 33* 31 26  GLUCOSE 124*   < > 139* 138* 123* 155* 136*  BUN 60*   < > 95* 108*  102* 106* 102*  CREATININE 2.40*   < > 4.94* 4.83* 4.74* 4.21* 3.43*  CALCIUM 9.3   < > 8.9 9.1 9.4 9.2 9.2  MG 1.9  --   --   --   --   --   --   PHOS 3.8   < > 5.5* 4.0 4.0 4.2 4.6   < > = values in this interval not displayed.   GFR: Estimated Creatinine Clearance: 33.8 mL/min (A) (by C-G formula based on SCr of 3.43 mg/dL (H)). Recent Labs  Lab 10/29/20 0428 10/30/20 0444 10/31/20 0329 11/01/20 1145  WBC 8.3 9.6 8.5 7.3    Liver Function Tests: Recent Labs  Lab 10/28/20 0444 10/29/20 0428 10/30/20 0444 10/31/20 0329 11/01/20 0737  ALBUMIN 2.5* 2.5* 2.6* 2.3* 2.5*   No results for input(s): LIPASE, AMYLASE in the last 168 hours. No results for input(s): AMMONIA in the last 168 hours.  ABG    Component Value Date/Time   PHART 7.242 (L) 10/20/2020 2355   PCO2ART 79.3 (HH) 10/20/2020 2355   PO2ART 80.0 (L) 10/20/2020 2355   HCO3 27.6 10/20/2020 2355   O2SAT 93.6 10/20/2020 2355     Coagulation Profile: No results for input(s): INR, PROTIME in the last 168 hours.  Cardiac Enzymes: No results for input(s): CKTOTAL, CKMB, CKMBINDEX, TROPONINI in the last 168 hours.  HbA1C: No results found for: HGBA1C  CBG: Recent Labs  Lab 10/31/20 1944 10/31/20 2328 11/01/20 0408 11/01/20 0736 11/01/20 1138  GLUCAP 121* 113* 146* 143* 104*        The patient is critically ill with multiple organ systems failure and requires high complexity decision making for assessment and support, frequent evaluation and titration of therapies, application of advanced monitoring technologies and extensive interpretation of multiple databases. Critical Care Time devoted to patient care services described in this note is 35 minutes.  Christinia Gully, MD Pulmonary and [place] [phone]   After 7:00 pm call Elink  [phone]

## 2020-11-01 NOTE — Progress Notes (Signed)
PROGRESS NOTE  Jack Cox TOI:[id number] DOB: 1966-07-24 DOA: 10/20/2020 PCP: [name], MD   Brief History: 54 y.o.malewith medical history ofdiastolic CHF, atrial flutter, obstructive sleep apnea, hypertension, right bundle branch block presenting with shortness of breath for the last 2 to 3 days. Unfortunately, the patient is encephalopathic at the time of my evaluation; therefore, history is limited. Apparently, the patient went to see his nephrologist earlier on 10/20/2020. He was noted to have oxygen saturation in the 60s. As result, he was sent to the emergency department for further evaluation. Initially, the patient was awake and conversant with oxygen saturation in the low 70s. He was placed on a nonrebreather after which the patient became obtunded. VBG showed pH 7.1 66/97/106 on 80%. Subsequently, the patient was placed on the ventilator with PRVC,PEEP of 5, FiO2 70%. After approximately 10 to 15 minutes on the ventilator, the patient remained somnolent, but awakened and was able to tell me his name. He did state that he has not been taking his torsemide as directed. In addition, the patient has been sleeping in a recliner for an unclear duration of time secondary to shortness of breath. At baseline, the patient has a size 4 noncuffed tracheostomy. He is on supplemental oxygen 2.5 L during the day and 4 L at nighttime.  There is no reports of nausea, vomiting, diarrhea, chest pain, abdominal pain.  In the emergency department, the patient was afebrile and hemodynamically stable with oxygen saturation 93-94% on the ventilator with FiO2 70%. Chest x-ray showed left-sided pleural effusion with increased interstitial markings. BNP 790. PCT 0.12. BMP showed a serum creatinine 2.43. LFTs were unremarkable. WBC 8.1, hemoglobin 14.5, platelets 253,000. The patient was given ceftriaxone and azithromycin. He was also given furosemide 80 mg IV x1. COVID-19  RT-PCR negative.  He was continued on IV lasix 80 mg bid with uptrending of his serum creatinine.  Renal was consulted to assist.   Assessment/Plan: Acute on chronic respiratory failure with hypoxia and hypercarbia -Secondary to CHF in the setting of OSA -remainson ventilator with trach, pulmonary consulted to help with vent management -10/26/20 personally reviewed CXR--increased interstitial markings--?retrocardiac opacity -Pulmonary consultappreciated -PCT 0.12>>0.61 -having mucus plugging--needs aggressive suctioning -12/17 and 12/26 CXR--white out of left lung likely mucus plugging -continue mucomyst and albuterol -10/26/20-bronchoscopy--Thick inspissated mucus in proximal airways & BL lower lobes, removed with suctioning - hypertonic saline nebulizer treatments started by pulmonology 12/22 Vent Mode: CPAP FiO2 (%):  [40 %-60 %] 40 % Set Rate:  [20 bmp] 20 bmp PEEP:  [5 cmH20] 5 cmH20 Pressure Support:  [5 cmH20] 5 cmH20 Plateau Pressure:  [17 cmH20-22 cmH20] 20 cmH20  Acute on chronic diastolic CHF/Cor Pulmonale -06/25/2019 echo EF 55%, trivial TR -12/16/21echo--EF 60-65%, no WMA, RV pressure overload, mild TR -he has been diuresing well with no diuretic support -Following BMP -Accurate I's and O's--NEG 13L -Daily weights I/O last 3 completed shifts: In: 55 [I.V.:2093.9; NG/GT:1602.7; IV Piggyback:1041.5] Out: 8338 [Urine:3450] Total I/O In: -  Out: 950 [Urine:950]  Atrial flutter -apixaban on hold, he is on an IV heparin infusion -rate controlledbut had an episode 3-4 hours 12/18-12/19 evening -resumed low dose metoprolol 12.5 mg BID per tube   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  -Monitor with diuresis -consult nephrology--appreciated-->albumin+lasix bid per renal  FEN -had mild hypoglycemia -NG placed 12/17 -switchedto Vital High Protein and  increase rate to 40 cc/hr-->hypoglycemia improved  Hypernatremia - resolved now after adding more free water to regimen.    Fever - improving with pneumonia management -101.0 fever evening 12/18 and 100.7 12/21 -blood cultures x 2--neg to date -UA and urine culture--no significant pyuria -12/16 trach aspirate-->flora -10/26/20-->BAL sent for culture -completed course of cefepime  Essential hypertension -Anticipate improvement with diuresis -metoprolol low dose with holding parameters -BPs have been controlled  OSA -The patient has a tracheostomy placed in 2001 -He has a size 4 cuffless trach  Right bundle branch block -This has been chronic with review of the medical records  Morbid obesity -BMI 52.94 -lifestyle modification  GOC -palliative following -currently full scope of care   Status is: Inpatient  Remains inpatient appropriate because:IV treatments appropriate due to intensity of illness or inability to take PO   Dispo: The patient is from:Home Anticipated d/c is MM:HWKG Anticipated d/c date is: >3 days Patient currently is not medically stable to d/c.   Family Communication:brotherupdated bedside 12/20-21  Consultants:pulm, renal  Code Status: FULL  DVT Prophylaxis: IV heparin infusion for full anticoagulation   Procedures: As Listed in Progress Note Above  Antibiotics: Cefepime 12/21>>12/27  Subjective: Patient awake, remains on vent, c/o dry mouth  Objective: Vitals:   11/01/20 0900 11/01/20 1035 11/01/20 1100 11/01/20 1136  BP: (!) 114/37 127/64 122/67   Pulse: 69 78 79 77  Resp: (!) 28  20 (!) 21  Temp:    98.2 F (36.8 C)  TempSrc:    Oral  SpO2: 95% 98% 98% 96%  Weight:      Height:        Intake/Output Summary (Last 24 hours) at 11/01/2020 1142 Last data filed at 11/01/2020 0900 Gross per 24 hour  Intake 2368.34 ml  Output 3175 ml  Net -806.66 ml   Weight  change: 7.5 kg Exam:  General - pt is on vent. He is awake and alert, able to respond to queries.  Neck - supple, no JVD.  Lungs: diminished BS on left side. No increased WOB.  CV - normal s1,s2 sounds. No MRG.  Abd - obese, soft, no masses palpated Ext - trace pretibial edema BLEs.  Neuro - nonfocal exam, pt is sedated.   Data Reviewed: I have personally reviewed following labs and imaging studies  Basic Metabolic Panel: Recent Labs  Lab 10/26/20 0436 10/27/20 0428 10/28/20 0444 10/29/20 0428 10/30/20 0444 10/31/20 0329 11/01/20 0737  NA 147*   < > 151* 153* 154* 145 136  K 4.1   < > 4.9 4.5 4.5 4.1 3.9  CL 99   < > 104 107 110 102 98  CO2 35*   < > 33* 34* 33* 31 26  GLUCOSE 124*   < > 139* 138* 123* 155* 136*  BUN 60*   < > 95* 108* 102* 106* 102*  CREATININE 2.40*   < > 4.94* 4.83* 4.74* 4.21* 3.43*  CALCIUM 9.3   < > 8.9 9.1 9.4 9.2 9.2  MG 1.9  --   --   --   --   --   --   PHOS 3.8   < > 5.5* 4.0 4.0 4.2 4.6   < > = values in this interval not displayed.   Liver Function Tests: Recent Labs  Lab 10/28/20 0444 10/29/20 0428 10/30/20 0444 10/31/20 0329 11/01/20 0737  ALBUMIN 2.5* 2.5* 2.6* 2.3* 2.5*   No results for input(s): LIPASE, AMYLASE in the last 168 hours. No  results for input(s): AMMONIA in the last 168 hours. Coagulation Profile: No results for input(s): INR, PROTIME in the last 168 hours. CBC: Recent Labs  Lab 10/27/20 0428 10/28/20 0444 10/29/20 0428 10/30/20 0444 10/31/20 0329  WBC 9.0 8.6 8.3 9.6 8.5  HGB 13.2 12.9* 12.9* 12.7* 11.5*  HCT 47.4 44.9 44.3 45.0 41.4  MCV 105.3* 102.0* 101.4* 103.2* 104.8*  PLT 195 170 179 189 169   Cardiac Enzymes: No results for input(s): CKTOTAL, CKMB, CKMBINDEX, TROPONINI in the last 168 hours. BNP: Invalid input(s): POCBNP CBG: Recent Labs  Lab 10/31/20 1944 10/31/20 2328 11/01/20 0408 11/01/20 0736 11/01/20 1138  GLUCAP 121* 113* 146* 143* 104*   HbA1C: No results for input(s): HGBA1C  in the last 72 hours. Urine analysis:    Component Value Date/Time   COLORURINE YELLOW 10/28/2020 0429   APPEARANCEUR HAZY (A) 10/28/2020 0429   LABSPEC 1.014 10/28/2020 0429   PHURINE 5.0 10/28/2020 0429   GLUCOSEU NEGATIVE 10/28/2020 0429   HGBUR SMALL (A) 10/28/2020 0429   BILIRUBINUR NEGATIVE 10/28/2020 0429   KETONESUR NEGATIVE 10/28/2020 0429   PROTEINUR 100 (A) 10/28/2020 0429   NITRITE NEGATIVE 10/28/2020 0429   LEUKOCYTESUR NEGATIVE 10/28/2020 0429    Recent Results (from the past 240 hour(s))  Culture, Urine     Status: None   Collection Time: 10/24/20 12:39 PM   Specimen: Urine, Catheterized  Result Value Ref Range Status   Specimen Description   Final    URINE, CATHETERIZED Performed at [facility], [address] [place], [place] [address]    Special Requests   Final    NONE Performed at [facility], [address] [place], [place] [address]    Culture   Final    NO GROWTH Performed at [facility], [place] [address] [place], [place] [address]    Report Status 10/25/2020 FINAL  Final  Culture, blood (Routine X 2) w Reflex to ID Panel     Status: None   Collection Time: 10/24/20  1:34 PM   Specimen: BLOOD RIGHT HAND  Result Value Ref Range Status   Specimen Description   Final    BLOOD RIGHT HAND BOTTLES DRAWN AEROBIC AND ANAEROBIC   Special Requests Blood Culture adequate volume  Final   Culture   Final    NO GROWTH 5 DAYS Performed at [facility], [address], [place], [place] [address]    Report Status 10/29/2020 FINAL  Final  Culture, blood (Routine X 2) w Reflex to ID Panel     Status: None   Collection Time: 10/24/20  1:44 PM   Specimen: BLOOD LEFT WRIST  Result Value Ref Range Status   Specimen Description   Final    BLOOD LEFT WRIST BOTTLES DRAWN AEROBIC AND ANAEROBIC   Special Requests Blood Culture adequate volume  Final   Culture   Final    NO GROWTH 5 DAYS Performed at [facility], [address] [place], [place] [address]     Report Status 10/29/2020 FINAL  Final  Culture, respiratory (non-expectorated)     Status: None   Collection Time: 10/26/20  6:07 PM   Specimen: Tracheal Aspirate; Respiratory  Result Value Ref Range Status   Specimen Description   Final    TRACHEAL ASPIRATE Performed at [facility], [address] [place], [place] [address]    Special Requests   Final    Normal Performed at [facility], [address], [place], [place] [address]    Gram  Stain   Final    FEW WBC PRESENT, PREDOMINANTLY PMN MODERATE GRAM POSITIVE RODS FEW GRAM POSITIVE COCCI FEW GRAM NEGATIVE RODS    Culture   Final    FEW Normal respiratory flora-no Staph aureus or Pseudomonas seen Performed at [facility], [address], [place], [place] [address]    Report Status 10/29/2020 FINAL  Final     Scheduled Meds: . acetylcysteine  1 mL Nebulization Q6H  . albuterol  2.5 mg Nebulization Q6H  . chlorhexidine gluconate (MEDLINE KIT)  15 mL Mouth Rinse BID  . Chlorhexidine Gluconate Cloth  6 each Topical Daily  . free water  400 mL Per Tube Q2H  . mouth rinse  15 mL Mouth Rinse 2 times per day  . metoprolol tartrate  12.5 mg Per Tube BID  . pantoprazole sodium  40 mg Per Tube Q1200  . sodium chloride flush  3 mL Intravenous Q12H  . sodium chloride HYPERTONIC  4 mL Nebulization BID   Continuous Infusions: . sodium chloride    . sodium chloride    . amiodarone    . ceFEPime (MAXIPIME) IV 2 g (10/31/20 2037)  . dexmedetomidine (PRECEDEX) IV infusion 0.8 mcg/kg/hr (11/01/20 1115)  . feeding supplement (VITAL HIGH PROTEIN) 1,000 mL (10/31/20 1410)  . heparin 2,100 Units/hr (11/01/20 0218)   Procedures/Studies: US RENAL  Result Date: 10/28/2020 CLINICAL DATA:  Chronic kidney disease.  Acute kidney injury. EXAM: RENAL / URINARY TRACT ULTRASOUND COMPLETE COMPARISON:  CT 06/12/2018 FINDINGS: Right Kidney: Renal measurements: 11.0 x 6.4 x 5.2 cm = volume: 190 mL. Increased echogenicity. No focal lesion or  hydronephrosis. Left Kidney: Renal measurements: 11.2 x 7.6 x 6.2 cm = volume: 276 mL. Increased echogenicity. No focal lesion or hydronephrosis. Bladder: Foley catheter within the bladder. Other: None. IMPRESSION: Slightly enlarged kidneys showing increased echogenicity. Findings could be due to chronic diabetic glomerulonephropathy or acute nephritis. No obstruction or focal lesion. Electronically Signed   By: Nelson Chimes M.D.   On: 10/28/2020 14:30   DG CHEST PORT 1 VIEW  Result Date: 11/01/2020 CLINICAL DATA:  Acute on chronic respiratory failure. EXAM: PORTABLE CHEST 1 VIEW COMPARISON:  10/31/2020 FINDINGS: The tracheostomy tube is stable. The NG tube is stable. Much improved left lung aeration when compared to the prior study. The left upper lobe is now aerated. Persistent airspace process in the right lung and at the left lung base. No pleural effusions or pneumothorax. IMPRESSION: 1. Much improved left lung aeration when compared to the prior study. 2. Persistent right lung and left basilar airspace process. Electronically Signed   By: Marijo Sanes M.D.   On: 11/01/2020 05:55   DG CHEST PORT 1 VIEW  Result Date: 10/31/2020 CLINICAL DATA:  Acute on chronic respiratory EXAM: PORTABLE CHEST 1 VIEW COMPARISON:  Failure four days ago FINDINGS: Enteric tube which reaches the stomach. Tracheostomy tube in place. Interval white out of the left chest with volume loss. Progressive hazy opacification of the right lower chest. Heart size is largely obscured. No visible air leak. IMPRESSION: 1. Interval white out of the left chest, likely plugging and pulmonary collapse. 2. Progressive airspace disease or pleural effusion on the right. Electronically Signed   By: Monte Fantasia M.D.   On: 10/31/2020 04:22   DG Chest Port 1 View  Result Date: 10/27/2020 CLINICAL DATA:  Acute respiratory failure EXAM: PORTABLE CHEST 1 VIEW COMPARISON:  Yesterday FINDINGS: Enteric and tracheostomy tubes remain in good  position. Cardiomegaly. Interstitial opacity  which could be bronchitic or congestive. There is improved aeration at the right base. Negative for pneumothorax. IMPRESSION: 1. Improved aeration. 2. Bronchitic or congestive interstitial opacity. Electronically Signed   By: Monte Fantasia M.D.   On: 10/27/2020 07:46   DG Chest Port 1 View  Result Date: 10/26/2020 CLINICAL DATA:  Shortness of breath for 2-3 days EXAM: PORTABLE CHEST 1 VIEW COMPARISON:  Three days ago FINDINGS: Progressive hazy appearance of the right mid to lower chest. Dense retrocardiac opacity persists. An enteric tube and tracheostomy tube remain in place. Cardiomegaly and vascular pedicle widening. IMPRESSION: Pleural fluid/pulmonary opacification at the bases which has progressed from 3 days ago. Electronically Signed   By: Monte Fantasia M.D.   On: 10/26/2020 06:02   DG CHEST PORT 1 VIEW  Result Date: 10/23/2020 CLINICAL DATA:  Shortness of breath. EXAM: PORTABLE CHEST 1 VIEW COMPARISON:  October 22, 2020 FINDINGS: The near complete opacification of the left hemithorax seen on the comparison chest x-ray has resolved. Persistent bibasilar opacities are likely atelectasis. No pneumothorax. A tracheostomy tube is in good position. An NG tube terminates in the stomach. No other acute abnormalities. IMPRESSION: 1. Support apparatus as above. 2. Bibasilar atelectasis and a possible small left effusion. Improved aeration of the left lung. Electronically Signed   By: Dorise Bullion III M.D   On: 10/23/2020 14:57   DG Chest Port 1 View  Result Date: 10/22/2020 CLINICAL DATA:  Acute respiratory failure. EXAM: PORTABLE CHEST 1 VIEW COMPARISON:  10/20/2020 FINDINGS: Tracheostomy tube overlies the airway. The cardiac silhouette is enlarged. There is persistent mild central pulmonary vascular congestion without overt edema. Airspace opacities in the left greater than right mid and lower lungs have slightly improved. Small pleural effusions  are questioned. No pneumothorax is identified. IMPRESSION: Slight improvement of bilateral airspace opacities which could reflect pneumonia or atelectasis. Electronically Signed   By: [name] M.D.   On: 10/22/2020 09:16   DG Chest Port 1 View  Result Date: 10/20/2020 CLINICAL DATA:  Questionable sepsis EXAM: PORTABLE CHEST 1 VIEW COMPARISON:  Chest x-ray 06/12/2018 report without images. FINDINGS: Tracheostomy tube terminates approximately 7.5 cm above the carina. The heart size and mediastinal contours are not well visualized. Bilateral hilar vasculature prominence. Aortic arch calcifications. Silhouetting off of the left hemidiaphragm and left cardiac border. Partial silhouetting off the right cardiac border and right hemidiaphragm. Increased interstitial markings. Likely at least trace to small volume left pleural effusion. No pneumothorax. No acute osseous abnormality. IMPRESSION: 1. Left lung opacity and likely at least trace to small volume left pleural effusion with silhouetting off of the left diaphragm and heart border. Concern for infection/inflammation. 2. Right lower lobe and middle lobe airspace opacity could represent a combination of atelectasis, infection, inflammation. 3. At least mild pulmonary edema. 4. Consider chest x-ray PA and lateral view for further evaluation. Electronically Signed   By: Iven Finn M.D.   On: 10/20/2020 12:26   DG Chest Port 1V same Day  Result Date: 10/22/2020 CLINICAL DATA:  Status post NG tube placement. EXAM: PORTABLE CHEST 1 VIEW COMPARISON:  Single-view of the chest earlier today. FINDINGS: The upper chest is off the margin of the film. NG tube is looped in the upper trachea or esophagus. Visualized left chest is completely whited out on this study. IMPRESSION: NG tube is looped in the upper esophagus or trachea. Complete whiteout of the left chest is new since the study earlier today could be due to mucous plugging and  left lung collapse or  rapidly developing pleural effusion and airspace disease. Electronically Signed   By: [name] M.D.   On: 10/22/2020 15:16   DG Abd Portable 1V  Result Date: 10/22/2020 CLINICAL DATA:  Nasogastric tube placement EXAM: PORTABLE ABDOMEN - 1 VIEW COMPARISON:  Portable exam 1613 hours compared to 1523 hours FINDINGS: Tip of nasogastric tube projects over distal gastric antrum, consider withdrawal 5 cm. Bowel gas pattern normal. Opacification of the inferior LEFT hemithorax again seen. IMPRESSION: Tip of nasogastric tube projects over distal gastric antrum. Consider withdrawal of nasogastric tube 5 cm. Electronically Signed   By: [name] M.D.   On: 10/22/2020 16:36   DG Abd Portable 1V  Result Date: 10/22/2020 CLINICAL DATA:  Nasogastric tube placement EXAM: PORTABLE ABDOMEN - 1 VIEW COMPARISON:  None. FINDINGS: Nasogastric tube extends into the gastric cardia where it loops upon itself with the tip at the junction of the mid and distal thirds of the esophagus. There is no bowel dilatation or air-fluid level to suggest bowel obstruction. No free air. There is consolidation left lower lobe. IMPRESSION: Nasogastric tube extends into the stomach where it loops upon itself with the tip at the junction of the mid and distal thirds of the esophagus. No bowel obstruction or free air evident. Consolidation left lower lobe. These results will be called to the ordering clinician or representative by the Radiologist Assistant, and communication documented in the PACS or [employer]. Electronically Signed   By: Lowella Grip III M.D.   On: 10/22/2020 15:34   ECHOCARDIOGRAM COMPLETE  Result Date: 10/21/2020    ECHOCARDIOGRAM REPORT   Patient Name:   Jack Cox Date of Exam: 10/21/2020 Medical Rec #:  [id number]        Height:       68.0 in Accession #:    [id number]       Weight:       325.8 lb Date of Birth:  1966-10-31        BSA:          2.515 m Patient Age:    65 years         BP:            112/61 mmHg Patient Gender: M                HR:           51 bpm. Exam Location:  [facility] Procedure: 2D Echo Indications:    CHF-Acute Diastolic N39.76  History:        Patient has no prior history of Echocardiogram examinations.                 CHF; Risk Factors:Non-Smoker. Acute on chronic respiratory                 failure with hypoxia.  Sonographer:    Leavy Cella RDCS (AE) Referring Phys: [phone] DAVID TAT IMPRESSIONS  1. Left ventricular ejection fraction, by estimation, is 60 to 65%. The left ventricle has normal function. The left ventricle has no regional wall motion abnormalities. There is severe left ventricular hypertrophy. Left ventricular diastolic parameters  are indeterminate. There is the interventricular septum is flattened in systole and diastole, consistent with right ventricular pressure and volume overload.  2. Right ventricular systolic function is mildly reduced. The right ventricular size is mildly enlarged. Tricuspid regurgitation signal is inadequate for assessing PA pressure.  3. Right atrial size was upper normal.  4.  The pericardial effusion is posterior to the left ventricle.  5. The mitral valve is grossly normal. Trivial mitral valve regurgitation.  6. The aortic valve is tricuspid. Aortic valve regurgitation is not visualized.  7. The inferior vena cava is normal in size with greater than 50% respiratory variability, suggesting right atrial pressure of 3 mmHg. FINDINGS  Left Ventricle: Left ventricular ejection fraction, by estimation, is 60 to 65%. The left ventricle has normal function. The left ventricle has no regional wall motion abnormalities. The left ventricular internal cavity size was normal in size. There is  severe left ventricular hypertrophy. The interventricular septum is flattened in systole and diastole, consistent with right ventricular pressure and volume overload. Left ventricular diastolic parameters are indeterminate. Right Ventricle: The right  ventricular size is mildly enlarged. No increase in right ventricular wall thickness. Right ventricular systolic function is mildly reduced. Tricuspid regurgitation signal is inadequate for assessing PA pressure. Left Atrium: Left atrial size was normal in size. Right Atrium: Right atrial size was upper normal. Pericardium: Trivial pericardial effusion is present. The pericardial effusion is posterior to the left ventricle. Mitral Valve: The mitral valve is grossly normal. Trivial mitral valve regurgitation. Tricuspid Valve: The tricuspid valve is grossly normal. Tricuspid valve regurgitation is mild. Aortic Valve: The aortic valve is tricuspid. There is moderate aortic valve annular calcification. Aortic valve regurgitation is not visualized. Pulmonic Valve: The pulmonic valve was grossly normal. Pulmonic valve regurgitation is trivial. Aorta: The aortic root is normal in size and structure. Venous: The inferior vena cava is normal in size with greater than 50% respiratory variability, suggesting right atrial pressure of 3 mmHg. IAS/Shunts: No atrial level shunt detected by color flow Doppler.  LEFT VENTRICLE PLAX 2D LVIDd:         4.66 cm  Diastology LVIDs:         2.03 cm  LV e' medial:    5.87 cm/s LV PW:         1.76 cm  LV E/e' medial:  14.0 LV IVS:        1.73 cm  LV e' lateral:   8.49 cm/s LVOT diam:     1.90 cm  LV E/e' lateral: 9.7 LVOT Area:     2.84 cm  RIGHT VENTRICLE RV S prime:     19.40 cm/s TAPSE (M-mode): 1.9 cm LEFT ATRIUM             Index       RIGHT ATRIUM           Index LA diam:        3.50 cm 1.39 cm/m  RA Area:     24.60 cm LA Vol (A2C):   43.9 ml 17.46 ml/m RA Volume:   82.30 ml  32.73 ml/m LA Vol (A4C):   43.8 ml 17.42 ml/m LA Biplane Vol: 44.4 ml 17.66 ml/m   AORTA Ao Root diam: 3.10 cm MITRAL VALVE MV Area (PHT): 2.64 cm    SHUNTS MV Decel Time: 287 msec    Systemic Diam: 1.90 cm MV E velocity: 82.30 cm/s MV A velocity: 59.10 cm/s MV E/A ratio:  1.39 Rozann Lesches MD  Electronically signed by Rozann Lesches MD Signature Date/Time: 10/21/2020/4:33:43 PM    Final    Critical Care Procedure Note Authorized and Performed by: Murvin Natal MD  Total Critical Care time: 34 mins  Due to a high probability of clinically significant, life threatening deterioration, the patient required my highest level of preparedness to intervene  emergently and I personally spent this critical care time directly and personally managing the patient.  This critical care time included obtaining a history; examining the patient, pulse oximetry; ordering and review of studies; arranging urgent treatment with development of a management plan; evaluation of patient's response of treatment; frequent reassessment; and discussions with other providers.  This critical care time was performed to assess and manage the high probability of imminent and life threatening deterioration that could result in multi-organ failure.  It was exclusive of separately billable procedures and treating other patients and teaching time.   Irwin Brakeman, MD  How to contact the [facility] Attending or Consulting provider [place] or covering provider during after hours [place], for this patient?  1. Check the care team in [facility] and look for a) attending/consulting TR[facility] provider listed and b) the [facility] team listed 2. Log into [email or web address] and use [place]'s universal password to access. If you do not have the password, please contact the hospital operator. 3. Locate the [facility] provider you are looking for under [facility] and page to a number that you can be directly reached. 4. If you still have difficulty reaching the provider, please page the [facility] (Director on Call) for the Hospitalists listed on amion for assistance.  11/01/2020, 11:42 AM   LOS: 12 days

## 2020-11-02 ENCOUNTER — Inpatient Hospital Stay (HOSPITAL_COMMUNITY): Payer: Medicare Other

## 2020-11-02 DIAGNOSIS — J9620 Acute and chronic respiratory failure, unspecified whether with hypoxia or hypercapnia: Secondary | ICD-10-CM | POA: Diagnosis not present

## 2020-11-02 DIAGNOSIS — J9811 Atelectasis: Secondary | ICD-10-CM | POA: Diagnosis not present

## 2020-11-02 LAB — HEPARIN LEVEL (UNFRACTIONATED)
Heparin Unfractionated: 0.24 IU/mL — ABNORMAL LOW (ref 0.30–0.70)
Heparin Unfractionated: 0.29 IU/mL — ABNORMAL LOW (ref 0.30–0.70)

## 2020-11-02 LAB — GLUCOSE, CAPILLARY
Glucose-Capillary: 102 mg/dL — ABNORMAL HIGH (ref 70–99)
Glucose-Capillary: 114 mg/dL — ABNORMAL HIGH (ref 70–99)
Glucose-Capillary: 116 mg/dL — ABNORMAL HIGH (ref 70–99)
Glucose-Capillary: 118 mg/dL — ABNORMAL HIGH (ref 70–99)
Glucose-Capillary: 120 mg/dL — ABNORMAL HIGH (ref 70–99)
Glucose-Capillary: 98 mg/dL (ref 70–99)

## 2020-11-02 LAB — RENAL FUNCTION PANEL
Albumin: 2.6 g/dL — ABNORMAL LOW (ref 3.5–5.0)
Anion gap: 12 (ref 5–15)
BUN: 104 mg/dL — ABNORMAL HIGH (ref 6–20)
CO2: 26 mmol/L (ref 22–32)
Calcium: 9.4 mg/dL (ref 8.9–10.3)
Chloride: 101 mmol/L (ref 98–111)
Creatinine, Ser: 3.16 mg/dL — ABNORMAL HIGH (ref 0.61–1.24)
GFR, Estimated: 22 mL/min — ABNORMAL LOW (ref >60)
Glucose, Bld: 118 mg/dL — ABNORMAL HIGH (ref 70–99)
Phosphorus: 5.1 mg/dL — ABNORMAL HIGH (ref 2.5–4.6)
Potassium: 4.3 mmol/L (ref 3.5–5.1)
Sodium: 139 mmol/L (ref 135–145)

## 2020-11-02 MED ORDER — HEPARIN BOLUS VIA INFUSION
1500.0000 [IU] | Freq: Once | INTRAVENOUS | Status: AC
  Administered 2020-11-02: 1500 [IU] via INTRAVENOUS
  Filled 2020-11-02: qty 1500

## 2020-11-02 NOTE — Progress Notes (Signed)
NAME:  Jack Cox, MRN:  [id number], DOB:  01-15-1966, LOS: 68 ADMISSION DATE:  10/20/2020, CONSULTATION DATE:  10/20/20  REFERRING MD:  Tat, [facility]  CHIEF COMPLAINT:  Resp distress   Brief History   54 yobm  never smoker with MO complicated by severe OSA req trach 2001 last eval by [facility] 2018 also followed by ENT admit with increasing sob x 48 h pta s fever /purulent sputum and found to have severe hypercabic hypoxemic resp failure on arrival to ER am 12/15 so placed on vent and PCCM service requested to eval.   Wife reported pt not able to lie back on regular settings for 02/cpap x > 1 one week PTA assoc with acute on chronic leg swelling  History of present illness   54 y.o. male with medical history of diastolic CHF, atrial flutter, obstructive sleep apnea, hypertension, right bundle branch block presenting with shortness of breath for the last 2 to 3 days.    Apparently, the patient went to see his nephrologist earlier on 10/20/2020.  He was noted to have oxygen saturation in the 60s.  As result, he was sent to the emergency department for further evaluation.  Initially, the patient was awake and conversant with oxygen saturation in the low 70s.  He was placed on a nonrebreather after which the patient became obtunded.  VBG showed pH 7.1 66/97/106 on 80%.  Subsequently, the patient was placed on the ventilator with PRVC, PEEP of 5, FiO2 70%.  After approximately 10 to 15 minutes on the ventilator, the patient remained somnolent, but awakened and was able to say his name.  He did state that he has not been taking his torsemide as directed.  In addition, the patient has been sleeping in a recliner for an unclear duration of time secondary to shortness of breath.  At baseline, the patient has a size 4 noncuffed tracheostomy.  He is on supplemental oxygen 2.5 L during the day and 4 L at nighttime. There is no reports of nausea, vomiting, diarrhea, chest pain, abdominal pain. In the emergency  department, the patient was afebrile and hemodynamically stable with oxygen saturation 93-94% on the ventilator with FiO2 70%.  Chest x-ray showed left-sided pleural effusion with increased interstitial markings.  BNP 790.  PCT 0.12.  BMP showed a serum creatinine 2.43.  LFTs were unremarkable.  WBC 8.1, hemoglobin 14.5, platelets 253,000.  The patient was given ceftriaxone and azithromycin.  He was also given furosemide 80 mg IV x1.  COVID-19 RT-PCR negative   Past Medical History  CKD Proteinuria, Congestive heart failure   Essential hypertension  Gout Hypercholesteremia   Tracheostomy dependence    Significant Hospital Events    changed to PCV pm 12/17  Chest x-ray 12/17 left whiteout, resolved on chest x-ray 12/18 12/20 changed from propofol to Precedex 12/21 fever , bronch   Consults:  PCCM pm 12/15  Procedures:  New cuffed trach 12/15  Bronch bedside 12/21>> copious secretions  Significant Diagnostic Tests:   echo 12/16 ok LV There is the interventricular septum is flattened in  systole and diastole, consistent with right ventricular pressure and  volume overload.  2. Right ventricular systolic function is mildly reduced. The right  ventricular size is mildly enlarged. Tricuspid regurgitation signal is  inadequate for assessing PA pressure.  3. Right atrial size was upper normal/ RAP 41m     Micro Data:  Resp viral panel  12/15 neg MRSA  PCR  12/15 neg  BC x 2  12/15 >>> Urine 12/15 >>>  Neg  Trach 12/16 > neg Urine legionella 12/16 >>>neg Urine strep 12/16 >  neg  BC x 2  12/19 neg  Urine 12/19 neg  BAL 12/21 >> GNR >>nl flora   Antimicrobials:  zmax  12/15  only Rocephin 12/15  only Cefepime12/21 >> 12/27   Scheduled Meds: . albuterol  2.5 mg Nebulization Q6H  . chlorhexidine gluconate (MEDLINE KIT)  15 mL Mouth Rinse BID  . Chlorhexidine Gluconate Cloth  6 each Topical Daily  . feeding supplement (PROSource TF)  90 mL Per Tube TID  . free water   400 mL Per Tube Q4H  . mouth rinse  15 mL Mouth Rinse 2 times per day  . metoprolol tartrate  12.5 mg Per Tube BID  . pantoprazole sodium  40 mg Per Tube Q1200  . sodium chloride flush  3 mL Intravenous Q12H   Continuous Infusions: . sodium chloride    . sodium chloride    . amiodarone    . dexmedetomidine (PRECEDEX) IV infusion 1.2 mcg/kg/hr (11/02/20 1034)  . feeding supplement (VITAL HIGH PROTEIN) 1,000 mL (11/01/20 1344)  . heparin 2,300 Units/hr (11/02/20 0849)   PRN Meds:.   Interim history/subjective:  Much more alert today.  Now willing to work toward  Getting  up in chair - When I said "jump"  He mouthed "how high"  Tolerating psv wean well    Objective   Blood pressure 111/60, pulse 69, temperature 98.3 F (36.8 C), temperature source Oral, resp. rate (!) 26, height _0  (1.702 m), weight 130.5 kg, SpO2 99 %.    Vent Mode: PSV;CPAP FiO2 (%):  [40 %] 40 % Set Rate:  [20 bmp] 20 bmp PEEP:  [5 cmH20] 5 cmH20 Pressure Support:  [10 cmH20] 10 cmH20 Plateau Pressure:  [17 cmH20] 17 cmH20   Intake/Output Summary (Last 24 hours) at 11/02/2020 1242 Last data filed at 11/02/2020 1024 Gross per 24 hour  Intake 2217.74 ml  Output 3575 ml  Net -1357.26 ml   Filed Weights   10/31/20 0546 11/01/20 0442 11/02/20 0500  Weight: 135.9 kg (!) 143.4 kg 130.5 kg    Examination: Tmax  98.8  Pt alert, approp nad @ 30 degrees hop Oropharynx clear,  mucosa nl Neck supple/ trach in place Lungs with a few scattered exp > insp rhonchi bilaterally RRR no s3 or or sign murmur Abd obese with limited excursion/ tol tf   Extr warm with trace  edema   Neuro  Sensorium appears intact,  no apparent motor deficits      Resolved Hospital Problem list     Assessment & Plan:   1) acute on chronic hypoxemic/hypercarbic resp falure prob OHS  with vol overload  CAP Left lung atelectasis -resolved, status post bronchoscopy 12/21 for secretions >> dyssynchronous on prvc when not sedated  12/17 > changed to PCV  - wean on PSV as tol / PCV is rest mode -  d/c'd mucomyst 12/27  Will observe for recurrent mucus plugs - completed maxepime 12/27 as per flowsheet     2)  AKI on CKD with vol overload on ACEi PTA Lab Results  Component Value Date   CREATININE 3.16 (H) 11/02/2020   CREATININE 3.43 (H) 11/01/2020   CREATININE 4.21 (H) 10/31/2020   -  holding  acei indefinitely -   nephrology following, off diuretics for now     3) history of atrial flutter -  IV heparin / amiodarone drip  4) sedation needs -on Precedex + fent   -Goal RASS 0 to -1  5) severe OSA -last [facility] eval; 2019  progressed to tracheostomy#4  capped and using auto CPAP with goal of eventual decannulation, lost to follow-up since then - suspect heading toward ltac      Best practice (evaluated daily)   Diet: TFs Pain/Anxiety/Delirium protocol (if indicated): Precedex/as needed fentanyl VAP protocol (if indicated): Y  DVT prophylaxis: IV heparin  GI prophylaxis: Protonix Glucose control: per [facility] Mobility: start to mobilize to chair last date of multidisciplinary goals of care:  Per [facility] , wife and 2 brothers updated 12/21  Code Status: full  Disposition: ICU [facility]  Labs   CBC: Recent Labs  Lab 10/28/20 0444 10/29/20 0428 10/30/20 0444 10/31/20 0329 11/01/20 1145  WBC 8.6 8.3 9.6 8.5 7.3  HGB 12.9* 12.9* 12.7* 11.5* 11.9*  HCT 44.9 44.3 45.0 41.4 40.5  MCV 102.0* 101.4* 103.2* 104.8* 101.0*  PLT 170 179 189 169 244    Basic Metabolic Panel: Recent Labs  Lab 10/29/20 0428 10/30/20 0444 10/31/20 0329 11/01/20 0737 11/02/20 0440  NA 153* 154* 145 136 139  K 4.5 4.5 4.1 3.9 4.3  CL 107 110 102 98 101  CO2 34* 33* _0 GLUCOSE 138* 123* 155* 136* 118*  BUN 108* 102* 106* 102* 104*  CREATININE 4.83* 4.74* 4.21* 3.43* 3.16*  CALCIUM 9.1 9.4 9.2 9.2 9.4  PHOS 4.0 4.0 4.2 4.6 5.1*   GFR: Estimated Creatinine Clearance: 34.7 mL/min (A) (by C-G formula based on SCr of  3.16 mg/dL (H)). Recent Labs  Lab 10/29/20 0428 10/30/20 0444 10/31/20 0329 11/01/20 1145  WBC 8.3 9.6 8.5 7.3    Liver Function Tests: Recent Labs  Lab 10/29/20 0428 10/30/20 0444 10/31/20 0329 11/01/20 0737 11/02/20 0440  ALBUMIN 2.5* 2.6* 2.3* 2.5* 2.6*   No results for input(s): LIPASE, AMYLASE in the last 168 hours. No results for input(s): AMMONIA in the last 168 hours.  ABG    Component Value Date/Time   PHART 7.242 (L) 10/20/2020 2355   PCO2ART 79.3 (HH) 10/20/2020 2355   PO2ART 80.0 (L) 10/20/2020 2355   HCO3 27.6 10/20/2020 2355   O2SAT 93.6 10/20/2020 2355     Coagulation Profile: No results for input(s): INR, PROTIME in the last 168 hours.  Cardiac Enzymes: No results for input(s): CKTOTAL, CKMB, CKMBINDEX, TROPONINI in the last 168 hours.  HbA1C: No results found for: HGBA1C  CBG: Recent Labs  Lab 11/01/20 2015 11/01/20 2358 11/02/20 0439 11/02/20 0716 11/02/20 1058  GLUCAP 102* 111* 116* 118* 102*      Christinia Gully, MD Pulmonary and [place] [phone]   After 7:00 pm call Elink  [phone]

## 2020-11-02 NOTE — Progress Notes (Signed)
PROGRESS NOTE  Jack Cox IRW:[id number] DOB: 11-May-1966 DOA: 10/20/2020 PCP: [name], MD   Brief History: 54 y.o.malewith medical history ofdiastolic CHF, atrial flutter, obstructive sleep apnea, hypertension, right bundle branch block presenting with shortness of breath for the last 2 to 3 days. Unfortunately, the patient is encephalopathic at the time of my evaluation; therefore, history is limited. Apparently, the patient went to see his nephrologist earlier on 10/20/2020. He was noted to have oxygen saturation in the 60s. As result, he was sent to the emergency department for further evaluation. Initially, the patient was awake and conversant with oxygen saturation in the low 70s. He was placed on a nonrebreather after which the patient became obtunded. VBG showed pH 7.1 66/97/106 on 80%. Subsequently, the patient was placed on the ventilator with PRVC,PEEP of 5, FiO2 70%. After approximately 10 to 15 minutes on the ventilator, the patient remained somnolent, but awakened and was able to tell me his name. He did state that he has not been taking his torsemide as directed. In addition, the patient has been sleeping in a recliner for an unclear duration of time secondary to shortness of breath. At baseline, the patient has a size 4 noncuffed tracheostomy. He is on supplemental oxygen 2.5 L during the day and 4 L at nighttime.  There is no reports of nausea, vomiting, diarrhea, chest pain, abdominal pain.  In the emergency department, the patient was afebrile and hemodynamically stable with oxygen saturation 93-94% on the ventilator with FiO2 70%. Chest x-ray showed left-sided pleural effusion with increased interstitial markings. BNP 790. PCT 0.12. BMP showed a serum creatinine 2.43. LFTs were unremarkable. WBC 8.1, hemoglobin 14.5, platelets 253,000. The patient was given ceftriaxone and azithromycin. He was also given furosemide 80 mg IV x1. COVID-19  RT-PCR negative.  He was continued on IV lasix 80 mg bid with uptrending of his serum creatinine.  Renal was consulted to assist.   Assessment/Plan: Acute on chronic respiratory failure with hypoxia and hypercarbia -Secondary to CHF in the setting of OSA -remainson ventilator with trach, pulmonary consulted to help with vent management -10/26/20 personally reviewed CXR--increased interstitial markings--?retrocardiac opacity -Pulmonary consultappreciated -PCT 0.12>>0.61 -having mucus plugging--needs aggressive suctioning -12/17 and 12/26 CXR--white out of left lung likely mucus plugging -continue mucomyst and albuterol -10/26/20-bronchoscopy--Thick inspissated mucus in proximal airways & BL lower lobes, removed with suctioning - hypertonic saline nebulizer treatments started by pulmonology 12/22 Vent Mode: PSV;CPAP FiO2 (%):  [40 %] 40 % Set Rate:  [20 bmp] 20 bmp PEEP:  [5 cmH20] 5 cmH20 Pressure Support:  [10 cmH20] 10 cmH20 Plateau Pressure:  [17 cmH20] 17 cmH20  Acute on chronic diastolic CHF/Cor Pulmonale -06/25/2019 echo EF 55%, trivial TR -12/16/21echo--EF 60-65%, no WMA, RV pressure overload, mild TR -he has been diuresing well with no diuretic support -Following BMP -Accurate I's and O's--NEG 13L -Daily weights I/O last 3 completed shifts: In: -  Out: 5400 [Urine:5400] Total I/O In: 2017.7 [I.V.:2017.7] Out: -   Atrial flutter - controlled -apixaban temporarily on hold, he is on an IV heparin infusion for full anticoagulation -rate controlledbut had an episode 3-4 hours 12/18-12/19 evening -resumed low dose metoprolol 12.5 mg BID per tube   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  -Monitor with diuresis -consult nephrology--appreciated-->albumin+lasix bid per renal  FEN -had mild hypoglycemia -NG placed 12/17 -switchedto Vital High Protein  and  increase rate to 40 cc/hr-->hypoglycemia improved  Hypernatremia - resolved now after adding more free water to feeding tube regimen.    Fever - improving with pneumonia management -101.0 fever evening 12/18 and 100.7 12/21 -blood cultures x 2--neg to date -UA and urine culture--no significant pyuria -12/16 trach aspirate-->flora -10/26/20-->BAL sent for culture -completed course of cefepime  Essential hypertension -Anticipate improvement with diuresis -metoprolol low dose with holding parameters -BPs have been controlled  OSA -The patient has a tracheostomy placed in 2001 -He has a size 4 cuffless trach  Right bundle branch block -This has been chronic with review of the medical records  Morbid obesity -BMI 52.94 -lifestyle modification  GOC -palliative following -currently full scope of care  Status is: Inpatient  Remains inpatient appropriate because:IV treatments appropriate due to intensity of illness or inability to take PO  Dispo: The patient is from:Home Anticipated d/c is KW:IOXB Anticipated d/c date is: >3 days Patient currently is not medically stable to d/c.   Family Communication:brotherupdated bedside 12/20-21  Consultants:pulm, renal  Code Status: FULL  DVT Prophylaxis: IV heparin infusion for full anticoagulation   Procedures: As Listed in Progress Note Above  Antibiotics: Cefepime 12/21>>12/27  Subjective: Patient awake, remains on vent, c/o dry mouth  Objective: Vitals:   11/02/20 0600 11/02/20 0715 11/02/20 0737 11/02/20 0752  BP: 115/67  131/75   Pulse: 70 62 63   Resp: (!) 25 (!) 26 (!) 23   Temp:  98.1 F (36.7 C)    TempSrc:  Oral    SpO2: 96% 91% 96% 96%  Weight:      Height:        Intake/Output Summary (Last 24 hours) at 11/02/2020 0921 Last data filed at 11/02/2020 3532 Gross per 24 hour  Intake 2017.74 ml  Output 2675 ml  Net -657.26 ml   Weight  change: -12.9 kg Exam:  General - pt is on vent. He is awake and alert, no apparent distress.   Neck - trach collar in place.  Lungs: BBS with occasional rales heard on left side. No increased WOB.  CV - normal s1,s2 sounds. No MRG.  Abd - obese, soft, no masses palpated.   Ext - trace pretibial edema BLEs.  Neuro - nonfocal exam, pt is sedated.   Data Reviewed: I have personally reviewed following labs and imaging studies  Basic Metabolic Panel: Recent Labs  Lab 10/29/20 0428 10/30/20 0444 10/31/20 0329 11/01/20 0737 11/02/20 0440  NA 153* 154* 145 136 139  K 4.5 4.5 4.1 3.9 4.3  CL 107 110 102 98 101  CO2 34* 33* _0 GLUCOSE 138* 123* 155* 136* 118*  BUN 108* 102* 106* 102* 104*  CREATININE 4.83* 4.74* 4.21* 3.43* 3.16*  CALCIUM 9.1 9.4 9.2 9.2 9.4  PHOS 4.0 4.0 4.2 4.6 5.1*   Liver Function Tests: Recent Labs  Lab 10/29/20 0428 10/30/20 0444 10/31/20 0329 11/01/20 0737 11/02/20 0440  ALBUMIN 2.5* 2.6* 2.3* 2.5* 2.6*   No results for input(s): LIPASE, AMYLASE in the last 168 hours. No results for input(s): AMMONIA in the last 168 hours. Coagulation Profile: No results for input(s): INR, PROTIME in the last 168 hours. CBC: Recent Labs  Lab 10/28/20 0444 10/29/20 0428 10/30/20 0444 10/31/20 0329 11/01/20 1145  WBC 8.6 8.3 9.6 8.5 7.3  HGB 12.9* 12.9* 12.7* 11.5* 11.9*  HCT 44.9 44.3 45.0 41.4 40.5  MCV 102.0* 101.4* 103.2* 104.8* 101.0*  PLT 170 179 189 169 172  Cardiac Enzymes: No results for input(s): CKTOTAL, CKMB, CKMBINDEX, TROPONINI in the last 168 hours. BNP: Invalid input(s): POCBNP CBG: Recent Labs  Lab 11/01/20 1656 11/01/20 2015 11/01/20 2358 11/02/20 0439 11/02/20 0716  GLUCAP 98 102* 111* 116* 118*   HbA1C: No results for input(s): HGBA1C in the last 72 hours. Urine analysis:    Component Value Date/Time   COLORURINE YELLOW 10/28/2020 0429   APPEARANCEUR HAZY (A) 10/28/2020 0429   LABSPEC 1.014 10/28/2020 0429    PHURINE 5.0 10/28/2020 0429   GLUCOSEU NEGATIVE 10/28/2020 0429   HGBUR SMALL (A) 10/28/2020 0429   BILIRUBINUR NEGATIVE 10/28/2020 0429   KETONESUR NEGATIVE 10/28/2020 0429   PROTEINUR 100 (A) 10/28/2020 0429   NITRITE NEGATIVE 10/28/2020 0429   LEUKOCYTESUR NEGATIVE 10/28/2020 0429    Recent Results (from the past 240 hour(s))  Culture, Urine     Status: None   Collection Time: 10/24/20 12:39 PM   Specimen: Urine, Catheterized  Result Value Ref Range Status   Specimen Description   Final    URINE, CATHETERIZED Performed at [facility], [address] [place], [place] [address]    Special Requests   Final    NONE Performed at [facility], [address], [place], [place] [address]    Culture   Final    NO GROWTH Performed at [facility], [place] [address], [place], [place] [address]    Report Status 10/25/2020 FINAL  Final  Culture, blood (Routine X 2) w Reflex to ID Panel     Status: None   Collection Time: 10/24/20  1:34 PM   Specimen: BLOOD RIGHT HAND  Result Value Ref Range Status   Specimen Description   Final    BLOOD RIGHT HAND BOTTLES DRAWN AEROBIC AND ANAEROBIC   Special Requests Blood Culture adequate volume  Final   Culture   Final    NO GROWTH 5 DAYS Performed at [facility], [address] [place], [place] [address]    Report Status 10/29/2020 FINAL  Final  Culture, blood (Routine X 2) w Reflex to ID Panel     Status: None   Collection Time: 10/24/20  1:44 PM   Specimen: BLOOD LEFT WRIST  Result Value Ref Range Status   Specimen Description   Final    BLOOD LEFT WRIST BOTTLES DRAWN AEROBIC AND ANAEROBIC   Special Requests Blood Culture adequate volume  Final   Culture   Final    NO GROWTH 5 DAYS Performed at [facility], [address], [place], [place] [address]    Report Status 10/29/2020 FINAL  Final  Culture, respiratory (non-expectorated)     Status: None   Collection Time: 10/26/20  6:07 PM   Specimen: Tracheal Aspirate; Respiratory   Result Value Ref Range Status   Specimen Description   Final    TRACHEAL ASPIRATE Performed at [facility], [address] [place], [place] [address]    Special Requests   Final    Normal Performed at [facility], [address], [place], [place] [address]    Gram Stain   Final    FEW WBC PRESENT, PREDOMINANTLY PMN MODERATE GRAM POSITIVE RODS FEW GRAM POSITIVE COCCI FEW GRAM NEGATIVE RODS    Culture   Final    FEW Normal respiratory flora-no Staph aureus or Pseudomonas seen Performed at [facility], [place] [address] [place], [place] [address]    Report Status 10/29/2020 FINAL  Final     Scheduled Meds: . albuterol  2.5 mg Nebulization Q6H  . chlorhexidine gluconate (MEDLINE KIT)  15 mL Mouth Rinse BID  . Chlorhexidine Gluconate Cloth  6 each Topical Daily  . feeding supplement (PROSource TF)  90 mL Per Tube TID  . free water  400 mL Per Tube Q4H  . mouth rinse  15 mL Mouth Rinse 2 times per day  . metoprolol tartrate  12.5 mg Per Tube BID  . pantoprazole sodium  40 mg Per Tube Q1200  . sodium chloride flush  3 mL Intravenous Q12H   Continuous Infusions: . sodium chloride    . sodium chloride    . amiodarone    . dexmedetomidine (PRECEDEX) IV infusion 1 mcg/kg/hr (11/02/20 0752)  . feeding supplement (VITAL HIGH PROTEIN) 1,000 mL (11/01/20 1344)  . heparin 2,300 Units/hr (11/02/20 0849)   Procedures/Studies: US RENAL  Result Date: 10/28/2020 CLINICAL DATA:  Chronic kidney disease.  Acute kidney injury. EXAM: RENAL / URINARY TRACT ULTRASOUND COMPLETE COMPARISON:  CT 06/12/2018 FINDINGS: Right Kidney: Renal measurements: 11.0 x 6.4 x 5.2 cm = volume: 190 mL. Increased echogenicity. No focal lesion or hydronephrosis. Left Kidney: Renal measurements: 11.2 x 7.6 x 6.2 cm = volume: 276 mL. Increased echogenicity. No focal lesion or hydronephrosis. Bladder: Foley catheter within the bladder. Other: None. IMPRESSION: Slightly enlarged kidneys showing increased  echogenicity. Findings could be due to chronic diabetic glomerulonephropathy or acute nephritis. No obstruction or focal lesion. Electronically Signed   By: Nelson Chimes M.D.   On: 10/28/2020 14:30   DG CHEST PORT 1 VIEW  Result Date: 11/01/2020 CLINICAL DATA:  Acute on chronic respiratory failure. EXAM: PORTABLE CHEST 1 VIEW COMPARISON:  10/31/2020 FINDINGS: The tracheostomy tube is stable. The NG tube is stable. Much improved left lung aeration when compared to the prior study. The left upper lobe is now aerated. Persistent airspace process in the right lung and at the left lung base. No pleural effusions or pneumothorax. IMPRESSION: 1. Much improved left lung aeration when compared to the prior study. 2. Persistent right lung and left basilar airspace process. Electronically Signed   By: Marijo Sanes M.D.   On: 11/01/2020 05:55   DG CHEST PORT 1 VIEW  Result Date: 10/31/2020 CLINICAL DATA:  Acute on chronic respiratory EXAM: PORTABLE CHEST 1 VIEW COMPARISON:  Failure four days ago FINDINGS: Enteric tube which reaches the stomach. Tracheostomy tube in place. Interval white out of the left chest with volume loss. Progressive hazy opacification of the right lower chest. Heart size is largely obscured. No visible air leak. IMPRESSION: 1. Interval white out of the left chest, likely plugging and pulmonary collapse. 2. Progressive airspace disease or pleural effusion on the right. Electronically Signed   By: Monte Fantasia M.D.   On: 10/31/2020 04:22   DG Chest Port 1 View  Result Date: 10/27/2020 CLINICAL DATA:  Acute respiratory failure EXAM: PORTABLE CHEST 1 VIEW COMPARISON:  Yesterday FINDINGS: Enteric and tracheostomy tubes remain in good position. Cardiomegaly. Interstitial opacity which could be bronchitic or congestive. There is improved aeration at the right base. Negative for pneumothorax. IMPRESSION: 1. Improved aeration. 2. Bronchitic or congestive interstitial opacity. Electronically  Signed   By: Monte Fantasia M.D.   On: 10/27/2020 07:46   DG Chest Port 1 View  Result Date: 10/26/2020 CLINICAL DATA:  Shortness of breath for 2-3 days EXAM: PORTABLE CHEST 1 VIEW COMPARISON:  Three days ago FINDINGS: Progressive hazy appearance of the right mid to lower chest. Dense retrocardiac opacity persists. An enteric tube  and tracheostomy tube remain in place. Cardiomegaly and vascular pedicle widening. IMPRESSION: Pleural fluid/pulmonary opacification at the bases which has progressed from 3 days ago. Electronically Signed   By: Monte Fantasia M.D.   On: 10/26/2020 06:02   DG CHEST PORT 1 VIEW  Result Date: 10/23/2020 CLINICAL DATA:  Shortness of breath. EXAM: PORTABLE CHEST 1 VIEW COMPARISON:  October 22, 2020 FINDINGS: The near complete opacification of the left hemithorax seen on the comparison chest x-ray has resolved. Persistent bibasilar opacities are likely atelectasis. No pneumothorax. A tracheostomy tube is in good position. An NG tube terminates in the stomach. No other acute abnormalities. IMPRESSION: 1. Support apparatus as above. 2. Bibasilar atelectasis and a possible small left effusion. Improved aeration of the left lung. Electronically Signed   By: Dorise Bullion III M.D   On: 10/23/2020 14:57   DG Chest Port 1 View  Result Date: 10/22/2020 CLINICAL DATA:  Acute respiratory failure. EXAM: PORTABLE CHEST 1 VIEW COMPARISON:  10/20/2020 FINDINGS: Tracheostomy tube overlies the airway. The cardiac silhouette is enlarged. There is persistent mild central pulmonary vascular congestion without overt edema. Airspace opacities in the left greater than right mid and lower lungs have slightly improved. Small pleural effusions are questioned. No pneumothorax is identified. IMPRESSION: Slight improvement of bilateral airspace opacities which could reflect pneumonia or atelectasis. Electronically Signed   By: [name] M.D.   On: 10/22/2020 09:16   DG Chest Port 1 View  Result  Date: 10/20/2020 CLINICAL DATA:  Questionable sepsis EXAM: PORTABLE CHEST 1 VIEW COMPARISON:  Chest x-ray 06/12/2018 report without images. FINDINGS: Tracheostomy tube terminates approximately 7.5 cm above the carina. The heart size and mediastinal contours are not well visualized. Bilateral hilar vasculature prominence. Aortic arch calcifications. Silhouetting off of the left hemidiaphragm and left cardiac border. Partial silhouetting off the right cardiac border and right hemidiaphragm. Increased interstitial markings. Likely at least trace to small volume left pleural effusion. No pneumothorax. No acute osseous abnormality. IMPRESSION: 1. Left lung opacity and likely at least trace to small volume left pleural effusion with silhouetting off of the left diaphragm and heart border. Concern for infection/inflammation. 2. Right lower lobe and middle lobe airspace opacity could represent a combination of atelectasis, infection, inflammation. 3. At least mild pulmonary edema. 4. Consider chest x-ray PA and lateral view for further evaluation. Electronically Signed   By: Iven Finn M.D.   On: 10/20/2020 12:26   DG Chest Port 1V same Day  Result Date: 10/22/2020 CLINICAL DATA:  Status post NG tube placement. EXAM: PORTABLE CHEST 1 VIEW COMPARISON:  Single-view of the chest earlier today. FINDINGS: The upper chest is off the margin of the film. NG tube is looped in the upper trachea or esophagus. Visualized left chest is completely whited out on this study. IMPRESSION: NG tube is looped in the upper esophagus or trachea. Complete whiteout of the left chest is new since the study earlier today could be due to mucous plugging and left lung collapse or rapidly developing pleural effusion and airspace disease. Electronically Signed   By: [name] M.D.   On: 10/22/2020 15:16   DG Abd Portable 1V  Result Date: 10/22/2020 CLINICAL DATA:  Nasogastric tube placement EXAM: PORTABLE ABDOMEN - 1 VIEW  COMPARISON:  Portable exam 1613 hours compared to 1523 hours FINDINGS: Tip of nasogastric tube projects over distal gastric antrum, consider withdrawal 5 cm. Bowel gas pattern normal. Opacification of the inferior LEFT hemithorax again seen. IMPRESSION: Tip of nasogastric tube  projects over distal gastric antrum. Consider withdrawal of nasogastric tube 5 cm. Electronically Signed   By: [name] M.D.   On: 10/22/2020 16:36   DG Abd Portable 1V  Result Date: 10/22/2020 CLINICAL DATA:  Nasogastric tube placement EXAM: PORTABLE ABDOMEN - 1 VIEW COMPARISON:  None. FINDINGS: Nasogastric tube extends into the gastric cardia where it loops upon itself with the tip at the junction of the mid and distal thirds of the esophagus. There is no bowel dilatation or air-fluid level to suggest bowel obstruction. No free air. There is consolidation left lower lobe. IMPRESSION: Nasogastric tube extends into the stomach where it loops upon itself with the tip at the junction of the mid and distal thirds of the esophagus. No bowel obstruction or free air evident. Consolidation left lower lobe. These results will be called to the ordering clinician or representative by the Radiologist Assistant, and communication documented in the PACS or [employer]. Electronically Signed   By: Lowella Grip III M.D.   On: 10/22/2020 15:34   ECHOCARDIOGRAM COMPLETE  Result Date: 10/21/2020    ECHOCARDIOGRAM REPORT   Patient Name:   DAILAN PFALZGRAF Date of Exam: 10/21/2020 Medical Rec #:  [id number]        Height:       68.0 in Accession #:    [id number]       Weight:       325.8 lb Date of Birth:  03-11-1966        BSA:          2.515 m Patient Age:    81 years         BP:           112/61 mmHg Patient Gender: M                HR:           51 bpm. Exam Location:  [facility] Procedure: 2D Echo Indications:    CHF-Acute Diastolic G62.69  History:        Patient has no prior history of Echocardiogram examinations.                 CHF;  Risk Factors:Non-Smoker. Acute on chronic respiratory                 failure with hypoxia.  Sonographer:    Leavy Cella RDCS (AE) Referring Phys: [phone] DAVID TAT IMPRESSIONS  1. Left ventricular ejection fraction, by estimation, is 60 to 65%. The left ventricle has normal function. The left ventricle has no regional wall motion abnormalities. There is severe left ventricular hypertrophy. Left ventricular diastolic parameters  are indeterminate. There is the interventricular septum is flattened in systole and diastole, consistent with right ventricular pressure and volume overload.  2. Right ventricular systolic function is mildly reduced. The right ventricular size is mildly enlarged. Tricuspid regurgitation signal is inadequate for assessing PA pressure.  3. Right atrial size was upper normal.  4. The pericardial effusion is posterior to the left ventricle.  5. The mitral valve is grossly normal. Trivial mitral valve regurgitation.  6. The aortic valve is tricuspid. Aortic valve regurgitation is not visualized.  7. The inferior vena cava is normal in size with greater than 50% respiratory variability, suggesting right atrial pressure of 3 mmHg. FINDINGS  Left Ventricle: Left ventricular ejection fraction, by estimation, is 60 to 65%. The left ventricle has normal function. The left ventricle has no regional wall motion abnormalities. The left ventricular  internal cavity size was normal in size. There is  severe left ventricular hypertrophy. The interventricular septum is flattened in systole and diastole, consistent with right ventricular pressure and volume overload. Left ventricular diastolic parameters are indeterminate. Right Ventricle: The right ventricular size is mildly enlarged. No increase in right ventricular wall thickness. Right ventricular systolic function is mildly reduced. Tricuspid regurgitation signal is inadequate for assessing PA pressure. Left Atrium: Left atrial size was normal in size.  Right Atrium: Right atrial size was upper normal. Pericardium: Trivial pericardial effusion is present. The pericardial effusion is posterior to the left ventricle. Mitral Valve: The mitral valve is grossly normal. Trivial mitral valve regurgitation. Tricuspid Valve: The tricuspid valve is grossly normal. Tricuspid valve regurgitation is mild. Aortic Valve: The aortic valve is tricuspid. There is moderate aortic valve annular calcification. Aortic valve regurgitation is not visualized. Pulmonic Valve: The pulmonic valve was grossly normal. Pulmonic valve regurgitation is trivial. Aorta: The aortic root is normal in size and structure. Venous: The inferior vena cava is normal in size with greater than 50% respiratory variability, suggesting right atrial pressure of 3 mmHg. IAS/Shunts: No atrial level shunt detected by color flow Doppler.  LEFT VENTRICLE PLAX 2D LVIDd:         4.66 cm  Diastology LVIDs:         2.03 cm  LV e' medial:    5.87 cm/s LV PW:         1.76 cm  LV E/e' medial:  14.0 LV IVS:        1.73 cm  LV e' lateral:   8.49 cm/s LVOT diam:     1.90 cm  LV E/e' lateral: 9.7 LVOT Area:     2.84 cm  RIGHT VENTRICLE RV S prime:     19.40 cm/s TAPSE (M-mode): 1.9 cm LEFT ATRIUM             Index       RIGHT ATRIUM           Index LA diam:        3.50 cm 1.39 cm/m  RA Area:     24.60 cm LA Vol (A2C):   43.9 ml 17.46 ml/m RA Volume:   82.30 ml  32.73 ml/m LA Vol (A4C):   43.8 ml 17.42 ml/m LA Biplane Vol: 44.4 ml 17.66 ml/m   AORTA Ao Root diam: 3.10 cm MITRAL VALVE MV Area (PHT): 2.64 cm    SHUNTS MV Decel Time: 287 msec    Systemic Diam: 1.90 cm MV E velocity: 82.30 cm/s MV A velocity: 59.10 cm/s MV E/A ratio:  1.39 Rozann Lesches MD Electronically signed by Rozann Lesches MD Signature Date/Time: 10/21/2020/4:33:43 PM    Final    Critical Care Procedure Note Authorized and Performed by: Murvin Natal MD  Total Critical Care time: 31 mins  Due to a high probability of clinically significant, life  threatening deterioration, the patient required my highest level of preparedness to intervene emergently and I personally spent this critical care time directly and personally managing the patient.  This critical care time included obtaining a history; examining the patient, pulse oximetry; ordering and review of studies; arranging urgent treatment with development of a management plan; evaluation of patient's response of treatment; frequent reassessment; and discussions with other providers.  This critical care time was performed to assess and manage the high probability of imminent and life threatening deterioration that could result in multi-organ failure.  It was exclusive of separately billable  procedures and treating other patients and teaching time.   Irwin Brakeman, MD  How to contact the [facility] Attending or Consulting provider [place] or covering provider during after hours [place], for this patient?  1. Check the care team in [facility] and look for a) attending/consulting TRH provider listed and b) the [facility] team listed 2. Log into [email or web address] and use [place]'s universal password to access. If you do not have the password, please contact the hospital operator. 3. Locate the [facility] provider you are looking for under [facility] and page to a number that you can be directly reached. 4. If you still have difficulty reaching the provider, please page the [facility] (Director on Call) for the Hospitalists listed on amion for assistance.  11/02/2020, 9:21 AM   LOS: 13 days

## 2020-11-02 NOTE — Progress Notes (Signed)
ANTICOAGULATION CONSULT NOTE  Pharmacy Consult:  Heparin Indication: atrial fibrillation  No Known Allergies  Patient Measurements: Height: 5\' 7"  (170.2 cm) Weight: 130.5 kg (287 lb 11.2 oz) IBW/kg (Calculated) : 66.1 HEPARIN DW (KG): 103.8  Vital Signs: Temp: 98.1 F (36.7 C) (12/28 0715) Temp Source: Oral (12/28 0715) BP: 131/75 (12/28 0737) Pulse Rate: 63 (12/28 0737)  Labs: Recent Labs    10/31/20 0329 11/01/20 0737 11/01/20 1145 11/02/20 0440  HGB 11.5*  --  11.9*  --   HCT 41.4  --  40.5  --   PLT 169  --  172  --   HEPARINUNFRC 0.33 0.31  --  0.24*  CREATININE 4.21* 3.43*  --  3.16*    Estimated Creatinine Clearance: 34.7 mL/min (A) (by C-G formula based on SCr of 3.16 mg/dL (H)).  Assessment: Pharmacy consulted to dose heparin infusion for this 54 yo male with atrial fibrillation.  Patient was  taking apixaban PTA and was on treatment-dose Lovenox from 12-17 to 12-20. Heparin is being started due to patient's unstable renal function.   HL 0.24-  subtherapeutic.   Goal of Therapy:  Heparin level 0.3-0.7 units/ml Monitor platelets by anticoagulation protocol: Yes   Plan:  Heparin bolus 1500 units and  Increase heparin 2300 units/hr Daily HL, CBC Monitor for signs and symptoms of bleeding  Isac Sarna, BS Vena [place], BCPS Clinical Pharmacist Pager [phone]  11/02/2020 8:41 AM

## 2020-11-02 NOTE — TOC Progression Note (Signed)
Transition of Care [facility]) - Progression Note    Patient Details  Name: Ewald Beg MRN: [id number] Date of Birth: 06/03/1966  Transition of Care [facility]) CM/SW Contact  Salome Arnt, [place] Phone Number: 11/02/2020, 9:48 AM  Clinical Narrative:  TOC received consult stating that pt's wife was wanting to talk to [facility] about d/c planning. [name] states they divorced last year, but they are still very involved in each other's lives. She reports pt lives with his brother and is wondering about home health. LCSW reassured [name] that TOC will follow and address d/c plans when appropriate as pt is still on vent. [name] appreciative of call.       Expected Discharge Plan: Home/Self Care Barriers to Discharge: Continued Medical Work up  Expected Discharge Plan and Services Expected Discharge Plan: Home/Self Care In-house Referral: Clinical Social Work Discharge Planning Services: NA   Living arrangements for the past 2 months: Single Family Home                 DME Arranged: N/A DME Agency: NA       HH Arranged: NA HH Agency: NA         Social Determinants of Health (SDOH) Interventions    Readmission Risk Interventions No flowsheet data found.

## 2020-11-02 NOTE — Progress Notes (Signed)
Patient ID: Jack Cox, male   DOB: Nov 27, 1965, 54 y.o.   MRN: [id number] S:   No events overnight.   PSV On vent.   SCr down to 3.2, SNa remains normal  3.6L UOP w/o diuretics  O:BP 131/75   Pulse 63   Temp 98.1 F (36.7 C) (Oral)   Resp (!) 23   Ht '5\' 7"'  (1.702 m)   Wt 130.5 kg   SpO2 96%   BMI 45.06 kg/m   Intake/Output Summary (Last 24 hours) at 11/02/2020 0856 Last data filed at 11/02/2020 3668 Gross per 24 hour  Intake 2017.74 ml  Output 3625 ml  Net -1607.26 ml   Intake/Output: I/O last 3 completed shifts: In: -  Out: 5400 [Urine:5400]  Intake/Output this shift:  Total I/O In: 2017.7 [I.V.:2017.7] Out: -  Weight change: -12.9 kg Gen: on vent via trach in NAD CVS: RRR Resp: ventilated BS bilaterally Abd: obese, +BS, soft, NT Ext: 1+ LEE, findings consistent wit hchronic venous stasis  Recent Labs  Lab 10/27/20 0428 10/28/20 0444 10/29/20 0428 10/30/20 0444 10/31/20 0329 11/01/20 0737 11/02/20 0440  NA 149* 151* 153* 154* 145 136 139  K 5.1 4.9 4.5 4.5 4.1 3.9 4.3  CL 103 104 107 110 102 98 101  CO2 36* 33* 34* 33* '31 26 26  ' GLUCOSE 129* 139* 138* 123* 155* 136* 118*  BUN 74* 95* 108* 102* 106* 102* 104*  CREATININE 3.50* 4.94* 4.83* 4.74* 4.21* 3.43* 3.16*  ALBUMIN 2.7* 2.5* 2.5* 2.6* 2.3* 2.5* 2.6*  CALCIUM 8.9 8.9 9.1 9.4 9.2 9.2 9.4  PHOS 7.1* 5.5* 4.0 4.0 4.2 4.6 5.1*   Liver Function Tests: Recent Labs  Lab 10/31/20 0329 11/01/20 0737 11/02/20 0440  ALBUMIN 2.3* 2.5* 2.6*   No results for input(s): LIPASE, AMYLASE in the last 168 hours. No results for input(s): AMMONIA in the last 168 hours. CBC: Recent Labs  Lab 10/28/20 0444 10/29/20 0428 10/30/20 0444 10/31/20 0329 11/01/20 1145  WBC 8.6 8.3 9.6 8.5 7.3  HGB 12.9* 12.9* 12.7* 11.5* 11.9*  HCT 44.9 44.3 45.0 41.4 40.5  MCV 102.0* 101.4* 103.2* 104.8* 101.0*  PLT 170 179 189 169 172   Cardiac Enzymes: No results for input(s): CKTOTAL, CKMB, CKMBINDEX, TROPONINI  in the last 168 hours. CBG: Recent Labs  Lab 11/01/20 1656 11/01/20 2015 11/01/20 2358 11/02/20 0439 11/02/20 0716  GLUCAP 98 102* 111* 116* 118*    Iron Studies: No results for input(s): IRON, TIBC, TRANSFERRIN, FERRITIN in the last 72 hours. Studies/Results: DG CHEST PORT 1 VIEW  Result Date: 11/01/2020 CLINICAL DATA:  Acute on chronic respiratory failure. EXAM: PORTABLE CHEST 1 VIEW COMPARISON:  10/31/2020 FINDINGS: The tracheostomy tube is stable. The NG tube is stable. Much improved left lung aeration when compared to the prior study. The left upper lobe is now aerated. Persistent airspace process in the right lung and at the left lung base. No pleural effusions or pneumothorax. IMPRESSION: 1. Much improved left lung aeration when compared to the prior study. 2. Persistent right lung and left basilar airspace process. Electronically Signed   By: Marijo Sanes M.D.   On: 11/01/2020 05:55   . albuterol  2.5 mg Nebulization Q6H  . chlorhexidine gluconate (MEDLINE KIT)  15 mL Mouth Rinse BID  . Chlorhexidine Gluconate Cloth  6 each Topical Daily  . feeding supplement (PROSource TF)  90 mL Per Tube TID  . free water  400 mL Per Tube Q4H  . mouth rinse  15 mL  Mouth Rinse 2 times per day  . metoprolol tartrate  12.5 mg Per Tube BID  . pantoprazole sodium  40 mg Per Tube Q1200  . sodium chloride flush  3 mL Intravenous Q12H    BMET    Component Value Date/Time   NA 139 11/02/2020 0440   K 4.3 11/02/2020 0440   CL 101 11/02/2020 0440   CO2 26 11/02/2020 0440   GLUCOSE 118 (H) 11/02/2020 0440   BUN 104 (H) 11/02/2020 0440   CREATININE 3.16 (H) 11/02/2020 0440   CALCIUM 9.4 11/02/2020 0440   GFRNONAA 22 (L) 11/02/2020 0440   CBC    Component Value Date/Time   WBC 7.3 11/01/2020 1145   RBC 4.01 (L) 11/01/2020 1145   HGB 11.9 (L) 11/01/2020 1145   HCT 40.5 11/01/2020 1145   PLT 172 11/01/2020 1145   MCV 101.0 (H) 11/01/2020 1145   MCH 29.7 11/01/2020 1145   MCHC 29.4 (L)  11/01/2020 1145   RDW 13.8 11/01/2020 1145   LYMPHSABS 0.9 10/20/2020 1217   MONOABS 0.8 10/20/2020 1217   EOSABS 0.1 10/20/2020 1217   BASOSABS 0.1 10/20/2020 1217     Assessment/Plan:  1. AKI/CKD stage IV- presumably cardiorenal syndrome Adequate UOP w/o diuretics.  Downtrendign SCr, recovering GFR. 1. BUN and Cr continue to improve off of diuretics with good UOP. 2. Continue to hold diuretics and follow.  3. Renal US without obstruction  4. Avoid nephrotoxic agents such as NSAIDs (was on meloxicam at home), Cox-II I's, IV contrast, and phosphate containing bowel preparations 5. Continue to follow UOP and daily Scr. 6. No indication for dialysis at this time.  2. Acute exacerbation of diastolic CHF/cor pulmonale- he remains net negative 15.5 liters even off of IV lasix.  3. 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 Pulm following, weening 4. HTN- stable 5. Hypernatremia- Quick normalization with parenteral free water, hold IVFs and trend.  Try again to use enteral route to titrate to stable SNa as needed 6. Atrial flutter- on eliquis 7. Fevers, - ABX per primary 8. CKD stage IIIb/IV- normally followed by Dr. 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

## 2020-11-03 LAB — CBC
HCT: 40.9 % (ref 39.0–52.0)
Hemoglobin: 12.1 g/dL — ABNORMAL LOW (ref 13.0–17.0)
MCH: 29.7 pg (ref 26.0–34.0)
MCHC: 29.6 g/dL — ABNORMAL LOW (ref 30.0–36.0)
MCV: 100.2 fL — ABNORMAL HIGH (ref 80.0–100.0)
Platelets: 177 10*3/uL (ref 150–400)
RBC: 4.08 MIL/uL — ABNORMAL LOW (ref 4.22–5.81)
RDW: 13.6 % (ref 11.5–15.5)
WBC: 6.1 10*3/uL (ref 4.0–10.5)
nRBC: 0.3 % — ABNORMAL HIGH (ref 0.0–0.2)

## 2020-11-03 LAB — GLUCOSE, CAPILLARY
Glucose-Capillary: 145 mg/dL — ABNORMAL HIGH (ref 70–99)
Glucose-Capillary: 107 mg/dL — ABNORMAL HIGH (ref 70–99)
Glucose-Capillary: 101 mg/dL — ABNORMAL HIGH (ref 70–99)
Glucose-Capillary: 104 mg/dL — ABNORMAL HIGH (ref 70–99)
Glucose-Capillary: 117 mg/dL — ABNORMAL HIGH (ref 70–99)

## 2020-11-03 LAB — HEPARIN LEVEL (UNFRACTIONATED): Heparin Unfractionated: 0.37 IU/mL (ref 0.30–0.70)

## 2020-11-03 NOTE — Progress Notes (Signed)
Patient ID: Jack Cox, male   DOB: 07-21-1966, 54 y.o.   MRN: [id number] S:   No events overnight.   Remains trach and vented  SCr down to 2.8  3.6L UOP w/o diuretics  O:BP (!) 142/72   Pulse 71   Temp 98.3 F (36.8 C) (Oral)   Resp (!) 23   Ht '5\' 7"'  (1.702 m)   Wt 130.5 kg   SpO2 100%   BMI 45.06 kg/m   Intake/Output Summary (Last 24 hours) at 11/03/2020 0930 Last data filed at 11/03/2020 0742 Gross per 24 hour  Intake 495.92 ml  Output 3650 ml  Net -3154.08 ml   Intake/Output: I/O last 3 completed shifts: In: 2503.7 [I.V.:2303.7; NG/GT:200] Out: 5550 [Urine:5550]  Intake/Output this shift:  Total I/O In: 10 [I.V.:10] Out: -  Weight change:  Gen: on vent via trach in NAD CVS: RRR Resp: ventilated BS bilaterally Abd: obese, +BS, soft, NT Ext: 1+ pitting LEE  Recent Labs  Lab 10/28/20 0444 10/29/20 0428 10/30/20 0444 10/31/20 0329 11/01/20 0737 11/02/20 0440 11/03/20 0223  NA 151* 153* 154* 145 136 139 138  K 4.9 4.5 4.5 4.1 3.9 4.3 4.0  CL 104 107 110 102 98 101 103  CO2 33* 34* 33* '31 26 26 26  ' GLUCOSE 139* 138* 123* 155* 136* 118* 126*  BUN 95* 108* 102* 106* 102* 104* 104*  CREATININE 4.94* 4.83* 4.74* 4.21* 3.43* 3.16* 2.83*  ALBUMIN 2.5* 2.5* 2.6* 2.3* 2.5* 2.6* 2.5*  CALCIUM 8.9 9.1 9.4 9.2 9.2 9.4 9.7  PHOS 5.5* 4.0 4.0 4.2 4.6 5.1* 4.8*   Liver Function Tests: Recent Labs  Lab 11/01/20 0737 11/02/20 0440 11/03/20 0223  ALBUMIN 2.5* 2.6* 2.5*   No results for input(s): LIPASE, AMYLASE in the last 168 hours. No results for input(s): AMMONIA in the last 168 hours. CBC: Recent Labs  Lab 10/29/20 0428 10/30/20 0444 10/31/20 0329 11/01/20 1145 11/03/20 0223  WBC 8.3 9.6 8.5 7.3 6.1  HGB 12.9* 12.7* 11.5* 11.9* 12.1*  HCT 44.3 45.0 41.4 40.5 40.9  MCV 101.4* 103.2* 104.8* 101.0* 100.2*  PLT 179 189 169 172 177   Cardiac Enzymes: No results for input(s): CKTOTAL, CKMB, CKMBINDEX, TROPONINI in the last 168  hours. CBG: Recent Labs  Lab 11/02/20 1636 11/02/20 2132 11/02/20 2358 11/03/20 0553 11/03/20 0832  GLUCAP 120* 114* 98 145* 107*    Iron Studies: No results for input(s): IRON, TIBC, TRANSFERRIN, FERRITIN in the last 72 hours. Studies/Results: DG CHEST PORT 1 VIEW  Result Date: 11/02/2020 CLINICAL DATA:  NG tube placement. EXAM: PORTABLE CHEST 1 VIEW COMPARISON:  Radiograph yesterday. FINDINGS: The tip of the enteric tube is below the diaphragm, not included in the field of view. The side-port is not well visualized but likely below the diaphragm. Stable cardiomegaly. Patchy airspace opacities in both lung bases, unchanged. IMPRESSION: 1. Tip of the enteric tube below the diaphragm, not included in the field of view. 2. Unchanged patchy bibasilar airspace opacities. Electronically Signed   By: Keith Rake M.D.   On: 11/02/2020 21:51   . albuterol  2.5 mg Nebulization Q6H  . chlorhexidine gluconate (MEDLINE KIT)  15 mL Mouth Rinse BID  . Chlorhexidine Gluconate Cloth  6 each Topical Daily  . feeding supplement (PROSource TF)  90 mL Per Tube TID  . free water  400 mL Per Tube Q4H  . mouth rinse  15 mL Mouth Rinse 2 times per day  . metoprolol tartrate  12.5 mg Per Tube  BID  . pantoprazole sodium  40 mg Per Tube Q1200  . sodium chloride flush  3 mL Intravenous Q12H    BMET    Component Value Date/Time   NA 138 11/03/2020 0223   K 4.0 11/03/2020 0223   CL 103 11/03/2020 0223   CO2 26 11/03/2020 0223   GLUCOSE 126 (H) 11/03/2020 0223   BUN 104 (H) 11/03/2020 0223   CREATININE 2.83 (H) 11/03/2020 0223   CALCIUM 9.7 11/03/2020 0223   GFRNONAA 26 (L) 11/03/2020 0223   CBC    Component Value Date/Time   WBC 6.1 11/03/2020 0223   RBC 4.08 (L) 11/03/2020 0223   HGB 12.1 (L) 11/03/2020 0223   HCT 40.9 11/03/2020 0223   PLT 177 11/03/2020 0223   MCV 100.2 (H) 11/03/2020 0223   MCH 29.7 11/03/2020 0223   MCHC 29.6 (L) 11/03/2020 0223   RDW 13.6 11/03/2020 0223    LYMPHSABS 0.9 10/20/2020 1217   MONOABS 0.8 10/20/2020 1217   EOSABS 0.1 10/20/2020 1217   BASOSABS 0.1 10/20/2020 1217     Assessment/Plan:  1. AKI/CKD stage IV- presumably cardiorenal syndrome Adequate UOP w/o diuretics. Crt peaked at 5 Downtrendign SCr, recovering GFR. BL Crt 2.2-2.7. 1. BUN and Cr continue to improve off of diuretics with good UOP. 2. Continue to hold diuretics and follow.  3. Renal US without obstruction  4. Avoid nephrotoxic agents such as NSAIDs (was on meloxicam at home), Cox-II I's, IV contrast, and phosphate containing bowel preparations 5. Continue to follow UOP and daily Scr. 6. No indication for dialysis at this time.  7. Patient's creatinine has returned to baseline 2. Acute exacerbation of diastolic CHF/cor pulmonale- continues to urinate well without the use of lasix 3. 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 Pulm following, weening 4. HTN- stable 5. Atrial flutter- on eliquis 6. Fevers - no longer on antibiotics 7. CKD stage IIIb/IV- normally followed by Dr. 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 His relatively high UOP may be post-ATN diuresis to some degree and when this resolves he may need reinstitution of diuretics. Given his improvement to baseline CKD we will sign off at this time. If nephrology is needed in the future please let us know.     [facility]

## 2020-11-03 NOTE — Plan of Care (Signed)
  Problem: Acute Rehab PT Goals(only PT should resolve) Goal: Pt Will Go Supine/Side To Sit Outcome: Progressing Flowsheets (Taken 11/03/2020 1429) Pt will go Supine/Side to Sit:  with min guard assist  with minimal assist Goal: Patient Will Transfer Sit To/From Stand Outcome: Progressing Flowsheets (Taken 11/03/2020 1429) Patient will transfer sit to/from stand:  with moderate assist  with maximum assist Goal: Pt Will Transfer Bed To Chair/Chair To Bed Outcome: Progressing Flowsheets (Taken 11/03/2020 1429) Pt will Transfer Bed to Chair/Chair to Bed:  with mod assist  with max assist Goal: Pt Will Ambulate Outcome: Progressing Flowsheets (Taken 11/03/2020 1429) Pt will Ambulate:  10 feet  with moderate assist  with rolling walker   2:29 PM, 11/03/20 Lonell Grandchild, MPT Physical Therapist with [facility] [phone] office [phone] mobile phone

## 2020-11-03 NOTE — Evaluation (Signed)
Physical Therapy Evaluation Patient Details Name: Anden Bartolo MRN: [id number] DOB: 1966/08/21 Today's Date: 11/03/2020   History of Present Illness  Suhan Paci is a 54 y.o. male with medical history of diastolic CHF, atrial flutter, obstructive sleep apnea, hypertension, right bundle branch block presenting with shortness of breath for the last 2 to 3 days.  Unfortunately, the patient is encephalopathic at the time of my evaluation; therefore, history is limited.  Apparently, the patient went to see his nephrologist earlier on 10/20/2020.  He was noted to have oxygen saturation in the 60s.  As result, he was sent to the emergency department for further evaluation.  Initially, the patient was awake and conversant with oxygen saturation in the low 70s.  He was placed on a nonrebreather after which the patient became obtunded.  VBG showed pH 7.1 66/97/106 on 80%.  Subsequently, the patient was placed on the ventilator with PRVC, PEEP of 5, FiO2 70%.  After approximately 10 to 15 minutes on the ventilator, the patient remained somnolent, but awakened and was able to tell me his name.  He did state that he has not been taking his torsemide as directed.  In addition, the patient has been sleeping in a recliner for an unclear duration of time secondary to shortness of breath.  At baseline, the patient has a size 4 noncuffed tracheostomy.  He is on supplemental oxygen 2.5 L during the day and 4 L at nighttime.  There is no reports of nausea, vomiting, diarrhea, chest pain, abdominal pain.  In the emergency department, the patient was afebrile and hemodynamically stable with oxygen saturation 93-94% on the ventilator with FiO2 70%.  Chest x-ray showed left-sided pleural effusion with increased interstitial markings.  BNP 790.  PCT 0.12.  BMP showed a serum creatinine 2.43.  LFTs were unremarkable.  WBC 8.1, hemoglobin 14.5, platelets 253,000.  The patient was given ceftriaxone and azithromycin.  He was also  given furosemide 80 mg IV x1.  COVID-19 RT-PCR negative    Clinical Impression  Patient demonstrates slow labored movement for sitting up at bedside, frequently leans backwards once fatigued, attempted standing using RW, but unable to BLE weakness and fatigue.  Patient required Max assist for sitting to supine due to unable to lift legs against gravity and demonstrates fair/good return for using BLE to help scoot self up in bed with bed in head down position.  Patient will benefit from continued physical therapy in hospital and recommended venue below to increase strength, balance, endurance for safe ADLs and gait.     Follow Up Recommendations SNF    Equipment Recommendations  None recommended by PT    Recommendations for Other Services       Precautions / Restrictions Precautions Precautions: Fall Restrictions Weight Bearing Restrictions: No      Mobility  Bed Mobility Overal bed mobility: Needs Assistance Bed Mobility: Sit to Supine;Supine to Sit     Supine to sit: Mod assist;Max assist Sit to supine: Max assist   General bed mobility comments: slow labored movement    Transfers                    Ambulation/Gait                Stairs            Wheelchair Mobility    Modified Rankin (Stroke Patients Only)       Balance Overall balance assessment: Needs assistance Sitting-balance support: Feet supported;No upper extremity  supported Sitting balance-Leahy Scale: Poor Sitting balance - Comments: fair/poor seated at EOB Postural control: Posterior lean                                   Pertinent Vitals/Pain Pain Assessment: Faces Faces Pain Scale: Hurts a little bit Pain Location: over bottom Pain Descriptors / Indicators: Sore Pain Intervention(s): Limited activity within patient's tolerance;Monitored during session;Repositioned    Home Living Family/patient expects to be discharged to:: Private residence Living  Arrangements: Other relatives Available Help at Discharge: Family;Available 24 hours/day Type of Home: House Home Access: Ramped entrance     Home Layout: One level Home Equipment: Walker - 2 wheels;Wheelchair - manual;Cane - single point      Prior Function Level of Independence: Independent         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        Extremity/Trunk Assessment   Upper Extremity Assessment Upper Extremity Assessment: Generalized weakness    Lower Extremity Assessment Lower Extremity Assessment: Generalized weakness    Cervical / Trunk Assessment Cervical / Trunk Assessment: Normal  Communication   Communication: Tracheostomy;Other (comment) (able to answer questions with yes/no nodding)  Cognition Arousal/Alertness: Awake/alert Behavior During Therapy: WFL for tasks assessed/performed Overall Cognitive Status: Within Functional Limits for tasks assessed                                        General Comments      Exercises     Assessment/Plan    PT Assessment Patient needs continued PT services  PT Problem List Decreased strength;Decreased activity tolerance;Decreased balance;Decreased mobility       PT Treatment Interventions DME instruction;Gait training;Stair training;Functional mobility training;Therapeutic activities;Therapeutic exercise;Balance training;Patient/family education    PT Goals (Current goals can be found in the Care Plan section)  Acute Rehab PT Goals Patient Stated Goal: return home after rehab PT Goal Formulation: With patient Time For Goal Achievement: 11/17/20 Potential to Achieve Goals: Good    Frequency Min 3X/week   Barriers to discharge        Co-evaluation               AM-PAC PT "6 Clicks" Mobility  Outcome Measure Help needed turning from your back to your side while in a flat bed without using bedrails?: A Lot Help needed moving from lying  on your back to sitting on the side of a flat bed without using bedrails?: A Lot Help needed moving to and from a bed to a chair (including a wheelchair)?: Total Help needed standing up from a chair using your arms (e.g., wheelchair or bedside chair)?: Total Help needed to walk in hospital room?: Total Help needed climbing 3-5 steps with a railing? : Total 6 Click Score: 8    End of Session Equipment Utilized During Treatment: Oxygen Activity Tolerance: Patient tolerated treatment well;Patient limited by fatigue Patient left: in bed;with call bell/phone within reach Nurse Communication: Mobility status PT Visit Diagnosis: Unsteadiness on feet (R26.81);Other abnormalities of gait and mobility (R26.89);Muscle weakness (generalized) (M62.81)    Time: 6283-1517 PT Time Calculation (min) (ACUTE ONLY): 22 min   Charges:   PT Evaluation $PT Eval Moderate Complexity: 1 Mod PT Treatments $Therapeutic Activity: 8-22 mins        2:27 PM, 11/03/20  Lonell Grandchild, MPT Physical Therapist with [facility] [phone] office [phone] mobile phone

## 2020-11-03 NOTE — Progress Notes (Signed)
ANTICOAGULATION CONSULT NOTE  Pharmacy Consult:  Heparin Indication: atrial fibrillation  No Known Allergies  Patient Measurements: Height: 5\' 7"  (170.2 cm) Weight: 130.5 kg (287 lb 11.2 oz) IBW/kg (Calculated) : 66.1 HEPARIN DW (KG): 103.8  Vital Signs: Temp: 98.8 F (37.1 C) (12/29 0000) BP: 155/80 (12/29 0700) Pulse Rate: 73 (12/29 0700)  Labs: Recent Labs    11/01/20 0737 11/01/20 1145 11/02/20 0440 11/02/20 1659 11/03/20 0223  HGB  --  11.9*  --   --  12.1*  HCT  --  40.5  --   --  40.9  PLT  --  172  --   --  177  HEPARINUNFRC 0.31  --  0.24* 0.29* 0.37  CREATININE 3.43*  --  3.16*  --  2.83*    Estimated Creatinine Clearance: 38.8 mL/min (A) (by C-G formula based on SCr of 2.83 mg/dL (H)).  Assessment: Pharmacy consulted to dose heparin infusion for this 54 yo male with atrial fibrillation.  Patient was  taking apixaban PTA and was on treatment-dose Lovenox from 12-17 to 12-20. Heparin is being started due to patient's unstable renal function.   HL 0.37-  therapeutic.   Goal of Therapy:  Heparin level 0.3-0.7 units/ml Monitor platelets by anticoagulation protocol: Yes   Plan:  Continue heparin infusion at 2300 units/hr Daily HL, CBC Monitor for signs and symptoms of bleeding  Margot Ables, PharmD Clinical Pharmacist 11/03/2020 7:58 AM

## 2020-11-03 NOTE — TOC Progression Note (Signed)
Transition of Care [facility]) - Progression Note    Patient Details  Name: Jack Cox MRN: [id number] Date of Birth: 02-Apr-1966  Transition of Care [facility]) CM/SW Contact  Salome Arnt, [place] Phone Number: 11/03/2020, 4:07 PM  Clinical Narrative:   LCSW received call from [facility] with [facility]. [facility] has been following pt and can start insurance authorization for LTAC if MD/family agreeable. LCSW spoke with pt's ex-wife and MD who are in agreement. Raquel Sarna notified and will start auth- anticipate this could take several days. TOC to follow.     Expected Discharge Plan: Home/Self Care Barriers to Discharge: Continued Medical Work up  Expected Discharge Plan and Services Expected Discharge Plan: Home/Self Care In-house Referral: Clinical Social Work Discharge Planning Services: NA   Living arrangements for the past 2 months: Single Family Home                 DME Arranged: N/A DME Agency: NA       HH Arranged: NA HH Agency: NA         Social Determinants of Health (SDOH) Interventions    Readmission Risk Interventions No flowsheet data found.

## 2020-11-03 NOTE — Progress Notes (Signed)
PROGRESS NOTE  Jack Cox WEX:[id number] DOB: [birth year] DOA: 10/20/2020 PCP: [name], MD  Brief History:  54 y.o.malewith medical history ofdiastolic CHF, atrial flutter, obstructive sleep apnea, hypertension, right bundle branch block presenting with shortness of breath for the last 2 to 3 days. Unfortunately, the patient is encephalopathic at the time of my evaluation; therefore, history is limited. Apparently, the patient went to see his nephrologist earlier on 10/20/2020. He was noted to have oxygen saturation in the 60s. As result, he was sent to the emergency department for further evaluation. Initially, the patient was awake and conversant with oxygen saturation in the low 70s. He was placed on a nonrebreather after which the patient became obtunded. VBG showed pH 7.1 66/97/106 on 80%. Subsequently, the patient was placed on the ventilator with PRVC,PEEP of 5, FiO2 70%. After approximately 10 to 15 minutes on the ventilator, the patient remained somnolent, but awakened and was able to tell me his name. He did state that he has not been taking his torsemide as directed. In addition, the patient has been sleeping in a recliner for an unclear duration of time secondary to shortness of breath. At baseline, the patient has a size 4 noncuffed tracheostomy. He is on supplemental oxygen 2.5 L during the day and 4 L at nighttime.  There is no reports of nausea, vomiting, diarrhea, chest pain, abdominal pain.  In the emergency department, the patient was afebrile and hemodynamically stable with oxygen saturation 93-94% on the ventilator with FiO2 70%. Chest x-ray showed left-sided pleural effusion with increased interstitial markings. BNP 790. PCT 0.12. BMP showed a serum creatinine 2.43. LFTs were unremarkable. WBC 8.1, hemoglobin 14.5, platelets 253,000. The patient was given ceftriaxone and azithromycin. He was also given furosemide 80 mg IV x1. COVID-19 RT-PCR  negative. He was continued on IV lasix 80 mg bid with uptrending of his serum creatinine. Renal was consulted to assist.  Pulmonary was also consulted to assist also.  Assessment/Plan: Acute on chronic respiratory failure with hypoxia and hypercarbia -Secondary to CHF in the setting of OSA -remainson ventilator with trach with trach collar trials during day -pulmonary consulted to help with vent management -dyssynchronous on prvc when not sedated 12/17 > changed to PCV -11/02/20 personally reviewed CXR--increased interstitial markings--bilateral patchy opacities -PCT 0.12>>0.61 -having mucus plugging--needs aggressive suctioning -12/17 and 12/26 CXR--white out of left lung likely mucus plugging -continue mucomyst and albuterol -10/26/20-bronchoscopy--Thick inspissated mucus in proximal airways & BL lower lobes, removed with suctioning - hypertonic saline nebulizer treatments started by pulmonology 12/22>>12/27 -mucomyst d/ced 12/27   Acute on chronic diastolic CHF/Cor Pulmonale -06/25/2019 echo EF 55%, trivial TR -12/16/21echo--EF 60-65%, no WMA, RV pressure overload, mild TR -he has been diuresing well with no diuretic support -Following BMP -Accurate I's and O's--NEG 16.6L -Daily weights  Atrial flutter - controlled -apixaban temporarily on hold, he is on an IV heparin infusion for full anticoagulation -rate controlledbut had an episode 3-4 hours 12/18-12/19 evening -resumed low dose metoprolol 12.5 mg BID per tube   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  -Monitor with diuresis -consult nephrology--appreciated-->albumin+lasix bid per renal -last dose of lasix was 10/25/20 -last dose albumin 12/26  FEN -had mild hypoglycemia -NG placed 12/17 -switchedto Vital High Protein _0  cc/hr-->hypoglycemia improved  Hypernatremia - resolved now after adding more free  water to feeding tube regimen.  Fever - improving with pneumonia management -101.0 fever evening 12/18 and 100.7 12/21 -blood cultures x 2--neg to date -UA and urine culture--no significant pyuria -12/16 trach aspirate-->flora -10/26/20-->BAL sent for culture -completed course of cefepime 12/27  Essential hypertension -overall stable -metoprolol low dose with holding parameters -BPs have been controlled  OSA -The patient has a tracheostomy placed in 2001 -He has a size 4 cuffless trach  Right bundle branch block -This has been chronic with review of the medical records  Morbid obesity -BMI 52.94 -lifestyle modification  GOC -palliative following -currently full scope of care  Status is: Inpatient  Remains inpatient appropriate because:IV treatments appropriate due to intensity of illness or inability to take PO  Dispo: The patient is from:Home Anticipated d/c is YP:PJKD Anticipated d/c date is: >3 days Patient currently is not medically stable to d/c.   Family Communication:brotherupdated bedside 12/20-21  Consultants:pulm, renal  Code Status: FULL  DVT Prophylaxis: IV heparin infusion for full anticoagulation   Procedures: As Listed in Progress Note Above  Antibiotics: Cefepime 12/21>>12/27           Subjective: Patient denies fevers, chills, headache, chest pain, dyspnea, nausea, vomiting, diarrhea, abdominal pa in,    Objective: Vitals:   11/03/20 0800 11/03/20 0832 11/03/20 0938 11/03/20 1134  BP: (!) 142/72     Pulse: 71     Resp: (!) 23     Temp:  98.3 F (36.8 C)  98.6 F (37 C)  TempSrc:  Oral  Oral  SpO2: 100%  98%   Weight:      Height:        Intake/Output Summary (Last 24 hours) at 11/03/2020 1302 Last data filed at 11/03/2020 1100 Gross per 24 hour  Intake 10 ml  Output 4100 ml  Net -4090 ml   Weight change:  Exam:   General:  Pt is alert,  follows commands appropriately, not in acute distress  HEENT: No icterus, No thrush, No neck mass, [place]/AT  Cardiovascular: RRR, S1/S2, no rubs, no gallops  Respiratory: bilateral rhonchi  Abdomen: Soft/+BS, non tender, non distended, no guarding  Extremities: 1+LE edema, No lymphangitis, No petechiae, No rashes, no synovitis   Data Reviewed: I have personally reviewed following labs and imaging studies Basic Metabolic Panel: Recent Labs  Lab 10/30/20 0444 10/31/20 0329 11/01/20 0737 11/02/20 0440 11/03/20 0223  NA 154* 145 136 139 138  K 4.5 4.1 3.9 4.3 4.0  CL 110 102 98 101 103  CO2 33* _0 GLUCOSE 123* 155* 136* 118* 126*  BUN 102* 106* 102* 104* 104*  CREATININE 4.74* 4.21* 3.43* 3.16* 2.83*  CALCIUM 9.4 9.2 9.2 9.4 9.7  PHOS 4.0 4.2 4.6 5.1* 4.8*   Liver Function Tests: Recent Labs  Lab 10/30/20 0444 10/31/20 0329 11/01/20 0737 11/02/20 0440 11/03/20 0223  ALBUMIN 2.6* 2.3* 2.5* 2.6* 2.5*   No results for input(s): LIPASE, AMYLASE in the last 168 hours. No results for input(s): AMMONIA in the last 168 hours. Coagulation Profile: No results for input(s): INR, PROTIME in the last 168 hours. CBC: Recent Labs  Lab 10/29/20 0428 10/30/20 0444 10/31/20 0329 11/01/20 1145 11/03/20 0223  WBC 8.3 9.6 8.5 7.3 6.1  HGB 12.9* 12.7* 11.5* 11.9* 12.1*  HCT 44.3 45.0 41.4 40.5 40.9  MCV 101.4* 103.2* 104.8* 101.0* 100.2*  PLT 179 189 169 172 177   Cardiac Enzymes: No results for input(s): CKTOTAL, CKMB, CKMBINDEX, TROPONINI in the last 168 hours. BNP: Invalid input(s): POCBNP CBG:  Recent Labs  Lab 11/02/20 2132 11/02/20 2358 11/03/20 0553 11/03/20 0832 11/03/20 1136  GLUCAP 114* 98 145* 107* 101*   HbA1C: No results for input(s): HGBA1C in the last 72 hours. Urine analysis:    Component Value Date/Time   COLORURINE YELLOW 10/28/2020 0429   APPEARANCEUR HAZY (A) 10/28/2020 0429   LABSPEC 1.014 10/28/2020 0429   PHURINE 5.0 10/28/2020  0429   GLUCOSEU NEGATIVE 10/28/2020 0429   HGBUR SMALL (A) 10/28/2020 0429   BILIRUBINUR NEGATIVE 10/28/2020 0429   KETONESUR NEGATIVE 10/28/2020 0429   PROTEINUR 100 (A) 10/28/2020 0429   NITRITE NEGATIVE 10/28/2020 0429   LEUKOCYTESUR NEGATIVE 10/28/2020 0429   Sepsis Labs: _0 (procalcitonin:4,lacticidven:4) ) Recent Results (from the past 240 hour(s))  Culture, blood (Routine X 2) w Reflex to ID Panel     Status: None   Collection Time: 10/24/20  1:34 PM   Specimen: BLOOD RIGHT HAND  Result Value Ref Range Status   Specimen Description   Final    BLOOD RIGHT HAND BOTTLES DRAWN AEROBIC AND ANAEROBIC   Special Requests Blood Culture adequate volume  Final   Culture   Final    NO GROWTH 5 DAYS Performed at [facility], [address], [place], [place] [address]    Report Status 10/29/2020 FINAL  Final  Culture, blood (Routine X 2) w Reflex to ID Panel     Status: None   Collection Time: 10/24/20  1:44 PM   Specimen: BLOOD LEFT WRIST  Result Value Ref Range Status   Specimen Description   Final    BLOOD LEFT WRIST BOTTLES DRAWN AEROBIC AND ANAEROBIC   Special Requests Blood Culture adequate volume  Final   Culture   Final    NO GROWTH 5 DAYS Performed at [facility], [address], [place], [place] [address]    Report Status 10/29/2020 FINAL  Final  Culture, respiratory (non-expectorated)     Status: None   Collection Time: 10/26/20  6:07 PM   Specimen: Tracheal Aspirate; Respiratory  Result Value Ref Range Status   Specimen Description   Final    TRACHEAL ASPIRATE Performed at [facility], [address], [place], [place] [address]    Special Requests   Final    Normal Performed at [facility], [address] [place], [place] [address]    Gram Stain   Final    FEW WBC PRESENT, PREDOMINANTLY PMN MODERATE GRAM POSITIVE RODS FEW GRAM POSITIVE COCCI FEW GRAM NEGATIVE RODS    Culture   Final    FEW Normal respiratory flora-no Staph aureus or Pseudomonas  seen Performed at [facility], [place] [address] [place], [place] [address]    Report Status 10/29/2020 FINAL  Final     Scheduled Meds: . albuterol  2.5 mg Nebulization Q6H  . chlorhexidine gluconate (MEDLINE KIT)  15 mL Mouth Rinse BID  . Chlorhexidine Gluconate Cloth  6 each Topical Daily  . feeding supplement (PROSource TF)  90 mL Per Tube TID  . free water  400 mL Per Tube Q4H  . mouth rinse  15 mL Mouth Rinse 2 times per day  . metoprolol tartrate  12.5 mg Per Tube BID  . pantoprazole sodium  40 mg Per Tube Q1200  . sodium chloride flush  3 mL Intravenous Q12H   Continuous Infusions: . sodium chloride    . sodium chloride 250 mL (11/02/20 1546)  . amiodarone    . dexmedetomidine (PRECEDEX) IV infusion 1 mcg/kg/hr (11/03/20 0606)  .  feeding supplement (VITAL HIGH PROTEIN) 1,000 mL (11/01/20 1344)  . heparin 2,300 Units/hr (11/03/20 0311)    Procedures/Studies: US RENAL  Result Date: 10/28/2020 CLINICAL DATA:  Chronic kidney disease.  Acute kidney injury. EXAM: RENAL / URINARY TRACT ULTRASOUND COMPLETE COMPARISON:  CT 06/12/2018 FINDINGS: Right Kidney: Renal measurements: 11.0 x 6.4 x 5.2 cm = volume: 190 mL. Increased echogenicity. No focal lesion or hydronephrosis. Left Kidney: Renal measurements: 11.2 x 7.6 x 6.2 cm = volume: 276 mL. Increased echogenicity. No focal lesion or hydronephrosis. Bladder: Foley catheter within the bladder. Other: None. IMPRESSION: Slightly enlarged kidneys showing increased echogenicity. Findings could be due to chronic diabetic glomerulonephropathy or acute nephritis. No obstruction or focal lesion. Electronically Signed   By: Nelson Chimes M.D.   On: 10/28/2020 14:30   DG CHEST PORT 1 VIEW  Result Date: 11/02/2020 CLINICAL DATA:  NG tube placement. EXAM: PORTABLE CHEST 1 VIEW COMPARISON:  Radiograph yesterday. FINDINGS: The tip of the enteric tube is below the diaphragm, not included in the field of view. The side-port is not well  visualized but likely below the diaphragm. Stable cardiomegaly. Patchy airspace opacities in both lung bases, unchanged. IMPRESSION: 1. Tip of the enteric tube below the diaphragm, not included in the field of view. 2. Unchanged patchy bibasilar airspace opacities. Electronically Signed   By: Keith Rake M.D.   On: 11/02/2020 21:51   DG CHEST PORT 1 VIEW  Result Date: 11/01/2020 CLINICAL DATA:  Acute on chronic respiratory failure. EXAM: PORTABLE CHEST 1 VIEW COMPARISON:  10/31/2020 FINDINGS: The tracheostomy tube is stable. The NG tube is stable. Much improved left lung aeration when compared to the prior study. The left upper lobe is now aerated. Persistent airspace process in the right lung and at the left lung base. No pleural effusions or pneumothorax. IMPRESSION: 1. Much improved left lung aeration when compared to the prior study. 2. Persistent right lung and left basilar airspace process. Electronically Signed   By: Marijo Sanes M.D.   On: 11/01/2020 05:55   DG CHEST PORT 1 VIEW  Result Date: 10/31/2020 CLINICAL DATA:  Acute on chronic respiratory EXAM: PORTABLE CHEST 1 VIEW COMPARISON:  Failure four days ago FINDINGS: Enteric tube which reaches the stomach. Tracheostomy tube in place. Interval white out of the left chest with volume loss. Progressive hazy opacification of the right lower chest. Heart size is largely obscured. No visible air leak. IMPRESSION: 1. Interval white out of the left chest, likely plugging and pulmonary collapse. 2. Progressive airspace disease or pleural effusion on the right. Electronically Signed   By: Monte Fantasia M.D.   On: 10/31/2020 04:22   DG Chest Port 1 View  Result Date: 10/27/2020 CLINICAL DATA:  Acute respiratory failure EXAM: PORTABLE CHEST 1 VIEW COMPARISON:  Yesterday FINDINGS: Enteric and tracheostomy tubes remain in good position. Cardiomegaly. Interstitial opacity which could be bronchitic or congestive. There is improved aeration at the  right base. Negative for pneumothorax. IMPRESSION: 1. Improved aeration. 2. Bronchitic or congestive interstitial opacity. Electronically Signed   By: Monte Fantasia M.D.   On: 10/27/2020 07:46   DG Chest Port 1 View  Result Date: 10/26/2020 CLINICAL DATA:  Shortness of breath for 2-3 days EXAM: PORTABLE CHEST 1 VIEW COMPARISON:  Three days ago FINDINGS: Progressive hazy appearance of the right mid to lower chest. Dense retrocardiac opacity persists. An enteric tube and tracheostomy tube remain in place. Cardiomegaly and vascular pedicle widening. IMPRESSION: Pleural fluid/pulmonary opacification at the bases which has  progressed from 3 days ago. Electronically Signed   By: Monte Fantasia M.D.   On: 10/26/2020 06:02   DG CHEST PORT 1 VIEW  Result Date: 10/23/2020 CLINICAL DATA:  Shortness of breath. EXAM: PORTABLE CHEST 1 VIEW COMPARISON:  October 22, 2020 FINDINGS: The near complete opacification of the left hemithorax seen on the comparison chest x-ray has resolved. Persistent bibasilar opacities are likely atelectasis. No pneumothorax. A tracheostomy tube is in good position. An NG tube terminates in the stomach. No other acute abnormalities. IMPRESSION: 1. Support apparatus as above. 2. Bibasilar atelectasis and a possible small left effusion. Improved aeration of the left lung. Electronically Signed   By: Dorise Bullion III M.D   On: 10/23/2020 14:57   DG Chest Port 1 View  Result Date: 10/22/2020 CLINICAL DATA:  Acute respiratory failure. EXAM: PORTABLE CHEST 1 VIEW COMPARISON:  10/20/2020 FINDINGS: Tracheostomy tube overlies the airway. The cardiac silhouette is enlarged. There is persistent mild central pulmonary vascular congestion without overt edema. Airspace opacities in the left greater than right mid and lower lungs have slightly improved. Small pleural effusions are questioned. No pneumothorax is identified. IMPRESSION: Slight improvement of bilateral airspace opacities which could  reflect pneumonia or atelectasis. Electronically Signed   By: [name] M.D.   On: 10/22/2020 09:16   DG Chest Port 1 View  Result Date: 10/20/2020 CLINICAL DATA:  Questionable sepsis EXAM: PORTABLE CHEST 1 VIEW COMPARISON:  Chest x-ray 06/12/2018 report without images. FINDINGS: Tracheostomy tube terminates approximately 7.5 cm above the carina. The heart size and mediastinal contours are not well visualized. Bilateral hilar vasculature prominence. Aortic arch calcifications. Silhouetting off of the left hemidiaphragm and left cardiac border. Partial silhouetting off the right cardiac border and right hemidiaphragm. Increased interstitial markings. Likely at least trace to small volume left pleural effusion. No pneumothorax. No acute osseous abnormality. IMPRESSION: 1. Left lung opacity and likely at least trace to small volume left pleural effusion with silhouetting off of the left diaphragm and heart border. Concern for infection/inflammation. 2. Right lower lobe and middle lobe airspace opacity could represent a combination of atelectasis, infection, inflammation. 3. At least mild pulmonary edema. 4. Consider chest x-ray PA and lateral view for further evaluation. Electronically Signed   By: Iven Finn M.D.   On: 10/20/2020 12:26   DG Chest Port 1V same Day  Result Date: 10/22/2020 CLINICAL DATA:  Status post NG tube placement. EXAM: PORTABLE CHEST 1 VIEW COMPARISON:  Single-view of the chest earlier today. FINDINGS: The upper chest is off the margin of the film. NG tube is looped in the upper trachea or esophagus. Visualized left chest is completely whited out on this study. IMPRESSION: NG tube is looped in the upper esophagus or trachea. Complete whiteout of the left chest is new since the study earlier today could be due to mucous plugging and left lung collapse or rapidly developing pleural effusion and airspace disease. Electronically Signed   By: [name] M.D.   On: 10/22/2020  15:16   DG Abd Portable 1V  Result Date: 10/22/2020 CLINICAL DATA:  Nasogastric tube placement EXAM: PORTABLE ABDOMEN - 1 VIEW COMPARISON:  Portable exam 1613 hours compared to 1523 hours FINDINGS: Tip of nasogastric tube projects over distal gastric antrum, consider withdrawal 5 cm. Bowel gas pattern normal. Opacification of the inferior LEFT hemithorax again seen. IMPRESSION: Tip of nasogastric tube projects over distal gastric antrum. Consider withdrawal of nasogastric tube 5 cm. Electronically Signed   By: [name]  M.D.   On: 10/22/2020 16:36   DG Abd Portable 1V  Result Date: 10/22/2020 CLINICAL DATA:  Nasogastric tube placement EXAM: PORTABLE ABDOMEN - 1 VIEW COMPARISON:  None. FINDINGS: Nasogastric tube extends into the gastric cardia where it loops upon itself with the tip at the junction of the mid and distal thirds of the esophagus. There is no bowel dilatation or air-fluid level to suggest bowel obstruction. No free air. There is consolidation left lower lobe. IMPRESSION: Nasogastric tube extends into the stomach where it loops upon itself with the tip at the junction of the mid and distal thirds of the esophagus. No bowel obstruction or free air evident. Consolidation left lower lobe. These results will be called to the ordering clinician or representative by the Radiologist Assistant, and communication documented in the PACS or [employer]. Electronically Signed   By: Lowella Grip III M.D.   On: 10/22/2020 15:34   ECHOCARDIOGRAM COMPLETE  Result Date: 10/21/2020    ECHOCARDIOGRAM REPORT   Patient Name:   RONNEL ZUERCHER Date of Exam: 10/21/2020 Medical Rec #:  [id number]        Height:       68.0 in Accession #:    [id number]       Weight:       325.8 lb Date of Birth:  December 25, 1965        BSA:          2.515 m Patient Age:    70 years         BP:           112/61 mmHg Patient Gender: M                HR:           51 bpm. Exam Location:  [facility] Procedure: 2D Echo  Indications:    CHF-Acute Diastolic U82.80  History:        Patient has no prior history of Echocardiogram examinations.                 CHF; Risk Factors:Non-Smoker. Acute on chronic respiratory                 failure with hypoxia.  Sonographer:    Leavy Cella RDCS (AE) Referring Phys: [phone] Naydelin Ziegler IMPRESSIONS  1. Left ventricular ejection fraction, by estimation, is 60 to 65%. The left ventricle has normal function. The left ventricle has no regional wall motion abnormalities. There is severe left ventricular hypertrophy. Left ventricular diastolic parameters  are indeterminate. There is the interventricular septum is flattened in systole and diastole, consistent with right ventricular pressure and volume overload.  2. Right ventricular systolic function is mildly reduced. The right ventricular size is mildly enlarged. Tricuspid regurgitation signal is inadequate for assessing PA pressure.  3. Right atrial size was upper normal.  4. The pericardial effusion is posterior to the left ventricle.  5. The mitral valve is grossly normal. Trivial mitral valve regurgitation.  6. The aortic valve is tricuspid. Aortic valve regurgitation is not visualized.  7. The inferior vena cava is normal in size with greater than 50% respiratory variability, suggesting right atrial pressure of 3 mmHg. FINDINGS  Left Ventricle: Left ventricular ejection fraction, by estimation, is 60 to 65%. The left ventricle has normal function. The left ventricle has no regional wall motion abnormalities. The left ventricular internal cavity size was normal in size. There is  severe left ventricular hypertrophy. The interventricular septum is flattened in  systole and diastole, consistent with right ventricular pressure and volume overload. Left ventricular diastolic parameters are indeterminate. Right Ventricle: The right ventricular size is mildly enlarged. No increase in right ventricular wall thickness. Right ventricular systolic function  is mildly reduced. Tricuspid regurgitation signal is inadequate for assessing PA pressure. Left Atrium: Left atrial size was normal in size. Right Atrium: Right atrial size was upper normal. Pericardium: Trivial pericardial effusion is present. The pericardial effusion is posterior to the left ventricle. Mitral Valve: The mitral valve is grossly normal. Trivial mitral valve regurgitation. Tricuspid Valve: The tricuspid valve is grossly normal. Tricuspid valve regurgitation is mild. Aortic Valve: The aortic valve is tricuspid. There is moderate aortic valve annular calcification. Aortic valve regurgitation is not visualized. Pulmonic Valve: The pulmonic valve was grossly normal. Pulmonic valve regurgitation is trivial. Aorta: The aortic root is normal in size and structure. Venous: The inferior vena cava is normal in size with greater than 50% respiratory variability, suggesting right atrial pressure of 3 mmHg. IAS/Shunts: No atrial level shunt detected by color flow Doppler.  LEFT VENTRICLE PLAX 2D LVIDd:         4.66 cm  Diastology LVIDs:         2.03 cm  LV e' medial:    5.87 cm/s LV PW:         1.76 cm  LV E/e' medial:  14.0 LV IVS:        1.73 cm  LV e' lateral:   8.49 cm/s LVOT diam:     1.90 cm  LV E/e' lateral: 9.7 LVOT Area:     2.84 cm  RIGHT VENTRICLE RV S prime:     19.40 cm/s TAPSE (M-mode): 1.9 cm LEFT ATRIUM             Index       RIGHT ATRIUM           Index LA diam:        3.50 cm 1.39 cm/m  RA Area:     24.60 cm LA Vol (A2C):   43.9 ml 17.46 ml/m RA Volume:   82.30 ml  32.73 ml/m LA Vol (A4C):   43.8 ml 17.42 ml/m LA Biplane Vol: 44.4 ml 17.66 ml/m   AORTA Ao Root diam: 3.10 cm MITRAL VALVE MV Area (PHT): 2.64 cm    SHUNTS MV Decel Time: 287 msec    Systemic Diam: 1.90 cm MV E velocity: 82.30 cm/s MV A velocity: 59.10 cm/s MV E/A ratio:  1.39 Rozann Lesches MD Electronically signed by Rozann Lesches MD Signature Date/Time: 10/21/2020/4:33:43 PM    Final     [name], DO  [facility]  If 7PM-7AM, please contact night-coverage [email or web address] Password [email or web address] 11/03/2020, 1:02 PM   LOS: 14 days

## 2020-11-03 NOTE — Progress Notes (Signed)
Nutrition Follow-up  DOCUMENTATION CODES:   Morbid obesity  INTERVENTION:  Vital High Protein infusing @ 40 ml/hr and ProSource TF- 90 ml -TID via NGT.   Free water flushes increased: 400 ml every 4 hrs.  Tube feeding regimen currently providing 1200 kcal, 150 grams protein, and 836 ml H2O. Free water flushes provides additional-2400 ml daily.  Current tube feeds are meeting 81% energy and 100% protein needs, and 100% est fluid needs met.  NUTRITION DIAGNOSIS:   Inadequate oral intake related to acute illness (respiratory failure) as evidenced by NPO status.  -addressed with tube feeding  GOAL:   Provide needs based on ASPEN/SCCM guidelines   -met with entral nutiriton  MONITOR:   Vent status,Labs,TF tolerance,I & O's,Skin  ASSESSMENT:   Patient is a 54 yo male with history of CKD, CHF. GOUT, Hypertension and Tracheostomy dependence. Chest x-ray findings- left side pleural effusion. acute on chronic hypoxemic respiratory failure /volume overload, CAP, AKI.  12/15-New cuffed trach 12/21-Bedside bronch   Patient is currently intubated on ventilator support. Sodium trending up free water started. Recommend add protein supplement to meet est needs. Patient weight is down 20 kg from admission.  Family at beside today. Patient eyes are open. Discussed with nursing. Patient is tolerating tube feeds and is progressing overall per report.  12/27- Discussed with MD protein supplement added to tube feeding regimen. 12/28- CXR-bilateral patchy opacities 12/29 Patient is awake and gestures with his hands. Talked with nurse- patient progressing. Per MD pt on vent with trach and trach collar trials during day. Acute on chronic CHF/Cor pulomale- Negative 16.6 liters.   Intake/Output Summary (Last 24 hours) at 11/03/2020 1347 Last data filed at 11/03/2020 1100 Gross per 24 hour  Intake 10 ml  Output 4100 ml  Net -4090 ml    MV: 10.6  L/min Temp (24hrs), Avg:98.6 F (37 C),  Min:98.3 F (36.8 C), Max:98.8 F (37.1 C)  Precedex-4 mcg/ml  Medications reviewed and include: protonix, amiodarone 16.67 ml/hr, Lopressor 12.5 mg BID.  Labs reviewed:  BMP Latest Ref Rng & Units 11/03/2020 11/02/2020 11/01/2020  Glucose 70 - 99 mg/dL 126(H) 118(H) 136(H)  BUN 6 - 20 mg/dL 104(H) 104(H) 102(H)  Creatinine 0.61 - 1.24 mg/dL 2.83(H) 3.16(H) 3.43(H)  Sodium 135 - 145 mmol/L 138 139 136  Potassium 3.5 - 5.1 mmol/L 4.0 4.3 3.9  Chloride 98 - 111 mmol/L 103 101 98  CO2 22 - 32 mmol/L '26 26 26  ' Calcium 8.9 - 10.3 mg/dL 9.7 9.4 9.2      Diet Order:   Diet Order            Diet NPO time specified  Diet effective midnight                 EDUCATION NEEDS:   Not appropriate for education at this time  Skin:  Skin Assessment: Reviewed RN Assessment  Last BM:  12/27  Height:   Ht Readings from Last 1 Encounters:  11/01/20 '5\' 7"'  (1.702 m)    Weight:   Wt Readings from Last 1 Encounters:  11/02/20 130.5 kg  Admit weight -146.6 kg  Ideal Body Weight:   70 kg  BMI:  Body mass index is 45.06 kg/m.  Estimated Nutritional Needs:   Kcal:  9604-5409  Protein:  140-154  Fluid:  < 2 liters   Colman Cater MS,RD,CSG,LDN Pager: Shea Evans

## 2020-11-04 DIAGNOSIS — J9811 Atelectasis: Secondary | ICD-10-CM | POA: Diagnosis not present

## 2020-11-04 DIAGNOSIS — N179 Acute kidney failure, unspecified: Secondary | ICD-10-CM | POA: Diagnosis not present

## 2020-11-04 DIAGNOSIS — I5033 Acute on chronic diastolic (congestive) heart failure: Secondary | ICD-10-CM | POA: Diagnosis not present

## 2020-11-04 DIAGNOSIS — J9620 Acute and chronic respiratory failure, unspecified whether with hypoxia or hypercapnia: Secondary | ICD-10-CM | POA: Diagnosis not present

## 2020-11-04 LAB — RENAL FUNCTION PANEL
Albumin: 2.3 g/dL — ABNORMAL LOW (ref 3.5–5.0)
Anion gap: 10 (ref 5–15)
BUN: 103 mg/dL — ABNORMAL HIGH (ref 6–20)
CO2: 28 mmol/L (ref 22–32)
Calcium: 9.9 mg/dL (ref 8.9–10.3)
Chloride: 105 mmol/L (ref 98–111)
Creatinine, Ser: 2.52 mg/dL — ABNORMAL HIGH (ref 0.61–1.24)
GFR, Estimated: 30 mL/min — ABNORMAL LOW (ref >60)
Glucose, Bld: 136 mg/dL — ABNORMAL HIGH (ref 70–99)
Phosphorus: 5 mg/dL — ABNORMAL HIGH (ref 2.5–4.6)
Potassium: 4.2 mmol/L (ref 3.5–5.1)
Sodium: 143 mmol/L (ref 135–145)

## 2020-11-04 LAB — CBC
HCT: 39.2 % (ref 39.0–52.0)
Hemoglobin: 11.1 g/dL — ABNORMAL LOW (ref 13.0–17.0)
MCH: 28.8 pg (ref 26.0–34.0)
MCHC: 28.3 g/dL — ABNORMAL LOW (ref 30.0–36.0)
MCV: 101.6 fL — ABNORMAL HIGH (ref 80.0–100.0)
Platelets: 199 10*3/uL (ref 150–400)
RBC: 3.86 MIL/uL — ABNORMAL LOW (ref 4.22–5.81)
RDW: 14 % (ref 11.5–15.5)
WBC: 5.9 10*3/uL (ref 4.0–10.5)
nRBC: 0 % (ref 0.0–0.2)

## 2020-11-04 LAB — GLUCOSE, CAPILLARY
Glucose-Capillary: 106 mg/dL — ABNORMAL HIGH (ref 70–99)
Glucose-Capillary: 122 mg/dL — ABNORMAL HIGH (ref 70–99)
Glucose-Capillary: 122 mg/dL — ABNORMAL HIGH (ref 70–99)
Glucose-Capillary: 126 mg/dL — ABNORMAL HIGH (ref 70–99)
Glucose-Capillary: 129 mg/dL — ABNORMAL HIGH (ref 70–99)
Glucose-Capillary: 131 mg/dL — ABNORMAL HIGH (ref 70–99)
Glucose-Capillary: 131 mg/dL — ABNORMAL HIGH (ref 70–99)

## 2020-11-04 LAB — HEPARIN LEVEL (UNFRACTIONATED): Heparin Unfractionated: 0.32 IU/mL (ref 0.30–0.70)

## 2020-11-04 MED ORDER — CLONIDINE HCL 0.1 MG PO TABS
0.1000 mg | ORAL_TABLET | Freq: Three times a day (TID) | ORAL | Status: DC
  Administered 2020-11-04 – 2020-11-09 (×15): 0.1 mg
  Filled 2020-11-04 (×22): qty 1

## 2020-11-04 MED ORDER — ALBUTEROL SULFATE (2.5 MG/3ML) 0.083% IN NEBU
2.5000 mg | INHALATION_SOLUTION | Freq: Four times a day (QID) | RESPIRATORY_TRACT | Status: DC
  Administered 2020-11-04 – 2020-11-09 (×22): 2.5 mg via RESPIRATORY_TRACT
  Filled 2020-11-04 (×22): qty 3

## 2020-11-04 MED ORDER — ACETYLCYSTEINE 20 % IN SOLN
1.0000 mL | Freq: Four times a day (QID) | RESPIRATORY_TRACT | Status: DC
  Administered 2020-11-04 – 2020-11-08 (×17): 1 mL via RESPIRATORY_TRACT
  Filled 2020-11-04 (×16): qty 4

## 2020-11-04 NOTE — Progress Notes (Signed)
ANTICOAGULATION CONSULT NOTE  Pharmacy Consult:  Heparin Indication: atrial fibrillation  No Known Allergies  Patient Measurements: Height: 5\' 7"  (170.2 cm) Weight: 130.5 kg (287 lb 11.2 oz) IBW/kg (Calculated) : 66.1 HEPARIN DW (KG): 103.8  Vital Signs: Temp: 98.6 F (37 C) (12/30 0400) Temp Source: Oral (12/30 0400) BP: 142/75 (12/30 0700) Pulse Rate: 63 (12/30 0700)  Labs: Recent Labs    11/01/20 1145 11/02/20 0440 11/02/20 0440 11/02/20 1659 11/03/20 0223 11/04/20 0519  HGB 11.9*  --   --   --  12.1* 11.1*  HCT 40.5  --   --   --  40.9 39.2  PLT 172  --   --   --  177 199  HEPARINUNFRC  --  0.24*   < > 0.29* 0.37 0.32  CREATININE  --  3.16*  --   --  2.83* 2.52*   < > = values in this interval not displayed.    Estimated Creatinine Clearance: 43.6 mL/min (A) (by C-G formula based on SCr of 2.52 mg/dL (H)).  Assessment: Pharmacy consulted to dose heparin infusion for this 54 yo male with atrial fibrillation.  Patient was  taking apixaban PTA and was on treatment-dose Lovenox from 12-17 to 12-20. Heparin is being started due to patient's unstable renal function.   HL 0.32-  therapeutic.   Goal of Therapy:  Heparin level 0.3-0.7 units/ml Monitor platelets by anticoagulation protocol: Yes   Plan:  Continue heparin infusion at 2300 units/hr Daily HL, CBC Monitor for signs and symptoms of bleeding  Margot Ables, PharmD Clinical Pharmacist 11/04/2020 7:52 AM

## 2020-11-04 NOTE — TOC Progression Note (Signed)
Transition of Care [facility]) - Progression Note    Patient Details  Name: Jack Cox MRN: [id number] Date of Birth: Jul 07, 1966  [facility]) CM/SW Contact  Natasha Bence, LCSW Phone Number: 11/04/2020, 5:47 PM  Clinical Narrative:    CSW discussed patient's admission to [facility] with Raquel Sarna from [place]. Raquel Sarna reported that the insurance company is requesting a peer to peer with MD. [place] notified MD and provided the number for the insurance's Medical Director. TOC to follow.    Expected Discharge Plan: Home/Self Care Barriers to Discharge: Continued Medical Work up  Expected Discharge Plan and Services Expected Discharge Plan: Home/Self Care In-house Referral: Clinical Social Work Discharge Planning Services: NA   Living arrangements for the past 2 months: Single Family Home                 DME Arranged: N/A DME Agency: NA       HH Arranged: NA HH Agency: NA         Social Determinants of Health (SDOH) Interventions    Readmission Risk Interventions No flowsheet data found.

## 2020-11-04 NOTE — Progress Notes (Addendum)
Palliative: Goals are set for LTAC with [facility] if insurance approved. Palliative to shadow for declines.  Plan: Continue full scope/full code.  [facility] if approved by insurance.  No charge Quinn Axe, NP Palliative medicine team Team phone [phone] Greater than 50% of this time was spent counseling and coordinating care related to the above assessment and plan.

## 2020-11-04 NOTE — Progress Notes (Signed)
PROGRESS NOTE  Jack Cox MNO:[id number] DOB: 10/27/1966 DOA: 10/20/2020 PCP: [name], MD  Brief History:  54 y.o.malewith medical history ofdiastolic CHF, atrial flutter, obstructive sleep apnea, hypertension, right bundle branch block presenting with shortness of breath for the last 2 to 3 days. Unfortunately, the patient is encephalopathic at the time of my evaluation; therefore, history is limited. Apparently, the patient went to see his nephrologist earlier on 10/20/2020. He was noted to have oxygen saturation in the 60s. As result, he was sent to the emergency department for further evaluation. Initially, the patient was awake and conversant with oxygen saturation in the low 70s. He was placed on a nonrebreather after which the patient became obtunded. VBG showed pH 7.1 66/97/106 on 80%. Subsequently, the patient was placed on the ventilator with PRVC,PEEP of 5, FiO2 70%. After approximately 10 to 15 minutes on the ventilator, the patient remained somnolent, but awakened and was able to tell me his name. He did state that he has not been taking his torsemide as directed. In addition, the patient has been sleeping in a recliner for an unclear duration of time secondary to shortness of breath. At baseline, the patient has a size 4 noncuffed tracheostomy. He is on supplemental oxygen 2.5 L during the day and 4 L at nighttime. There is no reports of nausea, vomiting, diarrhea, chest pain, abdominal pain. In the emergency department, the patient was afebrile and hemodynamically stable with oxygen saturation 93-94% on the ventilator with FiO2 70%. Chest x-ray showed left-sided pleural effusion with increased interstitial markings. BNP 790. PCT 0.12. BMP showed a serum creatinine 2.43. LFTs were unremarkable. WBC 8.1, hemoglobin 14.5, platelets 253,000. The patient was given ceftriaxone and azithromycin. He was also given furosemide 80 mg IV x1. COVID-19 RT-PCR  negative. He was continued on IV lasix 80 mg bid with uptrending of his serum creatinine. Renal was consulted to assist.  Pulmonary was also consulted to assist also.  Assessment/Plan: Acute on chronic respiratory failure with hypoxia and hypercarbia -Secondary to CHF in the setting of OSA -remainson ventilator with trach with trach collar trials during day -pulmonary consulted to help with vent management -dyssynchronous on prvc when not sedated 12/17 > changed to PCV -11/02/20 personally reviewed CXR--increased interstitial markings--bilateral patchy opacities -PCT 0.12>>0.61 -having mucus plugging--needs aggressive suctioning -12/17 and 12/26 CXR--white out of left lung likely mucus plugging -Mucomyst 12/21>>12/27, restart 12/30 -10/26/20-bronchoscopy--Thick inspissated mucus in proximal airways & BL lower lobes, removed with suctioning - hypertonic saline nebulizer treatments started by pulmonology 12/21>>12/27 -trach collar trials 12/28>> -mucomyst d/ced 12/27, restarted on 12/30 due to mucus plugging  Acute on chronic diastolic CHF/Cor Pulmonale -06/25/2019 echo EF 55%, trivial TR -12/16/21echo--EF 60-65%, no WMA, RV pressure overload, mild TR -he has been diuresing well with no diuretic support -Following BMP -Accurate I's and O's--NEG 18.6L -Daily weights  Atrial flutter- controlled -apixabantemporarilyon hold, he is on an IV heparin infusionfor full anticoagulation -rate controlledbut had an episode 3-4 hours 12/18-12/19 evening -resumed low dose metoprolol 12.5 mg BID per tube   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  -Monitor with diuresis -consult nephrology--appreciated-->albumin+lasix bid per renal -last dose of lasix was 10/25/20 -last dose albumin 12/26  FEN -had mild hypoglycemia -NG placed 12/17 -switchedto Vital High Protein _0   cc/hr-->hypoglycemia improved  Hypernatremia- resolved now after adding more free water to feeding tuberegimen.  Fever - improving with pneumonia management -101.0 fever evening 12/18 and 100.7 12/21 -blood cultures x 2--neg to date -UA and urine culture--no significant pyuria -12/16 trach aspirate-->flora -10/26/20-->BAL sent for culture -completed course of cefepime 12/27  Essential hypertension -overall stable -metoprolol low dose with holding parameters -clonidine initiated by pulm  OSA -The patient has a tracheostomy placed in 2001 -He has a size 4 cuffless trach  Right bundle branch block -This has been chronic with review of the medical records  Morbid obesity -BMI 52.94 -lifestyle modification  GOC -palliative following -currently full scope of care  Status is: Inpatient  Remains inpatient appropriate because:IV treatments appropriate due to intensity of illness or inability to take PO  Dispo: The patient is from:Home Anticipated d/c is YC:XKGY Anticipated d/c date is: >3 days Patient currently is not medically stable to d/c.   Family Communication:brotherupdated bedside 12/20-21  Consultants:pulm, renal  Code Status: FULL  DVT Prophylaxis: IV heparin infusion for full anticoagulation   Procedures: As Listed in Progress Note Above  Antibiotics: Cefepime 12/21>>12/27      Subjective: Patient denies fevers, chills, headache, chest pain, dyspnea, nausea, vomiting, diarrhea, abdominal pain, dysuria, hematuria,    Objective: Vitals:   11/04/20 1100 11/04/20 1125 11/04/20 1500 11/04/20 1611  BP: 126/61  137/64   Pulse: 67  69   Resp: 18  11   Temp:  98.1 F (36.7 C)    TempSrc:  Oral    SpO2: 96%  91% 90%  Weight:      Height:        Intake/Output Summary (Last 24 hours) at 11/04/2020 1808 Last data filed at 11/04/2020 1547 Gross per 24 hour  Intake 2400.77 ml   Output 2800 ml  Net -399.23 ml   Weight change:  Exam:   General:  Pt is alert, follows commands appropriately, not in acute distress  HEENT: No icterus, No thrush, No neck mass, [place]/AT  Cardiovascular: RRR, S1/S2, no rubs, no gallops  Respiratory: bilateral scattered rhonchi  Abdomen: Soft/+BS, non tender, non distended, no guarding  Extremities: 1+LE edema, No lymphangitis, No petechiae, No rashes, no synovitis   Data Reviewed: I have personally reviewed following labs and imaging studies Basic Metabolic Panel: Recent Labs  Lab 10/31/20 0329 11/01/20 0737 11/02/20 0440 11/03/20 0223 11/04/20 0519  NA 145 136 139 138 143  K 4.1 3.9 4.3 4.0 4.2  CL 102 98 101 103 105  CO2 _0 GLUCOSE 155* 136* 118* 126* 136*  BUN 106* 102* 104* 104* 103*  CREATININE 4.21* 3.43* 3.16* 2.83* 2.52*  CALCIUM 9.2 9.2 9.4 9.7 9.9  PHOS 4.2 4.6 5.1* 4.8* 5.0*   Liver Function Tests: Recent Labs  Lab 10/31/20 0329 11/01/20 0737 11/02/20 0440 11/03/20 0223 11/04/20 0519  ALBUMIN 2.3* 2.5* 2.6* 2.5* 2.3*   No results for input(s): LIPASE, AMYLASE in the last 168 hours. No results for input(s): AMMONIA in the last 168 hours. Coagulation Profile: No results for input(s): INR, PROTIME in the last 168 hours. CBC: Recent Labs  Lab 10/30/20 0444 10/31/20 0329 11/01/20 1145 11/03/20 0223 11/04/20 0519  WBC 9.6 8.5 7.3 6.1 5.9  HGB 12.7* 11.5* 11.9* 12.1* 11.1*  HCT 45.0 41.4 40.5 40.9 39.2  MCV 103.2* 104.8* 101.0* 100.2* 101.6*  PLT 189 169 172 177 199   Cardiac Enzymes: No results for input(s): CKTOTAL, CKMB, CKMBINDEX, TROPONINI in the last 168 hours. BNP: Invalid input(s): POCBNP CBG: Recent Labs  Lab 11/03/20 2359 11/04/20 0449  11/04/20 0754 11/04/20 1127 11/04/20 1557  GLUCAP 122* 131* 126* 106* 122*   HbA1C: No results for input(s): HGBA1C in the last 72 hours. Urine analysis:    Component Value Date/Time   COLORURINE YELLOW 10/28/2020 0429    APPEARANCEUR HAZY (A) 10/28/2020 0429   LABSPEC 1.014 10/28/2020 0429   PHURINE 5.0 10/28/2020 0429   GLUCOSEU NEGATIVE 10/28/2020 0429   HGBUR SMALL (A) 10/28/2020 0429   BILIRUBINUR NEGATIVE 10/28/2020 0429   KETONESUR NEGATIVE 10/28/2020 0429   PROTEINUR 100 (A) 10/28/2020 0429   NITRITE NEGATIVE 10/28/2020 0429   LEUKOCYTESUR NEGATIVE 10/28/2020 0429   Sepsis Labs: _0 (procalcitonin:4,lacticidven:4) ) Recent Results (from the past 240 hour(s))  Culture, respiratory (non-expectorated)     Status: None   Collection Time: 10/26/20  6:07 PM   Specimen: Tracheal Aspirate; Respiratory  Result Value Ref Range Status   Specimen Description   Final    TRACHEAL ASPIRATE Performed at [facility], [address] [place], [place] [address]    Special Requests   Final    Normal Performed at [facility], [address] [place], [place] [address]    Gram Stain   Final    FEW WBC PRESENT, PREDOMINANTLY PMN MODERATE GRAM POSITIVE RODS FEW GRAM POSITIVE COCCI FEW GRAM NEGATIVE RODS    Culture   Final    FEW Normal respiratory flora-no Staph aureus or Pseudomonas seen Performed at [facility], [place] [address] [place], [place] [address]    Report Status 10/29/2020 FINAL  Final     Scheduled Meds: . acetylcysteine  1 mL Nebulization QID  . albuterol  2.5 mg Nebulization QID  . chlorhexidine gluconate (MEDLINE KIT)  15 mL Mouth Rinse BID  . Chlorhexidine Gluconate Cloth  6 each Topical Daily  . cloNIDine  0.1 mg Per Tube TID  . feeding supplement (PROSource TF)  90 mL Per Tube TID  . free water  400 mL Per Tube Q4H  . mouth rinse  15 mL Mouth Rinse 2 times per day  . metoprolol tartrate  12.5 mg Per Tube BID  . pantoprazole sodium  40 mg Per Tube Q1200  . sodium chloride flush  3 mL Intravenous Q12H   Continuous Infusions: . sodium chloride    . sodium chloride 250 mL (11/02/20 1546)  . amiodarone    . dexmedetomidine (PRECEDEX) IV infusion 1.2 mcg/kg/hr  (11/04/20 1549)  . feeding supplement (VITAL HIGH PROTEIN) 1,000 mL (11/04/20 1600)  . heparin 2,300 Units/hr (11/04/20 0732)    Procedures/Studies: US RENAL  Result Date: 10/28/2020 CLINICAL DATA:  Chronic kidney disease.  Acute kidney injury. EXAM: RENAL / URINARY TRACT ULTRASOUND COMPLETE COMPARISON:  CT 06/12/2018 FINDINGS: Right Kidney: Renal measurements: 11.0 x 6.4 x 5.2 cm = volume: 190 mL. Increased echogenicity. No focal lesion or hydronephrosis. Left Kidney: Renal measurements: 11.2 x 7.6 x 6.2 cm = volume: 276 mL. Increased echogenicity. No focal lesion or hydronephrosis. Bladder: Foley catheter within the bladder. Other: None. IMPRESSION: Slightly enlarged kidneys showing increased echogenicity. Findings could be due to chronic diabetic glomerulonephropathy or acute nephritis. No obstruction or focal lesion. Electronically Signed   By: Nelson Chimes M.D.   On: 10/28/2020 14:30   DG CHEST PORT 1 VIEW  Result Date: 11/02/2020 CLINICAL DATA:  NG tube placement. EXAM: PORTABLE CHEST 1 VIEW COMPARISON:  Radiograph yesterday. FINDINGS: The tip of the enteric tube is below the diaphragm, not included in the field of view. The side-port is not well visualized but  likely below the diaphragm. Stable cardiomegaly. Patchy airspace opacities in both lung bases, unchanged. IMPRESSION: 1. Tip of the enteric tube below the diaphragm, not included in the field of view. 2. Unchanged patchy bibasilar airspace opacities. Electronically Signed   By: Keith Rake M.D.   On: 11/02/2020 21:51   DG CHEST PORT 1 VIEW  Result Date: 11/01/2020 CLINICAL DATA:  Acute on chronic respiratory failure. EXAM: PORTABLE CHEST 1 VIEW COMPARISON:  10/31/2020 FINDINGS: The tracheostomy tube is stable. The NG tube is stable. Much improved left lung aeration when compared to the prior study. The left upper lobe is now aerated. Persistent airspace process in the right lung and at the left lung base. No pleural effusions or  pneumothorax. IMPRESSION: 1. Much improved left lung aeration when compared to the prior study. 2. Persistent right lung and left basilar airspace process. Electronically Signed   By: Marijo Sanes M.D.   On: 11/01/2020 05:55   DG CHEST PORT 1 VIEW  Result Date: 10/31/2020 CLINICAL DATA:  Acute on chronic respiratory EXAM: PORTABLE CHEST 1 VIEW COMPARISON:  Failure four days ago FINDINGS: Enteric tube which reaches the stomach. Tracheostomy tube in place. Interval white out of the left chest with volume loss. Progressive hazy opacification of the right lower chest. Heart size is largely obscured. No visible air leak. IMPRESSION: 1. Interval white out of the left chest, likely plugging and pulmonary collapse. 2. Progressive airspace disease or pleural effusion on the right. Electronically Signed   By: Monte Fantasia M.D.   On: 10/31/2020 04:22   DG Chest Port 1 View  Result Date: 10/27/2020 CLINICAL DATA:  Acute respiratory failure EXAM: PORTABLE CHEST 1 VIEW COMPARISON:  Yesterday FINDINGS: Enteric and tracheostomy tubes remain in good position. Cardiomegaly. Interstitial opacity which could be bronchitic or congestive. There is improved aeration at the right base. Negative for pneumothorax. IMPRESSION: 1. Improved aeration. 2. Bronchitic or congestive interstitial opacity. Electronically Signed   By: Monte Fantasia M.D.   On: 10/27/2020 07:46   DG Chest Port 1 View  Result Date: 10/26/2020 CLINICAL DATA:  Shortness of breath for 2-3 days EXAM: PORTABLE CHEST 1 VIEW COMPARISON:  Three days ago FINDINGS: Progressive hazy appearance of the right mid to lower chest. Dense retrocardiac opacity persists. An enteric tube and tracheostomy tube remain in place. Cardiomegaly and vascular pedicle widening. IMPRESSION: Pleural fluid/pulmonary opacification at the bases which has progressed from 3 days ago. Electronically Signed   By: Monte Fantasia M.D.   On: 10/26/2020 06:02   DG CHEST PORT 1  VIEW  Result Date: 10/23/2020 CLINICAL DATA:  Shortness of breath. EXAM: PORTABLE CHEST 1 VIEW COMPARISON:  October 22, 2020 FINDINGS: The near complete opacification of the left hemithorax seen on the comparison chest x-ray has resolved. Persistent bibasilar opacities are likely atelectasis. No pneumothorax. A tracheostomy tube is in good position. An NG tube terminates in the stomach. No other acute abnormalities. IMPRESSION: 1. Support apparatus as above. 2. Bibasilar atelectasis and a possible small left effusion. Improved aeration of the left lung. Electronically Signed   By: Dorise Bullion III M.D   On: 10/23/2020 14:57   DG Chest Port 1 View  Result Date: 10/22/2020 CLINICAL DATA:  Acute respiratory failure. EXAM: PORTABLE CHEST 1 VIEW COMPARISON:  10/20/2020 FINDINGS: Tracheostomy tube overlies the airway. The cardiac silhouette is enlarged. There is persistent mild central pulmonary vascular congestion without overt edema. Airspace opacities in the left greater than right mid and lower lungs have slightly  improved. Small pleural effusions are questioned. No pneumothorax is identified. IMPRESSION: Slight improvement of bilateral airspace opacities which could reflect pneumonia or atelectasis. Electronically Signed   By: [name] M.D.   On: 10/22/2020 09:16   DG Chest Port 1 View  Result Date: 10/20/2020 CLINICAL DATA:  Questionable sepsis EXAM: PORTABLE CHEST 1 VIEW COMPARISON:  Chest x-ray 06/12/2018 report without images. FINDINGS: Tracheostomy tube terminates approximately 7.5 cm above the carina. The heart size and mediastinal contours are not well visualized. Bilateral hilar vasculature prominence. Aortic arch calcifications. Silhouetting off of the left hemidiaphragm and left cardiac border. Partial silhouetting off the right cardiac border and right hemidiaphragm. Increased interstitial markings. Likely at least trace to small volume left pleural effusion. No pneumothorax. No acute  osseous abnormality. IMPRESSION: 1. Left lung opacity and likely at least trace to small volume left pleural effusion with silhouetting off of the left diaphragm and heart border. Concern for infection/inflammation. 2. Right lower lobe and middle lobe airspace opacity could represent a combination of atelectasis, infection, inflammation. 3. At least mild pulmonary edema. 4. Consider chest x-ray PA and lateral view for further evaluation. Electronically Signed   By: Iven Finn M.D.   On: 10/20/2020 12:26   DG Chest Port 1V same Day  Result Date: 10/22/2020 CLINICAL DATA:  Status post NG tube placement. EXAM: PORTABLE CHEST 1 VIEW COMPARISON:  Single-view of the chest earlier today. FINDINGS: The upper chest is off the margin of the film. NG tube is looped in the upper trachea or esophagus. Visualized left chest is completely whited out on this study. IMPRESSION: NG tube is looped in the upper esophagus or trachea. Complete whiteout of the left chest is new since the study earlier today could be due to mucous plugging and left lung collapse or rapidly developing pleural effusion and airspace disease. Electronically Signed   By: [name] M.D.   On: 10/22/2020 15:16   DG Abd Portable 1V  Result Date: 10/22/2020 CLINICAL DATA:  Nasogastric tube placement EXAM: PORTABLE ABDOMEN - 1 VIEW COMPARISON:  Portable exam 1613 hours compared to 1523 hours FINDINGS: Tip of nasogastric tube projects over distal gastric antrum, consider withdrawal 5 cm. Bowel gas pattern normal. Opacification of the inferior LEFT hemithorax again seen. IMPRESSION: Tip of nasogastric tube projects over distal gastric antrum. Consider withdrawal of nasogastric tube 5 cm. Electronically Signed   By: [name] M.D.   On: 10/22/2020 16:36   DG Abd Portable 1V  Result Date: 10/22/2020 CLINICAL DATA:  Nasogastric tube placement EXAM: PORTABLE ABDOMEN - 1 VIEW COMPARISON:  None. FINDINGS: Nasogastric tube extends into the  gastric cardia where it loops upon itself with the tip at the junction of the mid and distal thirds of the esophagus. There is no bowel dilatation or air-fluid level to suggest bowel obstruction. No free air. There is consolidation left lower lobe. IMPRESSION: Nasogastric tube extends into the stomach where it loops upon itself with the tip at the junction of the mid and distal thirds of the esophagus. No bowel obstruction or free air evident. Consolidation left lower lobe. These results will be called to the ordering clinician or representative by the Radiologist Assistant, and communication documented in the PACS or [employer]. Electronically Signed   By: Lowella Grip III M.D.   On: 10/22/2020 15:34   ECHOCARDIOGRAM COMPLETE  Result Date: 10/21/2020    ECHOCARDIOGRAM REPORT   Patient Name:   EMIT KUENZEL Date of Exam: 10/21/2020 Medical Rec #:  [id number]        Height:       68.0 in Accession #:    [id number]       Weight:       325.8 lb Date of Birth:  1966-11-05        BSA:          2.515 m Patient Age:    20 years         BP:           112/61 mmHg Patient Gender: M                HR:           51 bpm. Exam Location:  [facility] Procedure: 2D Echo Indications:    CHF-Acute Diastolic N98.92  History:        Patient has no prior history of Echocardiogram examinations.                 CHF; Risk Factors:Non-Smoker. Acute on chronic respiratory                 failure with hypoxia.  Sonographer:    Leavy Cella RDCS (AE) Referring Phys: [phone] Aleisa Howk IMPRESSIONS  1. Left ventricular ejection fraction, by estimation, is 60 to 65%. The left ventricle has normal function. The left ventricle has no regional wall motion abnormalities. There is severe left ventricular hypertrophy. Left ventricular diastolic parameters  are indeterminate. There is the interventricular septum is flattened in systole and diastole, consistent with right ventricular pressure and volume overload.  2. Right ventricular  systolic function is mildly reduced. The right ventricular size is mildly enlarged. Tricuspid regurgitation signal is inadequate for assessing PA pressure.  3. Right atrial size was upper normal.  4. The pericardial effusion is posterior to the left ventricle.  5. The mitral valve is grossly normal. Trivial mitral valve regurgitation.  6. The aortic valve is tricuspid. Aortic valve regurgitation is not visualized.  7. The inferior vena cava is normal in size with greater than 50% respiratory variability, suggesting right atrial pressure of 3 mmHg. FINDINGS  Left Ventricle: Left ventricular ejection fraction, by estimation, is 60 to 65%. The left ventricle has normal function. The left ventricle has no regional wall motion abnormalities. The left ventricular internal cavity size was normal in size. There is  severe left ventricular hypertrophy. The interventricular septum is flattened in systole and diastole, consistent with right ventricular pressure and volume overload. Left ventricular diastolic parameters are indeterminate. Right Ventricle: The right ventricular size is mildly enlarged. No increase in right ventricular wall thickness. Right ventricular systolic function is mildly reduced. Tricuspid regurgitation signal is inadequate for assessing PA pressure. Left Atrium: Left atrial size was normal in size. Right Atrium: Right atrial size was upper normal. Pericardium: Trivial pericardial effusion is present. The pericardial effusion is posterior to the left ventricle. Mitral Valve: The mitral valve is grossly normal. Trivial mitral valve regurgitation. Tricuspid Valve: The tricuspid valve is grossly normal. Tricuspid valve regurgitation is mild. Aortic Valve: The aortic valve is tricuspid. There is moderate aortic valve annular calcification. Aortic valve regurgitation is not visualized. Pulmonic Valve: The pulmonic valve was grossly normal. Pulmonic valve regurgitation is trivial. Aorta: The aortic root is  normal in size and structure. Venous: The inferior vena cava is normal in size with greater than 50% respiratory variability, suggesting right atrial pressure of 3 mmHg. IAS/Shunts: No atrial level shunt detected by color flow Doppler.  LEFT VENTRICLE PLAX  2D LVIDd:         4.66 cm  Diastology LVIDs:         2.03 cm  LV e' medial:    5.87 cm/s LV PW:         1.76 cm  LV E/e' medial:  14.0 LV IVS:        1.73 cm  LV e' lateral:   8.49 cm/s LVOT diam:     1.90 cm  LV E/e' lateral: 9.7 LVOT Area:     2.84 cm  RIGHT VENTRICLE RV S prime:     19.40 cm/s TAPSE (M-mode): 1.9 cm LEFT ATRIUM             Index       RIGHT ATRIUM           Index LA diam:        3.50 cm 1.39 cm/m  RA Area:     24.60 cm LA Vol (A2C):   43.9 ml 17.46 ml/m RA Volume:   82.30 ml  32.73 ml/m LA Vol (A4C):   43.8 ml 17.42 ml/m LA Biplane Vol: 44.4 ml 17.66 ml/m   AORTA Ao Root diam: 3.10 cm MITRAL VALVE MV Area (PHT): 2.64 cm    SHUNTS MV Decel Time: 287 msec    Systemic Diam: 1.90 cm MV E velocity: 82.30 cm/s MV A velocity: 59.10 cm/s MV E/A ratio:  1.39 Rozann Lesches MD Electronically signed by Rozann Lesches MD Signature Date/Time: 10/21/2020/4:33:43 PM    Final     [name], DO  [facility]  If 7PM-7AM, please contact night-coverage [email or web address] Password [email or web address] 11/04/2020, 6:08 PM   LOS: 15 days

## 2020-11-04 NOTE — Progress Notes (Signed)
NAME:  Jack Cox, MRN:  [id number], DOB:  06/12/1966, LOS: 42 ADMISSION DATE:  10/20/2020, CONSULTATION DATE:  10/20/20  REFERRING MD:  Tat, Triad  CHIEF COMPLAINT:  Resp distress   Brief History   54 yobm  never smoker with MO complicated by severe OSA req trach 2001 last eval by [facility] 2018 also followed by ENT admit with increasing sob x 48 h pta s fever /purulent sputum and found to have severe hypercabic hypoxemic resp failure on arrival to ER am 12/15 so placed on vent and PCCM service requested to eval.   Wife reported pt not able to lie back on regular settings for 02/cpap x > 1 one week PTA assoc with acute on chronic leg swelling  History of present illness   54 y.o. male with medical history of diastolic CHF, atrial flutter, obstructive sleep apnea, hypertension, right bundle branch block presenting with shortness of breath for the last 2 to 3 days.    Apparently, the patient went to see his nephrologist earlier on 10/20/2020.  He was noted to have oxygen saturation in the 60s.  As result, he was sent to the emergency department for further evaluation.  Initially, the patient was awake and conversant with oxygen saturation in the low 70s.  He was placed on a nonrebreather after which the patient became obtunded.  VBG showed pH 7.1 66/97/106 on 80%.  Subsequently, the patient was placed on the ventilator with PRVC, PEEP of 5, FiO2 70%.  After approximately 10 to 15 minutes on the ventilator, the patient remained somnolent, but awakened and was able to say his name.  He did state that he has not been taking his torsemide as directed.  In addition, the patient has been sleeping in a recliner for an unclear duration of time secondary to shortness of breath.  At baseline, the patient has a size 4 noncuffed tracheostomy.  He is on supplemental oxygen 2.5 L during the day and 4 L at nighttime. There is no reports of nausea, vomiting, diarrhea, chest pain, abdominal pain. In the emergency  department, the patient was afebrile and hemodynamically stable with oxygen saturation 93-94% on the ventilator with FiO2 70%.  Chest x-ray showed left-sided pleural effusion with increased interstitial markings.  BNP 790.  PCT 0.12.  BMP showed a serum creatinine 2.43.  LFTs were unremarkable.  WBC 8.1, hemoglobin 14.5, platelets 253,000.  The patient was given ceftriaxone and azithromycin.  He was also given furosemide 80 mg IV x1.  COVID-19 RT-PCR negative   Past Medical History  CKD Proteinuria, Congestive heart failure   Essential hypertension  Gout Hypercholesteremia   Tracheostomy dependence    Significant Hospital Events    changed to PCV pm 12/17  Chest x-ray 12/17 left whiteout, resolved on chest x-ray 12/18 12/20 changed from propofol to Precedex 12/21 fever , bronch   Consults:  PCCM pm 12/15  Procedures:  New cuffed trach 12/15  Bronch bedside 12/21>> copious secretions  Significant Diagnostic Tests:   echo 12/16 ok LV There is the interventricular septum is flattened in  systole and diastole, consistent with right ventricular pressure and  volume overload.  2. Right ventricular systolic function is mildly reduced. The right  ventricular size is mildly enlarged. Tricuspid regurgitation signal is  inadequate for assessing PA pressure.  3. Right atrial size was upper normal/ RAP 39m     Micro Data:  Resp viral panel  12/15 neg MRSA  PCR  12/15 neg  BC x 2  12/15 >>> Urine 12/15 >>>  Neg  Trach 12/16 > neg Urine legionella 12/16 >>>neg Urine strep 12/16 >  neg  BC x 2  12/19 neg  Urine 12/19 neg  BAL 12/21 >> GNR >>nl flora   Antimicrobials:  zmax  12/15  only Rocephin 12/15  only Cefepime12/21 >> 12/27   Scheduled Meds: . acetylcysteine  2 mL Nebulization QID  . albuterol  2.5 mg Nebulization QID  . chlorhexidine gluconate (MEDLINE KIT)  15 mL Mouth Rinse BID  . Chlorhexidine Gluconate Cloth  6 each Topical Daily  . cloNIDine  0.1 mg Per Tube  TID  . feeding supplement (PROSource TF)  90 mL Per Tube TID  . free water  400 mL Per Tube Q4H  . mouth rinse  15 mL Mouth Rinse 2 times per day  . metoprolol tartrate  12.5 mg Per Tube BID  . pantoprazole sodium  40 mg Per Tube Q1200  . sodium chloride flush  3 mL Intravenous Q12H   Continuous Infusions: . sodium chloride    . sodium chloride 250 mL (11/02/20 1546)  . amiodarone    . dexmedetomidine (PRECEDEX) IV infusion 1.2 mcg/kg/hr (11/04/20 1327)  . feeding supplement (VITAL HIGH PROTEIN) 1,000 mL (11/01/20 1344)  . heparin 2,300 Units/hr (11/04/20 0732)   PRN Meds:.   Interim history/subjective:  Back on prvc/ issues again with apparent mucus plugs  Objective   Blood pressure 126/61, pulse 67, temperature 98.1 F (36.7 C), temperature source Oral, resp. rate 18, height 5' 7" (1.702 m), weight 130.5 kg, SpO2 96 %.    Vent Mode: PCV FiO2 (%):  [40 %-50 %] 45 % Set Rate:  [20 bmp] 20 bmp PEEP:  [5 cmH20] 5 cmH20 Pressure Support:  [11 cmH20] 11 cmH20 Plateau Pressure:  [14 cmH20-17 cmH20] 14 cmH20   Intake/Output Summary (Last 24 hours) at 11/04/2020 1334 Last data filed at 11/04/2020 0732 Gross per 24 hour  Intake 2400.77 ml  Output 2850 ml  Net -449.23 ml   Filed Weights   10/31/20 0546 11/01/20 0442 11/02/20 0500  Weight: 135.9 kg (!) 143.4 kg 130.5 kg    Examination: Tmax   99.4 Pt sedated on precedex p bad night with agitation/ sob/ ? Mucus plugs  Oropharynx clear,  mucosa nl Neck supple/ trach in place Lungs with  exp > insp rhonchi bilaterally RRR no s3 or or sign murmur Abd obese with limited excursion  Extr warm with trace UE/LE edema      Resolved Hospital Problem list     Assessment & Plan:   1) acute on chronic hypoxemic/hypercarbic resp falure prob OHS  with vol overload  CAP Left lung atelectasis -resolved, status post bronchoscopy 12/21 for secretions >> dyssynchronous on prvc when not sedated 12/17 > changed to PCV  - wean on PSV as  tol / PCV is rest mode -  recurrent mucus plugs> restart mucomyst  - resting 12/30 - try again to mobilize / wean 12/31     2)  AKI on CKD with vol overload on ACEi PTA Lab Results  Component Value Date   CREATININE 2.52 (H) 11/04/2020   CREATININE 2.83 (H) 11/03/2020   CREATININE 3.16 (H) 11/02/2020   -  holding  acei indefinitely -   Trending in the right direction now off diuretics with neg 500 cc last 24h   3) history of atrial flutter -  IV heparin / amiodarone drip    4) sedation needs -on Precedex + fent   -  Goal RASS 0 to -1 ->>> added low doses of clonidine with hopes of weaning back down/ off of precedex as not a good longterm option  5) severe OSA -last [facility] eval; 2019  progressed to tracheostomy#4  capped and using auto CPAP with goal of eventual decannulation, lost to follow-up since then - suspect heading toward ltac      Best practice (evaluated daily)   Diet: TFs Pain/Anxiety/Delirium protocol (if indicated): Precedex/as needed fentanyl VAP protocol (if indicated): Y  DVT prophylaxis: IV heparin  GI prophylaxis: Protonix Glucose control: per triad Mobility: start to mobilize to chair as tol  last date of multidisciplinary goals of care:  Per triad , wife and 2 brothers updated 12/21  Code Status: full  Disposition: ICU [facility]  Labs   CBC: Recent Labs  Lab 10/30/20 0444 10/31/20 0329 11/01/20 1145 11/03/20 0223 11/04/20 0519  WBC 9.6 8.5 7.3 6.1 5.9  HGB 12.7* 11.5* 11.9* 12.1* 11.1*  HCT 45.0 41.4 40.5 40.9 39.2  MCV 103.2* 104.8* 101.0* 100.2* 101.6*  PLT 189 169 172 177 161    Basic Metabolic Panel: Recent Labs  Lab 10/31/20 0329 11/01/20 0737 11/02/20 0440 11/03/20 0223 11/04/20 0519  NA 145 136 139 138 143  K 4.1 3.9 4.3 4.0 4.2  CL 102 98 101 103 105  CO2 _0 GLUCOSE 155* 136* 118* 126* 136*  BUN 106* 102* 104* 104* 103*  CREATININE 4.21* 3.43* 3.16* 2.83* 2.52*  CALCIUM 9.2 9.2 9.4 9.7 9.9  PHOS 4.2 4.6 5.1* 4.8*  5.0*   GFR: Estimated Creatinine Clearance: 43.6 mL/min (A) (by C-G formula based on SCr of 2.52 mg/dL (H)). Recent Labs  Lab 10/31/20 0329 11/01/20 1145 11/03/20 0223 11/04/20 0519  WBC 8.5 7.3 6.1 5.9    Liver Function Tests: Recent Labs  Lab 10/31/20 0329 11/01/20 0737 11/02/20 0440 11/03/20 0223 11/04/20 0519  ALBUMIN 2.3* 2.5* 2.6* 2.5* 2.3*   No results for input(s): LIPASE, AMYLASE in the last 168 hours. No results for input(s): AMMONIA in the last 168 hours.  ABG    Component Value Date/Time   PHART 7.242 (L) 10/20/2020 2355   PCO2ART 79.3 (HH) 10/20/2020 2355   PO2ART 80.0 (L) 10/20/2020 2355   HCO3 27.6 10/20/2020 2355   O2SAT 93.6 10/20/2020 2355     Coagulation Profile: No results for input(s): INR, PROTIME in the last 168 hours.  Cardiac Enzymes: No results for input(s): CKTOTAL, CKMB, CKMBINDEX, TROPONINI in the last 168 hours.  HbA1C: No results found for: HGBA1C  CBG: Recent Labs  Lab 11/03/20 2157 11/03/20 2359 11/04/20 0449 11/04/20 0754 11/04/20 1127  GLUCAP 117* 122* 131* 126* 106*     Christinia Gully, MD [facility] and [place] [phone]   After 7:00 pm call Elink  [phone]

## 2020-11-05 ENCOUNTER — Inpatient Hospital Stay (HOSPITAL_COMMUNITY): Payer: Medicare Other

## 2020-11-05 DIAGNOSIS — N179 Acute kidney failure, unspecified: Secondary | ICD-10-CM | POA: Diagnosis not present

## 2020-11-05 DIAGNOSIS — J9620 Acute and chronic respiratory failure, unspecified whether with hypoxia or hypercapnia: Secondary | ICD-10-CM | POA: Diagnosis not present

## 2020-11-05 DIAGNOSIS — J9601 Acute respiratory failure with hypoxia: Secondary | ICD-10-CM

## 2020-11-05 DIAGNOSIS — J9811 Atelectasis: Secondary | ICD-10-CM | POA: Diagnosis not present

## 2020-11-05 DIAGNOSIS — I5033 Acute on chronic diastolic (congestive) heart failure: Secondary | ICD-10-CM | POA: Diagnosis not present

## 2020-11-05 LAB — RENAL FUNCTION PANEL
Albumin: 2.5 g/dL — ABNORMAL LOW (ref 3.5–5.0)
Albumin: 2.3 g/dL — ABNORMAL LOW (ref 3.5–5.0)
Anion gap: 9 (ref 5–15)
Anion gap: 8 (ref 5–15)
BUN: 104 mg/dL — ABNORMAL HIGH (ref 6–20)
BUN: 98 mg/dL — ABNORMAL HIGH (ref 6–20)
CO2: 26 mmol/L (ref 22–32)
CO2: 28 mmol/L (ref 22–32)
Calcium: 9.7 mg/dL (ref 8.9–10.3)
Calcium: 10.1 mg/dL (ref 8.9–10.3)
Chloride: 103 mmol/L (ref 98–111)
Chloride: 105 mmol/L (ref 98–111)
Creatinine, Ser: 2.83 mg/dL — ABNORMAL HIGH (ref 0.61–1.24)
Creatinine, Ser: 2.24 mg/dL — ABNORMAL HIGH (ref 0.61–1.24)
GFR, Estimated: 26 mL/min — ABNORMAL LOW (ref >60)
GFR, Estimated: 34 mL/min — ABNORMAL LOW (ref >60)
Glucose, Bld: 126 mg/dL — ABNORMAL HIGH (ref 70–99)
Glucose, Bld: 129 mg/dL — ABNORMAL HIGH (ref 70–99)
Phosphorus: 4.8 mg/dL — ABNORMAL HIGH (ref 2.5–4.6)
Phosphorus: 4.9 mg/dL — ABNORMAL HIGH (ref 2.5–4.6)
Potassium: 4 mmol/L (ref 3.5–5.1)
Potassium: 4.2 mmol/L (ref 3.5–5.1)
Sodium: 138 mmol/L (ref 135–145)
Sodium: 141 mmol/L (ref 135–145)

## 2020-11-05 LAB — CBC
HCT: 39.1 % (ref 39.0–52.0)
Hemoglobin: 11.4 g/dL — ABNORMAL LOW (ref 13.0–17.0)
MCH: 29.5 pg (ref 26.0–34.0)
MCHC: 29.2 g/dL — ABNORMAL LOW (ref 30.0–36.0)
MCV: 101.3 fL — ABNORMAL HIGH (ref 80.0–100.0)
Platelets: 228 10*3/uL (ref 150–400)
RBC: 3.86 MIL/uL — ABNORMAL LOW (ref 4.22–5.81)
RDW: 14.2 % (ref 11.5–15.5)
WBC: 5.8 10*3/uL (ref 4.0–10.5)
nRBC: 0 % (ref 0.0–0.2)

## 2020-11-05 LAB — GLUCOSE, CAPILLARY
Glucose-Capillary: 108 mg/dL — ABNORMAL HIGH (ref 70–99)
Glucose-Capillary: 122 mg/dL — ABNORMAL HIGH (ref 70–99)
Glucose-Capillary: 119 mg/dL — ABNORMAL HIGH (ref 70–99)

## 2020-11-05 LAB — HEPARIN LEVEL (UNFRACTIONATED): Heparin Unfractionated: 0.34 IU/mL (ref 0.30–0.70)

## 2020-11-05 NOTE — Progress Notes (Signed)
Physical Therapy Treatment Patient Details Name: Jack Cox MRN: [id number] DOB: 1966/03/30 Today's Date: 11/05/2020    History of Present Illness Zacariah Belue is a 54 y.o. male with medical history of diastolic CHF, atrial flutter, obstructive sleep apnea, hypertension, right bundle branch block presenting with shortness of breath for the last 2 to 3 days.  Unfortunately, the patient is encephalopathic at the time of my evaluation; therefore, history is limited.  Apparently, the patient went to see his nephrologist earlier on 10/20/2020.  He was noted to have oxygen saturation in the 60s.  As result, he was sent to the emergency department for further evaluation.  Initially, the patient was awake and conversant with oxygen saturation in the low 70s.  He was placed on a nonrebreather after which the patient became obtunded.  VBG showed pH 7.1 66/97/106 on 80%.  Subsequently, the patient was placed on the ventilator with PRVC, PEEP of 5, FiO2 70%.  After approximately 10 to 15 minutes on the ventilator, the patient remained somnolent, but awakened and was able to tell me his name.  He did state that he has not been taking his torsemide as directed.  In addition, the patient has been sleeping in a recliner for an unclear duration of time secondary to shortness of breath.  At baseline, the patient has a size 4 noncuffed tracheostomy.  He is on supplemental oxygen 2.5 L during the day and 4 L at nighttime.  There is no reports of nausea, vomiting, diarrhea, chest pain, abdominal pain.  In the emergency department, the patient was afebrile and hemodynamically stable with oxygen saturation 93-94% on the ventilator with FiO2 70%.  Chest x-ray showed left-sided pleural effusion with increased interstitial markings.  BNP 790.  PCT 0.12.  BMP showed a serum creatinine 2.43.  LFTs were unremarkable.  WBC 8.1, hemoglobin 14.5, platelets 253,000.  The patient was given ceftriaxone and azithromycin.  He was also  given furosemide 80 mg IV x1.  COVID-19 RT-PCR negative    PT Comments    Patient demonstrates slow labored movement for sitting up at bedside with limited use of hands due to swelling and weakness, increased endurance/tolerance for sitting up at bedside with good return for completing BLE/ROM strengthening exercises with occasional verbal cueing.  Patient attempt to stand using RW, but unable mostly due to BUE weakness.  Patient required Mod/max assist for sit to supine and repositioning self in bed with bed in head down position.  Patient will benefit from continued physical therapy in hospital and recommended venue below to increase strength, balance, endurance for safe ADLs and gait.    Follow Up Recommendations  SNF     Equipment Recommendations  None recommended by PT    Recommendations for Other Services       Precautions / Restrictions Precautions Precautions: Fall Restrictions Weight Bearing Restrictions: No    Mobility  Bed Mobility Overal bed mobility: Needs Assistance Bed Mobility: Sit to Supine;Supine to Sit     Supine to sit: Mod assist Sit to supine: Mod assist;Max assist   General bed mobility comments: increased time, labored movement  Transfers                    Ambulation/Gait                 Stairs             Wheelchair Mobility    Modified Rankin (Stroke Patients Only)  Balance Overall balance assessment: Needs assistance Sitting-balance support: Feet unsupported;No upper extremity supported Sitting balance-Leahy Scale: Fair Sitting balance - Comments: fair/good seated at EOB                                    Cognition Arousal/Alertness: Awake/alert Behavior During Therapy: WFL for tasks assessed/performed Overall Cognitive Status: Within Functional Limits for tasks assessed                                        Exercises General Exercises - Lower Extremity Long Arc Quad:  Seated;AROM;Strengthening;Both;20 reps Hip Flexion/Marching: Seated;AROM;Strengthening;Both;20 reps Toe Raises: Seated;AROM;Strengthening;Both;20 reps Heel Raises: Seated;AROM;Strengthening;Both;20 reps    General Comments        Pertinent Vitals/Pain Pain Assessment: Faces Faces Pain Scale: Hurts a little bit Pain Location: hands Pain Descriptors / Indicators: Guarding;Sore Pain Intervention(s): Limited activity within patient's tolerance;Monitored during session;Repositioned    Home Living                      Prior Function            PT Goals (current goals can now be found in the care plan section) Acute Rehab PT Goals Patient Stated Goal: return home after rehab PT Goal Formulation: With patient Time For Goal Achievement: 11/17/20 Potential to Achieve Goals: Good Progress towards PT goals: Progressing toward goals    Frequency    Min 3X/week      PT Plan      Co-evaluation              AM-PAC PT "6 Clicks" Mobility   Outcome Measure  Help needed turning from your back to your side while in a flat bed without using bedrails?: A Lot Help needed moving from lying on your back to sitting on the side of a flat bed without using bedrails?: A Lot Help needed moving to and from a bed to a chair (including a wheelchair)?: Total Help needed standing up from a chair using your arms (e.g., wheelchair or bedside chair)?: Total Help needed to walk in hospital room?: Total Help needed climbing 3-5 steps with a railing? : Total 6 Click Score: 8    End of Session Equipment Utilized During Treatment: Oxygen Activity Tolerance: Patient tolerated treatment well;Patient limited by fatigue Patient left: in bed;with call bell/phone within reach Nurse Communication: Mobility status PT Visit Diagnosis: Unsteadiness on feet (R26.81);Other abnormalities of gait and mobility (R26.89);Muscle weakness (generalized) (M62.81)     Time: 2671-2458 PT Time Calculation  (min) (ACUTE ONLY): 27 min  Charges:  $Therapeutic Exercise: 8-22 mins $Therapeutic Activity: 8-22 mins                     3:05 PM, 11/05/20 Lonell Grandchild, MPT Physical Therapist with [facility] [phone] office [phone] mobile phone

## 2020-11-05 NOTE — Progress Notes (Signed)
PROGRESS NOTE  Jack Cox RKY:[id number] DOB: 1965-11-28 DOA: 10/20/2020 PCP: [name], MD Brief History: 54 y.o.malewith medical history ofdiastolic CHF, atrial flutter, obstructive sleep apnea, hypertension, right bundle branch block presenting with shortness of breath for the last 2 to 3 days. Unfortunately, the patient is encephalopathic at the time of my evaluation; therefore, history is limited. Apparently, the patient went to see his nephrologist earlier on 10/20/2020. He was noted to have oxygen saturation in the 60s. As result, he was sent to the emergency department for further evaluation. Initially, the patient was awake and conversant with oxygen saturation in the low 70s. He was placed on a nonrebreather after which the patient became obtunded. VBG showed pH 7.1 66/97/106 on 80%. Subsequently, the patient was placed on the ventilator with PRVC,PEEP of 5, FiO2 70%. After approximately 10 to 15 minutes on the ventilator, the patient remained somnolent, but awakened and was able to tell me his name. He did state that he has not been taking his torsemide as directed. In addition, the patient has been sleeping in a recliner for an unclear duration of time secondary to shortness of breath. At baseline, the patient has a size 4 noncuffed tracheostomy. He is on supplemental oxygen 2.5 L during the day and 4 L at nighttime. There is no reports of nausea, vomiting, diarrhea, chest pain, abdominal pain. In the emergency department, the patient was afebrile and hemodynamically stable with oxygen saturation 93-94% on the ventilator with FiO2 70%. Chest x-ray showed left-sided pleural effusion with increased interstitial markings. BNP 790. PCT 0.12. BMP showed a serum creatinine 2.43. LFTs were unremarkable. WBC 8.1, hemoglobin 14.5, platelets 253,000. The patient was given ceftriaxone and azithromycin. He was also given furosemide 80 mg IV x1. COVID-19 RT-PCR  negative. He was continued on IV lasix 80 mg bid with uptrending of his serum creatinine. Renal was consulted to assist.Pulmonary was also consulted to assist also.  Assessment/Plan: Acute on chronic respiratory failure with hypoxia and hypercarbia -Secondary to CHF in the setting of OSA -remainson ventilator with trachwith trach collar trials during day -pulmonary consulted to help with vent management -dyssynchronous on prvc when not sedated 12/17 > changed to PCV -11/02/20 personally reviewed CXR--increased interstitial markings--bilateral patchy opacities -PCT 0.12>>0.61 -having mucus plugging--needs aggressive suctioning -12/17 and 12/26 CXR--white out of left lung likely mucus plugging -Mucomyst 12/21>>12/27, restart 12/30 -10/26/20-bronchoscopy--Thick inspissated mucus in proximal airways & BL lower lobes, removed with suctioning - hypertonic saline nebulizer treatments started by pulmonology 12/21>>12/27 -trach collar trials 12/28>> -mucomyst d/ced 12/27, restarted on 12/30 due to mucus plugging -continue albuterol nebs -wean with PSV and PCV rest mode  Acute on chronic diastolic CHF/Cor Pulmonale -06/25/2019 echo EF 55%, trivial TR -12/16/21echo--EF 60-65%, no WMA, RV pressure overload, mild TR -he has been diuresing well with no diuretic support -Following BMP -Accurate I's and O's--NEG 17.6L -Daily weights  Atrial flutter- controlled -apixabantemporarilyon hold, he is on an IV heparin infusionfor full anticoagulation -rate controlledbut had an episode 3-4 hours 12/18-12/19 evening -resumed low dose metoprolol 12.5 mg BID per tube   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  -Monitor with diuresis -consult nephrology--appreciated-->albumin+lasix bid per renal -last dose of lasix was 10/25/20 -last dose albumin 12/26  FEN -had mild hypoglycemia -NG  placed 12/17 -switchedto Vital High Protein_0 /hr-->hypoglycemia improved  Hypernatremia- resolved now after adding more free water to  feeding tuberegimen.   Fever - improving with pneumonia management -101.0 fever evening 12/18 and 100.7 12/21 -blood cultures x 2--neg to date -UA and urine culture--no significant pyuria -12/16 trach aspirate-->flora -10/26/20-->BAL sent for culture -completed course of cefepime12/27 -resolved  Essential hypertension -overall stable -metoprolol low dose with holding parameters -clonidine initiated by pulm  OSA -The patient has a tracheostomy placed in 2001 -He has a size 4 cuffless trach  Right bundle branch block -This has been chronic with review of the medical records  Morbid obesity -BMI 52.94 -lifestyle modification  GOC -palliative following -currently full scope of care  Status is: Inpatient  Remains inpatient appropriate because:IV treatments appropriate due to intensity of illness or inability to take PO  Dispo: The patient is from:Home Anticipated d/c is RW:ERXV Anticipated d/c date is: >3 days Patient currently is not medically stable to d/c.   Family Communication:brotherupdated bedside 12/20-21  Consultants:pulm, renal  Code Status: FULL  DVT Prophylaxis: IV heparin infusion for full anticoagulation   Procedures: As Listed in Progress Note Above  Antibiotics: Cefepime 12/21>>12/27        Subjective:  patient denies f/c, cp, n/v/d.  Complains of increased trach secretions.  No abd pain  Objective: Vitals:   11/05/20 1400 11/05/20 1459 11/05/20 1500 11/05/20 1600  BP: (!) 110/40  103/61 (!) 96/48  Pulse: 75  66 65  Resp: 16  (!) 0 18  Temp:      TempSrc:      SpO2: 92% 95% 91% 93%  Weight:      Height:        Intake/Output Summary (Last 24 hours) at 11/05/2020 1700 Last data filed at 11/05/2020 0546 Gross per 24  hour  Intake --  Output 2300 ml  Net -2300 ml   Weight change:  Exam:   General:  Pt is alert, follows commands appropriately, not in acute distress  HEENT: No icterus, No thrush, No neck mass, Cut Off/AT  Cardiovascular: RRR, S1/S2, no rubs, no gallops  Respiratory: bilateral scattered rales  Abdomen: Soft/+BS, non tender, non distended, no guarding  Extremities:  LE edema, No lymphangitis, No petechiae, No rashes, no synovitis   Data Reviewed: I have personally reviewed following labs and imaging studies Basic Metabolic Panel: Recent Labs  Lab 11/01/20 0737 11/02/20 0440 11/03/20 0223 11/04/20 0519 11/05/20 0616  NA 136 139 138 143 141  K 3.9 4.3 4.0 4.2 4.2  CL 98 101 103 105 105  CO2 _0 GLUCOSE 136* 118* 126* 136* 129*  BUN 102* 104* 104* 103* 98*  CREATININE 3.43* 3.16* 2.83* 2.52* 2.24*  CALCIUM 9.2 9.4 9.7 9.9 10.1  PHOS 4.6 5.1* 4.8* 5.0* 4.9*   Liver Function Tests: Recent Labs  Lab 11/01/20 0737 11/02/20 0440 11/03/20 0223 11/04/20 0519 11/05/20 0616  ALBUMIN 2.5* 2.6* 2.5* 2.3* 2.3*   No results for input(s): LIPASE, AMYLASE in the last 168 hours. No results for input(s): AMMONIA in the last 168 hours. Coagulation Profile: No results for input(s): INR, PROTIME in the last 168 hours. CBC: Recent Labs  Lab 10/31/20 0329 11/01/20 1145 11/03/20 0223 11/04/20 0519 11/05/20 0616  WBC 8.5 7.3 6.1 5.9 5.8  HGB 11.5* 11.9* 12.1* 11.1* 11.4*  HCT 41.4 40.5 40.9 39.2 39.1  MCV 104.8* 101.0* 100.2* 101.6* 101.3*  PLT 169 172 177 199 228   Cardiac Enzymes: No results for input(s): CKTOTAL, CKMB, CKMBINDEX, TROPONINI in the last 168 hours. BNP: Invalid input(s): POCBNP CBG: Recent Labs  Lab 11/04/20 1557 11/04/20 1938 11/04/20 2321 11/05/20 0853 11/05/20 1150  GLUCAP 122* 131* 129* 108* 122*   HbA1C: No results for input(s): HGBA1C in the last 72 hours. Urine analysis:    Component Value Date/Time   COLORURINE YELLOW  10/28/2020 0429   APPEARANCEUR HAZY (A) 10/28/2020 0429   LABSPEC 1.014 10/28/2020 0429   PHURINE 5.0 10/28/2020 0429   GLUCOSEU NEGATIVE 10/28/2020 0429   HGBUR SMALL (A) 10/28/2020 0429   BILIRUBINUR NEGATIVE 10/28/2020 0429   KETONESUR NEGATIVE 10/28/2020 0429   PROTEINUR 100 (A) 10/28/2020 0429   NITRITE NEGATIVE 10/28/2020 0429   LEUKOCYTESUR NEGATIVE 10/28/2020 0429   Sepsis Labs: _0 (procalcitonin:4,lacticidven:4) ) Recent Results (from the past 240 hour(s))  Culture, respiratory (non-expectorated)     Status: None   Collection Time: 10/26/20  6:07 PM   Specimen: Tracheal Aspirate; Respiratory  Result Value Ref Range Status   Specimen Description   Final    TRACHEAL ASPIRATE Performed at [facility], [address] [place], [place] [address]    Special Requests   Final    Normal Performed at [facility], [address], [place], [place] [address]    Gram Stain   Final    FEW WBC PRESENT, PREDOMINANTLY PMN MODERATE GRAM POSITIVE RODS FEW GRAM POSITIVE COCCI FEW GRAM NEGATIVE RODS    Culture   Final    FEW Normal respiratory flora-no Staph aureus or Pseudomonas seen Performed at [facility], [place] [address], [place], [place] [address]    Report Status 10/29/2020 FINAL  Final     Scheduled Meds: . acetylcysteine  1 mL Nebulization QID  . albuterol  2.5 mg Nebulization QID  . chlorhexidine gluconate (MEDLINE KIT)  15 mL Mouth Rinse BID  . Chlorhexidine Gluconate Cloth  6 each Topical Daily  . cloNIDine  0.1 mg Per Tube TID  . feeding supplement (PROSource TF)  90 mL Per Tube TID  . free water  400 mL Per Tube Q4H  . mouth rinse  15 mL Mouth Rinse 2 times per day  . metoprolol tartrate  12.5 mg Per Tube BID  . pantoprazole sodium  40 mg Per Tube Q1200  . sodium chloride flush  3 mL Intravenous Q12H   Continuous Infusions: . sodium chloride    . sodium chloride 250 mL (11/02/20 1546)  . amiodarone    . dexmedetomidine (PRECEDEX) IV infusion 1.2  mcg/kg/hr (11/05/20 0546)  . feeding supplement (VITAL HIGH PROTEIN) 1,000 mL (11/04/20 1600)  . heparin 2,300 Units/hr (11/04/20 2325)    Procedures/Studies: US RENAL  Result Date: 10/28/2020 CLINICAL DATA:  Chronic kidney disease.  Acute kidney injury. EXAM: RENAL / URINARY TRACT ULTRASOUND COMPLETE COMPARISON:  CT 06/12/2018 FINDINGS: Right Kidney: Renal measurements: 11.0 x 6.4 x 5.2 cm = volume: 190 mL. Increased echogenicity. No focal lesion or hydronephrosis. Left Kidney: Renal measurements: 11.2 x 7.6 x 6.2 cm = volume: 276 mL. Increased echogenicity. No focal lesion or hydronephrosis. Bladder: Foley catheter within the bladder. Other: None. IMPRESSION: Slightly enlarged kidneys showing increased echogenicity. Findings could be due to chronic diabetic glomerulonephropathy or acute nephritis. No obstruction or focal lesion. Electronically Signed   By: Nelson Chimes M.D.   On: 10/28/2020 14:30   DG Chest Port 1 View  Result Date: 11/05/2020 CLINICAL DATA:  Respiratory failure EXAM: PORTABLE CHEST 1 VIEW COMPARISON:  11/02/2020 FINDINGS: Tracheostomy and NG tube are unchanged. Cardiomegaly. Mild vascular congestion. Bilateral airspace opacities again noted, worse at the left base. This  is stable since prior study. No visible effusions or pneumothorax. IMPRESSION: Cardiomegaly. Bilateral airspace disease, most pronounced in the left base, stable since prior study. Electronically Signed   By: Rolm Baptise M.D.   On: 11/05/2020 08:03   DG CHEST PORT 1 VIEW  Result Date: 11/02/2020 CLINICAL DATA:  NG tube placement. EXAM: PORTABLE CHEST 1 VIEW COMPARISON:  Radiograph yesterday. FINDINGS: The tip of the enteric tube is below the diaphragm, not included in the field of view. The side-port is not well visualized but likely below the diaphragm. Stable cardiomegaly. Patchy airspace opacities in both lung bases, unchanged. IMPRESSION: 1. Tip of the enteric tube below the diaphragm, not included in the  field of view. 2. Unchanged patchy bibasilar airspace opacities. Electronically Signed   By: Keith Rake M.D.   On: 11/02/2020 21:51   DG CHEST PORT 1 VIEW  Result Date: 11/01/2020 CLINICAL DATA:  Acute on chronic respiratory failure. EXAM: PORTABLE CHEST 1 VIEW COMPARISON:  10/31/2020 FINDINGS: The tracheostomy tube is stable. The NG tube is stable. Much improved left lung aeration when compared to the prior study. The left upper lobe is now aerated. Persistent airspace process in the right lung and at the left lung base. No pleural effusions or pneumothorax. IMPRESSION: 1. Much improved left lung aeration when compared to the prior study. 2. Persistent right lung and left basilar airspace process. Electronically Signed   By: Marijo Sanes M.D.   On: 11/01/2020 05:55   DG CHEST PORT 1 VIEW  Result Date: 10/31/2020 CLINICAL DATA:  Acute on chronic respiratory EXAM: PORTABLE CHEST 1 VIEW COMPARISON:  Failure four days ago FINDINGS: Enteric tube which reaches the stomach. Tracheostomy tube in place. Interval white out of the left chest with volume loss. Progressive hazy opacification of the right lower chest. Heart size is largely obscured. No visible air leak. IMPRESSION: 1. Interval white out of the left chest, likely plugging and pulmonary collapse. 2. Progressive airspace disease or pleural effusion on the right. Electronically Signed   By: Monte Fantasia M.D.   On: 10/31/2020 04:22   DG Chest Port 1 View  Result Date: 10/27/2020 CLINICAL DATA:  Acute respiratory failure EXAM: PORTABLE CHEST 1 VIEW COMPARISON:  Yesterday FINDINGS: Enteric and tracheostomy tubes remain in good position. Cardiomegaly. Interstitial opacity which could be bronchitic or congestive. There is improved aeration at the right base. Negative for pneumothorax. IMPRESSION: 1. Improved aeration. 2. Bronchitic or congestive interstitial opacity. Electronically Signed   By: Monte Fantasia M.D.   On: 10/27/2020 07:46   DG  Chest Port 1 View  Result Date: 10/26/2020 CLINICAL DATA:  Shortness of breath for 2-3 days EXAM: PORTABLE CHEST 1 VIEW COMPARISON:  Three days ago FINDINGS: Progressive hazy appearance of the right mid to lower chest. Dense retrocardiac opacity persists. An enteric tube and tracheostomy tube remain in place. Cardiomegaly and vascular pedicle widening. IMPRESSION: Pleural fluid/pulmonary opacification at the bases which has progressed from 3 days ago. Electronically Signed   By: Monte Fantasia M.D.   On: 10/26/2020 06:02   DG CHEST PORT 1 VIEW  Result Date: 10/23/2020 CLINICAL DATA:  Shortness of breath. EXAM: PORTABLE CHEST 1 VIEW COMPARISON:  October 22, 2020 FINDINGS: The near complete opacification of the left hemithorax seen on the comparison chest x-ray has resolved. Persistent bibasilar opacities are likely atelectasis. No pneumothorax. A tracheostomy tube is in good position. An NG tube terminates in the stomach. No other acute abnormalities. IMPRESSION: 1. Support apparatus as above. 2.  Bibasilar atelectasis and a possible small left effusion. Improved aeration of the left lung. Electronically Signed   By: Dorise Bullion III M.D   On: 10/23/2020 14:57   DG Chest Port 1 View  Result Date: 10/22/2020 CLINICAL DATA:  Acute respiratory failure. EXAM: PORTABLE CHEST 1 VIEW COMPARISON:  10/20/2020 FINDINGS: Tracheostomy tube overlies the airway. The cardiac silhouette is enlarged. There is persistent mild central pulmonary vascular congestion without overt edema. Airspace opacities in the left greater than right mid and lower lungs have slightly improved. Small pleural effusions are questioned. No pneumothorax is identified. IMPRESSION: Slight improvement of bilateral airspace opacities which could reflect pneumonia or atelectasis. Electronically Signed   By: [name] M.D.   On: 10/22/2020 09:16   DG Chest Port 1 View  Result Date: 10/20/2020 CLINICAL DATA:  Questionable sepsis EXAM:  PORTABLE CHEST 1 VIEW COMPARISON:  Chest x-ray 06/12/2018 report without images. FINDINGS: Tracheostomy tube terminates approximately 7.5 cm above the carina. The heart size and mediastinal contours are not well visualized. Bilateral hilar vasculature prominence. Aortic arch calcifications. Silhouetting off of the left hemidiaphragm and left cardiac border. Partial silhouetting off the right cardiac border and right hemidiaphragm. Increased interstitial markings. Likely at least trace to small volume left pleural effusion. No pneumothorax. No acute osseous abnormality. IMPRESSION: 1. Left lung opacity and likely at least trace to small volume left pleural effusion with silhouetting off of the left diaphragm and heart border. Concern for infection/inflammation. 2. Right lower lobe and middle lobe airspace opacity could represent a combination of atelectasis, infection, inflammation. 3. At least mild pulmonary edema. 4. Consider chest x-ray PA and lateral view for further evaluation. Electronically Signed   By: Iven Finn M.D.   On: 10/20/2020 12:26   DG Chest Port 1V same Day  Result Date: 10/22/2020 CLINICAL DATA:  Status post NG tube placement. EXAM: PORTABLE CHEST 1 VIEW COMPARISON:  Single-view of the chest earlier today. FINDINGS: The upper chest is off the margin of the film. NG tube is looped in the upper trachea or esophagus. Visualized left chest is completely whited out on this study. IMPRESSION: NG tube is looped in the upper esophagus or trachea. Complete whiteout of the left chest is new since the study earlier today could be due to mucous plugging and left lung collapse or rapidly developing pleural effusion and airspace disease. Electronically Signed   By: [name] M.D.   On: 10/22/2020 15:16   DG Abd Portable 1V  Result Date: 10/22/2020 CLINICAL DATA:  Nasogastric tube placement EXAM: PORTABLE ABDOMEN - 1 VIEW COMPARISON:  Portable exam 1613 hours compared to 1523 hours  FINDINGS: Tip of nasogastric tube projects over distal gastric antrum, consider withdrawal 5 cm. Bowel gas pattern normal. Opacification of the inferior LEFT hemithorax again seen. IMPRESSION: Tip of nasogastric tube projects over distal gastric antrum. Consider withdrawal of nasogastric tube 5 cm. Electronically Signed   By: [name] M.D.   On: 10/22/2020 16:36   DG Abd Portable 1V  Result Date: 10/22/2020 CLINICAL DATA:  Nasogastric tube placement EXAM: PORTABLE ABDOMEN - 1 VIEW COMPARISON:  None. FINDINGS: Nasogastric tube extends into the gastric cardia where it loops upon itself with the tip at the junction of the mid and distal thirds of the esophagus. There is no bowel dilatation or air-fluid level to suggest bowel obstruction. No free air. There is consolidation left lower lobe. IMPRESSION: Nasogastric tube extends into the stomach where it loops upon itself with the tip at  the junction of the mid and distal thirds of the esophagus. No bowel obstruction or free air evident. Consolidation left lower lobe. These results will be called to the ordering clinician or representative by the Radiologist Assistant, and communication documented in the PACS or [employer]. Electronically Signed   By: Lowella Grip III M.D.   On: 10/22/2020 15:34   ECHOCARDIOGRAM COMPLETE  Result Date: 10/21/2020    ECHOCARDIOGRAM REPORT   Patient Name:   UZOMA VIVONA Date of Exam: 10/21/2020 Medical Rec #:  [id number]        Height:       68.0 in Accession #:    [id number]       Weight:       325.8 lb Date of Birth:  May 21, 1966        BSA:          2.515 m Patient Age:    15 years         BP:           112/61 mmHg Patient Gender: M                HR:           51 bpm. Exam Location:  [facility] Procedure: 2D Echo Indications:    CHF-Acute Diastolic K93.81  History:        Patient has no prior history of Echocardiogram examinations.                 CHF; Risk Factors:Non-Smoker. Acute on chronic respiratory                  failure with hypoxia.  Sonographer:    Leavy Cella RDCS (AE) Referring Phys: [phone] Michalla Ringer IMPRESSIONS  1. Left ventricular ejection fraction, by estimation, is 60 to 65%. The left ventricle has normal function. The left ventricle has no regional wall motion abnormalities. There is severe left ventricular hypertrophy. Left ventricular diastolic parameters  are indeterminate. There is the interventricular septum is flattened in systole and diastole, consistent with right ventricular pressure and volume overload.  2. Right ventricular systolic function is mildly reduced. The right ventricular size is mildly enlarged. Tricuspid regurgitation signal is inadequate for assessing PA pressure.  3. Right atrial size was upper normal.  4. The pericardial effusion is posterior to the left ventricle.  5. The mitral valve is grossly normal. Trivial mitral valve regurgitation.  6. The aortic valve is tricuspid. Aortic valve regurgitation is not visualized.  7. The inferior vena cava is normal in size with greater than 50% respiratory variability, suggesting right atrial pressure of 3 mmHg. FINDINGS  Left Ventricle: Left ventricular ejection fraction, by estimation, is 60 to 65%. The left ventricle has normal function. The left ventricle has no regional wall motion abnormalities. The left ventricular internal cavity size was normal in size. There is  severe left ventricular hypertrophy. The interventricular septum is flattened in systole and diastole, consistent with right ventricular pressure and volume overload. Left ventricular diastolic parameters are indeterminate. Right Ventricle: The right ventricular size is mildly enlarged. No increase in right ventricular wall thickness. Right ventricular systolic function is mildly reduced. Tricuspid regurgitation signal is inadequate for assessing PA pressure. Left Atrium: Left atrial size was normal in size. Right Atrium: Right atrial size was upper normal. Pericardium:  Trivial pericardial effusion is present. The pericardial effusion is posterior to the left ventricle. Mitral Valve: The mitral valve is grossly normal. Trivial mitral valve regurgitation. Tricuspid Valve:  The tricuspid valve is grossly normal. Tricuspid valve regurgitation is mild. Aortic Valve: The aortic valve is tricuspid. There is moderate aortic valve annular calcification. Aortic valve regurgitation is not visualized. Pulmonic Valve: The pulmonic valve was grossly normal. Pulmonic valve regurgitation is trivial. Aorta: The aortic root is normal in size and structure. Venous: The inferior vena cava is normal in size with greater than 50% respiratory variability, suggesting right atrial pressure of 3 mmHg. IAS/Shunts: No atrial level shunt detected by color flow Doppler.  LEFT VENTRICLE PLAX 2D LVIDd:         4.66 cm  Diastology LVIDs:         2.03 cm  LV e' medial:    5.87 cm/s LV PW:         1.76 cm  LV E/e' medial:  14.0 LV IVS:        1.73 cm  LV e' lateral:   8.49 cm/s LVOT diam:     1.90 cm  LV E/e' lateral: 9.7 LVOT Area:     2.84 cm  RIGHT VENTRICLE RV S prime:     19.40 cm/s TAPSE (M-mode): 1.9 cm LEFT ATRIUM             Index       RIGHT ATRIUM           Index LA diam:        3.50 cm 1.39 cm/m  RA Area:     24.60 cm LA Vol (A2C):   43.9 ml 17.46 ml/m RA Volume:   82.30 ml  32.73 ml/m LA Vol (A4C):   43.8 ml 17.42 ml/m LA Biplane Vol: 44.4 ml 17.66 ml/m   AORTA Ao Root diam: 3.10 cm MITRAL VALVE MV Area (PHT): 2.64 cm    SHUNTS MV Decel Time: 287 msec    Systemic Diam: 1.90 cm MV E velocity: 82.30 cm/s MV A velocity: 59.10 cm/s MV E/A ratio:  1.39 Rozann Lesches MD Electronically signed by Rozann Lesches MD Signature Date/Time: 10/21/2020/4:33:43 PM    Final     [name], DO  [facility]  If 7PM-7AM, please contact night-coverage [email or web address] Password [email or web address] 11/05/2020, 5:00 PM   LOS: 16 days

## 2020-11-05 NOTE — TOC Progression Note (Signed)
Transition of Care [facility]) - Progression Note    Patient Details  Name: Jack Cox MRN: [id number] Date of Birth: Oct 15, 1966  [facility]) CM/SW Contact  Natasha Bence, LCSW Phone Number: 11/05/2020, 4:46 PM  Clinical Narrative:    Per MD patient's ins request patient be given PEG tube and for pulmonary to attempt to wean patient's O2 needs down. Auth will need to be resubmitted after PEG tube. TOC to follow.    Expected Discharge Plan: Home/Self Care Barriers to Discharge: Continued Medical Work up  Expected Discharge Plan and Services Expected Discharge Plan: Home/Self Care In-house Referral: Clinical Social Work Discharge Planning Services: NA   Living arrangements for the past 2 months: Single Family Home                 DME Arranged: N/A DME Agency: NA       HH Arranged: NA HH Agency: NA         Social Determinants of Health (SDOH) Interventions    Readmission Risk Interventions No flowsheet data found.

## 2020-11-05 NOTE — Progress Notes (Addendum)
NAME:  Jack Cox, MRN:  [id number], DOB:  [birth year], LOS: 36 ADMISSION DATE:  10/20/2020, CONSULTATION DATE:  10/20/20  REFERRING MD:  Tat, Triad  CHIEF COMPLAINT:  Resp distress   Brief History   54 yobm  never smoker with MO complicated by severe OSA req trach 2001 last eval by [facility] 2018 also followed by ENT admit with increasing sob x 48 h pta s fever /purulent sputum and found to have severe hypercabic hypoxemic resp failure on arrival to ER am 12/15 so placed on vent and PCCM service requested to eval.   Wife reported pt not able to lie back on regular settings for 02/cpap x >  one week PTA assoc with acute on chronic leg swelling  History of present illness   54 y.o. male with medical history of diastolic CHF, atrial flutter, obstructive sleep apnea, hypertension, right bundle branch block presenting with shortness of breath for the last 2 to 3 days.    Apparently, the patient went to see his nephrologist earlier on 10/20/2020.  He was noted to have oxygen saturation in the 60s.  As result, he was sent to the emergency department for further evaluation.  Initially, the patient was awake and conversant with oxygen saturation in the low 70s.  He was placed on a nonrebreather after which the patient became obtunded.  VBG showed pH 7.1 66/97/106 on 80%.  Subsequently, the patient was placed on the ventilator with PRVC, PEEP of 5, FiO2 70%.  After approximately 10 to 15 minutes on the ventilator, the patient remained somnolent, but awakened and was able to say his name.  He did state that he has not been taking his torsemide as directed.  In addition, the patient has been sleeping in a recliner for an unclear duration of time secondary to shortness of breath.  At baseline, the patient has a size 4 noncuffed tracheostomy.  He is on supplemental oxygen 2.5 L during the day and 4 L at nighttime. There is no reports of nausea, vomiting, diarrhea, chest pain, abdominal pain. In the emergency  department, the patient was afebrile and hemodynamically stable with oxygen saturation 93-94% on the ventilator with FiO2 70%.  Chest x-ray showed left-sided pleural effusion with increased interstitial markings.  BNP 790.  PCT 0.12.  BMP showed a serum creatinine 2.43.  LFTs were unremarkable.  WBC 8.1, hemoglobin 14.5, platelets 253,000.  The patient was given ceftriaxone and azithromycin.  He was also given furosemide 80 mg IV x1.  COVID-19 RT-PCR negative   Past Medical History  Morbid obesity CKD Proteinuria, Congestive heart failure   Essential hypertension  Gout Hypercholesteremia   Tracheostomy dependence    Significant Hospital Events    changed to PCV pm 12/17  Chest x-ray 12/17 left whiteout, resolved on chest x-ray 12/18 12/20 changed from propofol to Precedex 12/21 fever , bronch 12/30 added back mucomyst for plugs   Consults:  PCCM pm 12/15  Procedures:  New cuffed trach 12/15  Bronch bedside 12/21>> copious secretions  Significant Diagnostic Tests:   echo 12/16 ok LV There is the interventricular septum is flattened in  systole and diastole, consistent with right ventricular pressure and  volume overload.  2. Right ventricular systolic function is mildly reduced. The right  ventricular size is mildly enlarged. Tricuspid regurgitation signal is  inadequate for assessing PA pressure.  3. Right atrial size was upper normal/ RAP 8m     Micro Data:  Resp viral panel  12/15 neg MRSA  PCR  12/15 neg  BC x 2 12/15 >>> Urine 12/15 >>>  Neg  Trach 12/16 > neg Urine legionella 12/16 >>>neg Urine strep 12/16 >  neg  BC x 2  12/19 neg  Urine 12/19 neg  BAL 12/21 >> GNR >>nl flora Trach 12/31 >>>   Antimicrobials:  zmax  12/15  only Rocephin 12/15  only Cefepime12/21 >> 12/27   Scheduled Meds: . acetylcysteine  1 mL Nebulization QID  . albuterol  2.5 mg Nebulization QID  . chlorhexidine gluconate (MEDLINE KIT)  15 mL Mouth Rinse BID  . Chlorhexidine  Gluconate Cloth  6 each Topical Daily  . cloNIDine  0.1 mg Per Tube TID  . feeding supplement (PROSource TF)  90 mL Per Tube TID  . free water  400 mL Per Tube Q4H  . mouth rinse  15 mL Mouth Rinse 2 times per day  . metoprolol tartrate  12.5 mg Per Tube BID  . pantoprazole sodium  40 mg Per Tube Q1200  . sodium chloride flush  3 mL Intravenous Q12H   Continuous Infusions: . sodium chloride    . sodium chloride 250 mL (11/02/20 1546)  . amiodarone    . dexmedetomidine (PRECEDEX) IV infusion 1.2 mcg/kg/hr (11/05/20 0546)  . feeding supplement (VITAL HIGH PROTEIN) 1,000 mL (11/04/20 1600)  . heparin 2,300 Units/hr (11/04/20 2325)   PRN Meds:.   Interim history/subjective:  Resting on PCV/ sedated on precedex  Objective   Blood pressure (!) 107/53, pulse 65, temperature 99.1 F (37.3 C), temperature source Axillary, resp. rate 12, height _0  (1.702 m), weight (!) 139.3 kg, SpO2 95 %.    Vent Mode: PCV FiO2 (%):  [45 %] 45 % Set Rate:  [20 bmp] 20 bmp PEEP:  [5 cmH20] 5 cmH20 Pressure Support:  [11 cmH20] 11 cmH20 Plateau Pressure:  [14 cmH20-17 cmH20] 16 cmH20   Intake/Output Summary (Last 24 hours) at 11/05/2020 1020 Last data filed at 11/05/2020 0546 Gross per 24 hour  Intake --  Output 3250 ml  Net -3250 ml   Filed Weights   11/01/20 0442 11/02/20 0500 11/05/20 0500  Weight: (!) 143.4 kg 130.5 kg (!) 139.3 kg    Examination: Tmax   100 (slt trend up) Pt asedated on vent No jvd Oropharynx clear,  mucosa nl Neck supple/ trach in place Lungs with distant scattered exp > insp rhonchi bilaterally RRR no s3 or or sign murmur Abd obese with very limited excursion  Extr warm with no edema or clubbing noted Neuro  Sensorium sedated ,  no apparent motor deficits on wua     I personally reviewed images and agree with radiology impression as follows:  CXR:  Portable  12/31 Cardiomegaly. Bilateral airspace disease, most pronounced in the left base, stable since  prior study.    Resolved Hospital Problem list     Assessment & Plan:   1) acute on chronic hypoxemic/hypercarbic resp falure prob OHS  with vol overload  CAP Left lung atelectasis -resolved, status post bronchoscopy 12/21 for secretions >> dyssynchronous on prvc when not sedated 12/17 > changed to PCV  - wean on PSV as tol / PCV is rest mode -  recurrent mucus plugs> restarted mucomyst 12/30 -  try again to mobilize / wean 12/31  >>> discussed LTAC with Dr Carles Collet, agree to proceed, likely will needs at least 6 weeks to wean with short term goal= off vent all day, nocturnal  PCV (which may well prove to be longterm)  2)  AKI on CKD with vol overload on ACEi PTA Lab Results  Component Value Date   CREATININE 2.24 (H) 11/05/2020   CREATININE 2.52 (H) 11/04/2020   CREATININE 2.83 (H) 11/03/2020   -  holding  acei indefinitely -   Trending in the right direction now off diuretics   3) history of atrial flutter -  IV heparin / amiodarone drip > not ideal longterm esp in pt with chronic resp failure as risk missing early signs of amiodarone pulmonary toxicity but probably ok fo rnow    4) sedation needs -on Precedex + fent  prn -Goal RASS 0 to -1 - added clonidine  12/30 and should be able to wean down and ideally off precedex  - if too drowsy or bp too low back off precedex instead of holding clonidine    5) severe OSA -last [facility] eval; 2019  progressed to tracheostomy#4  capped and using auto CPAP with goal of eventual decannulation, lost to follow-up since then - LTAC best option here      Best practice (evaluated daily)   Diet: TFs Pain/Anxiety/Delirium protocol (if indicated): Precedex/as needed fentanyl VAP protocol (if indicated): Y  DVT prophylaxis: IV heparin  GI prophylaxis: Protonix Glucose control: per triad Mobility: start to mobilize to chair as tol  last date of multidisciplinary goals of care:  Per triad Disposition: ICU [facility] > LTAC when bed available,  reasonable to do PEG here first if needed to expedite LTAC placement  Labs   CBC: Recent Labs  Lab 10/31/20 0329 11/01/20 1145 11/03/20 0223 11/04/20 0519 11/05/20 0616  WBC 8.5 7.3 6.1 5.9 5.8  HGB 11.5* 11.9* 12.1* 11.1* 11.4*  HCT 41.4 40.5 40.9 39.2 39.1  MCV 104.8* 101.0* 100.2* 101.6* 101.3*  PLT 169 172 177 199 564    Basic Metabolic Panel: Recent Labs  Lab 11/01/20 0737 11/02/20 0440 11/03/20 0223 11/04/20 0519 11/05/20 0616  NA 136 139 138 143 141  K 3.9 4.3 4.0 4.2 4.2  CL 98 101 103 105 105  CO2 _0 GLUCOSE 136* 118* 126* 136* 129*  BUN 102* 104* 104* 103* 98*  CREATININE 3.43* 3.16* 2.83* 2.52* 2.24*  CALCIUM 9.2 9.4 9.7 9.9 10.1  PHOS 4.6 5.1* 4.8* 5.0* 4.9*   GFR: Estimated Creatinine Clearance: 50.9 mL/min (A) (by C-G formula based on SCr of 2.24 mg/dL (H)). Recent Labs  Lab 11/01/20 1145 11/03/20 0223 11/04/20 0519 11/05/20 0616  WBC 7.3 6.1 5.9 5.8    Liver Function Tests: Recent Labs  Lab 11/01/20 0737 11/02/20 0440 11/03/20 0223 11/04/20 0519 11/05/20 0616  ALBUMIN 2.5* 2.6* 2.5* 2.3* 2.3*   No results for input(s): LIPASE, AMYLASE in the last 168 hours. No results for input(s): AMMONIA in the last 168 hours.  ABG    Component Value Date/Time   PHART 7.242 (L) 10/20/2020 2355   PCO2ART 79.3 (HH) 10/20/2020 2355   PO2ART 80.0 (L) 10/20/2020 2355   HCO3 27.6 10/20/2020 2355   O2SAT 93.6 10/20/2020 2355     Coagulation Profile: No results for input(s): INR, PROTIME in the last 168 hours.  Cardiac Enzymes: No results for input(s): CKTOTAL, CKMB, CKMBINDEX, TROPONINI in the last 168 hours.  HbA1C: No results found for: HGBA1C  CBG: Recent Labs  Lab 11/04/20 1127 11/04/20 1557 11/04/20 1938 11/04/20 2321 11/05/20 0853  GLUCAP 106* 122* 131* 129* 108*     Christinia Gully, MD [facility] [place] Cell [phone]  After 7:00 pm call Elink  [phone]

## 2020-11-05 NOTE — Progress Notes (Signed)
ANTICOAGULATION CONSULT NOTE  Pharmacy Consult:  Heparin Indication: atrial fibrillation  No Known Allergies  Patient Measurements: Height: 5\' 7"  (170.2 cm) Weight: (!) 139.3 kg (307 lb 1.6 oz) IBW/kg (Calculated) : 66.1 HEPARIN DW (KG): 103.8  Vital Signs: Temp: 99.1 F (37.3 C) (12/31 0000) Temp Source: Axillary (12/31 0000) BP: 107/53 (12/31 0600) Pulse Rate: 65 (12/31 0600)  Labs: Recent Labs    11/03/20 0223 11/04/20 0519 11/05/20 0616  HGB 12.1* 11.1* 11.4*  HCT 40.9 39.2 39.1  PLT 177 199 228  HEPARINUNFRC 0.37 0.32 0.34  CREATININE 2.83* 2.52* 2.24*    Estimated Creatinine Clearance: 50.9 mL/min (A) (by C-G formula based on SCr of 2.24 mg/dL (H)).  Assessment: Pharmacy consulted to dose heparin infusion for this 54 yo male with atrial fibrillation.  Patient was  taking apixaban PTA and was on treatment-dose Lovenox from 12-17 to 12-20. Heparin is being started due to patient's unstable renal function.   Goal of Therapy:  Heparin level 0.3-0.7 units/ml Monitor platelets by anticoagulation protocol: Yes   11/05/20 1100 update:  HL: 0.34 IU/mL-->in goal range on heparin at 2300 units/hr CBC: Hb 11.4      Plates: 177>199>228  RN reports no bleeding complication or issues with infusion site   Plan:  Continue heparin infusion at 2300 units/hr Daily heparin level and CBC Monitor for signs and symptoms of bleeding F/U transition to oral anti-coagulant    Despina Pole, Pharm. D. Clinical Pharmacist 11/05/2020 11:21 AM

## 2020-11-06 LAB — RENAL FUNCTION PANEL
Albumin: 2.3 g/dL — ABNORMAL LOW (ref 3.5–5.0)
Anion gap: 8 (ref 5–15)
BUN: 92 mg/dL — ABNORMAL HIGH (ref 6–20)
CO2: 27 mmol/L (ref 22–32)
Calcium: 10.4 mg/dL — ABNORMAL HIGH (ref 8.9–10.3)
Chloride: 103 mmol/L (ref 98–111)
Creatinine, Ser: 2.06 mg/dL — ABNORMAL HIGH (ref 0.61–1.24)
GFR, Estimated: 38 mL/min — ABNORMAL LOW (ref >60)
Glucose, Bld: 126 mg/dL — ABNORMAL HIGH (ref 70–99)
Phosphorus: 4.1 mg/dL (ref 2.5–4.6)
Potassium: 4.2 mmol/L (ref 3.5–5.1)
Sodium: 138 mmol/L (ref 135–145)

## 2020-11-06 LAB — CBC
HCT: 39.2 % (ref 39.0–52.0)
Hemoglobin: 11.6 g/dL — ABNORMAL LOW (ref 13.0–17.0)
MCH: 29.3 pg (ref 26.0–34.0)
MCHC: 29.6 g/dL — ABNORMAL LOW (ref 30.0–36.0)
MCV: 99 fL (ref 80.0–100.0)
Platelets: 261 10*3/uL (ref 150–400)
RBC: 3.96 MIL/uL — ABNORMAL LOW (ref 4.22–5.81)
RDW: 14 % (ref 11.5–15.5)
WBC: 7.3 10*3/uL (ref 4.0–10.5)
nRBC: 0 % (ref 0.0–0.2)

## 2020-11-06 LAB — GLUCOSE, CAPILLARY
Glucose-Capillary: 103 mg/dL — ABNORMAL HIGH (ref 70–99)
Glucose-Capillary: 104 mg/dL — ABNORMAL HIGH (ref 70–99)
Glucose-Capillary: 118 mg/dL — ABNORMAL HIGH (ref 70–99)

## 2020-11-06 LAB — BRAIN NATRIURETIC PEPTIDE: B Natriuretic Peptide: 201 pg/mL — ABNORMAL HIGH (ref 0.0–100.0)

## 2020-11-06 LAB — HEPARIN LEVEL (UNFRACTIONATED): Heparin Unfractionated: 0.26 IU/mL — ABNORMAL LOW (ref 0.30–0.70)

## 2020-11-06 MED ORDER — FUROSEMIDE 10 MG/ML IJ SOLN
20.0000 mg | Freq: Once | INTRAMUSCULAR | Status: AC
  Administered 2020-11-06: 20 mg via INTRAVENOUS
  Filled 2020-11-06: qty 2

## 2020-11-06 NOTE — Progress Notes (Signed)
ANTICOAGULATION CONSULT NOTE  Pharmacy Consult:  Heparin Indication: atrial fibrillation  No Known Allergies  Patient Measurements: Height: 5\' 7"  (170.2 cm) Weight: (!) 139.3 kg (307 lb 1.6 oz) IBW/kg (Calculated) : 66.1 HEPARIN DW (KG): 103.8  Vital Signs: Temp: 98.9 F (37.2 C) (01/01 0726) Temp Source: Oral (01/01 0726) BP: 121/63 (01/01 0600) Pulse Rate: 64 (01/01 0726)  Labs: Recent Labs    11/04/20 0519 11/05/20 0616 11/06/20 0408  HGB 11.1* 11.4* 11.6*  HCT 39.2 39.1 39.2  PLT 199 228 261  HEPARINUNFRC 0.32 0.34 0.26*  CREATININE 2.52* 2.24* 2.06*    Estimated Creatinine Clearance: 55.3 mL/min (A) (by C-G formula based on SCr of 2.06 mg/dL (H)).  Assessment: Pharmacy consulted to dose heparin infusion for this 55 yo male with atrial fibrillation.  Patient was  taking apixaban PTA and was on treatment-dose Lovenox from 12-17 to 12-20. Heparin is being started due to patient's unstable renal function.   Goal of Therapy:  Heparin level 0.3-0.7 units/ml Monitor platelets by anticoagulation protocol: Yes   11/06/20 0900 update:  HL: 0.26 IU/mL-->below goal range on heparin at 2300 units/hr CBC: Hb 11.4 >11.6        Plates: 177>199>228>261  RN reports no bleeding complication or issues with infusion site   Plan:  Increase  heparin infusion rate to  2400 units/hr Daily heparin level and CBC Monitor for signs and symptoms of bleeding F/U transition to oral anti-coagulant    Despina Pole, Pharm. D. Clinical Pharmacist 11/06/2020 9:03 AM

## 2020-11-06 NOTE — Progress Notes (Signed)
PROGRESS NOTE  Jack Cox FMB:[id number] DOB: 1966/02/16 DOA: 10/20/2020 PCP: [name], MD  Brief History: 55 y.o.malewith medical history ofdiastolic CHF, atrial flutter, obstructive sleep apnea, hypertension, right bundle branch block presenting with shortness of breath for the last 2 to 3 days. Unfortunately, the patient is encephalopathic at the time of my evaluation; therefore, history is limited. Apparently, the patient went to see his nephrologist earlier on 10/20/2020. He was noted to have oxygen saturation in the 60s. As result, he was sent to the emergency department for further evaluation. Initially, the patient was awake and conversant with oxygen saturation in the low 70s. He was placed on a nonrebreather after which the patient became obtunded. VBG showed pH 7.1 66/97/106 on 80%. Subsequently, the patient was placed on the ventilator with PRVC,PEEP of 5, FiO2 70%. After approximately 10 to 15 minutes on the ventilator, the patient remained somnolent, but awakened and was able to tell me his name. He did state that he has not been taking his torsemide as directed. In addition, the patient has been sleeping in a recliner for an unclear duration of time secondary to shortness of breath. At baseline, the patient has a size 4 noncuffed tracheostomy. He is on supplemental oxygen 2.5 L during the day and 4 L at nighttime. There is no reports of nausea, vomiting, diarrhea, chest pain, abdominal pain. In the emergency department, the patient was afebrile and hemodynamically stable with oxygen saturation 93-94% on the ventilator with FiO2 70%. Chest x-ray showed left-sided pleural effusion with increased interstitial markings. BNP 790. PCT 0.12. BMP showed a serum creatinine 2.43. LFTs were unremarkable. WBC 8.1, hemoglobin 14.5, platelets 253,000. The patient was given ceftriaxone and azithromycin. He was also given furosemide 80 mg IV x1. COVID-19 RT-PCR  negative. He was continued on IV lasix 80 mg bid with uptrending of his serum creatinine. Renal was consulted to assist.Pulmonary was also consulted to assist also.  Assessment/Plan: Acute on chronic respiratory failure with hypoxia and hypercarbia -Secondary to CHF in the setting of OSA -remainson ventilator with trachwith trach collar trials during day -pulmonary consulted to help with vent management -dyssynchronous on prvc when not sedated 12/17 > changed to PCV -11/02/20 personally reviewed CXR--increased interstitial markings--bilateral patchy opacities -PCT 0.12>>0.61 -having mucus plugging--needs aggressive suctioning -12/17 and 12/26 CXR--white out of left lung likely mucus plugging -Mucomyst 12/21>>12/27, restart 12/30 -10/26/20-bronchoscopy--Thick inspissated mucus in proximal airways & BL lower lobes, removed with suctioning - hypertonic saline nebulizer treatments started by pulmonology 12/21>>12/27 -trach collar trials 12/28>> -mucomyst d/ced 12/27, restarted on 12/30 due to mucus plugging -continue albuterol nebs -wean with PSV and PCV rest mode -request IR for PEG  Acute on chronic diastolic CHF/Cor Pulmonale -06/25/2019 echo EF 55%, trivial TR -12/16/21echo--EF 60-65%, no WMA, RV pressure overload, mild TR -he has been diuresing well with no diuretic support -Following BMP -Accurate I's and O's--NEG 17.6L -Daily weights  Atrial flutter- controlled -apixabantemporarilyon hold, he is on an IV heparin infusionfor full anticoagulation -rate controlledbut had an episode 3-4 hours 12/18-12/19 evening -resumed low dose metoprolol 12.5 mg BID per tube   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  -Monitor with diuresis -consult nephrology--appreciated-->albumin+lasix bid per renal -last dose of lasix was 10/25/20 -last dose albumin 12/26 -renal signed  off 12/29 -1/1--lasix 20 mg IV x 1  FEN -had mild hypoglycemia -NG placed 12/17 -  switchedto Vital High Protein_0 /hr-->hypoglycemia improved  Hypernatremia- resolved now after adding more free water to feeding tuberegimen.   Fever - improving with pneumonia management -101.0 fever evening 12/18 and 100.7 12/21 -blood cultures x 2--neg to date -UA and urine culture--no significant pyuria -12/16 trach aspirate-->flora -10/26/20-->BAL sent for culture -completed course of cefepime12/27 -resolved  Essential hypertension -overall stable -metoprolol low dose with holding parameters -clonidine initiated by pulm  OSA -The patient has a tracheostomy placed in 2001 -He has a size 4 cuffless trach  Right bundle branch block -This has been chronic with review of the medical records  Morbid obesity -BMI 52.94 -lifestyle modification  GOC -palliative following -currently full scope of care  Status is: Inpatient  Remains inpatient appropriate because:IV treatments appropriate due to intensity of illness or inability to take PO  Dispo: The patient is from:Home Anticipated d/c is DV:[facility] Anticipated d/c date is: >3 days Patient currently is not medically stable to d/c.   Family Communication:brotherand spouse updated 11/06/20  Consultants:pulm, renal  Code Status: FULL  DVT Prophylaxis: IV heparin infusion for full anticoagulation   Procedures: As Listed in Progress Note Above  Antibiotics: Cefepime 12/21>>12/27       Subjective: Patient denies fevers, chills, headache, chest pain, dyspnea, nausea, vomiting, diarrhea, abdominal pain, dysuria   Objective: Vitals:   11/06/20 0726 11/06/20 0812 11/06/20 1145 11/06/20 1246  BP:      Pulse: 64     Resp: (!) 33     Temp: 98.9 F (37.2 C)  98.7 F (37.1 C)   TempSrc: Oral  Oral   SpO2: 91% 92%  94%  Weight:      Height:         Intake/Output Summary (Last 24 hours) at 11/06/2020 1255 Last data filed at 11/06/2020 0000 Gross per 24 hour  Intake 2320.52 ml  Output 2375 ml  Net -54.48 ml   Weight change:  Exam:   General:  Pt is alert, follows commands appropriately, not in acute distress  HEENT: No icterus, No thrush, No neck mass, [place]/AT  Cardiovascular: RRR, S1/S2, no rubs, no gallops  Respiratory: bilateral rhonchi  Abdomen: Soft/+BS, non tender, non distended, no guarding  Extremities: 1+LE edema, No lymphangitis, No petechiae, No rashes, no synovitis   Data Reviewed: I have personally reviewed following labs and imaging studies Basic Metabolic Panel: Recent Labs  Lab 11/02/20 0440 11/03/20 0223 11/04/20 0519 11/05/20 0616 11/06/20 0408  NA 139 138 143 141 138  K 4.3 4.0 4.2 4.2 4.2  CL 101 103 105 105 103  CO2 _1 GLUCOSE 118* 126* 136* 129* 126*  BUN 104* 104* 103* 98* 92*  CREATININE 3.16* 2.83* 2.52* 2.24* 2.06*  CALCIUM 9.4 9.7 9.9 10.1 10.4*  PHOS 5.1* 4.8* 5.0* 4.9* 4.1   Liver Function Tests: Recent Labs  Lab 11/02/20 0440 11/03/20 0223 11/04/20 0519 11/05/20 0616 11/06/20 0408  ALBUMIN 2.6* 2.5* 2.3* 2.3* 2.3*   No results for input(s): LIPASE, AMYLASE in the last 168 hours. No results for input(s): AMMONIA in the last 168 hours. Coagulation Profile: No results for input(s): INR, PROTIME in the last 168 hours. CBC: Recent Labs  Lab 11/01/20 1145 11/03/20 0223 11/04/20 0519 11/05/20 0616 11/06/20 0408  WBC 7.3 6.1 5.9 5.8 7.3  HGB 11.9* 12.1* 11.1* 11.4* 11.6*  HCT 40.5 40.9 39.2 39.1 39.2  MCV 101.0* 100.2* 101.6* 101.3* 99.0  PLT 172 177 199 228 261   Cardiac Enzymes: No results for input(s):  CKTOTAL, CKMB, CKMBINDEX, TROPONINI in the last 168 hours. BNP: Invalid input(s): POCBNP CBG: Recent Labs  Lab 11/05/20 0853 11/05/20 1150 11/05/20 1720 11/06/20 0725 11/06/20 1146  GLUCAP 108* 122* 119* 103* 104*   HbA1C: No results for  input(s): HGBA1C in the last 72 hours. Urine analysis:    Component Value Date/Time   COLORURINE YELLOW 10/28/2020 0429   APPEARANCEUR HAZY (A) 10/28/2020 0429   LABSPEC 1.014 10/28/2020 0429   PHURINE 5.0 10/28/2020 0429   GLUCOSEU NEGATIVE 10/28/2020 0429   HGBUR SMALL (A) 10/28/2020 0429   BILIRUBINUR NEGATIVE 10/28/2020 0429   KETONESUR NEGATIVE 10/28/2020 0429   PROTEINUR 100 (A) 10/28/2020 0429   NITRITE NEGATIVE 10/28/2020 0429   LEUKOCYTESUR NEGATIVE 10/28/2020 0429   Sepsis Labs: _0 (procalcitonin:4,lacticidven:4) ) Recent Results (from the past 240 hour(s))  Culture, respiratory (non-expectorated)     Status: None (Preliminary result)   Collection Time: 11/05/20 11:46 AM   Specimen: Tracheal Aspirate; Respiratory  Result Value Ref Range Status   Specimen Description   Final    TRACHEAL ASPIRATE Performed at [facility], [address], [place], [place] [address]    Special Requests   Final    NONE Performed at [facility], [address] [place], [place] [address]    Gram Stain   Final    MODERATE WBC PRESENT,BOTH PMN AND MONONUCLEAR FEW GRAM POSITIVE COCCI FEW GRAM NEGATIVE RODS RARE GRAM POSITIVE RODS    Culture   Final    CULTURE REINCUBATED FOR BETTER GROWTH Performed at [facility], [place] [address], [place], [place] [address]    Report Status PENDING  Incomplete     Scheduled Meds: . acetylcysteine  1 mL Nebulization QID  . albuterol  2.5 mg Nebulization QID  . chlorhexidine gluconate (MEDLINE KIT)  15 mL Mouth Rinse BID  . Chlorhexidine Gluconate Cloth  6 each Topical Daily  . cloNIDine  0.1 mg Per Tube TID  . feeding supplement (PROSource TF)  90 mL Per Tube TID  . free water  400 mL Per Tube Q4H  . furosemide  20 mg Intravenous Once  . mouth rinse  15 mL Mouth Rinse 2 times per day  . metoprolol tartrate  12.5 mg Per Tube BID  . pantoprazole sodium  40 mg Per Tube Q1200  . sodium chloride flush  3 mL Intravenous Q12H    Continuous Infusions: . sodium chloride    . sodium chloride 250 mL (11/02/20 1546)  . amiodarone    . dexmedetomidine (PRECEDEX) IV infusion 1 mcg/kg/hr (11/06/20 0000)  . feeding supplement (VITAL HIGH PROTEIN) 1,000 mL (11/04/20 1600)  . heparin 2,400 Units/hr (11/06/20 1030)    Procedures/Studies: US RENAL  Result Date: 10/28/2020 CLINICAL DATA:  Chronic kidney disease.  Acute kidney injury. EXAM: RENAL / URINARY TRACT ULTRASOUND COMPLETE COMPARISON:  CT 06/12/2018 FINDINGS: Right Kidney: Renal measurements: 11.0 x 6.4 x 5.2 cm = volume: 190 mL. Increased echogenicity. No focal lesion or hydronephrosis. Left Kidney: Renal measurements: 11.2 x 7.6 x 6.2 cm = volume: 276 mL. Increased echogenicity. No focal lesion or hydronephrosis. Bladder: Foley catheter within the bladder. Other: None. IMPRESSION: Slightly enlarged kidneys showing increased echogenicity. Findings could be due to chronic diabetic glomerulonephropathy or acute nephritis. No obstruction or focal lesion. Electronically Signed   By: Nelson Chimes M.D.   On: 10/28/2020 14:30   DG Chest Port 1 View  Result Date: 11/05/2020 CLINICAL DATA:  Respiratory failure EXAM: PORTABLE CHEST 1 VIEW COMPARISON:  11/02/2020  FINDINGS: Tracheostomy and NG tube are unchanged. Cardiomegaly. Mild vascular congestion. Bilateral airspace opacities again noted, worse at the left base. This is stable since prior study. No visible effusions or pneumothorax. IMPRESSION: Cardiomegaly. Bilateral airspace disease, most pronounced in the left base, stable since prior study. Electronically Signed   By: Rolm Baptise M.D.   On: 11/05/2020 08:03   DG CHEST PORT 1 VIEW  Result Date: 11/02/2020 CLINICAL DATA:  NG tube placement. EXAM: PORTABLE CHEST 1 VIEW COMPARISON:  Radiograph yesterday. FINDINGS: The tip of the enteric tube is below the diaphragm, not included in the field of view. The side-port is not well visualized but likely below the diaphragm. Stable  cardiomegaly. Patchy airspace opacities in both lung bases, unchanged. IMPRESSION: 1. Tip of the enteric tube below the diaphragm, not included in the field of view. 2. Unchanged patchy bibasilar airspace opacities. Electronically Signed   By: Keith Rake M.D.   On: 11/02/2020 21:51   DG CHEST PORT 1 VIEW  Result Date: 11/01/2020 CLINICAL DATA:  Acute on chronic respiratory failure. EXAM: PORTABLE CHEST 1 VIEW COMPARISON:  10/31/2020 FINDINGS: The tracheostomy tube is stable. The NG tube is stable. Much improved left lung aeration when compared to the prior study. The left upper lobe is now aerated. Persistent airspace process in the right lung and at the left lung base. No pleural effusions or pneumothorax. IMPRESSION: 1. Much improved left lung aeration when compared to the prior study. 2. Persistent right lung and left basilar airspace process. Electronically Signed   By: Marijo Sanes M.D.   On: 11/01/2020 05:55   DG CHEST PORT 1 VIEW  Result Date: 10/31/2020 CLINICAL DATA:  Acute on chronic respiratory EXAM: PORTABLE CHEST 1 VIEW COMPARISON:  Failure four days ago FINDINGS: Enteric tube which reaches the stomach. Tracheostomy tube in place. Interval white out of the left chest with volume loss. Progressive hazy opacification of the right lower chest. Heart size is largely obscured. No visible air leak. IMPRESSION: 1. Interval white out of the left chest, likely plugging and pulmonary collapse. 2. Progressive airspace disease or pleural effusion on the right. Electronically Signed   By: Monte Fantasia M.D.   On: 10/31/2020 04:22   DG Chest Port 1 View  Result Date: 10/27/2020 CLINICAL DATA:  Acute respiratory failure EXAM: PORTABLE CHEST 1 VIEW COMPARISON:  Yesterday FINDINGS: Enteric and tracheostomy tubes remain in good position. Cardiomegaly. Interstitial opacity which could be bronchitic or congestive. There is improved aeration at the right base. Negative for pneumothorax. IMPRESSION:  1. Improved aeration. 2. Bronchitic or congestive interstitial opacity. Electronically Signed   By: Monte Fantasia M.D.   On: 10/27/2020 07:46   DG Chest Port 1 View  Result Date: 10/26/2020 CLINICAL DATA:  Shortness of breath for 2-3 days EXAM: PORTABLE CHEST 1 VIEW COMPARISON:  Three days ago FINDINGS: Progressive hazy appearance of the right mid to lower chest. Dense retrocardiac opacity persists. An enteric tube and tracheostomy tube remain in place. Cardiomegaly and vascular pedicle widening. IMPRESSION: Pleural fluid/pulmonary opacification at the bases which has progressed from 3 days ago. Electronically Signed   By: Monte Fantasia M.D.   On: 10/26/2020 06:02   DG CHEST PORT 1 VIEW  Result Date: 10/23/2020 CLINICAL DATA:  Shortness of breath. EXAM: PORTABLE CHEST 1 VIEW COMPARISON:  October 22, 2020 FINDINGS: The near complete opacification of the left hemithorax seen on the comparison chest x-ray has resolved. Persistent bibasilar opacities are likely atelectasis. No pneumothorax. A tracheostomy tube  is in good position. An NG tube terminates in the stomach. No other acute abnormalities. IMPRESSION: 1. Support apparatus as above. 2. Bibasilar atelectasis and a possible small left effusion. Improved aeration of the left lung. Electronically Signed   By: Dorise Bullion III M.D   On: 10/23/2020 14:57   DG Chest Port 1 View  Result Date: 10/22/2020 CLINICAL DATA:  Acute respiratory failure. EXAM: PORTABLE CHEST 1 VIEW COMPARISON:  10/20/2020 FINDINGS: Tracheostomy tube overlies the airway. The cardiac silhouette is enlarged. There is persistent mild central pulmonary vascular congestion without overt edema. Airspace opacities in the left greater than right mid and lower lungs have slightly improved. Small pleural effusions are questioned. No pneumothorax is identified. IMPRESSION: Slight improvement of bilateral airspace opacities which could reflect pneumonia or atelectasis. Electronically  Signed   By: [name] M.D.   On: 10/22/2020 09:16   DG Chest Port 1 View  Result Date: 10/20/2020 CLINICAL DATA:  Questionable sepsis EXAM: PORTABLE CHEST 1 VIEW COMPARISON:  Chest x-ray 06/12/2018 report without images. FINDINGS: Tracheostomy tube terminates approximately 7.5 cm above the carina. The heart size and mediastinal contours are not well visualized. Bilateral hilar vasculature prominence. Aortic arch calcifications. Silhouetting off of the left hemidiaphragm and left cardiac border. Partial silhouetting off the right cardiac border and right hemidiaphragm. Increased interstitial markings. Likely at least trace to small volume left pleural effusion. No pneumothorax. No acute osseous abnormality. IMPRESSION: 1. Left lung opacity and likely at least trace to small volume left pleural effusion with silhouetting off of the left diaphragm and heart border. Concern for infection/inflammation. 2. Right lower lobe and middle lobe airspace opacity could represent a combination of atelectasis, infection, inflammation. 3. At least mild pulmonary edema. 4. Consider chest x-ray PA and lateral view for further evaluation. Electronically Signed   By: Iven Finn M.D.   On: 10/20/2020 12:26   DG Chest Port 1V same Day  Result Date: 10/22/2020 CLINICAL DATA:  Status post NG tube placement. EXAM: PORTABLE CHEST 1 VIEW COMPARISON:  Single-view of the chest earlier today. FINDINGS: The upper chest is off the margin of the film. NG tube is looped in the upper trachea or esophagus. Visualized left chest is completely whited out on this study. IMPRESSION: NG tube is looped in the upper esophagus or trachea. Complete whiteout of the left chest is new since the study earlier today could be due to mucous plugging and left lung collapse or rapidly developing pleural effusion and airspace disease. Electronically Signed   By: [name] M.D.   On: 10/22/2020 15:16   DG Abd Portable 1V  Result Date:  10/22/2020 CLINICAL DATA:  Nasogastric tube placement EXAM: PORTABLE ABDOMEN - 1 VIEW COMPARISON:  Portable exam 1613 hours compared to 1523 hours FINDINGS: Tip of nasogastric tube projects over distal gastric antrum, consider withdrawal 5 cm. Bowel gas pattern normal. Opacification of the inferior LEFT hemithorax again seen. IMPRESSION: Tip of nasogastric tube projects over distal gastric antrum. Consider withdrawal of nasogastric tube 5 cm. Electronically Signed   By: [name] M.D.   On: 10/22/2020 16:36   DG Abd Portable 1V  Result Date: 10/22/2020 CLINICAL DATA:  Nasogastric tube placement EXAM: PORTABLE ABDOMEN - 1 VIEW COMPARISON:  None. FINDINGS: Nasogastric tube extends into the gastric cardia where it loops upon itself with the tip at the junction of the mid and distal thirds of the esophagus. There is no bowel dilatation or air-fluid level to suggest bowel obstruction. No free air.  There is consolidation left lower lobe. IMPRESSION: Nasogastric tube extends into the stomach where it loops upon itself with the tip at the junction of the mid and distal thirds of the esophagus. No bowel obstruction or free air evident. Consolidation left lower lobe. These results will be called to the ordering clinician or representative by the Radiologist Assistant, and communication documented in the PACS or [employer]. Electronically Signed   By: Lowella Grip III M.D.   On: 10/22/2020 15:34   ECHOCARDIOGRAM COMPLETE  Result Date: 10/21/2020    ECHOCARDIOGRAM REPORT   Patient Name:   KINSEY COWSERT Date of Exam: 10/21/2020 Medical Rec #:  [id number]        Height:       68.0 in Accession #:    [id number]       Weight:       325.8 lb Date of Birth:  Jul 05, 1966        BSA:          2.515 m Patient Age:    41 years         BP:           112/61 mmHg Patient Gender: M                HR:           51 bpm. Exam Location:  [facility] Procedure: 2D Echo Indications:    CHF-Acute Diastolic Y18.56  History:         Patient has no prior history of Echocardiogram examinations.                 CHF; Risk Factors:Non-Smoker. Acute on chronic respiratory                 failure with hypoxia.  Sonographer:    Leavy Cella RDCS (AE) Referring Phys: [id number] Andrea Ferrer IMPRESSIONS  1. Left ventricular ejection fraction, by estimation, is 60 to 65%. The left ventricle has normal function. The left ventricle has no regional wall motion abnormalities. There is severe left ventricular hypertrophy. Left ventricular diastolic parameters  are indeterminate. There is the interventricular septum is flattened in systole and diastole, consistent with right ventricular pressure and volume overload.  2. Right ventricular systolic function is mildly reduced. The right ventricular size is mildly enlarged. Tricuspid regurgitation signal is inadequate for assessing PA pressure.  3. Right atrial size was upper normal.  4. The pericardial effusion is posterior to the left ventricle.  5. The mitral valve is grossly normal. Trivial mitral valve regurgitation.  6. The aortic valve is tricuspid. Aortic valve regurgitation is not visualized.  7. The inferior vena cava is normal in size with greater than 50% respiratory variability, suggesting right atrial pressure of 3 mmHg. FINDINGS  Left Ventricle: Left ventricular ejection fraction, by estimation, is 60 to 65%. The left ventricle has normal function. The left ventricle has no regional wall motion abnormalities. The left ventricular internal cavity size was normal in size. There is  severe left ventricular hypertrophy. The interventricular septum is flattened in systole and diastole, consistent with right ventricular pressure and volume overload. Left ventricular diastolic parameters are indeterminate. Right Ventricle: The right ventricular size is mildly enlarged. No increase in right ventricular wall thickness. Right ventricular systolic function is mildly reduced. Tricuspid regurgitation signal is  inadequate for assessing PA pressure. Left Atrium: Left atrial size was normal in size. Right Atrium: Right atrial size was upper normal. Pericardium: Trivial pericardial effusion is present. The  pericardial effusion is posterior to the left ventricle. Mitral Valve: The mitral valve is grossly normal. Trivial mitral valve regurgitation. Tricuspid Valve: The tricuspid valve is grossly normal. Tricuspid valve regurgitation is mild. Aortic Valve: The aortic valve is tricuspid. There is moderate aortic valve annular calcification. Aortic valve regurgitation is not visualized. Pulmonic Valve: The pulmonic valve was grossly normal. Pulmonic valve regurgitation is trivial. Aorta: The aortic root is normal in size and structure. Venous: The inferior vena cava is normal in size with greater than 50% respiratory variability, suggesting right atrial pressure of 3 mmHg. IAS/Shunts: No atrial level shunt detected by color flow Doppler.  LEFT VENTRICLE PLAX 2D LVIDd:         4.66 cm  Diastology LVIDs:         2.03 cm  LV e' medial:    5.87 cm/s LV PW:         1.76 cm  LV E/e' medial:  14.0 LV IVS:        1.73 cm  LV e' lateral:   8.49 cm/s LVOT diam:     1.90 cm  LV E/e' lateral: 9.7 LVOT Area:     2.84 cm  RIGHT VENTRICLE RV S prime:     19.40 cm/s TAPSE (M-mode): 1.9 cm LEFT ATRIUM             Index       RIGHT ATRIUM           Index LA diam:        3.50 cm 1.39 cm/m  RA Area:     24.60 cm LA Vol (A2C):   43.9 ml 17.46 ml/m RA Volume:   82.30 ml  32.73 ml/m LA Vol (A4C):   43.8 ml 17.42 ml/m LA Biplane Vol: 44.4 ml 17.66 ml/m   AORTA Ao Root diam: 3.10 cm MITRAL VALVE MV Area (PHT): 2.64 cm    SHUNTS MV Decel Time: 287 msec    Systemic Diam: 1.90 cm MV E velocity: 82.30 cm/s MV A velocity: 59.10 cm/s MV E/A ratio:  1.39 Rozann Lesches MD Electronically signed by Rozann Lesches MD Signature Date/Time: 10/21/2020/4:33:43 PM    Final     [name], DO  [facility]  If 7PM-7AM, please contact  night-coverage [email or web address] Password [email or web address] 11/06/2020, 12:55 PM   LOS: 17 days

## 2020-11-07 ENCOUNTER — Other Ambulatory Visit: Payer: Self-pay

## 2020-11-07 LAB — RENAL FUNCTION PANEL
Albumin: 2.5 g/dL — ABNORMAL LOW (ref 3.5–5.0)
Anion gap: 8 (ref 5–15)
BUN: 93 mg/dL — ABNORMAL HIGH (ref 6–20)
CO2: 28 mmol/L (ref 22–32)
Calcium: 10.2 mg/dL (ref 8.9–10.3)
Chloride: 101 mmol/L (ref 98–111)
Creatinine, Ser: 1.98 mg/dL — ABNORMAL HIGH (ref 0.61–1.24)
GFR, Estimated: 39 mL/min — ABNORMAL LOW (ref >60)
Glucose, Bld: 127 mg/dL — ABNORMAL HIGH (ref 70–99)
Phosphorus: 5.3 mg/dL — ABNORMAL HIGH (ref 2.5–4.6)
Potassium: 4.4 mmol/L (ref 3.5–5.1)
Sodium: 137 mmol/L (ref 135–145)

## 2020-11-07 LAB — CBC
HCT: 40.1 % (ref 39.0–52.0)
Hemoglobin: 11.9 g/dL — ABNORMAL LOW (ref 13.0–17.0)
MCH: 29.5 pg (ref 26.0–34.0)
MCHC: 29.7 g/dL — ABNORMAL LOW (ref 30.0–36.0)
MCV: 99.3 fL (ref 80.0–100.0)
Platelets: 277 10*3/uL (ref 150–400)
RBC: 4.04 MIL/uL — ABNORMAL LOW (ref 4.22–5.81)
RDW: 14.2 % (ref 11.5–15.5)
WBC: 6.1 10*3/uL (ref 4.0–10.5)
nRBC: 0 % (ref 0.0–0.2)

## 2020-11-07 LAB — GLUCOSE, CAPILLARY
Glucose-Capillary: 130 mg/dL — ABNORMAL HIGH (ref 70–99)
Glucose-Capillary: 114 mg/dL — ABNORMAL HIGH (ref 70–99)
Glucose-Capillary: 112 mg/dL — ABNORMAL HIGH (ref 70–99)
Glucose-Capillary: 113 mg/dL — ABNORMAL HIGH (ref 70–99)

## 2020-11-07 LAB — HEPARIN LEVEL (UNFRACTIONATED): Heparin Unfractionated: 0.3 IU/mL (ref 0.30–0.70)

## 2020-11-07 MED ORDER — LABETALOL HCL 5 MG/ML IV SOLN
10.0000 mg | INTRAVENOUS | Status: DC | PRN
  Administered 2020-11-12: 10 mg via INTRAVENOUS
  Filled 2020-11-07: qty 4

## 2020-11-07 NOTE — Progress Notes (Signed)
PROGRESS NOTE  Jack Cox [name]:[id number] DOB: 03-23-1966 DOA: 10/20/2020 PCP: Jack Blitz, MD  Brief History: 55 y.o.malewith medical history ofdiastolic CHF, atrial flutter, obstructive sleep apnea, hypertension, right bundle branch block presenting with shortness of breath for the last 2 to 3 days. Unfortunately, the patient is encephalopathic at the time of my evaluation; therefore, history is limited. Apparently, the patient went to see his nephrologist earlier on 10/20/2020. He was noted to have oxygen saturation in the 60s. As result, he was sent to the emergency department for further evaluation. Initially, the patient was awake and conversant with oxygen saturation in the low 70s. He was placed on a nonrebreather after which the patient became obtunded. VBG showed pH 7.1 66/97/106 on 80%. Subsequently, the patient was placed on the ventilator with PRVC,PEEP of 5, FiO2 70%. After approximately 10 to 15 minutes on the ventilator, the patient remained somnolent, but awakened and was able to tell me his name. He did state that he has not been taking his torsemide as directed. In addition, the patient has been sleeping in a recliner for an unclear duration of time secondary to shortness of breath. At baseline, the patient has a size 4 noncuffed tracheostomy. He is on supplemental oxygen 2.5 L during the day and 4 L at nighttime. There is no reports of nausea, vomiting, diarrhea, chest pain, abdominal pain. In the emergency department, the patient was afebrile and hemodynamically stable with oxygen saturation 93-94% on the ventilator with FiO2 70%. Chest x-ray showed left-sided pleural effusion with increased interstitial markings. BNP 790. PCT 0.12. BMP showed a serum creatinine 2.43. LFTs were unremarkable. WBC 8.1, hemoglobin 14.5, platelets 253,000. The patient was given ceftriaxone and azithromycin. He was also given furosemide 80 mg IV x1. COVID-19 RT-PCR  negative. He was continued on IV lasix 80 mg bid with uptrending of his serum creatinine. Renal was consulted to assist.Pulmonary was also consulted to assist also.  Assessment/Plan: Acute on chronic respiratory failure with hypoxia and hypercarbia -Secondary to CHF in the setting of OSA -remainson ventilator with trachwith trach collar trials during day -pulmonary consulted to help with vent management -PCT 0.12>>0.61 -having mucus plugging--needs aggressive suctioning -12/17 and 12/26 CXR--white out of left lung likely mucus plugging -Mucomyst 12/21>>12/27, restart 12/30 -10/26/20-bronchoscopy--Thick inspissated mucus in proximal airways & BL lower lobes, removed with suctioning - hypertonic saline nebulizer treatments started by pulmonology 12/21>>12/27 -trach collar trials 12/28>> -mucomyst d/ced 12/27, restarted on 12/30 due to mucus plugging -continue albuterol nebs -wean with PSV and PCV rest mode -request IR consult for PEG  Acute on chronic diastolic CHF/Cor Pulmonale -06/25/2019 echo EF 55%, trivial TR -12/16/21echo--EF 60-65%, no WMA, RV pressure overload, mild TR -Accurate I's and O's--NEG so far -Daily weights  Atrial flutter- controlled -apixabantemporarilyon hold, he is on an IV heparin infusionfor full anticoagulation-may switch to Lovenox when renal function improves further -resumed low dose metoprolol 12.5 mg BID per tube   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  -Monitor with diuresis -consult nephrology--appreciated-->albumin+lasix bid per renal -last dose of lasix was 10/25/20 -last dose albumin 12/26 -renal signed off 12/29 -1/1--lasix 20 mg IV x 1  FEN -had mild hypoglycemia -NG placed 12/17 -switchedto Vital High Protein_0 /hr-->hypoglycemia improved  Hypernatremia- resolved now after adding more free water to feeding  tuberegimen.   Fever - improving with pneumonia management -101.0 fever evening 12/18  and 100.7 12/21 -blood cultures x 2--neg to date -UA and urine culture--no significant pyuria -12/16 trach aspirate-->flora -10/26/20-->BAL sent for culture -completed course of cefepime12/27 -resolved  Essential hypertension -overall stable -metoprolol low dose with holding parameters -clonidine initiated by pulm  OSA -The patient has a tracheostomy placed in 2001 -He has a size 4 cuffless trach  Right bundle branch block -This has been chronic with review of the medical records  Morbid obesity -BMI 52.94 -lifestyle modification  GOC -palliative following -currently full scope of care  Status is: Inpatient  Remains inpatient appropriate because:IV treatments appropriate due to intensity of illness or inability to take PO  Dispo: The patient is from:Home Anticipated d/c is PF:XTKW Anticipated d/c date is: >3 days Patient currently is not medically stable to d/c.   Family Communication:brotherat bedside on 11/07/2020  Consultants:pulm, renal  Code Status: FULL  DVT Prophylaxis: IV heparin infusion for full anticoagulation   Procedures: As Listed in Progress Note Above  Antibiotics: Cefepime 12/21>>12/27       Subjective: -Resting comfortably, able to communicate -Brother at bedside  Objective: Vitals:   11/07/20 1243 11/07/20 1300 11/07/20 1500 11/07/20 1510  BP: (!) 148/73 103/81 (!) 184/92 (!) 170/74  Pulse: 67 70 67 69  Resp:      Temp:      TempSrc:      SpO2: 92% 91% (!) 88% 92%  Weight:      Height:        Intake/Output Summary (Last 24 hours) at 11/07/2020 1635 Last data filed at 11/07/2020 1511 Gross per 24 hour  Intake 28190.62 ml  Output 5125 ml  Net 23065.62 ml   Weight change:  Exam:   General:  Pt is alert, follows commands appropriately, not in acute  distress  HEENT: Tracheostomy  cardiovascular: RRR, S1/S2, no rubs, no gallops  Respiratory: bilateral rhonchi, air movement is fair and symmetrical  Abdomen: Soft/+BS, non tender, non distended, no guarding  Extremities: 1+LE edema, No lymphangitis, No petechiae, No rashes, no synovitis  Psych-appears appropriate overall   Data Reviewed: I have personally reviewed following labs and imaging studies Basic Metabolic Panel: Recent Labs  Lab 11/03/20 0223 11/04/20 0519 11/05/20 0616 11/06/20 0408 11/07/20 0453  NA 138 143 141 138 137  K 4.0 4.2 4.2 4.2 4.4  CL 103 105 105 103 101  CO2 _0 GLUCOSE 126* 136* 129* 126* 127*  BUN 104* 103* 98* 92* 93*  CREATININE 2.83* 2.52* 2.24* 2.06* 1.98*  CALCIUM 9.7 9.9 10.1 10.4* 10.2  PHOS 4.8* 5.0* 4.9* 4.1 5.3*   Liver Function Tests: Recent Labs  Lab 11/03/20 0223 11/04/20 0519 11/05/20 0616 11/06/20 0408 11/07/20 0453  ALBUMIN 2.5* 2.3* 2.3* 2.3* 2.5*   No results for input(s): LIPASE, AMYLASE in the last 168 hours. No results for input(s): AMMONIA in the last 168 hours. Coagulation Profile: No results for input(s): INR, PROTIME in the last 168 hours. CBC: Recent Labs  Lab 11/03/20 0223 11/04/20 0519 11/05/20 0616 11/06/20 0408 11/07/20 0453  WBC 6.1 5.9 5.8 7.3 6.1  HGB 12.1* 11.1* 11.4* 11.6* 11.9*  HCT 40.9 39.2 39.1 39.2 40.1  MCV 100.2* 101.6* 101.3* 99.0 99.3  PLT 177 199 228 261 277   Cardiac Enzymes: No results for input(s): CKTOTAL, CKMB, CKMBINDEX, TROPONINI in the last 168 hours. BNP: Invalid input(s): POCBNP CBG: Recent Labs  Lab 11/06/20 0725 11/06/20 1146 11/06/20 1618 11/07/20 0721 11/07/20 1116  GLUCAP 103* 104* 118* 130* 114*  HbA1C: No results for input(s): HGBA1C in the last 72 hours. Urine analysis:    Component Value Date/Time   COLORURINE YELLOW 10/28/2020 0429   APPEARANCEUR HAZY (A) 10/28/2020 0429   LABSPEC 1.014 10/28/2020 0429   PHURINE 5.0 10/28/2020  0429   GLUCOSEU NEGATIVE 10/28/2020 0429   HGBUR SMALL (A) 10/28/2020 0429   BILIRUBINUR NEGATIVE 10/28/2020 0429   KETONESUR NEGATIVE 10/28/2020 0429   PROTEINUR 100 (A) 10/28/2020 0429   NITRITE NEGATIVE 10/28/2020 0429   LEUKOCYTESUR NEGATIVE 10/28/2020 0429   Sepsis Labs: _0 (procalcitonin:4,lacticidven:4) ) Recent Results (from the past 240 hour(s))  Culture, respiratory (non-expectorated)     Status: None (Preliminary result)   Collection Time: 11/05/20 11:46 AM   Specimen: Tracheal Aspirate; Respiratory  Result Value Ref Range Status   Specimen Description   Final    TRACHEAL ASPIRATE Performed at [facility], [address], [place], [place] [address]    Special Requests   Final    NONE Performed at [facility], [address] [place], [place] [address]    Gram Stain   Final    MODERATE WBC PRESENT,BOTH PMN AND MONONUCLEAR FEW GRAM POSITIVE COCCI FEW GRAM NEGATIVE RODS RARE GRAM POSITIVE RODS    Culture   Final    CULTURE REINCUBATED FOR BETTER GROWTH Performed at [facility], [place] [address], [place],  [address]    Report Status PENDING  Incomplete     Scheduled Meds: . acetylcysteine  1 mL Nebulization QID  . albuterol  2.5 mg Nebulization QID  . chlorhexidine gluconate (MEDLINE KIT)  15 mL Mouth Rinse BID  . Chlorhexidine Gluconate Cloth  6 each Topical Daily  . cloNIDine  0.1 mg Per Tube TID  . feeding supplement (PROSource TF)  90 mL Per Tube TID  . free water  400 mL Per Tube Q4H  . mouth rinse  15 mL Mouth Rinse 2 times per day  . metoprolol tartrate  12.5 mg Per Tube BID  . pantoprazole sodium  40 mg Per Tube Q1200  . sodium chloride flush  3 mL Intravenous Q12H   Continuous Infusions: . sodium chloride    . sodium chloride 250 mL (11/02/20 1546)  . amiodarone    . dexmedetomidine (PRECEDEX) IV infusion 0.8 mcg/kg/hr (11/07/20 1511)  . feeding supplement (VITAL HIGH PROTEIN) 1,000 mL (11/07/20 1430)  . heparin 2,450  Units/hr (11/07/20 1511)    Procedures/Studies: US RENAL  Result Date: 10/28/2020 CLINICAL DATA:  Chronic kidney disease.  Acute kidney injury. EXAM: RENAL / URINARY TRACT ULTRASOUND COMPLETE COMPARISON:  CT 06/12/2018 FINDINGS: Right Kidney: Renal measurements: 11.0 x 6.4 x 5.2 cm = volume: 190 mL. Increased echogenicity. No focal lesion or hydronephrosis. Left Kidney: Renal measurements: 11.2 x 7.6 x 6.2 cm = volume: 276 mL. Increased echogenicity. No focal lesion or hydronephrosis. Bladder: Foley catheter within the bladder. Other: None. IMPRESSION: Slightly enlarged kidneys showing increased echogenicity. Findings could be due to chronic diabetic glomerulonephropathy or acute nephritis. No obstruction or focal lesion. Electronically Signed   By: Nelson Chimes M.D.   On: 10/28/2020 14:30   DG Chest Port 1 View  Result Date: 11/05/2020 CLINICAL DATA:  Respiratory failure EXAM: PORTABLE CHEST 1 VIEW COMPARISON:  11/02/2020 FINDINGS: Tracheostomy and NG tube are unchanged. Cardiomegaly. Mild vascular congestion. Bilateral airspace opacities again noted, worse at the left base. This is stable since prior study. No visible effusions or pneumothorax. IMPRESSION: Cardiomegaly. Bilateral airspace disease, most pronounced in the left base, stable since  prior study. Electronically Signed   By: Rolm Baptise M.D.   On: 11/05/2020 08:03   DG CHEST PORT 1 VIEW  Result Date: 11/02/2020 CLINICAL DATA:  NG tube placement. EXAM: PORTABLE CHEST 1 VIEW COMPARISON:  Radiograph yesterday. FINDINGS: The tip of the enteric tube is below the diaphragm, not included in the field of view. The side-port is not well visualized but likely below the diaphragm. Stable cardiomegaly. Patchy airspace opacities in both lung bases, unchanged. IMPRESSION: 1. Tip of the enteric tube below the diaphragm, not included in the field of view. 2. Unchanged patchy bibasilar airspace opacities. Electronically Signed   By: Keith Rake M.D.    On: 11/02/2020 21:51   DG CHEST PORT 1 VIEW  Result Date: 11/01/2020 CLINICAL DATA:  Acute on chronic respiratory failure. EXAM: PORTABLE CHEST 1 VIEW COMPARISON:  10/31/2020 FINDINGS: The tracheostomy tube is stable. The NG tube is stable. Much improved left lung aeration when compared to the prior study. The left upper lobe is now aerated. Persistent airspace process in the right lung and at the left lung base. No pleural effusions or pneumothorax. IMPRESSION: 1. Much improved left lung aeration when compared to the prior study. 2. Persistent right lung and left basilar airspace process. Electronically Signed   By: Marijo Sanes M.D.   On: 11/01/2020 05:55   DG CHEST PORT 1 VIEW  Result Date: 10/31/2020 CLINICAL DATA:  Acute on chronic respiratory EXAM: PORTABLE CHEST 1 VIEW COMPARISON:  Failure four days ago FINDINGS: Enteric tube which reaches the stomach. Tracheostomy tube in place. Interval white out of the left chest with volume loss. Progressive hazy opacification of the right lower chest. Heart size is largely obscured. No visible air leak. IMPRESSION: 1. Interval white out of the left chest, likely plugging and pulmonary collapse. 2. Progressive airspace disease or pleural effusion on the right. Electronically Signed   By: Monte Fantasia M.D.   On: 10/31/2020 04:22   DG Chest Port 1 View  Result Date: 10/27/2020 CLINICAL DATA:  Acute respiratory failure EXAM: PORTABLE CHEST 1 VIEW COMPARISON:  Yesterday FINDINGS: Enteric and tracheostomy tubes remain in good position. Cardiomegaly. Interstitial opacity which could be bronchitic or congestive. There is improved aeration at the right base. Negative for pneumothorax. IMPRESSION: 1. Improved aeration. 2. Bronchitic or congestive interstitial opacity. Electronically Signed   By: Monte Fantasia M.D.   On: 10/27/2020 07:46   DG Chest Port 1 View  Result Date: 10/26/2020 CLINICAL DATA:  Shortness of breath for 2-3 days EXAM: PORTABLE CHEST  1 VIEW COMPARISON:  Three days ago FINDINGS: Progressive hazy appearance of the right mid to lower chest. Dense retrocardiac opacity persists. An enteric tube and tracheostomy tube remain in place. Cardiomegaly and vascular pedicle widening. IMPRESSION: Pleural fluid/pulmonary opacification at the bases which has progressed from 3 days ago. Electronically Signed   By: Monte Fantasia M.D.   On: 10/26/2020 06:02   DG CHEST PORT 1 VIEW  Result Date: 10/23/2020 CLINICAL DATA:  Shortness of breath. EXAM: PORTABLE CHEST 1 VIEW COMPARISON:  October 22, 2020 FINDINGS: The near complete opacification of the left hemithorax seen on the comparison chest x-ray has resolved. Persistent bibasilar opacities are likely atelectasis. No pneumothorax. A tracheostomy tube is in good position. An NG tube terminates in the stomach. No other acute abnormalities. IMPRESSION: 1. Support apparatus as above. 2. Bibasilar atelectasis and a possible small left effusion. Improved aeration of the left lung. Electronically Signed   By: Dorise Bullion III  M.D   On: 10/23/2020 14:57   DG Chest Port 1 View  Result Date: 10/22/2020 CLINICAL DATA:  Acute respiratory failure. EXAM: PORTABLE CHEST 1 VIEW COMPARISON:  10/20/2020 FINDINGS: Tracheostomy tube overlies the airway. The cardiac silhouette is enlarged. There is persistent mild central pulmonary vascular congestion without overt edema. Airspace opacities in the left greater than right mid and lower lungs have slightly improved. Small pleural effusions are questioned. No pneumothorax is identified. IMPRESSION: Slight improvement of bilateral airspace opacities which could reflect pneumonia or atelectasis. Electronically Signed   By: [name] M.D.   On: 10/22/2020 09:16   DG Chest Port 1 View  Result Date: 10/20/2020 CLINICAL DATA:  Questionable sepsis EXAM: PORTABLE CHEST 1 VIEW COMPARISON:  Chest x-ray 06/12/2018 report without images. FINDINGS: Tracheostomy tube terminates  approximately 7.5 cm above the carina. The heart size and mediastinal contours are not well visualized. Bilateral hilar vasculature prominence. Aortic arch calcifications. Silhouetting off of the left hemidiaphragm and left cardiac border. Partial silhouetting off the right cardiac border and right hemidiaphragm. Increased interstitial markings. Likely at least trace to small volume left pleural effusion. No pneumothorax. No acute osseous abnormality. IMPRESSION: 1. Left lung opacity and likely at least trace to small volume left pleural effusion with silhouetting off of the left diaphragm and heart border. Concern for infection/inflammation. 2. Right lower lobe and middle lobe airspace opacity could represent a combination of atelectasis, infection, inflammation. 3. At least mild pulmonary edema. 4. Consider chest x-ray PA and lateral view for further evaluation. Electronically Signed   By: Iven Finn M.D.   On: 10/20/2020 12:26   DG Chest Port 1V same Day  Result Date: 10/22/2020 CLINICAL DATA:  Status post NG tube placement. EXAM: PORTABLE CHEST 1 VIEW COMPARISON:  Single-view of the chest earlier today. FINDINGS: The upper chest is off the margin of the film. NG tube is looped in the upper trachea or esophagus. Visualized left chest is completely whited out on this study. IMPRESSION: NG tube is looped in the upper esophagus or trachea. Complete whiteout of the left chest is new since the study earlier today could be due to mucous plugging and left lung collapse or rapidly developing pleural effusion and airspace disease. Electronically Signed   By: [name] M.D.   On: 10/22/2020 15:16   DG Abd Portable 1V  Result Date: 10/22/2020 CLINICAL DATA:  Nasogastric tube placement EXAM: PORTABLE ABDOMEN - 1 VIEW COMPARISON:  Portable exam 1613 hours compared to 1523 hours FINDINGS: Tip of nasogastric tube projects over distal gastric antrum, consider withdrawal 5 cm. Bowel gas pattern normal.  Opacification of the inferior LEFT hemithorax again seen. IMPRESSION: Tip of nasogastric tube projects over distal gastric antrum. Consider withdrawal of nasogastric tube 5 cm. Electronically Signed   By: [name] M.D.   On: 10/22/2020 16:36   DG Abd Portable 1V  Result Date: 10/22/2020 CLINICAL DATA:  Nasogastric tube placement EXAM: PORTABLE ABDOMEN - 1 VIEW COMPARISON:  None. FINDINGS: Nasogastric tube extends into the gastric cardia where it loops upon itself with the tip at the junction of the mid and distal thirds of the esophagus. There is no bowel dilatation or air-fluid level to suggest bowel obstruction. No free air. There is consolidation left lower lobe. IMPRESSION: Nasogastric tube extends into the stomach where it loops upon itself with the tip at the junction of the mid and distal thirds of the esophagus. No bowel obstruction or free air evident. Consolidation left lower lobe. These  results will be called to the ordering clinician or representative by the Radiologist Assistant, and communication documented in the PACS or [employer]. Electronically Signed   By: Lowella Grip III M.D.   On: 10/22/2020 15:34   ECHOCARDIOGRAM COMPLETE  Result Date: 10/21/2020    ECHOCARDIOGRAM REPORT   Patient Name:   KEONI RISINGER Date of Exam: 10/21/2020 Medical Rec #:  [id number]        Height:       68.0 in Accession #:    [id number]       Weight:       325.8 lb Date of Birth:  1965/12/12        BSA:          2.515 m Patient Age:    67 years         BP:           112/61 mmHg Patient Gender: M                HR:           51 bpm. Exam Location:  [facility] Procedure: 2D Echo Indications:    CHF-Acute Diastolic E95.28  History:        Patient has no prior history of Echocardiogram examinations.                 CHF; Risk Factors:Non-Smoker. Acute on chronic respiratory                 failure with hypoxia.  Sonographer:    Leavy Cella RDCS (AE) Referring Phys: [phone] DAVID TAT IMPRESSIONS  1.  Left ventricular ejection fraction, by estimation, is 60 to 65%. The left ventricle has normal function. The left ventricle has no regional wall motion abnormalities. There is severe left ventricular hypertrophy. Left ventricular diastolic parameters  are indeterminate. There is the interventricular septum is flattened in systole and diastole, consistent with right ventricular pressure and volume overload.  2. Right ventricular systolic function is mildly reduced. The right ventricular size is mildly enlarged. Tricuspid regurgitation signal is inadequate for assessing PA pressure.  3. Right atrial size was upper normal.  4. The pericardial effusion is posterior to the left ventricle.  5. The mitral valve is grossly normal. Trivial mitral valve regurgitation.  6. The aortic valve is tricuspid. Aortic valve regurgitation is not visualized.  7. The inferior vena cava is normal in size with greater than 50% respiratory variability, suggesting right atrial pressure of 3 mmHg. FINDINGS  Left Ventricle: Left ventricular ejection fraction, by estimation, is 60 to 65%. The left ventricle has normal function. The left ventricle has no regional wall motion abnormalities. The left ventricular internal cavity size was normal in size. There is  severe left ventricular hypertrophy. The interventricular septum is flattened in systole and diastole, consistent with right ventricular pressure and volume overload. Left ventricular diastolic parameters are indeterminate. Right Ventricle: The right ventricular size is mildly enlarged. No increase in right ventricular wall thickness. Right ventricular systolic function is mildly reduced. Tricuspid regurgitation signal is inadequate for assessing PA pressure. Left Atrium: Left atrial size was normal in size. Right Atrium: Right atrial size was upper normal. Pericardium: Trivial pericardial effusion is present. The pericardial effusion is posterior to the left ventricle. Mitral Valve: The  mitral valve is grossly normal. Trivial mitral valve regurgitation. Tricuspid Valve: The tricuspid valve is grossly normal. Tricuspid valve regurgitation is mild. Aortic Valve: The aortic valve is tricuspid. There is moderate aortic valve  annular calcification. Aortic valve regurgitation is not visualized. Pulmonic Valve: The pulmonic valve was grossly normal. Pulmonic valve regurgitation is trivial. Aorta: The aortic root is normal in size and structure. Venous: The inferior vena cava is normal in size with greater than 50% respiratory variability, suggesting right atrial pressure of 3 mmHg. IAS/Shunts: No atrial level shunt detected by color flow Doppler.  LEFT VENTRICLE PLAX 2D LVIDd:         4.66 cm  Diastology LVIDs:         2.03 cm  LV e' medial:    5.87 cm/s LV PW:         1.76 cm  LV E/e' medial:  14.0 LV IVS:        1.73 cm  LV e' lateral:   8.49 cm/s LVOT diam:     1.90 cm  LV E/e' lateral: 9.7 LVOT Area:     2.84 cm  RIGHT VENTRICLE RV S prime:     19.40 cm/s TAPSE (M-mode): 1.9 cm LEFT ATRIUM             Index       RIGHT ATRIUM           Index LA diam:        3.50 cm 1.39 cm/m  RA Area:     24.60 cm LA Vol (A2C):   43.9 ml 17.46 ml/m RA Volume:   82.30 ml  32.73 ml/m LA Vol (A4C):   43.8 ml 17.42 ml/m LA Biplane Vol: 44.4 ml 17.66 ml/m   AORTA Ao Root diam: 3.10 cm MITRAL VALVE MV Area (PHT): 2.64 cm    SHUNTS MV Decel Time: 287 msec    Systemic Diam: 1.90 cm MV E velocity: 82.30 cm/s MV A velocity: 59.10 cm/s MV E/A ratio:  1.39 Rozann Lesches MD Electronically signed by Rozann Lesches MD Signature Date/Time: 10/21/2020/4:33:43 PM    Final     Roxan Hockey, MD  [facility]  If 7PM-7AM, please contact night-coverage [email or web address] Password [facility] 11/07/2020, 4:35 PM   LOS: 18 days

## 2020-11-07 NOTE — Progress Notes (Signed)
ANTICOAGULATION CONSULT NOTE  Pharmacy Consult:  Heparin Indication: atrial fibrillation  No Known Allergies  Patient Measurements: Height: 5\' 7"  (170.2 cm) Weight: (!) 139.3 kg (307 lb 1.6 oz) IBW/kg (Calculated) : 66.1 HEPARIN DW (KG): 103.8  Vital Signs: Temp: 99.5 F (37.5 C) (01/02 0721) Temp Source: Oral (01/02 0721) BP: 125/63 (01/02 0700) Pulse Rate: 65 (01/02 0721)  Labs: Recent Labs    11/05/20 0616 11/06/20 0408 11/07/20 0453  HGB 11.4* 11.6* 11.9*  HCT 39.1 39.2 40.1  PLT 228 261 277  HEPARINUNFRC 0.34 0.26* 0.30  CREATININE 2.24* 2.06* 1.98*    Estimated Creatinine Clearance: 57.6 mL/min (A) (by C-G formula based on SCr of 1.98 mg/dL (H)).  Assessment: Pharmacy consulted to dose heparin infusion for this 55 yo male with atrial fibrillation.  Patient was  taking apixaban PTA and was on treatment-dose Lovenox from 12-17 to 12-20. Heparin is being started due to patient's unstable renal function.   Goal of Therapy:  Heparin level 0.3-0.7 units/ml Monitor platelets by anticoagulation protocol: Yes   11/07/20 0900 update:  HL: 0.30 IU/mL-->below goal range on heparin at 2300 units/hr CBC: Hb 11.4 >11.6>11.9          Plates: 177>199>228>261>267  RN reports no bleeding complication or issues with infusion site   Plan:  Increase  heparin infusion rate to  2450 units/hr Daily heparin level and CBC Monitor for signs and symptoms of bleeding F/U transition to oral anti-coagulant    Despina Pole, Pharm. D. Clinical Pharmacist 11/07/2020 8:43 AM

## 2020-11-07 NOTE — Progress Notes (Signed)
Patient's BP increasing despite administration of BP medication. No weaning attempted today due BP issues.

## 2020-11-08 ENCOUNTER — Inpatient Hospital Stay (HOSPITAL_COMMUNITY): Payer: Medicare Other

## 2020-11-08 DIAGNOSIS — E662 Morbid (severe) obesity with alveolar hypoventilation: Secondary | ICD-10-CM | POA: Diagnosis not present

## 2020-11-08 DIAGNOSIS — J9621 Acute and chronic respiratory failure with hypoxia: Secondary | ICD-10-CM | POA: Diagnosis not present

## 2020-11-08 DIAGNOSIS — Z930 Tracheostomy status: Secondary | ICD-10-CM | POA: Diagnosis not present

## 2020-11-08 DIAGNOSIS — G4733 Obstructive sleep apnea (adult) (pediatric): Secondary | ICD-10-CM | POA: Diagnosis not present

## 2020-11-08 LAB — RENAL FUNCTION PANEL
Albumin: 2.4 g/dL — ABNORMAL LOW (ref 3.5–5.0)
Anion gap: 8 (ref 5–15)
BUN: 91 mg/dL — ABNORMAL HIGH (ref 6–20)
CO2: 28 mmol/L (ref 22–32)
Calcium: 10.2 mg/dL (ref 8.9–10.3)
Chloride: 101 mmol/L (ref 98–111)
Creatinine, Ser: 1.84 mg/dL — ABNORMAL HIGH (ref 0.61–1.24)
GFR, Estimated: 43 mL/min — ABNORMAL LOW (ref >60)
Glucose, Bld: 127 mg/dL — ABNORMAL HIGH (ref 70–99)
Phosphorus: 4.6 mg/dL (ref 2.5–4.6)
Potassium: 4.1 mmol/L (ref 3.5–5.1)
Sodium: 137 mmol/L (ref 135–145)

## 2020-11-08 LAB — CULTURE, RESPIRATORY W GRAM STAIN: Culture: NORMAL

## 2020-11-08 LAB — CBC
HCT: 40.1 % (ref 39.0–52.0)
Hemoglobin: 12 g/dL — ABNORMAL LOW (ref 13.0–17.0)
MCH: 29.4 pg (ref 26.0–34.0)
MCHC: 29.9 g/dL — ABNORMAL LOW (ref 30.0–36.0)
MCV: 98.3 fL (ref 80.0–100.0)
Platelets: 255 10*3/uL (ref 150–400)
RBC: 4.08 MIL/uL — ABNORMAL LOW (ref 4.22–5.81)
RDW: 14.2 % (ref 11.5–15.5)
WBC: 4.9 10*3/uL (ref 4.0–10.5)
nRBC: 0 % (ref 0.0–0.2)

## 2020-11-08 LAB — GLUCOSE, CAPILLARY
Glucose-Capillary: 105 mg/dL — ABNORMAL HIGH (ref 70–99)
Glucose-Capillary: 108 mg/dL — ABNORMAL HIGH (ref 70–99)
Glucose-Capillary: 109 mg/dL — ABNORMAL HIGH (ref 70–99)
Glucose-Capillary: 120 mg/dL — ABNORMAL HIGH (ref 70–99)
Glucose-Capillary: 122 mg/dL — ABNORMAL HIGH (ref 70–99)
Glucose-Capillary: 133 mg/dL — ABNORMAL HIGH (ref 70–99)
Glucose-Capillary: 91 mg/dL (ref 70–99)

## 2020-11-08 LAB — HEPARIN LEVEL (UNFRACTIONATED): Heparin Unfractionated: 0.34 IU/mL (ref 0.30–0.70)

## 2020-11-08 MED ORDER — ACETAMINOPHEN 325 MG PO TABS: 650.0000 mg | ORAL_TABLET | ORAL | Status: DC | PRN

## 2020-11-08 NOTE — Progress Notes (Addendum)
PROGRESS NOTE  Jack Cox ZDG:[id number] DOB: [birth year] DOA: 10/20/2020 PCP: [name], MD  Brief History: 55 y.o.malewith medical history ofdiastolic CHF, atrial flutter, obstructive sleep apnea, hypertension, right bundle branch block presenting with shortness of breath for the last 2 to 3 days. Unfortunately, the patient is encephalopathic at the time of my evaluation; therefore, history is limited. Apparently, the patient went to see his nephrologist earlier on 10/20/2020. He was noted to have oxygen saturation in the 60s. As result, he was sent to the emergency department for further evaluation. Initially, the patient was awake and conversant with oxygen saturation in the low 70s. He was placed on a nonrebreather after which the patient became obtunded. VBG showed pH 7.1 66/97/106 on 80%. Subsequently, the patient was placed on the ventilator with PRVC,PEEP of 5, FiO2 70%. After approximately 10 to 15 minutes on the ventilator, the patient remained somnolent, but awakened and was able to tell me his name. He did state that he has not been taking his torsemide as directed. In addition, the patient has been sleeping in a recliner for an unclear duration of time secondary to shortness of breath. At baseline, the patient has a size 4 noncuffed tracheostomy. He is on supplemental oxygen 2.5 L during the day and 4 L at nighttime. There is no reports of nausea, vomiting, diarrhea, chest pain, abdominal pain. In the emergency department, the patient was afebrile and hemodynamically stable with oxygen saturation 93-94% on the ventilator with FiO2 70%. Chest x-ray showed left-sided pleural effusion with increased interstitial markings. BNP 790. PCT 0.12. BMP showed a serum creatinine 2.43. LFTs were unremarkable. WBC 8.1, hemoglobin 14.5, platelets 253,000. The patient was given ceftriaxone and azithromycin. He was also given furosemide 80 mg IV x1. COVID-19 RT-PCR  negative. He was continued on IV lasix 80 mg bid with uptrending of his serum creatinine. Renal was consulted to assist.Pulmonary was also consulted to assist also.  -- Acute on chronic hypoxic/hypercapnic respiratory failure. -OSA/OHS s/p tracheostomy. - trach collar as tolerated with vent support as needed - trach care - f/u CXR intermittently - goal SpO2 90 to 95%  Recurrent mucus plugging. -  Sedation needs. - precedex with prn fentanyl/ativan for RASS goal 0 to -1  Acute on chronic diastolic CHF. Atrial flutter. CKD 4. - per primary team  Dysphagia. - primary team arranging for PEG with IR  Assessment/Plan: Acute on chronic respiratory failure with hypoxia and hypercarbia -OSA/OHS s/p tracheostomy. -Secondary to CHF in the setting of OSA -remainson ventilator with trachwith trach collar trials during day, still requires vent at night and requires vent as needed during the day -PCCM input appreciated -having recurrent mucus plugging--needs aggressive suctioning and  mucomyst, proventil -10/26/20-bronchoscopy--Thick inspissated mucus in proximal airways & BL lower lobes, removed with suctioning --wean with PSV and PCV rest mode   Acute on chronic diastolic CHF/Cor Pulmonale -06/25/2019 echo EF 55%, trivial TR -12/16/21echo--EF 60-65%, no WMA, RV pressure overload, mild TR -Accurate I's and O's--NEG so far -Daily weights  Atrial flutter- controlled -apixabantemporarilyon hold, he is on an IV heparin infusionfor full anticoagulation-may switch to Lovenox when renal function improves further -resumed low dose metoprolol 12.5 mg BID per tube   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 -Baseline creatinine 2.4-2.7 -Creatinine peaked above 4 this admission,  creatinine currently below 2 -Monitor with diuresis -consult nephrology--appreciated-->albumin+lasix bid per renal -renal signed off 12/29  FEN/dysphagia -had mild hypoglycemia -NG placed  12/17 -switchedto Vital  High Protein_0 /hr-->hypoglycemia improved --request IR consult for PEG --IR would like to get a CT abdomen and pelvis on review anatomy prior to making decision on possibility of PEG tube by IR  Hypernatremia- resolved now after adding more free water to feeding tuberegimen.   Fever - improving with pneumonia management -blood cultures x 2--neg to date -UA and urine culture--no significant pyuria -12/16 trach aspirate-->flora -10/26/20-->BAL sent for culture -completed course of cefepime12/27 -resolved  Essential hypertension -overall stable -metoprolol low dose with holding parameters -clonidine initiated by pulm  OSA/-OSA/OHS s/p tracheostomy. -He has a size 4 cuffless trach  Right bundle branch block -This has been chronic with review of the medical records  Morbid obesity -BMI 52.94 -lifestyle modification  GOC -palliative following -currently full scope of care  Anticoagulation--continue IV heparin to allow for PEG tube placement, if renal function continues to improve after PEG tube placement may transition to subcu Lovenox or Eliquis pending renal function  LTAC--- discharge criteria--- LTAC request that prior to being accepted patient will have to tolerate trach collar well, patient will have to have the PEG tube for feeding properly, patient will have to be of IV heparin  Status is: Inpatient  Remains inpatient appropriate because:IV treatments appropriate due to intensity of illness or inability to take PO  Dispo: The patient is from:Home Anticipated d/c is OI:[facility] Anticipated d/c date is: >3 days Patient currently is not medically stable to d/c.   Family Communication:brotherat bedside on 11/07/2020  Consultants:pulm, renal, IR  Code Status: FULL  DVT Prophylaxis: IV heparin infusion for full anticoagulation   Procedures: As Listed in Progress Note  Above  Antibiotics: Cefepime 12/21>>12/27       Subjective: - Patient had episodes of desaturation, was placed back on vent during the day today No fevers -Tolerating tube feed well Objective: Vitals:   11/08/20 1300 11/08/20 1345 11/08/20 1400 11/08/20 1502  BP: (!) 166/71  (!) 173/71   Pulse: 73  63   Resp: (!) 21  (!) 21   Temp:      TempSrc:      SpO2: (!) 86% 92% 92% 93%  Weight:      Height:        Intake/Output Summary (Last 24 hours) at 11/08/2020 1818 Last data filed at 11/08/2020 1402 Gross per 24 hour  Intake 1264.91 ml  Output 3275 ml  Net -2010.09 ml   Weight change:  Exam:   General:  Pt is alert, follows commands appropriately, not in acute distress  HEENT: Tracheostomy  cardiovascular: RRR, S1/S2, no rubs, no gallops  Respiratory: bilateral rhonchi, air movement is fair and symmetrical  Abdomen: Soft/+BS, non tender, non distended, no guarding  Extremities: 1+LE edema, No lymphangitis, No petechiae, No rashes, no synovitis  Psych-appears appropriate overall   Data Reviewed: I have personally reviewed following labs and imaging studies Basic Metabolic Panel: Recent Labs  Lab 11/04/20 0519 11/05/20 0616 11/06/20 0408 11/07/20 0453 11/08/20 0246  NA 143 141 138 137 137  K 4.2 4.2 4.2 4.4 4.1  CL 105 105 103 101 101  CO2 _1 GLUCOSE 136* 129* 126* 127* 127*  BUN 103* 98* 92* 93* 91*  CREATININE 2.52* 2.24* 2.06* 1.98* 1.84*  CALCIUM 9.9 10.1 10.4* 10.2 10.2  PHOS 5.0* 4.9* 4.1 5.3* 4.6   Liver Function Tests: Recent Labs  Lab 11/04/20 0519 11/05/20 0616 11/06/20 0408 11/07/20 0453 11/08/20 0246  ALBUMIN 2.3* 2.3* 2.3* 2.5* 2.4*   No results for  input(s): LIPASE, AMYLASE in the last 168 hours. No results for input(s): AMMONIA in the last 168 hours. Coagulation Profile: No results for input(s): INR, PROTIME in the last 168 hours. CBC: Recent Labs  Lab 11/04/20 0519 11/05/20 0616 11/06/20 0408  11/07/20 0453 11/08/20 0246  WBC 5.9 5.8 7.3 6.1 4.9  HGB 11.1* 11.4* 11.6* 11.9* 12.0*  HCT 39.2 39.1 39.2 40.1 40.1  MCV 101.6* 101.3* 99.0 99.3 98.3  PLT 199 228 261 277 255   Cardiac Enzymes: No results for input(s): CKTOTAL, CKMB, CKMBINDEX, TROPONINI in the last 168 hours. BNP: Invalid input(s): POCBNP CBG: Recent Labs  Lab 11/08/20 0034 11/08/20 0514 11/08/20 0757 11/08/20 1155 11/08/20 1651  GLUCAP 109* 108* 133* 122* 120*   HbA1C: No results for input(s): HGBA1C in the last 72 hours. Urine analysis:    Component Value Date/Time   COLORURINE YELLOW 10/28/2020 0429   APPEARANCEUR HAZY (A) 10/28/2020 0429   LABSPEC 1.014 10/28/2020 0429   PHURINE 5.0 10/28/2020 0429   GLUCOSEU NEGATIVE 10/28/2020 0429   HGBUR SMALL (A) 10/28/2020 0429   BILIRUBINUR NEGATIVE 10/28/2020 0429   KETONESUR NEGATIVE 10/28/2020 0429   PROTEINUR 100 (A) 10/28/2020 0429   NITRITE NEGATIVE 10/28/2020 0429   LEUKOCYTESUR NEGATIVE 10/28/2020 0429   Sepsis Labs: _0 (procalcitonin:4,lacticidven:4) ) Recent Results (from the past 240 hour(s))  Culture, respiratory (non-expectorated)     Status: None   Collection Time: 11/05/20 11:46 AM   Specimen: Tracheal Aspirate; Respiratory  Result Value Ref Range Status   Specimen Description   Final    TRACHEAL ASPIRATE Performed at [facility], [address], [place], [place] [address]    Special Requests   Final    NONE Performed at [facility], [address], [place], [place] [address]    Gram Stain   Final    MODERATE WBC PRESENT,BOTH PMN AND MONONUCLEAR FEW GRAM POSITIVE COCCI FEW GRAM NEGATIVE RODS RARE GRAM POSITIVE RODS    Culture   Final    FEW Normal respiratory flora-no Staph aureus or Pseudomonas seen Performed at [facility], [address] [place], [place] [address]    Report Status 11/08/2020 FINAL  Final     Scheduled Meds: . acetylcysteine  1 mL Nebulization QID  . albuterol  2.5 mg Nebulization QID   . chlorhexidine gluconate (MEDLINE KIT)  15 mL Mouth Rinse BID  . Chlorhexidine Gluconate Cloth  6 each Topical Daily  . cloNIDine  0.1 mg Per Tube TID  . feeding supplement (PROSource TF)  90 mL Per Tube TID  . free water  400 mL Per Tube Q4H  . mouth rinse  15 mL Mouth Rinse 2 times per day  . metoprolol tartrate  12.5 mg Per Tube BID  . pantoprazole sodium  40 mg Per Tube Q1200  . sodium chloride flush  3 mL Intravenous Q12H   Continuous Infusions: . sodium chloride    . sodium chloride 250 mL (11/02/20 1546)  . amiodarone    . dexmedetomidine (PRECEDEX) IV infusion 1 mcg/kg/hr (11/08/20 1652)  . feeding supplement (VITAL HIGH PROTEIN) 1,000 mL (11/07/20 1430)  . heparin 2,450 Units/hr (11/08/20 1653)    Procedures/Studies: CT ABDOMEN WO CONTRAST  Result Date: 11/08/2020 CLINICAL DATA:  55 year old male undergoing anatomic evaluation for possible percutaneous gastrostomy tube placement. EXAM: CT ABDOMEN WITHOUT CONTRAST TECHNIQUE: Multidetector CT imaging of the abdomen was performed following the standard protocol without IV contrast. COMPARISON:  Prior CT scan of the abdomen and pelvis 06/12/2018 FINDINGS:  Lower chest: Small right and trace left pleural effusions. Small 0.4 cm nodule in the right middle lobe, unchanged compared to prior imaging dating back to 2019 and therefore benign. Combination of volume loss/atelectasis and consolidative changes present in both lower lobes. Cardiomegaly. No pericardial effusion. Calcifications present along the coronary arteries. Hepatobiliary: Nodular hepatic contour with relative hypertrophy of the left hepatic lobe. These findings are morphologically compatible with cirrhosis. No definite discrete mass within the limitations of noncontrast enhanced technique. Gallbladder is unremarkable. No intra or extrahepatic biliary ductal dilatation. Pancreas: Unremarkable. No pancreatic ductal dilatation or surrounding inflammatory changes. Spleen: Normal in  size without focal abnormality. Adrenals/Urinary Tract: Unremarkable adrenal glands. Nonspecific perinephric stranding bilaterally. No hydronephrosis or nephrolithiasis. Stomach/Bowel: Gastric tube terminates in the stomach. Normal anatomic position of the stomach without evidence of colonic interposition or other complicating feature. Colonic diverticular disease without CT evidence of active inflammation. Vascular/Lymphatic: Limited evaluation in the absence of intravenous contrast. Mild scattered atherosclerotic vascular calcifications. No aneurysm. No suspicious lymphadenopathy. Other: No evidence of ascites. Musculoskeletal: No acute fracture or aggressive appearing lytic or blastic osseous lesion. IMPRESSION: 1. Anatomy is suitable for percutaneous gastrostomy tube placement. 2. Small right and trace left pleural effusions with associated bibasilar atelectasis and consolidation which may reflect pneumonia or aspiration. 3. Cardiomegaly. 4. Coronary artery calcifications. 5. Hepatic cirrhosis. 6. Colonic diverticular disease without CT evidence of active inflammation. 7.  Aortic Atherosclerosis (ICD10-I70.0). Electronically Signed   By: Jacqulynn Cadet M.D.   On: 11/08/2020 15:46   US RENAL  Result Date: 10/28/2020 CLINICAL DATA:  Chronic kidney disease.  Acute kidney injury. EXAM: RENAL / URINARY TRACT ULTRASOUND COMPLETE COMPARISON:  CT 06/12/2018 FINDINGS: Right Kidney: Renal measurements: 11.0 x 6.4 x 5.2 cm = volume: 190 mL. Increased echogenicity. No focal lesion or hydronephrosis. Left Kidney: Renal measurements: 11.2 x 7.6 x 6.2 cm = volume: 276 mL. Increased echogenicity. No focal lesion or hydronephrosis. Bladder: Foley catheter within the bladder. Other: None. IMPRESSION: Slightly enlarged kidneys showing increased echogenicity. Findings could be due to chronic diabetic glomerulonephropathy or acute nephritis. No obstruction or focal lesion. Electronically Signed   By: Nelson Chimes M.D.   On:  10/28/2020 14:30   DG Chest Port 1 View  Result Date: 11/05/2020 CLINICAL DATA:  Respiratory failure EXAM: PORTABLE CHEST 1 VIEW COMPARISON:  11/02/2020 FINDINGS: Tracheostomy and NG tube are unchanged. Cardiomegaly. Mild vascular congestion. Bilateral airspace opacities again noted, worse at the left base. This is stable since prior study. No visible effusions or pneumothorax. IMPRESSION: Cardiomegaly. Bilateral airspace disease, most pronounced in the left base, stable since prior study. Electronically Signed   By: Rolm Baptise M.D.   On: 11/05/2020 08:03   DG CHEST PORT 1 VIEW  Result Date: 11/02/2020 CLINICAL DATA:  NG tube placement. EXAM: PORTABLE CHEST 1 VIEW COMPARISON:  Radiograph yesterday. FINDINGS: The tip of the enteric tube is below the diaphragm, not included in the field of view. The side-port is not well visualized but likely below the diaphragm. Stable cardiomegaly. Patchy airspace opacities in both lung bases, unchanged. IMPRESSION: 1. Tip of the enteric tube below the diaphragm, not included in the field of view. 2. Unchanged patchy bibasilar airspace opacities. Electronically Signed   By: Keith Rake M.D.   On: 11/02/2020 21:51   DG CHEST PORT 1 VIEW  Result Date: 11/01/2020 CLINICAL DATA:  Acute on chronic respiratory failure. EXAM: PORTABLE CHEST 1 VIEW COMPARISON:  10/31/2020 FINDINGS: The tracheostomy tube is stable. The NG tube is  stable. Much improved left lung aeration when compared to the prior study. The left upper lobe is now aerated. Persistent airspace process in the right lung and at the left lung base. No pleural effusions or pneumothorax. IMPRESSION: 1. Much improved left lung aeration when compared to the prior study. 2. Persistent right lung and left basilar airspace process. Electronically Signed   By: Marijo Sanes M.D.   On: 11/01/2020 05:55   DG CHEST PORT 1 VIEW  Result Date: 10/31/2020 CLINICAL DATA:  Acute on chronic respiratory EXAM: PORTABLE  CHEST 1 VIEW COMPARISON:  Failure four days ago FINDINGS: Enteric tube which reaches the stomach. Tracheostomy tube in place. Interval white out of the left chest with volume loss. Progressive hazy opacification of the right lower chest. Heart size is largely obscured. No visible air leak. IMPRESSION: 1. Interval white out of the left chest, likely plugging and pulmonary collapse. 2. Progressive airspace disease or pleural effusion on the right. Electronically Signed   By: Monte Fantasia M.D.   On: 10/31/2020 04:22   DG Chest Port 1 View  Result Date: 10/27/2020 CLINICAL DATA:  Acute respiratory failure EXAM: PORTABLE CHEST 1 VIEW COMPARISON:  Yesterday FINDINGS: Enteric and tracheostomy tubes remain in good position. Cardiomegaly. Interstitial opacity which could be bronchitic or congestive. There is improved aeration at the right base. Negative for pneumothorax. IMPRESSION: 1. Improved aeration. 2. Bronchitic or congestive interstitial opacity. Electronically Signed   By: Monte Fantasia M.D.   On: 10/27/2020 07:46   DG Chest Port 1 View  Result Date: 10/26/2020 CLINICAL DATA:  Shortness of breath for 2-3 days EXAM: PORTABLE CHEST 1 VIEW COMPARISON:  Three days ago FINDINGS: Progressive hazy appearance of the right mid to lower chest. Dense retrocardiac opacity persists. An enteric tube and tracheostomy tube remain in place. Cardiomegaly and vascular pedicle widening. IMPRESSION: Pleural fluid/pulmonary opacification at the bases which has progressed from 3 days ago. Electronically Signed   By: Monte Fantasia M.D.   On: 10/26/2020 06:02   DG CHEST PORT 1 VIEW  Result Date: 10/23/2020 CLINICAL DATA:  Shortness of breath. EXAM: PORTABLE CHEST 1 VIEW COMPARISON:  October 22, 2020 FINDINGS: The near complete opacification of the left hemithorax seen on the comparison chest x-ray has resolved. Persistent bibasilar opacities are likely atelectasis. No pneumothorax. A tracheostomy tube is in good  position. An NG tube terminates in the stomach. No other acute abnormalities. IMPRESSION: 1. Support apparatus as above. 2. Bibasilar atelectasis and a possible small left effusion. Improved aeration of the left lung. Electronically Signed   By: Dorise Bullion III M.D   On: 10/23/2020 14:57   DG Chest Port 1 View  Result Date: 10/22/2020 CLINICAL DATA:  Acute respiratory failure. EXAM: PORTABLE CHEST 1 VIEW COMPARISON:  10/20/2020 FINDINGS: Tracheostomy tube overlies the airway. The cardiac silhouette is enlarged. There is persistent mild central pulmonary vascular congestion without overt edema. Airspace opacities in the left greater than right mid and lower lungs have slightly improved. Small pleural effusions are questioned. No pneumothorax is identified. IMPRESSION: Slight improvement of bilateral airspace opacities which could reflect pneumonia or atelectasis. Electronically Signed   By: [name] M.D.   On: 10/22/2020 09:16   DG Chest Port 1 View  Result Date: 10/20/2020 CLINICAL DATA:  Questionable sepsis EXAM: PORTABLE CHEST 1 VIEW COMPARISON:  Chest x-ray 06/12/2018 report without images. FINDINGS: Tracheostomy tube terminates approximately 7.5 cm above the carina. The heart size and mediastinal contours are not well visualized. Bilateral hilar  vasculature prominence. Aortic arch calcifications. Silhouetting off of the left hemidiaphragm and left cardiac border. Partial silhouetting off the right cardiac border and right hemidiaphragm. Increased interstitial markings. Likely at least trace to small volume left pleural effusion. No pneumothorax. No acute osseous abnormality. IMPRESSION: 1. Left lung opacity and likely at least trace to small volume left pleural effusion with silhouetting off of the left diaphragm and heart border. Concern for infection/inflammation. 2. Right lower lobe and middle lobe airspace opacity could represent a combination of atelectasis, infection, inflammation. 3. At  least mild pulmonary edema. 4. Consider chest x-ray PA and lateral view for further evaluation. Electronically Signed   By: Iven Finn M.D.   On: 10/20/2020 12:26   DG Chest Port 1V same Day  Result Date: 10/22/2020 CLINICAL DATA:  Status post NG tube placement. EXAM: PORTABLE CHEST 1 VIEW COMPARISON:  Single-view of the chest earlier today. FINDINGS: The upper chest is off the margin of the film. NG tube is looped in the upper trachea or esophagus. Visualized left chest is completely whited out on this study. IMPRESSION: NG tube is looped in the upper esophagus or trachea. Complete whiteout of the left chest is new since the study earlier today could be due to mucous plugging and left lung collapse or rapidly developing pleural effusion and airspace disease. Electronically Signed   By: [name] M.D.   On: 10/22/2020 15:16   DG Abd Portable 1V  Result Date: 10/22/2020 CLINICAL DATA:  Nasogastric tube placement EXAM: PORTABLE ABDOMEN - 1 VIEW COMPARISON:  Portable exam 1613 hours compared to 1523 hours FINDINGS: Tip of nasogastric tube projects over distal gastric antrum, consider withdrawal 5 cm. Bowel gas pattern normal. Opacification of the inferior LEFT hemithorax again seen. IMPRESSION: Tip of nasogastric tube projects over distal gastric antrum. Consider withdrawal of nasogastric tube 5 cm. Electronically Signed   By: [name] M.D.   On: 10/22/2020 16:36   DG Abd Portable 1V  Result Date: 10/22/2020 CLINICAL DATA:  Nasogastric tube placement EXAM: PORTABLE ABDOMEN - 1 VIEW COMPARISON:  None. FINDINGS: Nasogastric tube extends into the gastric cardia where it loops upon itself with the tip at the junction of the mid and distal thirds of the esophagus. There is no bowel dilatation or air-fluid level to suggest bowel obstruction. No free air. There is consolidation left lower lobe. IMPRESSION: Nasogastric tube extends into the stomach where it loops upon itself with the tip at the  junction of the mid and distal thirds of the esophagus. No bowel obstruction or free air evident. Consolidation left lower lobe. These results will be called to the ordering clinician or representative by the Radiologist Assistant, and communication documented in the PACS or [employer]. Electronically Signed   By: Lowella Grip III M.D.   On: 10/22/2020 15:34   ECHOCARDIOGRAM COMPLETE  Result Date: 10/21/2020    ECHOCARDIOGRAM REPORT   Patient Name:   ERYCK NEGRON Date of Exam: 10/21/2020 Medical Rec #:  [id number]        Height:       68.0 in Accession #:    [id number]       Weight:       325.8 lb Date of Birth:  03/09/1966        BSA:          2.515 m Patient Age:    63 years         BP:  112/61 mmHg Patient Gender: M                HR:           51 bpm. Exam Location:  [facility] Procedure: 2D Echo Indications:    CHF-Acute Diastolic J49.70  History:        Patient has no prior history of Echocardiogram examinations.                 CHF; Risk Factors:Non-Smoker. Acute on chronic respiratory                 failure with hypoxia.  Sonographer:    Leavy Cella RDCS (AE) Referring Phys: [phone] DAVID TAT IMPRESSIONS  1. Left ventricular ejection fraction, by estimation, is 60 to 65%. The left ventricle has normal function. The left ventricle has no regional wall motion abnormalities. There is severe left ventricular hypertrophy. Left ventricular diastolic parameters  are indeterminate. There is the interventricular septum is flattened in systole and diastole, consistent with right ventricular pressure and volume overload.  2. Right ventricular systolic function is mildly reduced. The right ventricular size is mildly enlarged. Tricuspid regurgitation signal is inadequate for assessing PA pressure.  3. Right atrial size was upper normal.  4. The pericardial effusion is posterior to the left ventricle.  5. The mitral valve is grossly normal. Trivial mitral valve regurgitation.  6. The aortic  valve is tricuspid. Aortic valve regurgitation is not visualized.  7. The inferior vena cava is normal in size with greater than 50% respiratory variability, suggesting right atrial pressure of 3 mmHg. FINDINGS  Left Ventricle: Left ventricular ejection fraction, by estimation, is 60 to 65%. The left ventricle has normal function. The left ventricle has no regional wall motion abnormalities. The left ventricular internal cavity size was normal in size. There is  severe left ventricular hypertrophy. The interventricular septum is flattened in systole and diastole, consistent with right ventricular pressure and volume overload. Left ventricular diastolic parameters are indeterminate. Right Ventricle: The right ventricular size is mildly enlarged. No increase in right ventricular wall thickness. Right ventricular systolic function is mildly reduced. Tricuspid regurgitation signal is inadequate for assessing PA pressure. Left Atrium: Left atrial size was normal in size. Right Atrium: Right atrial size was upper normal. Pericardium: Trivial pericardial effusion is present. The pericardial effusion is posterior to the left ventricle. Mitral Valve: The mitral valve is grossly normal. Trivial mitral valve regurgitation. Tricuspid Valve: The tricuspid valve is grossly normal. Tricuspid valve regurgitation is mild. Aortic Valve: The aortic valve is tricuspid. There is moderate aortic valve annular calcification. Aortic valve regurgitation is not visualized. Pulmonic Valve: The pulmonic valve was grossly normal. Pulmonic valve regurgitation is trivial. Aorta: The aortic root is normal in size and structure. Venous: The inferior vena cava is normal in size with greater than 50% respiratory variability, suggesting right atrial pressure of 3 mmHg. IAS/Shunts: No atrial level shunt detected by color flow Doppler.  LEFT VENTRICLE PLAX 2D LVIDd:         4.66 cm  Diastology LVIDs:         2.03 cm  LV e' medial:    5.87 cm/s LV PW:          1.76 cm  LV E/e' medial:  14.0 LV IVS:        1.73 cm  LV e' lateral:   8.49 cm/s LVOT diam:     1.90 cm  LV E/e' lateral: 9.7 LVOT Area:  2.84 cm  RIGHT VENTRICLE RV S prime:     19.40 cm/s TAPSE (M-mode): 1.9 cm LEFT ATRIUM             Index       RIGHT ATRIUM           Index LA diam:        3.50 cm 1.39 cm/m  RA Area:     24.60 cm LA Vol (A2C):   43.9 ml 17.46 ml/m RA Volume:   82.30 ml  32.73 ml/m LA Vol (A4C):   43.8 ml 17.42 ml/m LA Biplane Vol: 44.4 ml 17.66 ml/m   AORTA Ao Root diam: 3.10 cm MITRAL VALVE MV Area (PHT): 2.64 cm    SHUNTS MV Decel Time: 287 msec    Systemic Diam: 1.90 cm MV E velocity: 82.30 cm/s MV A velocity: 59.10 cm/s MV E/A ratio:  1.39 Rozann Lesches MD Electronically signed by Rozann Lesches MD Signature Date/Time: 10/21/2020/4:33:43 PM    Final     Roxan Hockey, MD  [facility]  If 7PM-7AM, please contact night-coverage [email or web address] Password [email or web address] 11/08/2020, 6:18 PM   LOS: 19 days

## 2020-11-08 NOTE — Progress Notes (Signed)
ANTICOAGULATION CONSULT NOTE  Pharmacy Consult:  Heparin Indication: atrial fibrillation  No Known Allergies  Patient Measurements: Height: 5\' 7"  (170.2 cm) Weight: (!) 139.3 kg (307 lb 1.6 oz) IBW/kg (Calculated) : 66.1 HEPARIN DW (KG): 103.8  Vital Signs: Temp: 99.1 F (37.3 C) (01/03 0400) Temp Source: Axillary (01/03 0400) BP: 164/76 (01/03 0700) Pulse Rate: 57 (01/03 0700)  Labs: Recent Labs    11/06/20 0408 11/07/20 0453 11/08/20 0246  HGB 11.6* 11.9* 12.0*  HCT 39.2 40.1 40.1  PLT 261 277 255  HEPARINUNFRC 0.26* 0.30 0.34  CREATININE 2.06* 1.98* 1.84*    Estimated Creatinine Clearance: 61.9 mL/min (A) (by C-G formula based on SCr of 1.84 mg/dL (H)).  Assessment: Pharmacy consulted to dose heparin infusion for this 55 yo male with atrial fibrillation.  Patient was  taking apixaban PTA and was on treatment-dose Lovenox from 12-17 to 12-20. Heparin is being started due to patient's unstable renal function.   Goal of Therapy:  Heparin level 0.3-0.7 units/ml Monitor platelets by anticoagulation protocol: Yes   11/08/20  0800  update:  HL: 0.34 IU/mL-->in  goal range on heparin at 2450 units/hr CBC: Hb 11.4 >11.6>11.9 >12        Plates: 177>199>228>261>267>255  RN reports no bleeding complication or issues with infusion site   Plan:  Continue  heparin infusion at  2450 units/hr Daily heparin level and CBC Monitor for signs and symptoms of bleeding F/U transition to oral anti-coagulant    Despina Pole, Pharm. D. Clinical Pharmacist 11/08/2020 7:53 AM

## 2020-11-08 NOTE — Progress Notes (Signed)
NAME:  Jack Cox, MRN:  [id number], DOB:  1966/07/23, LOS: 53 ADMISSION DATE:  10/20/2020, CONSULTATION DATE:  10/20/20  REFERRING MD:  Tat, [facility]  CHIEF COMPLAINT:  Resp distress   Brief History   55 yo male never smoker with hx of OSA s/p tracheostomy 2001 and previously followed by Dr. Elsworth Soho presented on 10/20/20 with fever, cough with purulent sputum.  Found to have severe hypoxia and hypercapnia and required ventilatory support in ER.  Past Medical History  OSA, Morbid obesity with BMI 48.10, CKD, CHF, HTN, Gout, [facility] Events   12/17 changed to PCV prn  12/18 CXR improved  12/20 changed from propofol to Precedex 12/21 fever , bronch 12/30 added back mucomyst for plugs 01/03 tolerated 4 hrs of TC >> back on vent due to respiratory secretions and hypoxia  Consults:  Palliative care  Nephrology  Procedures:  New cuffed trach 12/15 >>  New cuffed trach 12/15  Bronch bedside 12/21>> copious secretions  Significant Diagnostic Tests:  Echo 12/16 >> EF 60 to 65%, severe LVH, mild RV systolic dysfx Bronchoscopy 12/21 >> copious secretions  Micro Data:  COVID/Flu 12/15 >> negative MRSA  PCR  12/15 >> negative Blood 12/15 >> negative Sputum 12/16 >> oral flora Blood 12/19 >> negative Sputum 12/21 >> oral flora Sputum 12/31 >> oral flora  Antimicrobials:  zmax  12/15  only Rocephin 12/15  only Cefepime12/21 >> 12/27   Interim history/subjective:  Back on vent.  Objective   Blood pressure (!) 173/71, pulse 63, temperature 99 F (37.2 C), resp. rate (!) 21, height 5\' 7"  (1.702 m), weight (!) 139.3 kg, SpO2 92 %.    Vent Mode: PCV FiO2 (%):  [45 %-80 %] 45 % Set Rate:  [16 bmp-20 bmp] 20 bmp Vt Set:  [550 mL] 550 mL PEEP:  [5 cmH20] 5 cmH20 Plateau Pressure:  [9 cmH20-21 cmH20] 18 cmH20   Intake/Output Summary (Last 24 hours) at 11/08/2020 1451 Last data filed at 11/08/2020 1402 Gross per 24 hour  Intake 1638.73 ml  Output 3925 ml  Net  -2286.27 ml   Filed Weights   11/01/20 0442 11/02/20 0500 11/05/20 0500  Weight: (!) 143.4 kg 130.5 kg (!) 139.3 kg    Examination:  General - alert Eyes - pupils reactive ENT - trach site clean Cardiac - regular rate/rhythm, no murmur Chest - scattered rhonchi Abdomen - soft, non tender, + bowel sounds Extremities - 1+ edema Skin - no rashes Neuro - RASS 0   Resolved Hospital Problem list   Tracheobronchitis/HCAP  Assessment & Plan:   Acute on chronic hypoxic/hypercapnic respiratory failure. OSA/OHS s/p tracheostomy. - trach collar as tolerated with vent support as needed - trach care - f/u CXR intermittently - goal SpO2 90 to 95%  Recurrent mucus plugging. - mucomyst, proventil  Sedation needs. - precedex with prn fentanyl/ativan for RASS goal 0 to -1  Acute on chronic diastolic CHF. Atrial flutter. CKD 4. - per primary team  Dysphagia. - primary team arranging for PEG with IR  Best practice:   Diet: TFs DVT prophylaxis: IV heparin  GI prophylaxis: Protonix Mobility: start to mobilize to chair as tol  Disposition: ICU; LTAC when bed available  Labs    CMP Latest Ref Rng & Units 11/08/2020 11/07/2020 11/06/2020  Glucose 70 - 99 mg/dL 127(H) 127(H) 126(H)  BUN 6 - 20 mg/dL 91(H) 93(H) 92(H)  Creatinine 0.61 - 1.24 mg/dL 1.84(H) 1.98(H) 2.06(H)  Sodium 135 - 145 mmol/L 137  137 138  Potassium 3.5 - 5.1 mmol/L 4.1 4.4 4.2  Chloride 98 - 111 mmol/L 101 101 103  CO2 22 - 32 mmol/L 28 28 27   Calcium 8.9 - 10.3 mg/dL 10.2 10.2 10.4(H)  Total Protein 6.5 - 8.1 g/dL - - -  Total Bilirubin 0.3 - 1.2 mg/dL - - -  Alkaline Phos 38 - 126 U/L - - -  AST 15 - 41 U/L - - -  ALT 0 - 44 U/L - - -    CBC Latest Ref Rng & Units 11/08/2020 11/07/2020 11/06/2020  WBC 4.0 - 10.5 K/uL 4.9 6.1 7.3  Hemoglobin 13.0 - 17.0 g/dL 12.0(L) 11.9(L) 11.6(L)  Hematocrit 39.0 - 52.0 % 40.1 40.1 39.2  Platelets 150 - 400 K/uL 255 277 261    ABG    Component Value Date/Time   PHART  7.242 (L) 10/20/2020 2355   PCO2ART 79.3 (HH) 10/20/2020 2355   PO2ART 80.0 (L) 10/20/2020 2355   HCO3 27.6 10/20/2020 2355   O2SAT 93.6 10/20/2020 2355    CBG (last 3)  Recent Labs    11/08/20 0514 11/08/20 0757 11/08/20 1155  GLUCAP 108* 133* 122*    Signature:  Chesley Mires, MD [place] Pager - [phone] 11/08/2020, 2:55 PM

## 2020-11-08 NOTE — Progress Notes (Signed)
Ran patient on PRVC mode. For approximately 4 hrs -- goal to open up airway by expanding  Lungs ( VT).

## 2020-11-08 NOTE — Progress Notes (Signed)
Patient taken off of ATC and placed back on vent due to desaturation episodes. Patient tolerating well at this time.

## 2020-11-08 NOTE — Progress Notes (Signed)
RT removed Mucomyst from [facility] schedule due to consistently runny frothy secretion all day. Patient is maintaining saturations and resting.

## 2020-11-09 ENCOUNTER — Inpatient Hospital Stay (HOSPITAL_COMMUNITY): Payer: Medicare Other

## 2020-11-09 DIAGNOSIS — J9621 Acute and chronic respiratory failure with hypoxia: Secondary | ICD-10-CM | POA: Diagnosis not present

## 2020-11-09 DIAGNOSIS — J9622 Acute and chronic respiratory failure with hypercapnia: Secondary | ICD-10-CM | POA: Diagnosis not present

## 2020-11-09 DIAGNOSIS — Z930 Tracheostomy status: Secondary | ICD-10-CM | POA: Diagnosis not present

## 2020-11-09 LAB — RENAL FUNCTION PANEL
Albumin: 2.5 g/dL — ABNORMAL LOW (ref 3.5–5.0)
Anion gap: 9 (ref 5–15)
BUN: 85 mg/dL — ABNORMAL HIGH (ref 6–20)
CO2: 28 mmol/L (ref 22–32)
Calcium: 10.7 mg/dL — ABNORMAL HIGH (ref 8.9–10.3)
Chloride: 100 mmol/L (ref 98–111)
Creatinine, Ser: 1.72 mg/dL — ABNORMAL HIGH (ref 0.61–1.24)
GFR, Estimated: 47 mL/min — ABNORMAL LOW (ref >60)
Glucose, Bld: 112 mg/dL — ABNORMAL HIGH (ref 70–99)
Phosphorus: 4.5 mg/dL (ref 2.5–4.6)
Potassium: 4 mmol/L (ref 3.5–5.1)
Sodium: 137 mmol/L (ref 135–145)

## 2020-11-09 LAB — GLUCOSE, CAPILLARY
Glucose-Capillary: 100 mg/dL — ABNORMAL HIGH (ref 70–99)
Glucose-Capillary: 102 mg/dL — ABNORMAL HIGH (ref 70–99)
Glucose-Capillary: 110 mg/dL — ABNORMAL HIGH (ref 70–99)
Glucose-Capillary: 96 mg/dL (ref 70–99)
Glucose-Capillary: 98 mg/dL (ref 70–99)
Glucose-Capillary: 99 mg/dL (ref 70–99)

## 2020-11-09 LAB — CBC
HCT: 37.9 % — ABNORMAL LOW (ref 39.0–52.0)
Hemoglobin: 11.4 g/dL — ABNORMAL LOW (ref 13.0–17.0)
MCH: 29.5 pg (ref 26.0–34.0)
MCHC: 30.1 g/dL (ref 30.0–36.0)
MCV: 97.9 fL (ref 80.0–100.0)
Platelets: 305 10*3/uL (ref 150–400)
RBC: 3.87 MIL/uL — ABNORMAL LOW (ref 4.22–5.81)
RDW: 14.3 % (ref 11.5–15.5)
WBC: 4.8 10*3/uL (ref 4.0–10.5)
nRBC: 0 % (ref 0.0–0.2)

## 2020-11-09 LAB — HEPARIN LEVEL (UNFRACTIONATED)
Heparin Unfractionated: 0.21 IU/mL — ABNORMAL LOW (ref 0.30–0.70)
Heparin Unfractionated: 0.32 IU/mL (ref 0.30–0.70)

## 2020-11-09 LAB — PROTIME-INR
INR: 1.2 (ref 0.8–1.2)
Prothrombin Time: 14.5 seconds (ref 11.4–15.2)

## 2020-11-09 MED ORDER — CLONIDINE HCL 0.2 MG PO TABS
0.2000 mg | ORAL_TABLET | Freq: Three times a day (TID) | ORAL | Status: DC
  Administered 2020-11-09 – 2020-11-12 (×6): 0.2 mg
  Filled 2020-11-09 (×6): qty 1

## 2020-11-09 MED ORDER — METOPROLOL TARTRATE 25 MG PO TABS
25.0000 mg | ORAL_TABLET | Freq: Two times a day (BID) | ORAL | Status: DC
  Administered 2020-11-09 – 2020-11-12 (×4): 25 mg
  Filled 2020-11-09 (×4): qty 1

## 2020-11-09 MED ORDER — ACETYLCYSTEINE 20 % IN SOLN
1.0000 mL | Freq: Two times a day (BID) | RESPIRATORY_TRACT | Status: DC
  Administered 2020-11-09 – 2020-11-11 (×4): 1 mL via RESPIRATORY_TRACT
  Filled 2020-11-09 (×4): qty 4

## 2020-11-09 NOTE — Progress Notes (Signed)
Physical Therapy Treatment Patient Details Name: Jack Cox MRN: [id number] DOB: [birth year] Today's Date: 11/09/2020    History of Present Illness Jack Cox is a 55 y.o. male with medical history of diastolic CHF, atrial flutter, obstructive sleep apnea, hypertension, right bundle branch block presenting with shortness of breath for the last 2 to 3 days.  Unfortunately, the patient is encephalopathic at the time of my evaluation; therefore, history is limited.  Apparently, the patient went to see his nephrologist earlier on 10/20/2020.  He was noted to have oxygen saturation in the 60s.  As result, he was sent to the emergency department for further evaluation.  Initially, the patient was awake and conversant with oxygen saturation in the low 70s.  He was placed on a nonrebreather after which the patient became obtunded.  VBG showed pH 7.1 66/97/106 on 80%.  Subsequently, the patient was placed on the ventilator with PRVC, PEEP of 5, FiO2 70%.  After approximately 10 to 15 minutes on the ventilator, the patient remained somnolent, but awakened and was able to tell me his name.  He did state that he has not been taking his torsemide as directed.  In addition, the patient has been sleeping in a recliner for an unclear duration of time secondary to shortness of breath.  At baseline, the patient has a size 4 noncuffed tracheostomy.  He is on supplemental oxygen 2.5 L during the day and 4 L at nighttime.  There is no reports of nausea, vomiting, diarrhea, chest pain, abdominal pain.  In the emergency department, the patient was afebrile and hemodynamically stable with oxygen saturation 93-94% on the ventilator with FiO2 70%.  Chest x-ray showed left-sided pleural effusion with increased interstitial markings.  BNP 790.  PCT 0.12.  BMP showed a serum creatinine 2.43.  LFTs were unremarkable.  WBC 8.1, hemoglobin 14.5, platelets 253,000.  The patient was given ceftriaxone and azithromycin.  He was also  given furosemide 80 mg IV x1.  COVID-19 RT-PCR negative    PT Comments    Patient agreeable for and motivated for therapy - his x-wife in room.  Patient demonstrates improved balance for sitting up at bedside while completing BLE exercises, increased BLE strength for completing partial sit to stands, unable to lock knees due to weakness and good return for using extremities to help reposition self with bed in head down position when put back to bed.  Patient will benefit from continued physical therapy in hospital and recommended venue below to increase strength, balance, endurance for safe ADLs and gait.   Follow Up Recommendations  SNF     Equipment Recommendations  None recommended by PT    Recommendations for Other Services       Precautions / Restrictions Precautions Precautions: Fall Restrictions Weight Bearing Restrictions: No    Mobility  Bed Mobility Overal bed mobility: Needs Assistance Bed Mobility: Sit to Supine;Supine to Sit     Supine to sit: Mod assist Sit to supine: Mod assist   General bed mobility comments: demonstrates improvement for moving BLE during bed mobility  Transfers Overall transfer level: Needs assistance Equipment used: Rolling walker (2 wheeled) Transfers: Sit to/from Stand Sit to Stand: Mod assist;Max assist         General transfer comment: able to partially stand, but can't lock knees due to weakness  Ambulation/Gait                 Stairs  Wheelchair Mobility    Modified Rankin (Stroke Patients Only)       Balance Overall balance assessment: Needs assistance Sitting-balance support: Feet supported;No upper extremity supported Sitting balance-Leahy Scale: Fair Sitting balance - Comments: fair/good seated at EOB   Standing balance support: During functional activity;Bilateral upper extremity supported Standing balance-Leahy Scale: Poor Standing balance comment: using RW                             Cognition Arousal/Alertness: Awake/alert Behavior During Therapy: WFL for tasks assessed/performed Overall Cognitive Status: Within Functional Limits for tasks assessed                                        Exercises General Exercises - Lower Extremity Long Arc Quad: Seated;AROM;Strengthening;Both;10 reps Hip Flexion/Marching: Seated;AROM;Strengthening;Both;10 reps Toe Raises: Seated;AROM;Strengthening;Both;20 reps Heel Raises: Seated;AROM;Strengthening;Both;20 reps Other Exercises Other Exercises: progressive resistance to BUE and holding arms overhead against gravity x 5 reps of shoulder flexion    General Comments        Pertinent Vitals/Pain Pain Assessment: Faces Faces Pain Scale: Hurts a little bit Pain Location: right shoulder with end range flexion/abduction, hands Pain Descriptors / Indicators: Guarding;Sore;Grimacing Pain Intervention(s): Limited activity within patient's tolerance;Monitored during session;Repositioned    Home Living                      Prior Function            PT Goals (current goals can now be found in the care plan section) Acute Rehab PT Goals Patient Stated Goal: return home after rehab PT Goal Formulation: With patient Time For Goal Achievement: 11/17/20 Potential to Achieve Goals: Good Progress towards PT goals: Progressing toward goals    Frequency    Min 3X/week      PT Plan      Co-evaluation              AM-PAC PT "6 Clicks" Mobility   Outcome Measure  Help needed turning from your back to your side while in a flat bed without using bedrails?: A Lot Help needed moving from lying on your back to sitting on the side of a flat bed without using bedrails?: A Lot Help needed moving to and from a bed to a chair (including a wheelchair)?: Total Help needed standing up from a chair using your arms (e.g., wheelchair or bedside chair)?: A Lot Help needed to walk in hospital room?:  Total Help needed climbing 3-5 steps with a railing? : Total 6 Click Score: 9    End of Session Equipment Utilized During Treatment: Oxygen Activity Tolerance: Patient tolerated treatment well;Patient limited by fatigue Patient left: in bed;with call bell/phone within reach Nurse Communication: Mobility status PT Visit Diagnosis: Unsteadiness on feet (R26.81);Other abnormalities of gait and mobility (R26.89);Muscle weakness (generalized) (M62.81)     Time: 4098-1191 PT Time Calculation (min) (ACUTE ONLY): 35 min  Charges:  $Therapeutic Exercise: 8-22 mins $Therapeutic Activity: 8-22 mins                     2:45 PM, 11/09/20 Lonell Grandchild, MPT Physical Therapist with [facility] [phone] office [phone] mobile phone

## 2020-11-09 NOTE — Progress Notes (Signed)
NAME:  Jack Cox, MRN:  [id number], DOB:  03-16-1966, LOS: 54 ADMISSION DATE:  10/20/2020, CONSULTATION DATE:  10/20/20  REFERRING MD:  Tat, [facility]  CHIEF COMPLAINT:  Resp distress   Brief History   55 yo male never smoker with hx of OSA s/p tracheostomy 2001 and previously followed by Dr. Elsworth Soho presented on 10/20/20 with fever, cough with purulent sputum.  Found to have severe hypoxia and hypercapnia and required ventilatory support in ER.  Past Medical History  OSA, Morbid obesity with BMI 48.10, CKD, CHF, HTN, Gout, [facility] Events   12/17 changed to PCV prn  12/18 CXR improved  12/20 changed from propofol to Precedex 12/21 fever , bronch 12/30 added back mucomyst for plugs 01/03 tolerated 4 hrs of TC >> back on vent due to respiratory secretions and hypoxia  Consults:  Palliative care  Nephrology  Procedures:  New cuffed trach 12/15 >>  New cuffed trach 12/15  Bronch bedside 12/21>> copious secretions  Significant Diagnostic Tests:  Echo 12/16 >> EF 60 to 65%, severe LVH, mild RV systolic dysfx Bronchoscopy 12/21 >> copious secretions  Micro Data:  COVID/Flu 12/15 >> negative MRSA  PCR  12/15 >> negative Blood 12/15 >> negative Sputum 12/16 >> oral flora Blood 12/19 >> negative Sputum 12/21 >> oral flora Sputum 12/31 >> oral flora  Antimicrobials:  zmax  12/15  only Rocephin 12/15  only Cefepime12/21 >> 12/27   Interim history/subjective:  Tolerating TC better today.  Objective   Blood pressure (!) 165/77, pulse 68, temperature 99 F (37.2 C), temperature source Oral, resp. rate 20, height 5\' 7"  (1.702 m), weight (!) 144.5 kg, SpO2 98 %.    Vent Mode: PCV FiO2 (%):  [45 %-80 %] 80 % Set Rate:  [20 bmp] 20 bmp Vt Set:  [550 mL] 550 mL PEEP:  [5 cmH20] 5 cmH20 Plateau Pressure:  [15 cmH20-22 cmH20] 16 cmH20   Intake/Output Summary (Last 24 hours) at 11/09/2020 1449 Last data filed at 11/09/2020 1400 Gross per 24 hour  Intake 5525.68  ml  Output 3575 ml  Net 1950.68 ml   Filed Weights   11/02/20 0500 11/05/20 0500 11/09/20 0448  Weight: 130.5 kg (!) 139.3 kg (!) 144.5 kg    Examination:  General - alert Eyes - pupils reactive ENT - trach site clean Cardiac - regular rate/rhythm, no murmur Chest - scattered rhonchi Abdomen - soft, non tender, + bowel sounds Extremities - 1+ edema Skin - no rashes Neuro - follows commands  Resolved Hospital Problem list   Tracheobronchitis/HCAP  Assessment & Plan:   Acute on chronic hypoxic/hypercapnic respiratory failure. OSA/OHS s/p tracheostomy. - TC as tolerated with vent support as needed - might be ready for PMV trial soon with speech once respiratory secretions clear up better - f/u CXR intermittently - goal SpO2 90 to 95%  Recurrent mucus plugging. - continue mucomyst, albuterol - bronchial hygiene  Sedation needs. - precedex with prn fentanyl/ativan for RASS goal 0 to -1  Acute on chronic diastolic CHF. Atrial flutter. CKD 4. - per primary team  Dysphagia. - primary team arranging for PEG with IR >> tentatively planned for 11/10/20  Best practice:   Diet: TFs DVT prophylaxis: IV heparin  GI prophylaxis: Protonix Mobility: start to mobilize to chair as tol  Disposition: ICU; LTAC when bed available  Labs    CMP Latest Ref Rng & Units 11/09/2020 11/08/2020 11/07/2020  Glucose 70 - 99 mg/dL 112(H) 127(H) 127(H)  BUN 6 -  20 mg/dL 85(H) 91(H) 93(H)  Creatinine 0.61 - 1.24 mg/dL 1.72(H) 1.84(H) 1.98(H)  Sodium 135 - 145 mmol/L 137 137 137  Potassium 3.5 - 5.1 mmol/L 4.0 4.1 4.4  Chloride 98 - 111 mmol/L 100 101 101  CO2 22 - 32 mmol/L 28 28 28   Calcium 8.9 - 10.3 mg/dL 10.7(H) 10.2 10.2  Total Protein 6.5 - 8.1 g/dL - - -  Total Bilirubin 0.3 - 1.2 mg/dL - - -  Alkaline Phos 38 - 126 U/L - - -  AST 15 - 41 U/L - - -  ALT 0 - 44 U/L - - -    CBC Latest Ref Rng & Units 11/09/2020 11/08/2020 11/07/2020  WBC 4.0 - 10.5 K/uL 4.8 4.9 6.1  Hemoglobin 13.0 -  17.0 g/dL 11.4(L) 12.0(L) 11.9(L)  Hematocrit 39.0 - 52.0 % 37.9(L) 40.1 40.1  Platelets 150 - 400 K/uL 305 255 277    ABG    Component Value Date/Time   PHART 7.242 (L) 10/20/2020 2355   PCO2ART 79.3 (HH) 10/20/2020 2355   PO2ART 80.0 (L) 10/20/2020 2355   HCO3 27.6 10/20/2020 2355   O2SAT 93.6 10/20/2020 2355    CBG (last 3)  Recent Labs    11/09/20 0350 11/09/20 0736 11/09/20 1154  GLUCAP 102* 100* 110*    Signature:  Chesley Mires, MD [place] Pager - [phone] 11/09/2020, 2:49 PM

## 2020-11-09 NOTE — Progress Notes (Signed)
Patient is going to be sent to [facility] IR tomorrow and being picked up at 1015 to be at [facility] by 11AM. Transport confirmed.

## 2020-11-09 NOTE — TOC Progression Note (Signed)
Transition of Care [facility]) - Progression Note    Patient Details  Name: Jack Cox MRN: [id number] Date of Birth: Apr 29, 1966  Transition of Care [facility]) CM/SW Contact  Boneta Lucks, RN Phone Number: 11/09/2020, 3:45 PM  Clinical Narrative:   TOC to call [facility] after patient get Peg tube, he is scheduled to go end of the week to [facility] IR. [facility] is also requesting patient be off heparin and have trach collar trials documented before they attempt to start INS AUTH.      Expected Discharge Plan: Home/Self Care Barriers to Discharge: Continued Medical Work up  Expected Discharge Plan and Services Expected Discharge Plan: Home/Self Care In-house Referral: Clinical Social Work Discharge Planning Services: NA   Living arrangements for the past 2 months: Single Family Home                 DME Arranged: N/A DME Agency: NA       HH Arranged: NA [place] Agency: NA

## 2020-11-09 NOTE — Progress Notes (Signed)
IR consulted by Dr. Denton Brick for possible image-guided percutaneous gastrostomy tube placement.  Case/images have been reviewed by Dr. Laurence Ferrari who approves procedure. Plan for image-guided percutaneous gastrostomy tube placement in [facility] IR tentatively for tomorrow 11/10/2020. Patient will need [facility] round trip for procedure (arrive at 1100 for 1200 procedure). Patient will be NPO at midnight prior to procedure including tube feeds. IV Heparin will be held starting at 0900 per Dr. Earleen Newport. INR pending. Patient will be seen/consented for procedure in [facility] IR prior to procedure. [place], RN aware to set up [facility] in addition to above orders.  Please call IR with questions/concerns.   Bea Graff Toniann Dickerson, PA-C 11/09/2020, 11:17 AM

## 2020-11-09 NOTE — Progress Notes (Signed)
ANTICOAGULATION CONSULT NOTE  Pharmacy Consult:  Heparin Indication: atrial fibrillation  No Known Allergies  Patient Measurements: Height: 5\' 7"  (170.2 cm) Weight: (!) 144.5 kg (318 lb 9 oz) IBW/kg (Calculated) : 66.1 HEPARIN DW (KG): 103.8  Vital Signs: Temp: 99 F (37.2 C) (01/04 1338) Temp Source: Oral (01/04 1338) BP: 165/77 (01/04 1100) Pulse Rate: 68 (01/04 1400)  Labs: Recent Labs    11/07/20 0453 11/08/20 0246 11/09/20 0422 11/09/20 1356  HGB 11.9* 12.0* 11.4*  --   HCT 40.1 40.1 37.9*  --   PLT 277 255 305  --   LABPROT  --   --   --  14.5  INR  --   --   --  1.2  HEPARINUNFRC 0.30 0.34 0.21* 0.32  CREATININE 1.98* 1.84* 1.72*  --     Estimated Creatinine Clearance: 67.7 mL/min (A) (by C-G formula based on SCr of 1.72 mg/dL (H)).  Assessment: Pharmacy consulted to dose heparin infusion for this 55 yo male with atrial fibrillation.  Patient was  taking apixaban PTA and was on treatment-dose Lovenox from 12-17 to 12-20. Heparin is being started due to patient's unstable renal function.   HL 0.32: Therapeutic  Goal of Therapy:  Heparin level 0.3-0.7 units/ml Monitor platelets by anticoagulation protocol: Yes    Plan:  Continue  heparin infusion at  2550 units/hr Daily heparin level and CBC Monitor for signs and symptoms of bleeding F/U transition to oral anti-coagulant   Margot Ables, PharmD Clinical Pharmacist 11/09/2020 3:14 PM

## 2020-11-09 NOTE — Progress Notes (Addendum)
ANTICOAGULATION CONSULT NOTE  Pharmacy Consult:  Heparin Indication: atrial fibrillation  No Known Allergies  Patient Measurements: Height: 5\' 7"  (170.2 cm) Weight: (!) 144.5 kg (318 lb 9 oz) IBW/kg (Calculated) : 66.1 HEPARIN DW (KG): 103.8  Vital Signs: Temp: 98.3 F (36.8 C) (01/04 0734) Temp Source: Oral (01/04 0734) BP: 149/71 (01/04 0500) Pulse Rate: 64 (01/04 0500)  Labs: Recent Labs    11/07/20 0453 11/08/20 0246 11/09/20 0422  HGB 11.9* 12.0* 11.4*  HCT 40.1 40.1 37.9*  PLT 277 255 305  HEPARINUNFRC 0.30 0.34 0.21*  CREATININE 1.98* 1.84* 1.72*    Estimated Creatinine Clearance: 67.7 mL/min (A) (by C-G formula based on SCr of 1.72 mg/dL (H)).  Assessment: Pharmacy consulted to dose heparin infusion for this 55 yo male with atrial fibrillation.  Patient was  taking apixaban PTA and was on treatment-dose Lovenox from 12-17 to 12-20. Heparin is being started due to patient's unstable renal function.   HL 0.21: heparin level has been stable on this dose- will increase a little and reorder heparin level for later this afternoon.   Goal of Therapy:  Heparin level 0.3-0.7 units/ml Monitor platelets by anticoagulation protocol: Yes    Plan:  Increase  heparin infusion at  2550 units/hr Daily heparin level and CBC Monitor for signs and symptoms of bleeding F/U transition to oral anti-coagulant   Margot Ables, PharmD Clinical Pharmacist 11/09/2020 8:13 AM

## 2020-11-09 NOTE — Progress Notes (Signed)
         PROGRESS NOTE  Jack Cox MRN:[id number] DOB: 12/01/1965 DOA: 10/20/2020 PCP: Shah, Ashish, MD  Brief History: 55 y.o.malewith medical history ofdiastolic CHF, atrial flutter, obstructive sleep apnea, hypertension, right bundle branch block presenting with shortness of breath for the last 2 to 3 days. Unfortunately, the patient is encephalopathic at the time of my evaluation; therefore, history is limited. Apparently, the patient went to see his nephrologist earlier on 10/20/2020. He was noted to have oxygen saturation in the 60s. As result, he was sent to the emergency department for further evaluation. Initially, the patient was awake and conversant with oxygen saturation in the low 70s. He was placed on a nonrebreather after which the patient became obtunded. VBG showed pH 7.1 66/97/106 on 80%. Subsequently, the patient was placed on the ventilator with PRVC,PEEP of 5, FiO2 70%. After approximately 10 to 15 minutes on the ventilator, the patient remained somnolent, but awakened and was able to tell me his name. He did state that he has not been taking his torsemide as directed. In addition, the patient has been sleeping in a recliner for an unclear duration of time secondary to shortness of breath. At baseline, the patient has a size 4 noncuffed tracheostomy. He is on supplemental oxygen 2.5 L during the day and 4 L at nighttime. There is no reports of nausea, vomiting, diarrhea, chest pain, abdominal pain. In the emergency department, the patient was afebrile and hemodynamically stable with oxygen saturation 93-94% on the ventilator with FiO2 70%. Chest x-ray showed left-sided pleural effusion with increased interstitial markings. BNP 790. PCT 0.12. BMP showed a serum creatinine 2.43. LFTs were unremarkable. WBC 8.1, hemoglobin 14.5, platelets 253,000. The patient was given ceftriaxone and azithromycin. He was also given furosemide 80 mg IV x1. COVID-19 RT-PCR  negative. He was continued on IV lasix 80 mg bid with uptrending of his serum creatinine. Renal was consulted to assist.Pulmonary was also consulted to assist also.  -- Assessment/Plan: Acute on chronic respiratory failure with hypoxia and hypercarbia -OSA/OHS s/p tracheostomy. -Secondary to CHF in the setting of OSA --He has a size 4 cuffless trach -remainson ventilator with trachwith trach collar trials during day, still requires vent at night and requires vent as needed during the day -PCCM input appreciated -having recurrent mucus plugging--needs aggressive suctioning and  mucomyst, proventil -Mucomyst dosage decreased on 11/10/2019 due to very thin secretions -10/26/20-bronchoscopy--Thick inspissated mucus in proximal airways & BL lower lobes, removed with suctioning --wean with PSV and PCV rest mode -will wean off Precedex drip --May use IV Ativan and IV fentanyl as needed especially when patient goes on the vent  Acute on chronic diastolic CHF/Cor Pulmonale -06/25/2019 echo EF 55%, trivial TR -12/16/21echo--EF 60-65%, no WMA, RV pressure overload, mild TR -Accurate I's and O's--NEG so far -Daily weights  Atrial flutter- controlled -apixabantemporarilyon hold, he is on an IV heparin infusionfor full anticoagulation-may switch to Lovenox or apixaban when renal function improves further after PEG tube has been placed Increase metoprolol to 25 mg BID per tube   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   Fever -resolved -Remains afebrile -blood cultures x 2--neg to date -UA and urine culture--no significant pyuria -12/16 trach aspirate-->flora -10/26/20-->BAL sent for culture -completed course of cefepime12/27   Essential hypertension -overall stable -Increase clonidine to 0.2 mg 3 times daily, metoprolol as above  Right bundle branch block -This has been chronic with review of the medical records  Morbid obesity -BMI 44.01 -This complicates overall care, -Increases morbidity and mortality  GOC -palliative following -currently full scope of care  Anticoagulation--continue IV heparin to allow for PEG tube placement, if renal function continues to improve after PEG tube placement may transition to subcu Lovenox or Eliquis pending renal function  LTAC--- discharge criteria--- LTAC request that prior to being accepted patient will have to tolerate trach collar well (we need to have documentation of trach collar trials) - patient will have to have the PEG tube for feeding properly,  patient will have to be off IV heparin  Status is: Inpatient  Remains inpatient appropriate because:IV treatments appropriate due to intensity of illness or inability to take PO  Dispo: The patient is from:Home Anticipated d/c is UU:VOZD Anticipated d/c date is: >3 days Patient currently is not medically stable to d/c.   Family Communication:brotherat bedside on 11/07/2020  Consultants:pulm, renal, IR  Code Status: FULL  DVT Prophylaxis: IV heparin infusion for full anticoagulation   Procedures: As Listed in Progress Note Above  Antibiotics: Cefepime 12/21>>12/27   Subjective: - -His secretions are very thin, Mucomyst dose is reduced -Doing well with attempt to wean off Precedex Remains afebrile  Objective: Vitals:    11/09/20 1300 11/09/20 1338 11/09/20 1400 11/09/20 1514  BP:      Pulse: 70  68 72  Resp:   20 18  Temp:  99 F (37.2 C)    TempSrc:  Oral    SpO2: 98%  98% 93%  Weight:      Height:        Intake/Output Summary (Last 24 hours) at 11/09/2020 1632 Last data filed at 11/09/2020 1400 Gross per 24 hour  Intake 5525.68 ml  Output 3575 ml  Net 1950.68 ml   Weight change:  Exam:   General:  Pt is alert, follows commands appropriately, not in acute distress  HEENT: Tracheostomy with Trach collar  cardiovascular: RRR, S1/S2, no rubs, no gallops  Respiratory: bilateral rhonchi, air movement is fair and symmetrical  Abdomen: Soft/+BS, non tender, non distended, no guarding  Extremities: 1+LE edema, No lymphangitis, No petechiae, No rashes, no synovitis  Psych-appears appropriate overall   Data Reviewed: I have personally reviewed following labs and imaging studies Basic Metabolic Panel: Recent Labs  Lab 11/05/20 0616 11/06/20 0408 11/07/20 0453 11/08/20 0246 11/09/20 0422  NA 141 138 137 137 137  K 4.2 4.2 4.4 4.1 4.0  CL 105 103 101 101 100  CO2 _0 GLUCOSE 129* 126* 127* 127* 112*  BUN 98* 92* 93* 91* 85*  CREATININE 2.24* 2.06* 1.98* 1.84* 1.72*  CALCIUM 10.1 10.4* 10.2 10.2 10.7*  PHOS 4.9* 4.1 5.3* 4.6 4.5   Liver Function Tests: Recent Labs  Lab 11/05/20 0616 11/06/20 0408 11/07/20 0453 11/08/20 0246 11/09/20 0422  ALBUMIN 2.3* 2.3* 2.5* 2.4* 2.5*   No results for input(s): LIPASE, AMYLASE in the last 168 hours. No results for input(s): AMMONIA in the last 168 hours. Coagulation Profile: Recent Labs  Lab 11/09/20 1356  INR 1.2  CBC: Recent Labs  Lab 11/05/20 0616 11/06/20 0408 11/07/20 0453 11/08/20 0246 11/09/20 0422  WBC 5.8 7.3 6.1 4.9 4.8  HGB 11.4* 11.6* 11.9* 12.0* 11.4*  HCT 39.1 39.2 40.1 40.1 37.9*  MCV 101.3* 99.0 99.3 98.3 97.9  PLT 228 261 277 255 305   Cardiac Enzymes: No results for input(s): CKTOTAL, CKMB,  CKMBINDEX, TROPONINI in the last 168 hours. BNP: Invalid input(s): POCBNP CBG: Recent Labs  Lab 11/08/20 2310 11/09/20 0350 11/09/20 0736 11/09/20 1154 11/09/20 1608  GLUCAP 91 102* 100* 110* 98   HbA1C: No results for input(s): HGBA1C in the last 72 hours. Urine analysis:    Component Value Date/Time   COLORURINE YELLOW 10/28/2020 0429   APPEARANCEUR HAZY (A) 10/28/2020 0429   LABSPEC 1.014 10/28/2020 0429   PHURINE 5.0 10/28/2020 0429   GLUCOSEU NEGATIVE 10/28/2020 0429   HGBUR SMALL (A) 10/28/2020 0429   BILIRUBINUR NEGATIVE 10/28/2020 0429   KETONESUR NEGATIVE 10/28/2020 0429   PROTEINUR 100 (A) 10/28/2020 0429   NITRITE NEGATIVE 10/28/2020 0429   LEUKOCYTESUR NEGATIVE 10/28/2020 0429   Sepsis Labs: @LABRCNTIP(procalcitonin:4,lacticidven:4) ) Recent Results (from the past 240 hour(s))  Culture, respiratory (non-expectorated)     Status: None   Collection Time: 11/05/20 11:46 AM   Specimen: Tracheal Aspirate; Respiratory  Result Value Ref Range Status   Specimen Description   Final    TRACHEAL ASPIRATE Performed at [facility], [address] [place], [place] [address]    Special Requests   Final    NONE Performed at [facility], [address] [place], [place] [address]    Gram Stain   Final    MODERATE WBC PRESENT,BOTH PMN AND MONONUCLEAR FEW GRAM POSITIVE COCCI FEW GRAM NEGATIVE RODS RARE GRAM POSITIVE RODS    Culture   Final    FEW Normal respiratory flora-no Staph aureus or Pseudomonas seen Performed at [facility], [address] [place], [place] [address]    Report Status 11/08/2020 FINAL  Final     Scheduled Meds: . acetylcysteine  1 mL Nebulization BID  . albuterol  2.5 mg Nebulization QID  . chlorhexidine gluconate (MEDLINE KIT)  15 mL Mouth Rinse BID  . Chlorhexidine Gluconate Cloth  6 each Topical Daily  . cloNIDine  0.2 mg Per Tube TID  . feeding supplement (PROSource TF)  90 mL Per Tube TID  . free water  400 mL Per Tube Q4H   . mouth rinse  15 mL Mouth Rinse 2 times per day  . metoprolol tartrate  25 mg Per Tube BID  . pantoprazole sodium  40 mg Per Tube Q1200  . sodium chloride flush  3 mL Intravenous Q12H   Continuous Infusions: . sodium chloride    . sodium chloride 250 mL (11/02/20 1546)  . amiodarone    . dexmedetomidine (PRECEDEX) IV infusion 0.4 mcg/kg/hr (11/09/20 1543)  . feeding supplement (VITAL HIGH PROTEIN) 1,000 mL (11/09/20 1530)  . heparin 2,550 Units/hr (11/09/20 1425)    Procedures/Studies: CT ABDOMEN WO CONTRAST  Result Date: 11/08/2020 CLINICAL DATA:  54-year-old male undergoing anatomic evaluation for possible percutaneous gastrostomy tube placement. EXAM: CT ABDOMEN WITHOUT CONTRAST TECHNIQUE: Multidetector CT imaging of the abdomen was performed following the standard protocol without IV contrast. COMPARISON:  Prior CT scan of the abdomen and pelvis 06/12/2018 FINDINGS: Lower chest: Small right and trace left pleural effusions. Small 0.4 cm nodule in the right middle lobe, unchanged compared to prior imaging dating back to 2019 and therefore benign. Combination   of volume loss/atelectasis and consolidative changes present in both lower lobes. Cardiomegaly. No pericardial effusion. Calcifications present along the coronary arteries. Hepatobiliary: Nodular hepatic contour with relative hypertrophy of the left hepatic lobe. These findings are morphologically compatible with cirrhosis. No definite discrete mass within the limitations of noncontrast enhanced technique. Gallbladder is unremarkable. No intra or extrahepatic biliary ductal dilatation. Pancreas: Unremarkable. No pancreatic ductal dilatation or surrounding inflammatory changes. Spleen: Normal in size without focal abnormality. Adrenals/Urinary Tract: Unremarkable adrenal glands. Nonspecific perinephric stranding bilaterally. No hydronephrosis or nephrolithiasis. Stomach/Bowel: Gastric tube terminates in the stomach. Normal anatomic position of  the stomach without evidence of colonic interposition or other complicating feature. Colonic diverticular disease without CT evidence of active inflammation. Vascular/Lymphatic: Limited evaluation in the absence of intravenous contrast. Mild scattered atherosclerotic vascular calcifications. No aneurysm. No suspicious lymphadenopathy. Other: No evidence of ascites. Musculoskeletal: No acute fracture or aggressive appearing lytic or blastic osseous lesion. IMPRESSION: 1. Anatomy is suitable for percutaneous gastrostomy tube placement. 2. Small right and trace left pleural effusions with associated bibasilar atelectasis and consolidation which may reflect pneumonia or aspiration. 3. Cardiomegaly. 4. Coronary artery calcifications. 5. Hepatic cirrhosis. 6. Colonic diverticular disease without CT evidence of active inflammation. 7.  Aortic Atherosclerosis (ICD10-I70.0). Electronically Signed   By: Heath  McCullough M.D.   On: 11/08/2020 15:46   US RENAL  Result Date: 10/28/2020 CLINICAL DATA:  Chronic kidney disease.  Acute kidney injury. EXAM: RENAL / URINARY TRACT ULTRASOUND COMPLETE COMPARISON:  CT 06/12/2018 FINDINGS: Right Kidney: Renal measurements: 11.0 x 6.4 x 5.2 cm = volume: 190 mL. Increased echogenicity. No focal lesion or hydronephrosis. Left Kidney: Renal measurements: 11.2 x 7.6 x 6.2 cm = volume: 276 mL. Increased echogenicity. No focal lesion or hydronephrosis. Bladder: Foley catheter within the bladder. Other: None. IMPRESSION: Slightly enlarged kidneys showing increased echogenicity. Findings could be due to chronic diabetic glomerulonephropathy or acute nephritis. No obstruction or focal lesion. Electronically Signed   By: Mark  Shogry M.D.   On: 10/28/2020 14:30   DG Chest Port 1 View  Result Date: 11/09/2020 CLINICAL DATA:  Respiratory failure EXAM: PORTABLE CHEST 1 VIEW COMPARISON:  11/05/2020 FINDINGS: Tracheostomy and nasogastric tube extending into the upper abdomen beyond the margin of  the examination are again identified. Lung volumes are small and pulmonary insufflation has diminished since prior examination. Mild left basilar atelectasis or infiltrate. No pneumothorax or pleural effusion. Mild cardiomegaly is stable. Pulmonary vascularity is normal when accounting for poor pulmonary insufflation. No acute bone abnormality. IMPRESSION: Stable support tubes. Progressive pulmonary hypoinflation. Probable developing retrocardiac atelectasis. Stable cardiomegaly Electronically Signed   By: Ashesh  Parikh MD   On: 11/09/2020 04:52   DG Chest Port 1 View  Result Date: 11/05/2020 CLINICAL DATA:  Respiratory failure EXAM: PORTABLE CHEST 1 VIEW COMPARISON:  11/02/2020 FINDINGS: Tracheostomy and NG tube are unchanged. Cardiomegaly. Mild vascular congestion. Bilateral airspace opacities again noted, worse at the left base. This is stable since prior study. No visible effusions or pneumothorax. IMPRESSION: Cardiomegaly. Bilateral airspace disease, most pronounced in the left base, stable since prior study. Electronically Signed   By: Kevin  Dover M.D.   On: 11/05/2020 08:03   DG CHEST PORT 1 VIEW  Result Date: 11/02/2020 CLINICAL DATA:  NG tube placement. EXAM: PORTABLE CHEST 1 VIEW COMPARISON:  Radiograph yesterday. FINDINGS: The tip of the enteric tube is below the diaphragm, not included in the field of view. The side-port is not well visualized but likely below the diaphragm. Stable cardiomegaly. Patchy airspace   opacities in both lung bases, unchanged. IMPRESSION: 1. Tip of the enteric tube below the diaphragm, not included in the field of view. 2. Unchanged patchy bibasilar airspace opacities. Electronically Signed   By: Keith Rake M.D.   On: 11/02/2020 21:51   DG CHEST PORT 1 VIEW  Result Date: 11/01/2020 CLINICAL DATA:  Acute on chronic respiratory failure. EXAM: PORTABLE CHEST 1 VIEW COMPARISON:  10/31/2020 FINDINGS: The tracheostomy tube is stable. The NG tube is stable. Much  improved left lung aeration when compared to the prior study. The left upper lobe is now aerated. Persistent airspace process in the right lung and at the left lung base. No pleural effusions or pneumothorax. IMPRESSION: 1. Much improved left lung aeration when compared to the prior study. 2. Persistent right lung and left basilar airspace process. Electronically Signed   By: Marijo Sanes M.D.   On: 11/01/2020 05:55   DG CHEST PORT 1 VIEW  Result Date: 10/31/2020 CLINICAL DATA:  Acute on chronic respiratory EXAM: PORTABLE CHEST 1 VIEW COMPARISON:  Failure four days ago FINDINGS: Enteric tube which reaches the stomach. Tracheostomy tube in place. Interval white out of the left chest with volume loss. Progressive hazy opacification of the right lower chest. Heart size is largely obscured. No visible air leak. IMPRESSION: 1. Interval white out of the left chest, likely plugging and pulmonary collapse. 2. Progressive airspace disease or pleural effusion on the right. Electronically Signed   By: Monte Fantasia M.D.   On: 10/31/2020 04:22   DG Chest Port 1 View  Result Date: 10/27/2020 CLINICAL DATA:  Acute respiratory failure EXAM: PORTABLE CHEST 1 VIEW COMPARISON:  Yesterday FINDINGS: Enteric and tracheostomy tubes remain in good position. Cardiomegaly. Interstitial opacity which could be bronchitic or congestive. There is improved aeration at the right base. Negative for pneumothorax. IMPRESSION: 1. Improved aeration. 2. Bronchitic or congestive interstitial opacity. Electronically Signed   By: Monte Fantasia M.D.   On: 10/27/2020 07:46   DG Chest Port 1 View  Result Date: 10/26/2020 CLINICAL DATA:  Shortness of breath for 2-3 days EXAM: PORTABLE CHEST 1 VIEW COMPARISON:  Three days ago FINDINGS: Progressive hazy appearance of the right mid to lower chest. Dense retrocardiac opacity persists. An enteric tube and tracheostomy tube remain in place. Cardiomegaly and vascular pedicle widening. IMPRESSION:  Pleural fluid/pulmonary opacification at the bases which has progressed from 3 days ago. Electronically Signed   By: Monte Fantasia M.D.   On: 10/26/2020 06:02   DG CHEST PORT 1 VIEW  Result Date: 10/23/2020 CLINICAL DATA:  Shortness of breath. EXAM: PORTABLE CHEST 1 VIEW COMPARISON:  October 22, 2020 FINDINGS: The near complete opacification of the left hemithorax seen on the comparison chest x-ray has resolved. Persistent bibasilar opacities are likely atelectasis. No pneumothorax. A tracheostomy tube is in good position. An NG tube terminates in the stomach. No other acute abnormalities. IMPRESSION: 1. Support apparatus as above. 2. Bibasilar atelectasis and a possible small left effusion. Improved aeration of the left lung. Electronically Signed   By: Dorise Bullion III M.D   On: 10/23/2020 14:57   DG Chest Port 1 View  Result Date: 10/22/2020 CLINICAL DATA:  Acute respiratory failure. EXAM: PORTABLE CHEST 1 VIEW COMPARISON:  10/20/2020 FINDINGS: Tracheostomy tube overlies the airway. The cardiac silhouette is enlarged. There is persistent mild central pulmonary vascular congestion without overt edema. Airspace opacities in the left greater than right mid and lower lungs have slightly improved. Small pleural effusions are questioned. No pneumothorax  is identified. IMPRESSION: Slight improvement of bilateral airspace opacities which could reflect pneumonia or atelectasis. Electronically Signed   By: [name] M.D.   On: 10/22/2020 09:16   DG Chest Port 1 View  Result Date: 10/20/2020 CLINICAL DATA:  Questionable sepsis EXAM: PORTABLE CHEST 1 VIEW COMPARISON:  Chest x-ray 06/12/2018 report without images. FINDINGS: Tracheostomy tube terminates approximately 7.5 cm above the carina. The heart size and mediastinal contours are not well visualized. Bilateral hilar vasculature prominence. Aortic arch calcifications. Silhouetting off of the left hemidiaphragm and left cardiac border. Partial  silhouetting off the right cardiac border and right hemidiaphragm. Increased interstitial markings. Likely at least trace to small volume left pleural effusion. No pneumothorax. No acute osseous abnormality. IMPRESSION: 1. Left lung opacity and likely at least trace to small volume left pleural effusion with silhouetting off of the left diaphragm and heart border. Concern for infection/inflammation. 2. Right lower lobe and middle lobe airspace opacity could represent a combination of atelectasis, infection, inflammation. 3. At least mild pulmonary edema. 4. Consider chest x-ray PA and lateral view for further evaluation. Electronically Signed   By: Iven Finn M.D.   On: 10/20/2020 12:26   DG Chest Port 1V same Day  Result Date: 10/22/2020 CLINICAL DATA:  Status post NG tube placement. EXAM: PORTABLE CHEST 1 VIEW COMPARISON:  Single-view of the chest earlier today. FINDINGS: The upper chest is off the margin of the film. NG tube is looped in the upper trachea or esophagus. Visualized left chest is completely whited out on this study. IMPRESSION: NG tube is looped in the upper esophagus or trachea. Complete whiteout of the left chest is new since the study earlier today could be due to mucous plugging and left lung collapse or rapidly developing pleural effusion and airspace disease. Electronically Signed   By: [name] M.D.   On: 10/22/2020 15:16   DG Abd Portable 1V  Result Date: 10/22/2020 CLINICAL DATA:  Nasogastric tube placement EXAM: PORTABLE ABDOMEN - 1 VIEW COMPARISON:  Portable exam 1613 hours compared to 1523 hours FINDINGS: Tip of nasogastric tube projects over distal gastric antrum, consider withdrawal 5 cm. Bowel gas pattern normal. Opacification of the inferior LEFT hemithorax again seen. IMPRESSION: Tip of nasogastric tube projects over distal gastric antrum. Consider withdrawal of nasogastric tube 5 cm. Electronically Signed   By: [name] M.D.   On: 10/22/2020 16:36   DG  Abd Portable 1V  Result Date: 10/22/2020 CLINICAL DATA:  Nasogastric tube placement EXAM: PORTABLE ABDOMEN - 1 VIEW COMPARISON:  None. FINDINGS: Nasogastric tube extends into the gastric cardia where it loops upon itself with the tip at the junction of the mid and distal thirds of the esophagus. There is no bowel dilatation or air-fluid level to suggest bowel obstruction. No free air. There is consolidation left lower lobe. IMPRESSION: Nasogastric tube extends into the stomach where it loops upon itself with the tip at the junction of the mid and distal thirds of the esophagus. No bowel obstruction or free air evident. Consolidation left lower lobe. These results will be called to the ordering clinician or representative by the Radiologist Assistant, and communication documented in the PACS or [employer]. Electronically Signed   By: Lowella Grip III M.D.   On: 10/22/2020 15:34   ECHOCARDIOGRAM COMPLETE  Result Date: 10/21/2020    ECHOCARDIOGRAM REPORT   Patient Name:   WES LEZOTTE Date of Exam: 10/21/2020 Medical Rec #:  [id number]  Height:       68.0 in Accession #:    [id number]       Weight:       325.8 lb Date of Birth:  06/18/1966        BSA:          2.515 m Patient Age:    54 years         BP:           112/61 mmHg Patient Gender: M                HR:           51 bpm. Exam Location:  [place] Procedure: 2D Echo Indications:    CHF-Acute Diastolic I50.31  History:        Patient has no prior history of Echocardiogram examinations.                 CHF; Risk Factors:Non-Smoker. Acute on chronic respiratory                 failure with hypoxia.  Sonographer:    Johanna Elliott RDCS (AE) Referring Phys: [id number] DAVID TAT IMPRESSIONS  1. Left ventricular ejection fraction, by estimation, is 60 to 65%. The left ventricle has normal function. The left ventricle has no regional wall motion abnormalities. There is severe left ventricular hypertrophy. Left ventricular diastolic parameters   are indeterminate. There is the interventricular septum is flattened in systole and diastole, consistent with right ventricular pressure and volume overload.  2. Right ventricular systolic function is mildly reduced. The right ventricular size is mildly enlarged. Tricuspid regurgitation signal is inadequate for assessing PA pressure.  3. Right atrial size was upper normal.  4. The pericardial effusion is posterior to the left ventricle.  5. The mitral valve is grossly normal. Trivial mitral valve regurgitation.  6. The aortic valve is tricuspid. Aortic valve regurgitation is not visualized.  7. The inferior vena cava is normal in size with greater than 50% respiratory variability, suggesting right atrial pressure of 3 mmHg. FINDINGS  Left Ventricle: Left ventricular ejection fraction, by estimation, is 60 to 65%. The left ventricle has normal function. The left ventricle has no regional wall motion abnormalities. The left ventricular internal cavity size was normal in size. There is  severe left ventricular hypertrophy. The interventricular septum is flattened in systole and diastole, consistent with right ventricular pressure and volume overload. Left ventricular diastolic parameters are indeterminate. Right Ventricle: The right ventricular size is mildly enlarged. No increase in right ventricular wall thickness. Right ventricular systolic function is mildly reduced. Tricuspid regurgitation signal is inadequate for assessing PA pressure. Left Atrium: Left atrial size was normal in size. Right Atrium: Right atrial size was upper normal. Pericardium: Trivial pericardial effusion is present. The pericardial effusion is posterior to the left ventricle. Mitral Valve: The mitral valve is grossly normal. Trivial mitral valve regurgitation. Tricuspid Valve: The tricuspid valve is grossly normal. Tricuspid valve regurgitation is mild. Aortic Valve: The aortic valve is tricuspid. There is moderate aortic valve annular  calcification. Aortic valve regurgitation is not visualized. Pulmonic Valve: The pulmonic valve was grossly normal. Pulmonic valve regurgitation is trivial. Aorta: The aortic root is normal in size and structure. Venous: The inferior vena cava is normal in size with greater than 50% respiratory variability, suggesting right atrial pressure of 3 mmHg. IAS/Shunts: No atrial level shunt detected by color flow Doppler.  LEFT VENTRICLE PLAX 2D LVIDd:           4.66 cm  Diastology LVIDs:         2.03 cm  LV e' medial:    5.87 cm/s LV PW:         1.76 cm  LV E/e' medial:  14.0 LV IVS:        1.73 cm  LV e' lateral:   8.49 cm/s LVOT diam:     1.90 cm  LV E/e' lateral: 9.7 LVOT Area:     2.84 cm  RIGHT VENTRICLE RV S prime:     19.40 cm/s TAPSE (M-mode): 1.9 cm LEFT ATRIUM             Index       RIGHT ATRIUM           Index LA diam:        3.50 cm 1.39 cm/m  RA Area:     24.60 cm LA Vol (A2C):   43.9 ml 17.46 ml/m RA Volume:   82.30 ml  32.73 ml/m LA Vol (A4C):   43.8 ml 17.42 ml/m LA Biplane Vol: 44.4 ml 17.66 ml/m   AORTA Ao Root diam: 3.10 cm MITRAL VALVE MV Area (PHT): 2.64 cm    SHUNTS MV Decel Time: 287 msec    Systemic Diam: 1.90 cm MV E velocity: 82.30 cm/s MV A velocity: 59.10 cm/s MV E/A ratio:  1.39 Samuel Mcdowell MD Electronically signed by Samuel Mcdowell MD Signature Date/Time: 10/21/2020/4:33:43 PM    Final     Courage Emokpae, MD  [facility]  If 7PM-7AM, please contact night-coverage [email or web address] Password [email or web address] 11/09/2020, 4:32 PM   LOS: 20 days   

## 2020-11-10 ENCOUNTER — Ambulatory Visit (HOSPITAL_COMMUNITY): Payer: Medicare Other

## 2020-11-10 ENCOUNTER — Ambulatory Visit (HOSPITAL_COMMUNITY)
Admission: RE | Admit: 2020-11-10 | Discharge: 2020-11-10 | Disposition: A | Discharge status: Home / Self Care | Payer: Medicare Other | Source: Ambulatory Visit | Attending: Family Medicine | Admitting: Family Medicine

## 2020-11-10 DIAGNOSIS — Z431 Encounter for attention to gastrostomy: Secondary | ICD-10-CM | POA: Insufficient documentation

## 2020-11-10 DIAGNOSIS — R1310 Dysphagia, unspecified: Secondary | ICD-10-CM | POA: Diagnosis not present

## 2020-11-10 HISTORY — PX: IR GASTROSTOMY TUBE MOD SED: IMG625

## 2020-11-10 LAB — RENAL FUNCTION PANEL
Albumin: 2.4 g/dL — ABNORMAL LOW (ref 3.5–5.0)
Anion gap: 9 (ref 5–15)
BUN: 82 mg/dL — ABNORMAL HIGH (ref 6–20)
CO2: 29 mmol/L (ref 22–32)
Calcium: 10.6 mg/dL — ABNORMAL HIGH (ref 8.9–10.3)
Chloride: 100 mmol/L (ref 98–111)
Creatinine, Ser: 1.78 mg/dL — ABNORMAL HIGH (ref 0.61–1.24)
GFR, Estimated: 45 mL/min — ABNORMAL LOW (ref >60)
Glucose, Bld: 95 mg/dL (ref 70–99)
Phosphorus: 5.3 mg/dL — ABNORMAL HIGH (ref 2.5–4.6)
Potassium: 3.8 mmol/L (ref 3.5–5.1)
Sodium: 138 mmol/L (ref 135–145)

## 2020-11-10 LAB — CBC
HCT: 38.2 % — ABNORMAL LOW (ref 39.0–52.0)
Hemoglobin: 11.2 g/dL — ABNORMAL LOW (ref 13.0–17.0)
MCH: 29.1 pg (ref 26.0–34.0)
MCHC: 29.3 g/dL — ABNORMAL LOW (ref 30.0–36.0)
MCV: 99.2 fL (ref 80.0–100.0)
Platelets: 304 10*3/uL (ref 150–400)
RBC: 3.85 MIL/uL — ABNORMAL LOW (ref 4.22–5.81)
RDW: 14.3 % (ref 11.5–15.5)
WBC: 4.9 10*3/uL (ref 4.0–10.5)
nRBC: 0 % (ref 0.0–0.2)

## 2020-11-10 LAB — GLUCOSE, CAPILLARY
Glucose-Capillary: 106 mg/dL — ABNORMAL HIGH (ref 70–99)
Glucose-Capillary: 84 mg/dL (ref 70–99)
Glucose-Capillary: 87 mg/dL (ref 70–99)
Glucose-Capillary: 80 mg/dL (ref 70–99)

## 2020-11-10 LAB — HEPARIN LEVEL (UNFRACTIONATED): Heparin Unfractionated: 0.43 IU/mL (ref 0.30–0.70)

## 2020-11-10 MED ORDER — MIDAZOLAM HCL 2 MG/2ML IJ SOLN
INTRAMUSCULAR | Status: AC
  Filled 2020-11-10: qty 2

## 2020-11-10 MED ORDER — IOHEXOL 300 MG/ML  SOLN
50.0000 mL | Freq: Once | INTRAMUSCULAR | Status: AC | PRN
  Administered 2020-11-10: 15 mL

## 2020-11-10 MED ORDER — CEFAZOLIN SODIUM-DEXTROSE 2-4 GM/100ML-% IV SOLN
INTRAVENOUS | Status: AC
  Filled 2020-11-10: qty 100

## 2020-11-10 MED ORDER — GLUCAGON HCL RDNA (DIAGNOSTIC) 1 MG IJ SOLR
INTRAMUSCULAR | Status: AC
  Filled 2020-11-10: qty 1

## 2020-11-10 MED ORDER — FENTANYL CITRATE (PF) 100 MCG/2ML IJ SOLN
INTRAMUSCULAR | Status: AC
  Filled 2020-11-10: qty 2

## 2020-11-10 MED ORDER — LIDOCAINE HCL 1 % IJ SOLN
INTRAMUSCULAR | Status: AC
  Filled 2020-11-10: qty 20

## 2020-11-10 MED ORDER — FENTANYL CITRATE (PF) 100 MCG/2ML IJ SOLN
INTRAMUSCULAR | Status: AC | PRN
Start: 2020-11-10 — End: 2020-11-10
  Administered 2020-11-10 (×2): 25 ug via INTRAVENOUS
  Administered 2020-11-10: 50 ug via INTRAVENOUS

## 2020-11-10 MED ORDER — CEFAZOLIN SODIUM-DEXTROSE 2-4 GM/100ML-% IV SOLN
2.0000 g | Freq: Once | INTRAVENOUS | Status: AC
  Administered 2020-11-10: 2 g via INTRAVENOUS

## 2020-11-10 MED ORDER — BACITRACIN-NEOMYCIN-POLYMYXIN 400-5-5000 EX OINT
1.0000 "application " | TOPICAL_OINTMENT | Freq: Every day | CUTANEOUS | Status: DC
  Administered 2020-11-11 – 2020-11-12 (×2): 1 via TOPICAL
  Filled 2020-11-10: qty 1

## 2020-11-10 MED ORDER — ALBUTEROL SULFATE (2.5 MG/3ML) 0.083% IN NEBU
2.5000 mg | INHALATION_SOLUTION | Freq: Three times a day (TID) | RESPIRATORY_TRACT | Status: DC
  Administered 2020-11-10 – 2020-11-12 (×7): 2.5 mg via RESPIRATORY_TRACT
  Filled 2020-11-10 (×7): qty 3

## 2020-11-10 MED ORDER — MIDAZOLAM HCL 2 MG/2ML IJ SOLN
INTRAMUSCULAR | Status: AC | PRN
Start: 2020-11-10 — End: 2020-11-10
  Administered 2020-11-10: 0.5 mg via INTRAVENOUS
  Administered 2020-11-10: 1 mg via INTRAVENOUS

## 2020-11-10 NOTE — Progress Notes (Addendum)
PROGRESS NOTE  Jack Cox ZYY:[id number] DOB: 07-31-1966 DOA: 10/20/2020 PCP: [name], MD   Brief History: 55 y.o.malewith medical history ofdiastolic CHF, atrial flutter, obstructive sleep apnea, hypertension, right bundle branch block presenting with shortness of breath for the last 2 to 3 days. Unfortunately, the patient is encephalopathic at the time of my evaluation; therefore, history is limited. Apparently, the patient went to see his nephrologist earlier on 10/20/2020. He was noted to have oxygen saturation in the 60s. As result, he was sent to the emergency department for further evaluation. Initially, the patient was awake and conversant with oxygen saturation in the low 70s. He was placed on a nonrebreather after which the patient became obtunded. VBG showed pH 7.1 66/97/106 on 80%. Subsequently, the patient was placed on the ventilator with PRVC,PEEP of 5, FiO2 70%. After approximately 10 to 15 minutes on the ventilator, the patient remained somnolent, but awakened and was able to tell me his name. He did state that he has not been taking his torsemide as directed. In addition, the patient has been sleeping in a recliner for an unclear duration of time secondary to shortness of breath. At baseline, the patient has a size 4 noncuffed tracheostomy. He is on supplemental oxygen 2.5 L during the day and 4 L at nighttime.  There is no reports of nausea, vomiting, diarrhea, chest pain, abdominal pain.  In the emergency department, the patient was afebrile and hemodynamically stable with oxygen saturation 93-94% on the ventilator with FiO2 70%. Chest x-ray showed left-sided pleural effusion with increased interstitial markings. BNP 790. PCT 0.12. BMP showed a serum creatinine 2.43. LFTs were unremarkable. WBC 8.1, hemoglobin 14.5, platelets 253,000. The patient was given ceftriaxone and azithromycin. He was also given furosemide 80 mg IV x1. COVID-19  RT-PCR negative.  He was continued on IV lasix 80 mg bid with uptrending of his serum creatinine.  Renal was consulted to assist.   Assessment/Plan: Acute on chronic respiratory failure with hypoxia and hypercarbia -Secondary to CHF in the setting of OSA -having mucus plugging--needs aggressive suctioning -12/17 and 12/26 CXR--white out of left lung likely mucus plugging -continue mucomyst and albuterol -10/26/20-bronchoscopy--Thick inspissated mucus in proximal airways & BL lower lobes, removed with suctioning - hypertonic saline nebulizer treatments started by pulmonology 12/22 Vent Mode: PCV FiO2 (%):  [55 %-80 %] 70 % Set Rate:  [20 bmp] 20 bmp PEEP:  [5 cmH20] 5 cmH20 Plateau Pressure:  [16 cmH20-19 cmH20] 19 cmH20  Acute on chronic diastolic CHF/Cor Pulmonale -06/25/2019 echo EF 55%, trivial TR -12/16/21echo--EF 60-65%, no WMA, RV pressure overload, mild TR -he has been diuresing well with no diuretic support -Following BMP -Accurate I's and O's--NEG 13L -Daily weights I/O last 3 completed shifts: In: 7948.8 [I.V.:2183.4; NG/GT:5765.3] Out: 4888 [Urine:4950] Total I/O In: 197.6 [I.V.:197.6] Out: 500 [Urine:500]  Atrial flutter - controlled -apixaban temporarily on hold, he is on an IV heparin infusion for full anticoagulation -rate controlledbut had an episode 3-4 hours 12/18-12/19 evening -resumed low dose metoprolol 25 mg BID per tube   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    Fever - RESOLVED -blood cultures x 2--neg to date -UA and urine culture--no significant pyuria -12/16 trach aspirate-->flora -10/26/20-->BAL sent for culture -completed course of cefepime  Essential hypertension -metoprolol  low dose with holding parameters -BPs have been controlled  OSA -The patient has a  tracheostomy placed in 2001 -He has a size 4 cuffless trach  Right bundle branch block -This has been chronic with review of the medical records  Morbid obesity -BMI 52.94 -lifestyle modification  GOC -palliative following -currently full scope of care  Status is: Inpatient  Remains inpatient appropriate because:IV treatments appropriate due to intensity of illness or inability to take PO  Dispo: The patient is from:Home Anticipated d/c is GU:RKYH Anticipated d/c date is: 2 days Patient currently is medically stable to d/c to LTAC.    Family Communication:updates at bedside  Consultants:pulm, renal  Code Status: FULL  DVT Prophylaxis: IV heparin infusion for full anticoagulation   Procedures: As Listed in Progress Note Above  Antibiotics: Cefepime 12/21>>12/27  Subjective: Patient awake, no specific complaints.  Objective: Vitals:   11/10/20 0719 11/10/20 0800 11/10/20 0900 11/10/20 1000  BP:  (!) 151/95 (!) 123/53 135/66  Pulse:  66 70 82  Resp:  _0 Temp: 98.7 F (37.1 C)     TempSrc: Oral     SpO2:  93% 91% 94%  Weight:      Height:        Intake/Output Summary (Last 24 hours) at 11/10/2020 1105 Last data filed at 11/10/2020 1031 Gross per 24 hour  Intake 4002.55 ml  Output 3275 ml  Net 727.55 ml   Weight change: -1.4 kg Exam:  General - He is awake and alert, no apparent distress.   Neck - trach collar in place.  Lungs: BBS clear to ausculation. No increased WOB.  CV - normal s1,s2 sounds. No MRG.  Abd - obese, soft, no masses palpated.   Ext - trace pretibial edema BLEs.  Neuro - nonfocal exam.   Data Reviewed: I have personally reviewed following labs and imaging studies  Basic Metabolic Panel: Recent Labs  Lab 11/06/20 0408 11/07/20 0453 11/08/20 0246 11/09/20 0422 11/10/20 0458  NA 138 137 137 137 138  K 4.2 4.4 4.1 4.0 3.8  CL 103 101 101 100 100  CO2 _1 GLUCOSE 126* 127* 127* 112* 95  BUN 92* 93* 91* 85* 82*  CREATININE 2.06* 1.98* 1.84* 1.72* 1.78*  CALCIUM 10.4* 10.2 10.2 10.7* 10.6*  PHOS 4.1 5.3* 4.6 4.5 5.3*   Liver Function Tests: Recent Labs  Lab 11/06/20 0408 11/07/20 0453 11/08/20 0246 11/09/20 0422 11/10/20 0458  ALBUMIN 2.3* 2.5* 2.4* 2.5* 2.4*   No results for input(s): LIPASE, AMYLASE in the last 168 hours. No results for input(s): AMMONIA in the last 168 hours. Coagulation Profile: Recent Labs  Lab 11/09/20 1356  INR 1.2   CBC: Recent Labs  Lab 11/06/20 0408 11/07/20 0453 11/08/20 0246 11/09/20 0422 11/10/20 0458  WBC 7.3 6.1 4.9 4.8 4.9  HGB 11.6* 11.9* 12.0* 11.4* 11.2*  HCT 39.2 40.1 40.1 37.9* 38.2*  MCV 99.0 99.3 98.3 97.9 99.2  PLT 261 277 255 305 304   Cardiac Enzymes: No results for input(s): CKTOTAL, CKMB, CKMBINDEX, TROPONINI in the last 168 hours. BNP: Invalid input(s): POCBNP CBG: Recent Labs  Lab 11/09/20 1608 11/09/20 1931 11/09/20 2333 11/10/20 0344 11/10/20 0724  GLUCAP 98 99 96 106* 84   HbA1C: No results for input(s): HGBA1C in the last 72 hours. Urine analysis:    Component Value Date/Time   COLORURINE YELLOW 10/28/2020 0429   APPEARANCEUR HAZY (A) 10/28/2020 0429   LABSPEC 1.014 10/28/2020 0623  PHURINE 5.0 10/28/2020 0429   GLUCOSEU NEGATIVE 10/28/2020 0429   HGBUR SMALL (A) 10/28/2020 0429   BILIRUBINUR NEGATIVE 10/28/2020 0429   KETONESUR NEGATIVE 10/28/2020 0429   PROTEINUR 100 (A) 10/28/2020 0429   NITRITE NEGATIVE 10/28/2020 0429   LEUKOCYTESUR NEGATIVE 10/28/2020 0429    Recent Results (from the past 240 hour(s))  Culture, respiratory (non-expectorated)     Status: None   Collection Time: 11/05/20 11:46 AM   Specimen: Tracheal Aspirate; Respiratory  Result Value Ref Range Status   Specimen Description   Final    TRACHEAL ASPIRATE Performed at [facility], [address], [place], [place] [address]    Special Requests   Final     NONE Performed at [facility], [address], [place], [place] [address]    Gram Stain   Final    MODERATE WBC PRESENT,BOTH PMN AND MONONUCLEAR FEW GRAM POSITIVE COCCI FEW GRAM NEGATIVE RODS RARE GRAM POSITIVE RODS    Culture   Final    FEW Normal respiratory flora-no Staph aureus or Pseudomonas seen Performed at [facility], [address], [place],  [address]    Report Status 11/08/2020 FINAL  Final     Scheduled Meds: . acetylcysteine  1 mL Nebulization BID  . albuterol  2.5 mg Nebulization TID  . chlorhexidine gluconate (MEDLINE KIT)  15 mL Mouth Rinse BID  . Chlorhexidine Gluconate Cloth  6 each Topical Daily  . cloNIDine  0.2 mg Per Tube TID  . feeding supplement (PROSource TF)  90 mL Per Tube TID  . free water  400 mL Per Tube Q4H  . mouth rinse  15 mL Mouth Rinse 2 times per day  . metoprolol tartrate  25 mg Per Tube BID  . pantoprazole sodium  40 mg Per Tube Q1200  . sodium chloride flush  3 mL Intravenous Q12H   Continuous Infusions: . sodium chloride    . sodium chloride 250 mL (11/02/20 1546)  . amiodarone    . dexmedetomidine (PRECEDEX) IV infusion 0.4 mcg/kg/hr (11/10/20 1040)  . feeding supplement (VITAL HIGH PROTEIN) 1,000 mL (11/09/20 1530)  . heparin Stopped (11/10/20 0915)   Procedures/Studies: CT ABDOMEN WO CONTRAST  Result Date: 11/08/2020 CLINICAL DATA:  55 year old male undergoing anatomic evaluation for possible percutaneous gastrostomy tube placement. EXAM: CT ABDOMEN WITHOUT CONTRAST TECHNIQUE: Multidetector CT imaging of the abdomen was performed following the standard protocol without IV contrast. COMPARISON:  Prior CT scan of the abdomen and pelvis 06/12/2018 FINDINGS: Lower chest: Small right and trace left pleural effusions. Small 0.4 cm nodule in the right middle lobe, unchanged compared to prior imaging dating back to 2019 and therefore benign. Combination of volume loss/atelectasis and consolidative changes present in both lower  lobes. Cardiomegaly. No pericardial effusion. Calcifications present along the coronary arteries. Hepatobiliary: Nodular hepatic contour with relative hypertrophy of the left hepatic lobe. These findings are morphologically compatible with cirrhosis. No definite discrete mass within the limitations of noncontrast enhanced technique. Gallbladder is unremarkable. No intra or extrahepatic biliary ductal dilatation. Pancreas: Unremarkable. No pancreatic ductal dilatation or surrounding inflammatory changes. Spleen: Normal in size without focal abnormality. Adrenals/Urinary Tract: Unremarkable adrenal glands. Nonspecific perinephric stranding bilaterally. No hydronephrosis or nephrolithiasis. Stomach/Bowel: Gastric tube terminates in the stomach. Normal anatomic position of the stomach without evidence of colonic interposition or other complicating feature. Colonic diverticular disease without CT evidence of active inflammation. Vascular/Lymphatic: Limited evaluation in the absence of intravenous contrast. Mild scattered atherosclerotic vascular calcifications. No aneurysm. No suspicious  lymphadenopathy. Other: No evidence of ascites. Musculoskeletal: No acute fracture or aggressive appearing lytic or blastic osseous lesion. IMPRESSION: 1. Anatomy is suitable for percutaneous gastrostomy tube placement. 2. Small right and trace left pleural effusions with associated bibasilar atelectasis and consolidation which may reflect pneumonia or aspiration. 3. Cardiomegaly. 4. Coronary artery calcifications. 5. Hepatic cirrhosis. 6. Colonic diverticular disease without CT evidence of active inflammation. 7.  Aortic Atherosclerosis (ICD10-I70.0). Electronically Signed   By: Jacqulynn Cadet M.D.   On: 11/08/2020 15:46   US RENAL  Result Date: 10/28/2020 CLINICAL DATA:  Chronic kidney disease.  Acute kidney injury. EXAM: RENAL / URINARY TRACT ULTRASOUND COMPLETE COMPARISON:  CT 06/12/2018 FINDINGS: Right Kidney: Renal  measurements: 11.0 x 6.4 x 5.2 cm = volume: 190 mL. Increased echogenicity. No focal lesion or hydronephrosis. Left Kidney: Renal measurements: 11.2 x 7.6 x 6.2 cm = volume: 276 mL. Increased echogenicity. No focal lesion or hydronephrosis. Bladder: Foley catheter within the bladder. Other: None. IMPRESSION: Slightly enlarged kidneys showing increased echogenicity. Findings could be due to chronic diabetic glomerulonephropathy or acute nephritis. No obstruction or focal lesion. Electronically Signed   By: Nelson Chimes M.D.   On: 10/28/2020 14:30   DG Chest Port 1 View  Result Date: 11/09/2020 CLINICAL DATA:  Respiratory failure EXAM: PORTABLE CHEST 1 VIEW COMPARISON:  11/05/2020 FINDINGS: Tracheostomy and nasogastric tube extending into the upper abdomen beyond the margin of the examination are again identified. Lung volumes are small and pulmonary insufflation has diminished since prior examination. Mild left basilar atelectasis or infiltrate. No pneumothorax or pleural effusion. Mild cardiomegaly is stable. Pulmonary vascularity is normal when accounting for poor pulmonary insufflation. No acute bone abnormality. IMPRESSION: Stable support tubes. Progressive pulmonary hypoinflation. Probable developing retrocardiac atelectasis. Stable cardiomegaly Electronically Signed   By: Fidela Salisbury MD   On: 11/09/2020 04:52   DG Chest Port 1 View  Result Date: 11/05/2020 CLINICAL DATA:  Respiratory failure EXAM: PORTABLE CHEST 1 VIEW COMPARISON:  11/02/2020 FINDINGS: Tracheostomy and NG tube are unchanged. Cardiomegaly. Mild vascular congestion. Bilateral airspace opacities again noted, worse at the left base. This is stable since prior study. No visible effusions or pneumothorax. IMPRESSION: Cardiomegaly. Bilateral airspace disease, most pronounced in the left base, stable since prior study. Electronically Signed   By: Rolm Baptise M.D.   On: 11/05/2020 08:03   DG CHEST PORT 1 VIEW  Result Date:  11/02/2020 CLINICAL DATA:  NG tube placement. EXAM: PORTABLE CHEST 1 VIEW COMPARISON:  Radiograph yesterday. FINDINGS: The tip of the enteric tube is below the diaphragm, not included in the field of view. The side-port is not well visualized but likely below the diaphragm. Stable cardiomegaly. Patchy airspace opacities in both lung bases, unchanged. IMPRESSION: 1. Tip of the enteric tube below the diaphragm, not included in the field of view. 2. Unchanged patchy bibasilar airspace opacities. Electronically Signed   By: Keith Rake M.D.   On: 11/02/2020 21:51   DG CHEST PORT 1 VIEW  Result Date: 11/01/2020 CLINICAL DATA:  Acute on chronic respiratory failure. EXAM: PORTABLE CHEST 1 VIEW COMPARISON:  10/31/2020 FINDINGS: The tracheostomy tube is stable. The NG tube is stable. Much improved left lung aeration when compared to the prior study. The left upper lobe is now aerated. Persistent airspace process in the right lung and at the left lung base. No pleural effusions or pneumothorax. IMPRESSION: 1. Much improved left lung aeration when compared to the prior study. 2. Persistent right lung and left basilar airspace process. Electronically  Signed   By: Marijo Sanes M.D.   On: 11/01/2020 05:55   DG CHEST PORT 1 VIEW  Result Date: 10/31/2020 CLINICAL DATA:  Acute on chronic respiratory EXAM: PORTABLE CHEST 1 VIEW COMPARISON:  Failure four days ago FINDINGS: Enteric tube which reaches the stomach. Tracheostomy tube in place. Interval white out of the left chest with volume loss. Progressive hazy opacification of the right lower chest. Heart size is largely obscured. No visible air leak. IMPRESSION: 1. Interval white out of the left chest, likely plugging and pulmonary collapse. 2. Progressive airspace disease or pleural effusion on the right. Electronically Signed   By: Monte Fantasia M.D.   On: 10/31/2020 04:22   DG Chest Port 1 View  Result Date: 10/27/2020 CLINICAL DATA:  Acute respiratory  failure EXAM: PORTABLE CHEST 1 VIEW COMPARISON:  Yesterday FINDINGS: Enteric and tracheostomy tubes remain in good position. Cardiomegaly. Interstitial opacity which could be bronchitic or congestive. There is improved aeration at the right base. Negative for pneumothorax. IMPRESSION: 1. Improved aeration. 2. Bronchitic or congestive interstitial opacity. Electronically Signed   By: Monte Fantasia M.D.   On: 10/27/2020 07:46   DG Chest Port 1 View  Result Date: 10/26/2020 CLINICAL DATA:  Shortness of breath for 2-3 days EXAM: PORTABLE CHEST 1 VIEW COMPARISON:  Three days ago FINDINGS: Progressive hazy appearance of the right mid to lower chest. Dense retrocardiac opacity persists. An enteric tube and tracheostomy tube remain in place. Cardiomegaly and vascular pedicle widening. IMPRESSION: Pleural fluid/pulmonary opacification at the bases which has progressed from 3 days ago. Electronically Signed   By: Monte Fantasia M.D.   On: 10/26/2020 06:02   DG CHEST PORT 1 VIEW  Result Date: 10/23/2020 CLINICAL DATA:  Shortness of breath. EXAM: PORTABLE CHEST 1 VIEW COMPARISON:  October 22, 2020 FINDINGS: The near complete opacification of the left hemithorax seen on the comparison chest x-ray has resolved. Persistent bibasilar opacities are likely atelectasis. No pneumothorax. A tracheostomy tube is in good position. An NG tube terminates in the stomach. No other acute abnormalities. IMPRESSION: 1. Support apparatus as above. 2. Bibasilar atelectasis and a possible small left effusion. Improved aeration of the left lung. Electronically Signed   By: Dorise Bullion III M.D   On: 10/23/2020 14:57   DG Chest Port 1 View  Result Date: 10/22/2020 CLINICAL DATA:  Acute respiratory failure. EXAM: PORTABLE CHEST 1 VIEW COMPARISON:  10/20/2020 FINDINGS: Tracheostomy tube overlies the airway. The cardiac silhouette is enlarged. There is persistent mild central pulmonary vascular congestion without overt edema.  Airspace opacities in the left greater than right mid and lower lungs have slightly improved. Small pleural effusions are questioned. No pneumothorax is identified. IMPRESSION: Slight improvement of bilateral airspace opacities which could reflect pneumonia or atelectasis. Electronically Signed   By: [name] M.D.   On: 10/22/2020 09:16   DG Chest Port 1 View  Result Date: 10/20/2020 CLINICAL DATA:  Questionable sepsis EXAM: PORTABLE CHEST 1 VIEW COMPARISON:  Chest x-ray 06/12/2018 report without images. FINDINGS: Tracheostomy tube terminates approximately 7.5 cm above the carina. The heart size and mediastinal contours are not well visualized. Bilateral hilar vasculature prominence. Aortic arch calcifications. Silhouetting off of the left hemidiaphragm and left cardiac border. Partial silhouetting off the right cardiac border and right hemidiaphragm. Increased interstitial markings. Likely at least trace to small volume left pleural effusion. No pneumothorax. No acute osseous abnormality. IMPRESSION: 1. Left lung opacity and likely at least trace to small volume left pleural effusion  with silhouetting off of the left diaphragm and heart border. Concern for infection/inflammation. 2. Right lower lobe and middle lobe airspace opacity could represent a combination of atelectasis, infection, inflammation. 3. At least mild pulmonary edema. 4. Consider chest x-ray PA and lateral view for further evaluation. Electronically Signed   By: Iven Finn M.D.   On: 10/20/2020 12:26   DG Chest Port 1V same Day  Result Date: 10/22/2020 CLINICAL DATA:  Status post NG tube placement. EXAM: PORTABLE CHEST 1 VIEW COMPARISON:  Single-view of the chest earlier today. FINDINGS: The upper chest is off the margin of the film. NG tube is looped in the upper trachea or esophagus. Visualized left chest is completely whited out on this study. IMPRESSION: NG tube is looped in the upper esophagus or trachea. Complete whiteout  of the left chest is new since the study earlier today could be due to mucous plugging and left lung collapse or rapidly developing pleural effusion and airspace disease. Electronically Signed   By: [name] M.D.   On: 10/22/2020 15:16   DG Abd Portable 1V  Result Date: 10/22/2020 CLINICAL DATA:  Nasogastric tube placement EXAM: PORTABLE ABDOMEN - 1 VIEW COMPARISON:  Portable exam 1613 hours compared to 1523 hours FINDINGS: Tip of nasogastric tube projects over distal gastric antrum, consider withdrawal 5 cm. Bowel gas pattern normal. Opacification of the inferior LEFT hemithorax again seen. IMPRESSION: Tip of nasogastric tube projects over distal gastric antrum. Consider withdrawal of nasogastric tube 5 cm. Electronically Signed   By: [name] M.D.   On: 10/22/2020 16:36   DG Abd Portable 1V  Result Date: 10/22/2020 CLINICAL DATA:  Nasogastric tube placement EXAM: PORTABLE ABDOMEN - 1 VIEW COMPARISON:  None. FINDINGS: Nasogastric tube extends into the gastric cardia where it loops upon itself with the tip at the junction of the mid and distal thirds of the esophagus. There is no bowel dilatation or air-fluid level to suggest bowel obstruction. No free air. There is consolidation left lower lobe. IMPRESSION: Nasogastric tube extends into the stomach where it loops upon itself with the tip at the junction of the mid and distal thirds of the esophagus. No bowel obstruction or free air evident. Consolidation left lower lobe. These results will be called to the ordering clinician or representative by the Radiologist Assistant, and communication documented in the PACS or [employer]. Electronically Signed   By: Lowella Grip III M.D.   On: 10/22/2020 15:34   ECHOCARDIOGRAM COMPLETE  Result Date: 10/21/2020    ECHOCARDIOGRAM REPORT   Patient Name:   Jack Cox Date of Exam: 10/21/2020 Medical Rec #:  [id number]        Height:       68.0 in Accession #:    [id number]       Weight:        325.8 lb Date of Birth:  27-Oct-1966        BSA:          2.515 m Patient Age:    63 years         BP:           112/61 mmHg Patient Gender: M                HR:           51 bpm. Exam Location:  [facility] Procedure: 2D Echo Indications:    CHF-Acute Diastolic P59.16  History:        Patient has no  prior history of Echocardiogram examinations.                 CHF; Risk Factors:Non-Smoker. Acute on chronic respiratory                 failure with hypoxia.  Sonographer:    Leavy Cella RDCS (AE) Referring Phys: [id number] DAVID TAT IMPRESSIONS  1. Left ventricular ejection fraction, by estimation, is 60 to 65%. The left ventricle has normal function. The left ventricle has no regional wall motion abnormalities. There is severe left ventricular hypertrophy. Left ventricular diastolic parameters  are indeterminate. There is the interventricular septum is flattened in systole and diastole, consistent with right ventricular pressure and volume overload.  2. Right ventricular systolic function is mildly reduced. The right ventricular size is mildly enlarged. Tricuspid regurgitation signal is inadequate for assessing PA pressure.  3. Right atrial size was upper normal.  4. The pericardial effusion is posterior to the left ventricle.  5. The mitral valve is grossly normal. Trivial mitral valve regurgitation.  6. The aortic valve is tricuspid. Aortic valve regurgitation is not visualized.  7. The inferior vena cava is normal in size with greater than 50% respiratory variability, suggesting right atrial pressure of 3 mmHg. FINDINGS  Left Ventricle: Left ventricular ejection fraction, by estimation, is 60 to 65%. The left ventricle has normal function. The left ventricle has no regional wall motion abnormalities. The left ventricular internal cavity size was normal in size. There is  severe left ventricular hypertrophy. The interventricular septum is flattened in systole and diastole, consistent with right ventricular pressure  and volume overload. Left ventricular diastolic parameters are indeterminate. Right Ventricle: The right ventricular size is mildly enlarged. No increase in right ventricular wall thickness. Right ventricular systolic function is mildly reduced. Tricuspid regurgitation signal is inadequate for assessing PA pressure. Left Atrium: Left atrial size was normal in size. Right Atrium: Right atrial size was upper normal. Pericardium: Trivial pericardial effusion is present. The pericardial effusion is posterior to the left ventricle. Mitral Valve: The mitral valve is grossly normal. Trivial mitral valve regurgitation. Tricuspid Valve: The tricuspid valve is grossly normal. Tricuspid valve regurgitation is mild. Aortic Valve: The aortic valve is tricuspid. There is moderate aortic valve annular calcification. Aortic valve regurgitation is not visualized. Pulmonic Valve: The pulmonic valve was grossly normal. Pulmonic valve regurgitation is trivial. Aorta: The aortic root is normal in size and structure. Venous: The inferior vena cava is normal in size with greater than 50% respiratory variability, suggesting right atrial pressure of 3 mmHg. IAS/Shunts: No atrial level shunt detected by color flow Doppler.  LEFT VENTRICLE PLAX 2D LVIDd:         4.66 cm  Diastology LVIDs:         2.03 cm  LV e' medial:    5.87 cm/s LV PW:         1.76 cm  LV E/e' medial:  14.0 LV IVS:        1.73 cm  LV e' lateral:   8.49 cm/s LVOT diam:     1.90 cm  LV E/e' lateral: 9.7 LVOT Area:     2.84 cm  RIGHT VENTRICLE RV S prime:     19.40 cm/s TAPSE (M-mode): 1.9 cm LEFT ATRIUM             Index       RIGHT ATRIUM           Index LA diam:  3.50 cm 1.39 cm/m  RA Area:     24.60 cm LA Vol (A2C):   43.9 ml 17.46 ml/m RA Volume:   82.30 ml  32.73 ml/m LA Vol (A4C):   43.8 ml 17.42 ml/m LA Biplane Vol: 44.4 ml 17.66 ml/m   AORTA Ao Root diam: 3.10 cm MITRAL VALVE MV Area (PHT): 2.64 cm    SHUNTS MV Decel Time: 287 msec    Systemic Diam:  1.90 cm MV E velocity: 82.30 cm/s MV A velocity: 59.10 cm/s MV E/A ratio:  1.39 Rozann Lesches MD Electronically signed by Rozann Lesches MD Signature Date/Time: 10/21/2020/4:33:43 PM    Final    Time Spent: 36 minutes    Irwin Brakeman, MD  How to contact the [facility] Attending or Consulting provider [place] or covering provider during after hours [place], for this patient?  1. Check the care team in [facility] and look for a) attending/consulting [facility] provider listed and b) the [facility] team listed 2. Log into [email or web address] and use [place]'s universal password to access. If you do not have the password, please contact the hospital operator. 3. Locate the [facility] provider you are looking for under [facility] and page to a number that you can be directly reached. 4. If you still have difficulty reaching the provider, please page the [facility] (Director on Call) for the Hospitalists listed on amion for assistance.  11/10/2020, 11:05 AM   LOS: 21 days

## 2020-11-10 NOTE — Progress Notes (Signed)
Patient taken off of Vent at 0839 and placed on ATC at 80% FiO2 and 10L. Patient tolerated well all day.

## 2020-11-10 NOTE — Consult Note (Signed)
Chief Complaint: Patient was seen in consultation today for percutaneous gastric tube placement Chief Complaint  Patient presents with  . Shortness of Breath   at the request of Dr Maurene Capes   Supervising Physician: Corrie Mckusick  Patient Status: [facility] IP  History of Present Illness: Jack Cox is a 55 y.o. 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 CKD (chronic kidney disease)   . Congestive heart failure (CHF) ([place])   . Essential hypertension   . Fatigue   . Gout   . Hypercholesteremia   . Insomnia   . Proteinuria   . Renal insufficiency   . Shortness of breath   . Tracheostomy dependence ([place])     History reviewed. No pertinent surgical history.  Allergies: Patient has no known allergies.  Medications: Prior to Admission medications   Medication Sig Start Date End Date Taking? Authorizing Provider  amLODipine (NORVASC) 10 MG tablet Take 10 mg by mouth daily. 03/13/19  Yes [provider]  albuterol (VENTOLIN HFA) 108 (90 Base) MCG/ACT inhaler Inhale 1 puff into the lungs See admin instructions. Inhale 1 puff into the lungs every 4 to 6 hours as needed for shortness of breath 07/27/20   [provider]  allopurinol (ZYLOPRIM) 300 MG tablet Take 300 mg by mouth daily.    [provider]  apixaban (ELIQUIS) 2.5 MG TABS tablet Take by mouth.    [provider]  aspirin EC 81 MG tablet Take 81 mg by mouth daily.    [provider]  buPROPion [facility] SR) 100 MG 12 hr tablet Take by mouth. 07/03/18   [provider]  Cholecalciferol 25 MCG (1000 UT) tablet  Take by mouth.    [provider]  clonazePAM (KLONOPIN) 0.5 MG tablet Take by mouth. 11/13/17   [provider]  colchicine-probenecid 0.5-500 MG tablet Take by mouth. 10/07/19   [provider]  ferrous sulfate 325 (65 FE) MG tablet Take by mouth.    [provider]  furosemide (LASIX) 40 MG tablet Take 40 mg by mouth.    [provider]  hydrALAZINE (APRESOLINE) 25 MG tablet Take 25 mg by mouth 3 (three) times daily. 07/30/20   [provider]  levofloxacin (LEVAQUIN) 500 MG tablet Take 500 mg by mouth daily. 07/27/20   [provider]  lisinopril (PRINIVIL,ZESTRIL) 40 MG tablet Take 40 mg by mouth daily.    [provider]  meloxicam (MOBIC) 15 MG tablet Take 15 mg by mouth daily. 06/22/20   [provider]  metoprolol succinate (TOPROL-XL) 50 MG 24 hr tablet Take 50 mg by mouth daily. Take with or immediately following a meal.    [provider]  mirtazapine (REMERON) 30 MG tablet Take 30 mg by mouth at bedtime.    [provider]  Multiple Vitamins-Minerals (MULTIVITAMIN WITH MINERALS) tablet Take by mouth.    [provider]  torsemide (DEMADEX) 20 MG tablet Take by mouth. 06/19/19   [provider]  traMADol (ULTRAM) 50 MG tablet Take 50 mg by mouth 3 (three) times daily as needed. 06/22/20  [provider]     Family History  Problem Relation Age of Onset  . Stomach cancer Mother   . Colon cancer Father     Social History   Socioeconomic History  . Marital status: Married    Spouse name: Not on file  . Number of children: Not on file  . Years of education: Not on file  . Highest education level: Not on file  Occupational History  . Not on file  Tobacco Use  . Smoking status: Never Smoker  . Smokeless tobacco: Never Used  Vaping Use  . Vaping Use: Never used  Substance and Sexual Activity  . Alcohol use: No  . Drug use: No  . Sexual activity: Not on  file  Other Topics Concern  . Not on file  Social History Narrative  . Not on file   Social Determinants of Health   Financial Resource Strain: Not on file  Food Insecurity: Not on file  Transportation Needs: Not on file  Physical Activity: Not on file  Stress: Not on file  Social Connections: Not on file     Review of Systems: A 12 point ROS discussed and pertinent positives are indicated in the HPI above.  All other systems are negative.   Vital Signs: BP (!) 151/95   Pulse 66   Temp 98.7 F (37.1 C) (Oral)   Resp 19   Ht 5\' 7"  (1.702 m)   Wt (!) 315 lb 7.7 oz (143.1 kg)   SpO2 93%   BMI 49.41 kg/m   Physical Exam Vitals reviewed.  Cardiovascular:     Rate and Rhythm: Regular rhythm.  Pulmonary:     Breath sounds: Examination of the right-upper field reveals rhonchi. Examination of the left-upper field reveals rhonchi. Rhonchi present.     Comments: Vent/trach Abdominal:     Palpations: Abdomen is soft.     Tenderness: There is no abdominal tenderness.  Skin:    General: Skin is warm.  Neurological:     Comments: Able to mouth words - yes/no Able to follow few commands   Psychiatric:     Comments: Spoke to wife [name] via phone She consents for procedure     Imaging: CT ABDOMEN WO CONTRAST  Result Date: 11/08/2020 CLINICAL DATA:  55 year old male undergoing anatomic evaluation for possible percutaneous gastrostomy tube placement. EXAM: CT ABDOMEN WITHOUT CONTRAST TECHNIQUE: Multidetector CT imaging of the abdomen was performed following the standard protocol without IV contrast. COMPARISON:  Prior CT scan of the abdomen and pelvis 06/12/2018 FINDINGS: Lower chest: Small right and trace left pleural effusions. Small 0.4 cm nodule in the right middle lobe, unchanged compared to prior imaging dating back to 2019 and therefore benign. Combination of volume loss/atelectasis and consolidative changes present in both lower lobes. Cardiomegaly. No pericardial  effusion. Calcifications present along the coronary arteries. Hepatobiliary: Nodular hepatic contour with relative hypertrophy of the left hepatic lobe. These findings are morphologically compatible with cirrhosis. No definite discrete mass within the limitations of noncontrast enhanced technique. Gallbladder is unremarkable. No intra or extrahepatic biliary ductal dilatation. Pancreas: Unremarkable. No pancreatic ductal dilatation or surrounding inflammatory changes. Spleen: Normal in size without focal abnormality. Adrenals/Urinary Tract: Unremarkable adrenal glands. Nonspecific perinephric stranding bilaterally. No hydronephrosis or nephrolithiasis. Stomach/Bowel: Gastric tube terminates in the stomach. Normal anatomic position of the stomach without evidence of colonic interposition or other complicating feature. Colonic diverticular disease without CT evidence of active inflammation. Vascular/Lymphatic: Limited evaluation in the absence of intravenous contrast. Mild  scattered atherosclerotic vascular calcifications. No aneurysm. No suspicious lymphadenopathy. Other: No evidence of ascites. Musculoskeletal: No acute fracture or aggressive appearing lytic or blastic osseous lesion. IMPRESSION: 1. Anatomy is suitable for percutaneous gastrostomy tube placement. 2. Small right and trace left pleural effusions with associated bibasilar atelectasis and consolidation which may reflect pneumonia or aspiration. 3. Cardiomegaly. 4. Coronary artery calcifications. 5. Hepatic cirrhosis. 6. Colonic diverticular disease without CT evidence of active inflammation. 7.  Aortic Atherosclerosis (ICD10-I70.0). Electronically Signed   By: Jacqulynn Cadet M.D.   On: 11/08/2020 15:46   US RENAL  Result Date: 10/28/2020 CLINICAL DATA:  Chronic kidney disease.  Acute kidney injury. EXAM: RENAL / URINARY TRACT ULTRASOUND COMPLETE COMPARISON:  CT 06/12/2018 FINDINGS: Right Kidney: Renal measurements: 11.0 x 6.4 x 5.2 cm = volume:  190 mL. Increased echogenicity. No focal lesion or hydronephrosis. Left Kidney: Renal measurements: 11.2 x 7.6 x 6.2 cm = volume: 276 mL. Increased echogenicity. No focal lesion or hydronephrosis. Bladder: Foley catheter within the bladder. Other: None. IMPRESSION: Slightly enlarged kidneys showing increased echogenicity. Findings could be due to chronic diabetic glomerulonephropathy or acute nephritis. No obstruction or focal lesion. Electronically Signed   By: Nelson Chimes M.D.   On: 10/28/2020 14:30   DG Chest Port 1 View  Result Date: 11/09/2020 CLINICAL DATA:  Respiratory failure EXAM: PORTABLE CHEST 1 VIEW COMPARISON:  11/05/2020 FINDINGS: Tracheostomy and nasogastric tube extending into the upper abdomen beyond the margin of the examination are again identified. Lung volumes are small and pulmonary insufflation has diminished since prior examination. Mild left basilar atelectasis or infiltrate. No pneumothorax or pleural effusion. Mild cardiomegaly is stable. Pulmonary vascularity is normal when accounting for poor pulmonary insufflation. No acute bone abnormality. IMPRESSION: Stable support tubes. Progressive pulmonary hypoinflation. Probable developing retrocardiac atelectasis. Stable cardiomegaly Electronically Signed   By: Fidela Salisbury MD   On: 11/09/2020 04:52   DG Chest Port 1 View  Result Date: 11/05/2020 CLINICAL DATA:  Respiratory failure EXAM: PORTABLE CHEST 1 VIEW COMPARISON:  11/02/2020 FINDINGS: Tracheostomy and NG tube are unchanged. Cardiomegaly. Mild vascular congestion. Bilateral airspace opacities again noted, worse at the left base. This is stable since prior study. No visible effusions or pneumothorax. IMPRESSION: Cardiomegaly. Bilateral airspace disease, most pronounced in the left base, stable since prior study. Electronically Signed   By: Rolm Baptise M.D.   On: 11/05/2020 08:03   DG CHEST PORT 1 VIEW  Result Date: 11/02/2020 CLINICAL DATA:  NG tube placement. EXAM:  PORTABLE CHEST 1 VIEW COMPARISON:  Radiograph yesterday. FINDINGS: The tip of the enteric tube is below the diaphragm, not included in the field of view. The side-port is not well visualized but likely below the diaphragm. Stable cardiomegaly. Patchy airspace opacities in both lung bases, unchanged. IMPRESSION: 1. Tip of the enteric tube below the diaphragm, not included in the field of view. 2. Unchanged patchy bibasilar airspace opacities. Electronically Signed   By: Keith Rake M.D.   On: 11/02/2020 21:51   DG CHEST PORT 1 VIEW  Result Date: 11/01/2020 CLINICAL DATA:  Acute on chronic respiratory failure. EXAM: PORTABLE CHEST 1 VIEW COMPARISON:  10/31/2020 FINDINGS: The tracheostomy tube is stable. The NG tube is stable. Much improved left lung aeration when compared to the prior study. The left upper lobe is now aerated. Persistent airspace process in the right lung and at the left lung base. No pleural effusions or pneumothorax. IMPRESSION: 1. Much improved left lung aeration when compared to the prior study. 2. Persistent  right lung and left basilar airspace process. Electronically Signed   By: Marijo Sanes M.D.   On: 11/01/2020 05:55   DG CHEST PORT 1 VIEW  Result Date: 10/31/2020 CLINICAL DATA:  Acute on chronic respiratory EXAM: PORTABLE CHEST 1 VIEW COMPARISON:  Failure four days ago FINDINGS: Enteric tube which reaches the stomach. Tracheostomy tube in place. Interval white out of the left chest with volume loss. Progressive hazy opacification of the right lower chest. Heart size is largely obscured. No visible air leak. IMPRESSION: 1. Interval white out of the left chest, likely plugging and pulmonary collapse. 2. Progressive airspace disease or pleural effusion on the right. Electronically Signed   By: Monte Fantasia M.D.   On: 10/31/2020 04:22   DG Chest Port 1 View  Result Date: 10/27/2020 CLINICAL DATA:  Acute respiratory failure EXAM: PORTABLE CHEST 1 VIEW COMPARISON:   Yesterday FINDINGS: Enteric and tracheostomy tubes remain in good position. Cardiomegaly. Interstitial opacity which could be bronchitic or congestive. There is improved aeration at the right base. Negative for pneumothorax. IMPRESSION: 1. Improved aeration. 2. Bronchitic or congestive interstitial opacity. Electronically Signed   By: Monte Fantasia M.D.   On: 10/27/2020 07:46   DG Chest Port 1 View  Result Date: 10/26/2020 CLINICAL DATA:  Shortness of breath for 2-3 days EXAM: PORTABLE CHEST 1 VIEW COMPARISON:  Three days ago FINDINGS: Progressive hazy appearance of the right mid to lower chest. Dense retrocardiac opacity persists. An enteric tube and tracheostomy tube remain in place. Cardiomegaly and vascular pedicle widening. IMPRESSION: Pleural fluid/pulmonary opacification at the bases which has progressed from 3 days ago. Electronically Signed   By: Monte Fantasia M.D.   On: 10/26/2020 06:02   DG CHEST PORT 1 VIEW  Result Date: 10/23/2020 CLINICAL DATA:  Shortness of breath. EXAM: PORTABLE CHEST 1 VIEW COMPARISON:  October 22, 2020 FINDINGS: The near complete opacification of the left hemithorax seen on the comparison chest x-ray has resolved. Persistent bibasilar opacities are likely atelectasis. No pneumothorax. A tracheostomy tube is in good position. An NG tube terminates in the stomach. No other acute abnormalities. IMPRESSION: 1. Support apparatus as above. 2. Bibasilar atelectasis and a possible small left effusion. Improved aeration of the left lung. Electronically Signed   By: Dorise Bullion III M.D   On: 10/23/2020 14:57   DG Chest Port 1 View  Result Date: 10/22/2020 CLINICAL DATA:  Acute respiratory failure. EXAM: PORTABLE CHEST 1 VIEW COMPARISON:  10/20/2020 FINDINGS: Tracheostomy tube overlies the airway. The cardiac silhouette is enlarged. There is persistent mild central pulmonary vascular congestion without overt edema. Airspace opacities in the left greater than right mid  and lower lungs have slightly improved. Small pleural effusions are questioned. No pneumothorax is identified. IMPRESSION: Slight improvement of bilateral airspace opacities which could reflect pneumonia or atelectasis. Electronically Signed   By: [name] M.D.   On: 10/22/2020 09:16   DG Chest Port 1 View  Result Date: 10/20/2020 CLINICAL DATA:  Questionable sepsis EXAM: PORTABLE CHEST 1 VIEW COMPARISON:  Chest x-ray 06/12/2018 report without images. FINDINGS: Tracheostomy tube terminates approximately 7.5 cm above the carina. The heart size and mediastinal contours are not well visualized. Bilateral hilar vasculature prominence. Aortic arch calcifications. Silhouetting off of the left hemidiaphragm and left cardiac border. Partial silhouetting off the right cardiac border and right hemidiaphragm. Increased interstitial markings. Likely at least trace to small volume left pleural effusion. No pneumothorax. No acute osseous abnormality. IMPRESSION: 1. Left lung opacity and likely at  least trace to small volume left pleural effusion with silhouetting off of the left diaphragm and heart border. Concern for infection/inflammation. 2. Right lower lobe and middle lobe airspace opacity could represent a combination of atelectasis, infection, inflammation. 3. At least mild pulmonary edema. 4. Consider chest x-ray PA and lateral view for further evaluation. Electronically Signed   By: Iven Finn M.D.   On: 10/20/2020 12:26   DG Chest Port 1V same Day  Result Date: 10/22/2020 CLINICAL DATA:  Status post NG tube placement. EXAM: PORTABLE CHEST 1 VIEW COMPARISON:  Single-view of the chest earlier today. FINDINGS: The upper chest is off the margin of the film. NG tube is looped in the upper trachea or esophagus. Visualized left chest is completely whited out on this study. IMPRESSION: NG tube is looped in the upper esophagus or trachea. Complete whiteout of the left chest is new since the study earlier today  could be due to mucous plugging and left lung collapse or rapidly developing pleural effusion and airspace disease. Electronically Signed   By: [name] M.D.   On: 10/22/2020 15:16   DG Abd Portable 1V  Result Date: 10/22/2020 CLINICAL DATA:  Nasogastric tube placement EXAM: PORTABLE ABDOMEN - 1 VIEW COMPARISON:  Portable exam 1613 hours compared to 1523 hours FINDINGS: Tip of nasogastric tube projects over distal gastric antrum, consider withdrawal 5 cm. Bowel gas pattern normal. Opacification of the inferior LEFT hemithorax again seen. IMPRESSION: Tip of nasogastric tube projects over distal gastric antrum. Consider withdrawal of nasogastric tube 5 cm. Electronically Signed   By: [name] M.D.   On: 10/22/2020 16:36   DG Abd Portable 1V  Result Date: 10/22/2020 CLINICAL DATA:  Nasogastric tube placement EXAM: PORTABLE ABDOMEN - 1 VIEW COMPARISON:  None. FINDINGS: Nasogastric tube extends into the gastric cardia where it loops upon itself with the tip at the junction of the mid and distal thirds of the esophagus. There is no bowel dilatation or air-fluid level to suggest bowel obstruction. No free air. There is consolidation left lower lobe. IMPRESSION: Nasogastric tube extends into the stomach where it loops upon itself with the tip at the junction of the mid and distal thirds of the esophagus. No bowel obstruction or free air evident. Consolidation left lower lobe. These results will be called to the ordering clinician or representative by the Radiologist Assistant, and communication documented in the PACS or [employer]. Electronically Signed   By: Lowella Grip III M.D.   On: 10/22/2020 15:34   ECHOCARDIOGRAM COMPLETE  Result Date: 10/21/2020    ECHOCARDIOGRAM REPORT   Patient Name:   Jack Cox Date of Exam: 10/21/2020 Medical Rec #:  [id number]        Height:       68.0 in Accession #:    [id number]       Weight:       325.8 lb Date of Birth:  Apr 17, 1966        BSA:           2.515 m Patient Age:    22 years         BP:           112/61 mmHg Patient Gender: M                HR:           51 bpm. Exam Location:  [facility] Procedure: 2D Echo Indications:    CHF-Acute Diastolic W29.56  History:  Patient has no prior history of Echocardiogram examinations.                 CHF; Risk Factors:Non-Smoker. Acute on chronic respiratory                 failure with hypoxia.  Sonographer:    Leavy Cella RDCS (AE) Referring Phys: [phone] DAVID TAT IMPRESSIONS  1. Left ventricular ejection fraction, by estimation, is 60 to 65%. The left ventricle has normal function. The left ventricle has no regional wall motion abnormalities. There is severe left ventricular hypertrophy. Left ventricular diastolic parameters  are indeterminate. There is the interventricular septum is flattened in systole and diastole, consistent with right ventricular pressure and volume overload.  2. Right ventricular systolic function is mildly reduced. The right ventricular size is mildly enlarged. Tricuspid regurgitation signal is inadequate for assessing PA pressure.  3. Right atrial size was upper normal.  4. The pericardial effusion is posterior to the left ventricle.  5. The mitral valve is grossly normal. Trivial mitral valve regurgitation.  6. The aortic valve is tricuspid. Aortic valve regurgitation is not visualized.  7. The inferior vena cava is normal in size with greater than 50% respiratory variability, suggesting right atrial pressure of 3 mmHg. FINDINGS  Left Ventricle: Left ventricular ejection fraction, by estimation, is 60 to 65%. The left ventricle has normal function. The left ventricle has no regional wall motion abnormalities. The left ventricular internal cavity size was normal in size. There is  severe left ventricular hypertrophy. The interventricular septum is flattened in systole and diastole, consistent with right ventricular pressure and volume overload. Left ventricular diastolic  parameters are indeterminate. Right Ventricle: The right ventricular size is mildly enlarged. No increase in right ventricular wall thickness. Right ventricular systolic function is mildly reduced. Tricuspid regurgitation signal is inadequate for assessing PA pressure. Left Atrium: Left atrial size was normal in size. Right Atrium: Right atrial size was upper normal. Pericardium: Trivial pericardial effusion is present. The pericardial effusion is posterior to the left ventricle. Mitral Valve: The mitral valve is grossly normal. Trivial mitral valve regurgitation. Tricuspid Valve: The tricuspid valve is grossly normal. Tricuspid valve regurgitation is mild. Aortic Valve: The aortic valve is tricuspid. There is moderate aortic valve annular calcification. Aortic valve regurgitation is not visualized. Pulmonic Valve: The pulmonic valve was grossly normal. Pulmonic valve regurgitation is trivial. Aorta: The aortic root is normal in size and structure. Venous: The inferior vena cava is normal in size with greater than 50% respiratory variability, suggesting right atrial pressure of 3 mmHg. IAS/Shunts: No atrial level shunt detected by color flow Doppler.  LEFT VENTRICLE PLAX 2D LVIDd:         4.66 cm  Diastology LVIDs:         2.03 cm  LV e' medial:    5.87 cm/s LV PW:         1.76 cm  LV E/e' medial:  14.0 LV IVS:        1.73 cm  LV e' lateral:   8.49 cm/s LVOT diam:     1.90 cm  LV E/e' lateral: 9.7 LVOT Area:     2.84 cm  RIGHT VENTRICLE RV S prime:     19.40 cm/s TAPSE (M-mode): 1.9 cm LEFT ATRIUM             Index       RIGHT ATRIUM           Index LA diam:  3.50 cm 1.39 cm/m  RA Area:     24.60 cm LA Vol (A2C):   43.9 ml 17.46 ml/m RA Volume:   82.30 ml  32.73 ml/m LA Vol (A4C):   43.8 ml 17.42 ml/m LA Biplane Vol: 44.4 ml 17.66 ml/m   AORTA Ao Root diam: 3.10 cm MITRAL VALVE MV Area (PHT): 2.64 cm    SHUNTS MV Decel Time: 287 msec    Systemic Diam: 1.90 cm MV E velocity: 82.30 cm/s MV A velocity:  59.10 cm/s MV E/A ratio:  1.39 Rozann Lesches MD Electronically signed by Rozann Lesches MD Signature Date/Time: 10/21/2020/4:33:43 PM    Final     Labs:  CBC: Recent Labs    11/07/20 0453 11/08/20 0246 11/09/20 0422 11/10/20 0458  WBC 6.1 4.9 4.8 4.9  HGB 11.9* 12.0* 11.4* 11.2*  HCT 40.1 40.1 37.9* 38.2*  PLT 277 255 305 304    COAGS: Recent Labs    10/20/20 1259 10/27/20 1058 11/09/20 1356  INR 1.5*  --  1.2  APTT 36 33  --     BMP: Recent Labs    11/07/20 0453 11/08/20 0246 11/09/20 0422 11/10/20 0458  NA 137 137 137 138  K 4.4 4.1 4.0 3.8  CL 101 101 100 100  CO2 28 28 28 29   GLUCOSE 127* 127* 112* 95  BUN 93* 91* 85* 82*  CALCIUM 10.2 10.2 10.7* 10.6*  CREATININE 1.98* 1.84* 1.72* 1.78*  GFRNONAA 39* 43* 47* 45*    LIVER FUNCTION TESTS: Recent Labs    10/20/20 1217 10/22/20 0512 10/24/20 0402 10/25/20 0618 10/26/20 0436 11/07/20 0453 11/08/20 0246 11/09/20 0422 11/10/20 0458  BILITOT 0.4 1.0 0.6 0.8  --   --   --   --   --   AST 33 22 29 27   --   --   --   --   --   ALT 30 20 16 16   --   --   --   --   --   ALKPHOS 114 84 71 75  --   --   --   --   --   PROT 7.9 6.2* 6.1* 6.9  --   --   --   --   --   ALBUMIN 3.2* 2.3* 2.6* 3.0*   < > 2.5* 2.4* 2.5* 2.4*   < > = values in this interval not displayed.    TUMOR MARKERS: No results for input(s): AFPTM, CEA, CA199, CHROMGRNA in the last 8760 hours.  Assessment and Plan:  Hypoxic Encephalopathy-- resolving Dysphagia-- malnutrition Long term care Scheduled for percutaneous gastric tube placement Risks and benefits image guided gastrostomy tube placement was discussed with the patient and wife via phone including, but not limited to the need for a barium enema during the procedure, bleeding, infection, peritonitis and/or damage to adjacent structures.  All questions were answered, [name] is agreeable to proceed. Consent signed and in chart.   Thank you for this interesting consult.   I greatly enjoyed meeting Emileo Semel and look forward to participating in their care.  A copy of this report was sent to the requesting provider on this date.  Electronically Signed: Lavonia Drafts, PA-C 11/10/2020, 8:15 AM   I spent a total of 40 Minutes    in face to face in clinical consultation, greater than 50% of which was counseling/coordinating care for perc Gastric tube placement

## 2020-11-10 NOTE — Progress Notes (Signed)
Palliative: Thorough chart review completed.  Jack Cox and his healthcare agent, his former spouse, Jack Cox, are agreeable to PEG tube placement.  This is scheduled through [facility], and is expected to be placed in the next few days.  Transition of care team is working diligently for placement with [facility].  Goals are set.  Palliative medicine team continues to shadow for declines.    Plan: PEG tube placement, LTAC with [facility] if accepted.  Continue full scope/full code  No charge Quinn Axe, NP Palliative medicine team Team phone [phone] Greater than 50% of this time was spent counseling and coordinating care related to the above assessment and plan.

## 2020-11-10 NOTE — Progress Notes (Signed)
ANTICOAGULATION CONSULT NOTE  Pharmacy Consult:  Heparin Indication: atrial fibrillation  No Known Allergies  Patient Measurements: Height: 5\' 7"  (170.2 cm) Weight: (!) 143.1 kg (315 lb 7.7 oz) IBW/kg (Calculated) : 66.1 HEPARIN DW (KG): 103.8  Vital Signs: Temp: 98.7 F (37.1 C) (01/05 0719) Temp Source: Oral (01/05 0719) BP: 143/58 (01/05 0400) Pulse Rate: 65 (01/05 0400)  Labs: Recent Labs    11/08/20 0246 11/09/20 0422 11/09/20 1356 11/10/20 0458  HGB 12.0* 11.4*  --  11.2*  HCT 40.1 37.9*  --  38.2*  PLT 255 305  --  304  LABPROT  --   --  14.5  --   INR  --   --  1.2  --   HEPARINUNFRC 0.34 0.21* 0.32 0.43  CREATININE 1.84* 1.72*  --  1.78*    Estimated Creatinine Clearance: 65 mL/min (A) (by C-G formula based on SCr of 1.78 mg/dL (H)).  Assessment: Pharmacy consulted to dose heparin infusion for this 55 yo male with atrial fibrillation.  Patient was  taking apixaban PTA and was on treatment-dose Lovenox from 12-17 to 12-20. Heparin is being started due to patient's unstable renal function.   HL 0.43: Therapeutic  Goal of Therapy:  Heparin level 0.3-0.7 units/ml Monitor platelets by anticoagulation protocol: Yes    Plan:  Continue  heparin infusion at  2550 units/hr Daily heparin level and CBC Monitor for signs and symptoms of bleeding F/U transition to oral anti-coagulant   Margot Ables, PharmD Clinical Pharmacist 11/10/2020 7:44 AM

## 2020-11-10 NOTE — Sedation Documentation (Signed)
Transferred back to [facility] by [facility],. Awake and alert. In no distress

## 2020-11-10 NOTE — Procedures (Signed)
Interventional Radiology Procedure Note  Procedure: Placement of percutaneous 20F pull-through gastrostomy tube. Complications: None Recommendations: - NPO except for sips and chips remainder of today and overnight - Maintain G-tube to LWS until tomorrow morning  - May advance diet as tolerated and begin using tube tomorrow morning  Signed,   Laasia Arcos S. Darlen Gledhill, DO   

## 2020-11-11 DIAGNOSIS — J9621 Acute and chronic respiratory failure with hypoxia: Secondary | ICD-10-CM | POA: Diagnosis not present

## 2020-11-11 DIAGNOSIS — Z930 Tracheostomy status: Secondary | ICD-10-CM | POA: Diagnosis not present

## 2020-11-11 DIAGNOSIS — J9622 Acute and chronic respiratory failure with hypercapnia: Secondary | ICD-10-CM | POA: Diagnosis not present

## 2020-11-11 DIAGNOSIS — J9602 Acute respiratory failure with hypercapnia: Secondary | ICD-10-CM

## 2020-11-11 DIAGNOSIS — T17500A Unspecified foreign body in bronchus causing asphyxiation, initial encounter: Secondary | ICD-10-CM

## 2020-11-11 DIAGNOSIS — I509 Heart failure, unspecified: Secondary | ICD-10-CM

## 2020-11-11 DIAGNOSIS — J9601 Acute respiratory failure with hypoxia: Secondary | ICD-10-CM

## 2020-11-11 LAB — GLUCOSE, CAPILLARY
Glucose-Capillary: 82 mg/dL (ref 70–99)
Glucose-Capillary: 80 mg/dL (ref 70–99)
Glucose-Capillary: 106 mg/dL — ABNORMAL HIGH (ref 70–99)
Glucose-Capillary: 98 mg/dL (ref 70–99)
Glucose-Capillary: 90 mg/dL (ref 70–99)

## 2020-11-11 LAB — RENAL FUNCTION PANEL
Albumin: 2.6 g/dL — ABNORMAL LOW (ref 3.5–5.0)
Anion gap: 10 (ref 5–15)
BUN: 80 mg/dL — ABNORMAL HIGH (ref 6–20)
CO2: 28 mmol/L (ref 22–32)
Calcium: 10.8 mg/dL — ABNORMAL HIGH (ref 8.9–10.3)
Chloride: 103 mmol/L (ref 98–111)
Creatinine, Ser: 1.99 mg/dL — ABNORMAL HIGH (ref 0.61–1.24)
GFR, Estimated: 39 mL/min — ABNORMAL LOW (ref >60)
Glucose, Bld: 95 mg/dL (ref 70–99)
Phosphorus: 5.3 mg/dL — ABNORMAL HIGH (ref 2.5–4.6)
Potassium: 4 mmol/L (ref 3.5–5.1)
Sodium: 141 mmol/L (ref 135–145)

## 2020-11-11 LAB — HEPARIN LEVEL (UNFRACTIONATED): Heparin Unfractionated: 0.39 IU/mL (ref 0.30–0.70)

## 2020-11-11 LAB — CBC
HCT: 40.2 % (ref 39.0–52.0)
Hemoglobin: 11.7 g/dL — ABNORMAL LOW (ref 13.0–17.0)
MCH: 29.2 pg (ref 26.0–34.0)
MCHC: 29.1 g/dL — ABNORMAL LOW (ref 30.0–36.0)
MCV: 100.2 fL — ABNORMAL HIGH (ref 80.0–100.0)
Platelets: 286 10*3/uL (ref 150–400)
RBC: 4.01 MIL/uL — ABNORMAL LOW (ref 4.22–5.81)
RDW: 14.3 % (ref 11.5–15.5)
WBC: 5.3 10*3/uL (ref 4.0–10.5)
nRBC: 0 % (ref 0.0–0.2)

## 2020-11-11 MED ORDER — APIXABAN 5 MG PO TABS
5.0000 mg | ORAL_TABLET | Freq: Two times a day (BID) | ORAL | Status: DC
  Administered 2020-11-11: 5 mg via ORAL
  Filled 2020-11-11: qty 1

## 2020-11-11 MED ORDER — APIXABAN 5 MG PO TABS
5.0000 mg | ORAL_TABLET | Freq: Two times a day (BID) | ORAL | Status: DC
  Administered 2020-11-11 – 2020-11-12 (×2): 5 mg
  Filled 2020-11-11 (×2): qty 1

## 2020-11-11 NOTE — Progress Notes (Signed)
NAME:  Jack Cox, MRN:  [id number], DOB:  [birth year], LOS: 67 ADMISSION DATE:  10/20/2020, CONSULTATION DATE:  10/20/20  REFERRING MD:  Tat, Triad  CHIEF COMPLAINT:  Resp distress   Brief History   55 yo male never smoker with hx of OSA s/p tracheostomy 2001 and previously followed by Dr. Elsworth Soho presented on 10/20/20 with fever, cough with purulent sputum.  Found to have severe hypoxia and hypercapnia and required ventilatory support in ER.  Past Medical History  OSA, Morbid obesity with BMI 48.10, CKD, CHF, HTN, Gout, [facility] Events   12/17 changed to PCV prn  12/18 CXR improved  12/20 changed from propofol to Precedex 12/21 fever , bronch 12/30 added back mucomyst for plugs 01/03 tolerated 4 hrs of TC >> back on vent due to respiratory secretions and hypoxia 01/05 PEG placed  Consults:  Palliative care  Nephrology  Procedures:  New cuffed trach 12/15 >>  PEG 1/06 >>   Significant Diagnostic Tests:  Echo 12/16 >> EF 60 to 65%, severe LVH, mild RV systolic dysfx Bronchoscopy 12/21 >> copious secretions  Micro Data:  COVID/Flu 12/15 >> negative MRSA  PCR  12/15 >> negative Blood 12/15 >> negative Sputum 12/16 >> oral flora Blood 12/19 >> negative Sputum 12/21 >> oral flora Sputum 12/31 >> oral flora  Antimicrobials:  zmax  12/15  only Rocephin 12/15  only Cefepime12/21 >> 12/27   Interim history/subjective:  Had PEG yesterday.  Transitioned to TC this AM.  Decreased respiratory secretions.  Objective   Blood pressure (!) 171/84, pulse 73, temperature 98.3 F (36.8 C), temperature source Oral, resp. rate (!) 24, height 5\' 7"  (1.702 m), weight (!) 143.1 kg, SpO2 93 %.    Vent Mode: PCV FiO2 (%):  [40 %-60 %] 40 % Set Rate:  [14 bmp-20 bmp] 20 bmp PEEP:  [5 cmH20] 5 cmH20 Plateau Pressure:  [16 cmH20-17 cmH20] 16 cmH20   Intake/Output Summary (Last 24 hours) at 11/11/2020 1348 Last data filed at 11/11/2020 0900 Gross per 24 hour  Intake  100 ml  Output 650 ml  Net -550 ml   Filed Weights   11/05/20 0500 11/09/20 0448 11/10/20 0456  Weight: (!) 139.3 kg (!) 144.5 kg (!) 143.1 kg    Examination:  General - sleepy Eyes - pupils reactive ENT - trach site clean Cardiac - regular rate/rhythm, no murmur Chest - better air movement, no wheeze Abdomen - soft, non tender, + bowel sounds, PEG in place Extremities - 1+ edema Skin - no rashes Neuro - follows commands appropriately  Resolved Hospital Problem list   Tracheobronchitis/HCAP  Assessment & Plan:   Acute on chronic hypoxic/hypercapnic respiratory failure. OSA/OHS s/p tracheostomy. - TC as tolerate with vent support at night - goal SpO2 90 to 95% - consult speech to assess for PM valve - f/u CXR intermittently  Recurrent mucus plugging. - bronchial hygiene - mobilize as able - d/c mucomyst - continue albuterol  Sedation needs. - precedex with prn fentanyl/ativan for RASS goal 0 to -1  Acute on chronic diastolic CHF. Atrial flutter. CKD 4. - per primary team  Best practice:   Diet: TFs DVT prophylaxis: IV heparin  GI prophylaxis: Protonix Mobility: start to mobilize to chair as tol  Disposition: ICU; LTAC when bed available  Labs    CMP Latest Ref Rng & Units 11/11/2020 11/10/2020 11/09/2020  Glucose 70 - 99 mg/dL 95 95 112(H)  BUN 6 - 20 mg/dL 80(H) 82(H) 85(H)  Creatinine  0.61 - 1.24 mg/dL 1.99(H) 1.78(H) 1.72(H)  Sodium 135 - 145 mmol/L 141 138 137  Potassium 3.5 - 5.1 mmol/L 4.0 3.8 4.0  Chloride 98 - 111 mmol/L 103 100 100  CO2 22 - 32 mmol/L 28 29 28   Calcium 8.9 - 10.3 mg/dL 10.8(H) 10.6(H) 10.7(H)  Total Protein 6.5 - 8.1 g/dL - - -  Total Bilirubin 0.3 - 1.2 mg/dL - - -  Alkaline Phos 38 - 126 U/L - - -  AST 15 - 41 U/L - - -  ALT 0 - 44 U/L - - -    CBC Latest Ref Rng & Units 11/11/2020 11/10/2020 11/09/2020  WBC 4.0 - 10.5 K/uL 5.3 4.9 4.8  Hemoglobin 13.0 - 17.0 g/dL 11.7(L) 11.2(L) 11.4(L)  Hematocrit 39.0 - 52.0 % 40.2 38.2(L)  37.9(L)  Platelets 150 - 400 K/uL 286 304 305    ABG    Component Value Date/Time   PHART 7.242 (L) 10/20/2020 2355   PCO2ART 79.3 (HH) 10/20/2020 2355   PO2ART 80.0 (L) 10/20/2020 2355   HCO3 27.6 10/20/2020 2355   O2SAT 93.6 10/20/2020 2355    CBG (last 3)  Recent Labs    11/11/20 0430 11/11/20 0723 11/11/20 1118  GLUCAP 82 80 106*    Signature:  Chesley Mires, MD [place] Pager - [phone] 11/11/2020, 1:48 PM

## 2020-11-11 NOTE — Evaluation (Signed)
Passy-Muir Speaking Valve - Evaluation Patient Details  Name: Jack Cox MRN: [id number] Date of Birth: May 01, 1966  Today's Date: 11/11/2020 Time: 3536-1443 SLP Time Calculation (min) (ACUTE ONLY): 32 min  Past Medical History:  Past Medical History:  Diagnosis Date  . CKD (chronic kidney disease)   . Congestive heart failure (CHF) ([place])   . Essential hypertension   . Fatigue   . Gout   . Hypercholesteremia   . Insomnia   . Proteinuria   . Renal insufficiency   . Shortness of breath   . 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 IR GASTROSTOMY TUBE MOD SED  11/10/2020   HPI:  55 yo male never smoker with hx of OSA s/p tracheostomy 2001 and previously followed by Dr. Elsworth Soho presented on 10/20/20 with fever, cough with purulent sputum.  Found to have severe hypoxia and hypercapnia and required ventilatory support in ER. Pt's trach changed to new Shiley cuffed #4 on 12/15, underwent PEG 11/10/20. He has a PMSV from home. MD requested PMSV evaluation and swallow evaluation. Pt was eating regular foods and liquids prior to admission.   Assessment / Plan / Recommendation Clinical Impression  Pt has had track for ~20 years due to sleep apnea. He has a PMSV from home in room, but has not worn during this admission due to previous vent support. MD requested trial of PMSV. Pt tolerated cuff deflation well, able to redirect air through upper airway with cuff deflated and PMSV placed, no change in sats. Pt had a few sips of water and ice chips without signs of reduced airway protection. Recommend that Pt wear PMSV with cuff deflated during waking hours and ok for small sips of water and ice chips with MBSS to be completed tomorrow. Above d/w RN and MD, RT, Pt in agreement with plan of care.  SLP Visit Diagnosis: Aphonia (R49.1);Dysphagia, unspecified (R13.10)    SLP Assessment  Patient needs continued Speech Lanaguage  Pathology Services    Follow Up Recommendations  LTACH    Frequency and Duration min 2x/week  1 week    PMSV Trial PMSV was placed for: 15 minutes and left on Able to redirect subglottic air through upper airway: Yes Able to Attain Phonation: Yes Voice Quality: Normal (raspy) Able to Expectorate Secretions: No attempts Level of Secretion Expectoration with PMSV: Tracheal (one cough and valve popped off) Breath Support for Phonation: Adequate Intelligibility: Intelligible Respirations During Trial: 23 SpO2 During Trial: 93 % Pulse During Trial: 72 Behavior: Alert;Cooperative;Expresses self well;Good eye contact   Tracheostomy Tube  Additional Tracheostomy Tube Assessment Secretion Description: clear, minimal Level of Secretion Expectoration: Tracheal    Vent Dependency  FiO2 (%): 40 %    Cuff Deflation Trial  GO   Thank you,  Genene Churn, CCC-SLP [phone]  Tolerated Cuff Deflation: Yes Length of Time for Cuff Deflation Trial: 5 Behavior: Alert;Cooperative;Smiling        Jack Cox 11/11/2020, 2:54 PM

## 2020-11-11 NOTE — TOC Progression Note (Signed)
Transition of Care [facility]) - Progression Note    Patient Details  Name: Jack Cox MRN: [id number] Date of Birth: [birth year]  Transition of Care [facility] CM/SW Contact  Shade Flood, LCSW Phone Number: 11/11/2020, 1:58 PM  Clinical Narrative:     TOC following. Met with pt's wife today to assist with questions related to [place] benefits.   Pt received PEG tube yesterday and feedings are started. Pt is off of Heparin drip. Awaiting insurance decision on LTAC transfer. Pt's wife aware that pt will transfer to [facility] if insurance approves.   TOC will follow.  Expected Discharge Plan: Long Term Acute Care (LTAC) Barriers to Discharge: Insurance Authorization  Expected Discharge Plan and Services Expected Discharge Plan: Long Term Acute Care (LTAC) In-house Referral: Clinical Social Work Discharge Planning Services: NA   Living arrangements for the past 2 months: Single Family Home                 DME Arranged: N/A DME Agency: NA       HH Arranged: NA HH Agency: NA         Social Determinants of Health (SDOH) Interventions    Readmission Risk Interventions No flowsheet data found.

## 2020-11-11 NOTE — Progress Notes (Signed)
Rt placed the pt on 60% ATC at 0846. Pt is tolerating the ATC

## 2020-11-11 NOTE — Progress Notes (Addendum)
ANTICOAGULATION CONSULT NOTE  Pharmacy Consult:  Heparin>> apixaban Indication: atrial fibrillation  No Known Allergies  Patient Measurements: Height: 5\' 7"  (170.2 cm) Weight: (!) 143.1 kg (315 lb 7.7 oz) IBW/kg (Calculated) : 66.1 HEPARIN DW (KG): 103.8  Vital Signs: Temp: 98.7 F (37.1 C) (01/06 0723) Temp Source: Oral (01/06 0723) BP: 152/77 (01/06 0200) Pulse Rate: 71 (01/06 0457)  Labs: Recent Labs    11/09/20 0422 11/09/20 1356 11/10/20 0458 11/11/20 0536  HGB 11.4*  --  11.2* 11.7*  HCT 37.9*  --  38.2* 40.2  PLT 305  --  304 286  LABPROT  --  14.5  --   --   INR  --  1.2  --   --   HEPARINUNFRC 0.21* 0.32 0.43 0.39  CREATININE 1.72*  --  1.78* 1.99*    Estimated Creatinine Clearance: 58.2 mL/min (A) (by C-G formula based on SCr of 1.99 mg/dL (H)).  Assessment: Pharmacy consulted to dose heparin infusion for this 55 yo male with atrial fibrillation.  Patient was  taking apixaban PTA and was on treatment-dose Lovenox from 12-17 to 12-20.   Now being transitioned from heparin back to home Eliquis.   Goal of Therapy:   Monitor platelets by anticoagulation protocol: Yes    Plan:  Stop heparin infusion. Start apixaban 5 mg twice daily Monitor for signs and symptoms of bleeding    Margot Ables, PharmD Clinical Pharmacist 11/11/2020 7:45 AM

## 2020-11-11 NOTE — Progress Notes (Signed)
Physical Therapy Treatment Patient Details Name: Jack Cox MRN: [id number] DOB: 12-Feb-1966 Today's Date: 11/11/2020    History of Present Illness Jack Cox is a 55 y.o. male with medical history of diastolic CHF, atrial flutter, obstructive sleep apnea, hypertension, right bundle branch block presenting with shortness of breath for the last 2 to 3 days.  Unfortunately, the patient is encephalopathic at the time of my evaluation; therefore, history is limited.  Apparently, the patient went to see his nephrologist earlier on 10/20/2020.  He was noted to have oxygen saturation in the 60s.  As result, he was sent to the emergency department for further evaluation.  Initially, the patient was awake and conversant with oxygen saturation in the low 70s.  He was placed on a nonrebreather after which the patient became obtunded.  VBG showed pH 7.1 66/97/106 on 80%.  Subsequently, the patient was placed on the ventilator with PRVC, PEEP of 5, FiO2 70%.  After approximately 10 to 15 minutes on the ventilator, the patient remained somnolent, but awakened and was able to tell me his name.  He did state that he has not been taking his torsemide as directed.  In addition, the patient has been sleeping in a recliner for an unclear duration of time secondary to shortness of breath.  At baseline, the patient has a size 4 noncuffed tracheostomy.  He is on supplemental oxygen 2.5 L during the day and 4 L at nighttime.  There is no reports of nausea, vomiting, diarrhea, chest pain, abdominal pain.  In the emergency department, the patient was afebrile and hemodynamically stable with oxygen saturation 93-94% on the ventilator with FiO2 70%.  Chest x-ray showed left-sided pleural effusion with increased interstitial markings.  BNP 790.  PCT 0.12.  BMP showed a serum creatinine 2.43.  LFTs were unremarkable.  WBC 8.1, hemoglobin 14.5, platelets 253,000.  The patient was given ceftriaxone and azithromycin.  He was also  given furosemide 80 mg IV x1.  COVID-19 RT-PCR negative    PT Comments    Patient agreeable for therapy - his x-wife present at bedside.  Patient has difficulty scooting to EOB due to BUE weakness, demonstrates increased BLE strength for completing sit to stands, nearly able to lock knee during 2 trials of sit to stands at bedside and tolerated standing for up to 20-30 seconds before having to sit.  Patient demonstrates good return for using RUE and BLE to help reposition self in bed with bed in head down position.  Patient will benefit from continued physical therapy in hospital and recommended venue below to increase strength, balance, endurance for safe ADLs and gait.    Follow Up Recommendations  SNF     Equipment Recommendations  None recommended by PT    Recommendations for Other Services       Precautions / Restrictions Precautions Precautions: Fall Restrictions Weight Bearing Restrictions: No    Mobility  Bed Mobility   Bed Mobility: Sit to Supine;Supine to Sit     Supine to sit: Min assist;Mod assist Sit to supine: Mod assist   General bed mobility comments: has most diffiuclty scooting to EOB secondary to BUE weakness  Transfers Overall transfer level: Needs assistance Equipment used: Rolling walker (2 wheeled) Transfers: Sit to/from Stand Sit to Stand: Mod assist;Max assist         General transfer comment: able to partially stand x 2 attempts, nearly able to lock knees  Ambulation/Gait  Stairs             Wheelchair Mobility    Modified Rankin (Stroke Patients Only)       Balance Overall balance assessment: Needs assistance Sitting-balance support: Feet supported;No upper extremity supported Sitting balance-Leahy Scale: Fair Sitting balance - Comments: fair/good seated at EOB   Standing balance support: During functional activity;Bilateral upper extremity supported Standing balance-Leahy Scale: Poor Standing  balance comment: using RW                            Cognition Arousal/Alertness: Awake/alert Behavior During Therapy: WFL for tasks assessed/performed Overall Cognitive Status: Within Functional Limits for tasks assessed                                        Exercises General Exercises - Lower Extremity Long Arc Quad: Seated;AROM;Strengthening;Both;10 reps Hip Flexion/Marching: Seated;AROM;Strengthening;Both;10 reps Toe Raises: Seated;AROM;Strengthening;Both;20 reps Heel Raises: Seated;AROM;Strengthening;Both;20 reps    General Comments        Pertinent Vitals/Pain Pain Assessment: Faces Faces Pain Scale: Hurts little more Pain Location: right shoulder with end range flexion/abduction, hands, discomfort in throat at sight of trach Pain Descriptors / Indicators: Guarding;Sore;Grimacing Pain Intervention(s): Limited activity within patient's tolerance;Monitored during session;Repositioned    Home Living                      Prior Function            PT Goals (current goals can now be found in the care plan section) Acute Rehab PT Goals Patient Stated Goal: return home after rehab PT Goal Formulation: With patient Time For Goal Achievement: 11/17/20 Potential to Achieve Goals: Good Progress towards PT goals: Progressing toward goals    Frequency    Min 3X/week      PT Plan      Co-evaluation              AM-PAC PT "6 Clicks" Mobility   Outcome Measure  Help needed turning from your back to your side while in a flat bed without using bedrails?: A Little Help needed moving from lying on your back to sitting on the side of a flat bed without using bedrails?: A Lot Help needed moving to and from a bed to a chair (including a wheelchair)?: Total Help needed standing up from a chair using your arms (e.g., wheelchair or bedside chair)?: A Lot Help needed to walk in hospital room?: Total Help needed climbing 3-5 steps with  a railing? : Total 6 Click Score: 10    End of Session Equipment Utilized During Treatment: Oxygen Activity Tolerance: Patient tolerated treatment well;Patient limited by fatigue Patient left: in bed;with call bell/phone within reach Nurse Communication: Mobility status PT Visit Diagnosis: Unsteadiness on feet (R26.81);Other abnormalities of gait and mobility (R26.89);Muscle weakness (generalized) (M62.81)     Time: 8416-6063 PT Time Calculation (min) (ACUTE ONLY): 28 min  Charges:  $Therapeutic Exercise: 8-22 mins $Therapeutic Activity: 8-22 mins                     2:11 PM, 11/11/20 Lonell Grandchild, MPT Physical Therapist with [facility] 336 [phone] office [phone] mobile phone

## 2020-11-11 NOTE — Progress Notes (Signed)
PROGRESS NOTE  Jack Cox WUX:[id number] DOB: Oct 14, 1966 DOA: 10/20/2020 PCP: [name], MD   Brief History: 55 y.o.malewith medical history ofdiastolic CHF, atrial flutter, obstructive sleep apnea, hypertension, right bundle branch block presenting with shortness of breath for the last 2 to 3 days. Unfortunately, the patient is encephalopathic at the time of my evaluation; therefore, history is limited. Apparently, the patient went to see his nephrologist earlier on 10/20/2020. He was noted to have oxygen saturation in the 60s. As result, he was sent to the emergency department for further evaluation. Initially, the patient was awake and conversant with oxygen saturation in the low 70s. He was placed on a nonrebreather after which the patient became obtunded. VBG showed pH 7.1 66/97/106 on 80%. Subsequently, the patient was placed on the ventilator with PRVC,PEEP of 5, FiO2 70%. After approximately 10 to 15 minutes on the ventilator, the patient remained somnolent, but awakened and was able to tell me his name. He did state that he has not been taking his torsemide as directed. In addition, the patient has been sleeping in a recliner for an unclear duration of time secondary to shortness of breath. At baseline, the patient has a size 4 noncuffed tracheostomy. He is on supplemental oxygen 2.5 L during the day and 4 L at nighttime.  There is no reports of nausea, vomiting, diarrhea, chest pain, abdominal pain.  In the emergency department, the patient was afebrile and hemodynamically stable with oxygen saturation 93-94% on the ventilator with FiO2 70%. Chest x-ray showed left-sided pleural effusion with increased interstitial markings. BNP 790. PCT 0.12. BMP showed a serum creatinine 2.43. LFTs were unremarkable. WBC 8.1, hemoglobin 14.5, platelets 253,000. The patient was given ceftriaxone and azithromycin. He was also given furosemide 80 mg IV x1. COVID-19  RT-PCR negative.  He was continued on IV lasix 80 mg bid with uptrending of his serum creatinine.  Renal was consulted to assist.   Assessment/Plan: Acute on chronic respiratory failure with hypoxia and hypercarbia -Secondary to CHF in the setting of OSA -having mucus plugging--needs aggressive suctioning -12/17 and 12/26 CXR--white out of left lung likely mucus plugging -continue mucomyst and albuterol -10/26/20-bronchoscopy--Thick inspissated mucus in proximal airways & BL lower lobes, removed with suctioning - hypertonic saline nebulizer treatments started by pulmonology 12/22 Vent Mode: PCV FiO2 (%):  [40 %-60 %] 40 % Set Rate:  [20 bmp] 20 bmp PEEP:  [5 cmH20] 5 cmH20 Plateau Pressure:  [16 cmH20-17 cmH20] 16 cmH20  Acute on chronic diastolic CHF/Cor Pulmonale -06/25/2019 echo EF 55%, trivial TR -12/16/21echo--EF 60-65%, no WMA, RV pressure overload, mild TR -he has been diuresing well with no diuretic support -Following BMP -Accurate I's and O's--NEG 13L -Daily weights I/O last 3 completed shifts: In: 1717.4 [I.V.:552; NG/GT:1165.3] Out: 2725 [Urine:2725] Total I/O In: 100 [Other:100] Out: 850 [Urine:850]  Atrial flutter - controlled -apixaban restarted 1/6 and he is off IV heparin infusion -rate controlledbut had an episode 3-4 hours 12/18-12/19 evening -controlled on low dose metoprolol 25 mg BID per tube   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    Fever - RESOLVED -blood cultures x 2--neg to date -UA and urine culture--no significant pyuria -12/16 trach aspirate-->flora -10/26/20-->BAL sent for culture -completed course of cefepime  Essential hypertension -metoprolol low dose with  holding parameters -BPs elevated, add PRN coverage as needed  OSA -The patient has a tracheostomy  placed in 2001 -He has a size 4 cuffless trach  Right bundle branch block -This has been chronic with review of the medical records  Morbid obesity -BMI 52.94 -lifestyle modification  GOC -palliative following -currently full scope of care  Status is: Inpatient  Remains inpatient appropriate because:IV treatments appropriate due to intensity of illness or inability to take PO  Dispo: The patient is from:Home Anticipated d/c is QR:FXJO Anticipated d/c date is: 1 days Patient currently is medically stable to d/c to LTAC.    Family Communication:updates at bedside  Consultants:pulm, renal  Code Status: FULL  DVT Prophylaxis: IV heparin infusion for full anticoagulation   Procedures: As Listed in Progress Note Above  Antibiotics: Cefepime 12/21>>12/27  Subjective: Patient c/o dry mouth and wants water  Objective: Vitals:   11/11/20 1200 11/11/20 1300 11/11/20 1459 11/11/20 1500  BP: (!) 182/94 (!) 171/84  (!) 182/94  Pulse: 73 73  67  Resp: (!) 21 (!) 24  17  Temp:      TempSrc:      SpO2: 96% 93% 95% 95%  Weight:      Height:        Intake/Output Summary (Last 24 hours) at 11/11/2020 1517 Last data filed at 11/11/2020 1434 Gross per 24 hour  Intake 100 ml  Output 1500 ml  Net -1400 ml   Weight change:  Exam:  General - He is awake and alert, no apparent distress.   Neck - trach collar in place.  Lungs: BBS clear to ausculation. No increased WOB.  CV - normal s1,s2 sounds. No MRG.  Abd - obese, soft, no masses palpated.   Ext - trace pretibial edema BLEs.  Neuro - nonfocal exam.   Data Reviewed: I have personally reviewed following labs and imaging studies  Basic Metabolic Panel: Recent Labs  Lab 11/07/20 0453 11/08/20 0246 11/09/20 0422 11/10/20 0458 11/11/20 0536  NA 137 137 137 138 141  K 4.4 4.1 4.0 3.8 4.0  CL 101 101 100 100 103  CO2 _0 GLUCOSE 127* 127* 112*  95 95  BUN 93* 91* 85* 82* 80*  CREATININE 1.98* 1.84* 1.72* 1.78* 1.99*  CALCIUM 10.2 10.2 10.7* 10.6* 10.8*  PHOS 5.3* 4.6 4.5 5.3* 5.3*   Liver Function Tests: Recent Labs  Lab 11/07/20 0453 11/08/20 0246 11/09/20 0422 11/10/20 0458 11/11/20 0536  ALBUMIN 2.5* 2.4* 2.5* 2.4* 2.6*   No results for input(s): LIPASE, AMYLASE in the last 168 hours. No results for input(s): AMMONIA in the last 168 hours. Coagulation Profile: Recent Labs  Lab 11/09/20 1356  INR 1.2   CBC: Recent Labs  Lab 11/07/20 0453 11/08/20 0246 11/09/20 0422 11/10/20 0458 11/11/20 0536  WBC 6.1 4.9 4.8 4.9 5.3  HGB 11.9* 12.0* 11.4* 11.2* 11.7*  HCT 40.1 40.1 37.9* 38.2* 40.2  MCV 99.3 98.3 97.9 99.2 100.2*  PLT 277 255 305 304 286   Cardiac Enzymes: No results for input(s): CKTOTAL, CKMB, CKMBINDEX, TROPONINI in the last 168 hours. BNP: Invalid input(s): POCBNP CBG: Recent Labs  Lab 11/10/20 1605 11/10/20 2058 11/11/20 0430 11/11/20 0723 11/11/20 1118  GLUCAP 87 80 82 80 106*   HbA1C: No results for input(s): HGBA1C in the last 72 hours. Urine analysis:    Component Value Date/Time   COLORURINE YELLOW 10/28/2020 0429   APPEARANCEUR HAZY (A) 10/28/2020 0429   LABSPEC 1.014 10/28/2020  [place] 5.0 10/28/2020 0429   GLUCOSEU NEGATIVE 10/28/2020 0429   HGBUR SMALL (A) 10/28/2020 0429   BILIRUBINUR NEGATIVE 10/28/2020 0429   KETONESUR NEGATIVE 10/28/2020 0429   PROTEINUR 100 (A) 10/28/2020 0429   NITRITE NEGATIVE 10/28/2020 0429   LEUKOCYTESUR NEGATIVE 10/28/2020 0429    Recent Results (from the past 240 hour(s))  Culture, respiratory (non-expectorated)     Status: None   Collection Time: 11/05/20 11:46 AM   Specimen: Tracheal Aspirate; Respiratory  Result Value Ref Range Status   Specimen Description   Final    TRACHEAL ASPIRATE Performed at [facility], [address], [place], [place] [address]    Special Requests   Final    NONE Performed at [facility],  [address], [place], [place] [address]    Gram Stain   Final    MODERATE WBC PRESENT,BOTH PMN AND MONONUCLEAR FEW GRAM POSITIVE COCCI FEW GRAM NEGATIVE RODS RARE GRAM POSITIVE RODS    Culture   Final    FEW Normal respiratory flora-no Staph aureus or Pseudomonas seen Performed at [facility], [address], [place],  [address]    Report Status 11/08/2020 FINAL  Final     Scheduled Meds: . albuterol  2.5 mg Nebulization TID  . apixaban  5 mg Per Tube BID  . chlorhexidine gluconate (MEDLINE KIT)  15 mL Mouth Rinse BID  . Chlorhexidine Gluconate Cloth  6 each Topical Daily  . cloNIDine  0.2 mg Per Tube TID  . feeding supplement (PROSource TF)  90 mL Per Tube TID  . free water  400 mL Per Tube Q4H  . mouth rinse  15 mL Mouth Rinse 2 times per day  . metoprolol tartrate  25 mg Per Tube BID  . neomycin-bacitracin-polymyxin  1 application Topical Daily  . pantoprazole sodium  40 mg Per Tube Q1200  . sodium chloride flush  3 mL Intravenous Q12H   Continuous Infusions: . sodium chloride    . sodium chloride 250 mL (11/02/20 1546)  . dexmedetomidine (PRECEDEX) IV infusion Stopped (11/11/20 0900)  . feeding supplement (VITAL HIGH PROTEIN) 1,000 mL (11/11/20 0754)   Procedures/Studies: CT ABDOMEN WO CONTRAST  Result Date: 11/08/2020 CLINICAL DATA:  55 year old male undergoing anatomic evaluation for possible percutaneous gastrostomy tube placement. EXAM: CT ABDOMEN WITHOUT CONTRAST TECHNIQUE: Multidetector CT imaging of the abdomen was performed following the standard protocol without IV contrast. COMPARISON:  Prior CT scan of the abdomen and pelvis 06/12/2018 FINDINGS: Lower chest: Small right and trace left pleural effusions. Small 0.4 cm nodule in the right middle lobe, unchanged compared to prior imaging dating back to 2019 and therefore benign. Combination of volume loss/atelectasis and consolidative changes present in both lower lobes. Cardiomegaly. No pericardial effusion.  Calcifications present along the coronary arteries. Hepatobiliary: Nodular hepatic contour with relative hypertrophy of the left hepatic lobe. These findings are morphologically compatible with cirrhosis. No definite discrete mass within the limitations of noncontrast enhanced technique. Gallbladder is unremarkable. No intra or extrahepatic biliary ductal dilatation. Pancreas: Unremarkable. No pancreatic ductal dilatation or surrounding inflammatory changes. Spleen: Normal in size without focal abnormality. Adrenals/Urinary Tract: Unremarkable adrenal glands. Nonspecific perinephric stranding bilaterally. No hydronephrosis or nephrolithiasis. Stomach/Bowel: Gastric tube terminates in the stomach. Normal anatomic position of the stomach without evidence of colonic interposition or other complicating feature. Colonic diverticular disease without CT evidence of active inflammation. Vascular/Lymphatic: Limited evaluation in the absence of intravenous contrast. Mild scattered atherosclerotic vascular calcifications. No aneurysm. No suspicious  lymphadenopathy. Other: No evidence of ascites. Musculoskeletal: No acute fracture or aggressive appearing lytic or blastic osseous lesion. IMPRESSION: 1. Anatomy is suitable for percutaneous gastrostomy tube placement. 2. Small right and trace left pleural effusions with associated bibasilar atelectasis and consolidation which may reflect pneumonia or aspiration. 3. Cardiomegaly. 4. Coronary artery calcifications. 5. Hepatic cirrhosis. 6. Colonic diverticular disease without CT evidence of active inflammation. 7.  Aortic Atherosclerosis (ICD10-I70.0). Electronically Signed   By: Jacqulynn Cadet M.D.   On: 11/08/2020 15:46   IR GASTROSTOMY TUBE MOD SED  Result Date: 11/10/2020 INDICATION: 55 year old male with a history dysphagia EXAM: PERC PLACEMENT GASTROSTOMY MEDICATIONS: 2 g Ancef; Antibiotics were administered within 1 hour of the procedure. ANESTHESIA/SEDATION: Versed 1.5  mg IV; Fentanyl 100 mcg IV Moderate Sedation Time:  10 The patient was continuously monitored during the procedure by the interventional radiology nurse under my direct supervision. CONTRAST:  8m OMNIPAQUE IOHEXOL 300 MG/ML SOLN - administered into the gastric lumen. FLUOROSCOPY TIME:  Fluoroscopy Time: 2 minutes 36 seconds  ). COMPLICATIONS: None PROCEDURE: Informed written consent was obtained from the patient and the patient's family after a thorough discussion of the procedural risks, benefits and alternatives. All questions were addressed. Maximal Sterile Barrier Technique was utilized including caps, mask, sterile gowns, sterile gloves, sterile drape, hand hygiene and skin antiseptic. A timeout was performed prior to the initiation of the procedure. The epigastrium was prepped with Betadine in a sterile fashion, and a sterile drape was applied covering the operative field. A sterile gown and sterile gloves were used for the procedure. A 5-French orogastric tube is placed under fluoroscopic guidance. Scout imaging of the abdomen confirms barium within the transverse colon. The stomach was distended with gas. Under fluoroscopic guidance, an 18 gauge needle was utilized to puncture the anterior wall of the body of the stomach. An Amplatz wire was advanced through the needle passing a T fastener into the lumen of the stomach. The T fastener was secured for gastropexy. A 9-French sheath was inserted. A snare was advanced through the 9-French sheath. A BBritta Mccreedywas advanced through the orogastric tube. It was snared then pulled out the oral cavity, pulling the snare, as well. The leading edge of the gastrostomy was attached to the snare. It was then pulled down the esophagus and out the percutaneous site. Tube secured in place. Contrast was injected. Patient tolerated the procedure well and remained hemodynamically stable throughout. No complications were encountered and no significant blood loss encountered.  IMPRESSION: Status post fluoroscopic placed percutaneous gastrostomy tube, with 20 [place]through. Signed, JDulcy Fanny WEarleen Newport DO [facility] Electronically Signed   By: JCorrie MckusickD.O.   On: 11/10/2020 13:07   [place]NAL  Result Date: 10/28/2020 CLINICAL DATA:  Chronic kidney disease.  Acute kidney injury. EXAM: RENAL / URINARY TRACT ULTRASOUND COMPLETE COMPARISON:  CT 06/12/2018 FINDINGS: Right Kidney: Renal measurements: 11.0 x 6.4 x 5.2 cm = volume: 190 mL. Increased echogenicity. No focal lesion or hydronephrosis. Left Kidney: Renal measurements: 11.2 x 7.6 x 6.2 cm = volume: 276 mL. Increased echogenicity. No focal lesion or hydronephrosis. Bladder: Foley catheter within the bladder. Other: None. IMPRESSION: Slightly enlarged kidneys showing increased echogenicity. Findings could be due to chronic diabetic glomerulonephropathy or acute nephritis. No obstruction or focal lesion. Electronically Signed   By: MNelson ChimesM.D.   On: 10/28/2020 14:30   DG Chest Port 1 View  Result Date: 11/09/2020 CLINICAL DATA:  Respiratory failure EXAM: PORTABLE  CHEST 1 VIEW COMPARISON:  11/05/2020 FINDINGS: Tracheostomy and nasogastric tube extending into the upper abdomen beyond the margin of the examination are again identified. Lung volumes are small and pulmonary insufflation has diminished since prior examination. Mild left basilar atelectasis or infiltrate. No pneumothorax or pleural effusion. Mild cardiomegaly is stable. Pulmonary vascularity is normal when accounting for poor pulmonary insufflation. No acute bone abnormality. IMPRESSION: Stable support tubes. Progressive pulmonary hypoinflation. Probable developing retrocardiac atelectasis. Stable cardiomegaly Electronically Signed   By: Fidela Salisbury MD   On: 11/09/2020 04:52   DG Chest Port 1 View  Result Date: 11/05/2020 CLINICAL DATA:  Respiratory failure EXAM: PORTABLE CHEST 1 VIEW  COMPARISON:  11/02/2020 FINDINGS: Tracheostomy and NG tube are unchanged. Cardiomegaly. Mild vascular congestion. Bilateral airspace opacities again noted, worse at the left base. This is stable since prior study. No visible effusions or pneumothorax. IMPRESSION: Cardiomegaly. Bilateral airspace disease, most pronounced in the left base, stable since prior study. Electronically Signed   By: Rolm Baptise M.D.   On: 11/05/2020 08:03   DG CHEST PORT 1 VIEW  Result Date: 11/02/2020 CLINICAL DATA:  NG tube placement. EXAM: PORTABLE CHEST 1 VIEW COMPARISON:  Radiograph yesterday. FINDINGS: The tip of the enteric tube is below the diaphragm, not included in the field of view. The side-port is not well visualized but likely below the diaphragm. Stable cardiomegaly. Patchy airspace opacities in both lung bases, unchanged. IMPRESSION: 1. Tip of the enteric tube below the diaphragm, not included in the field of view. 2. Unchanged patchy bibasilar airspace opacities. Electronically Signed   By: Keith Rake M.D.   On: 11/02/2020 21:51   DG CHEST PORT 1 VIEW  Result Date: 11/01/2020 CLINICAL DATA:  Acute on chronic respiratory failure. EXAM: PORTABLE CHEST 1 VIEW COMPARISON:  10/31/2020 FINDINGS: The tracheostomy tube is stable. The NG tube is stable. Much improved left lung aeration when compared to the prior study. The left upper lobe is now aerated. Persistent airspace process in the right lung and at the left lung base. No pleural effusions or pneumothorax. IMPRESSION: 1. Much improved left lung aeration when compared to the prior study. 2. Persistent right lung and left basilar airspace process. Electronically Signed   By: Marijo Sanes M.D.   On: 11/01/2020 05:55   DG CHEST PORT 1 VIEW  Result Date: 10/31/2020 CLINICAL DATA:  Acute on chronic respiratory EXAM: PORTABLE CHEST 1 VIEW COMPARISON:  Failure four days ago FINDINGS: Enteric tube which reaches the stomach. Tracheostomy tube in place. Interval  white out of the left chest with volume loss. Progressive hazy opacification of the right lower chest. Heart size is largely obscured. No visible air leak. IMPRESSION: 1. Interval white out of the left chest, likely plugging and pulmonary collapse. 2. Progressive airspace disease or pleural effusion on the right. Electronically Signed   By: Monte Fantasia M.D.   On: 10/31/2020 04:22   DG Chest Port 1 View  Result Date: 10/27/2020 CLINICAL DATA:  Acute respiratory failure EXAM: PORTABLE CHEST 1 VIEW COMPARISON:  Yesterday FINDINGS: Enteric and tracheostomy tubes remain in good position. Cardiomegaly. Interstitial opacity which could be bronchitic or congestive. There is improved aeration at the right base. Negative for pneumothorax. IMPRESSION: 1. Improved aeration. 2. Bronchitic or congestive interstitial opacity. Electronically Signed   By: Monte Fantasia M.D.   On: 10/27/2020 07:46   DG Chest Port 1 View  Result Date: 10/26/2020 CLINICAL DATA:  Shortness of breath for 2-3 days EXAM: PORTABLE CHEST 1 VIEW  COMPARISON:  Three days ago FINDINGS: Progressive hazy appearance of the right mid to lower chest. Dense retrocardiac opacity persists. An enteric tube and tracheostomy tube remain in place. Cardiomegaly and vascular pedicle widening. IMPRESSION: Pleural fluid/pulmonary opacification at the bases which has progressed from 3 days ago. Electronically Signed   By: Monte Fantasia M.D.   On: 10/26/2020 06:02   DG CHEST PORT 1 VIEW  Result Date: 10/23/2020 CLINICAL DATA:  Shortness of breath. EXAM: PORTABLE CHEST 1 VIEW COMPARISON:  October 22, 2020 FINDINGS: The near complete opacification of the left hemithorax seen on the comparison chest x-ray has resolved. Persistent bibasilar opacities are likely atelectasis. No pneumothorax. A tracheostomy tube is in good position. An NG tube terminates in the stomach. No other acute abnormalities. IMPRESSION: 1. Support apparatus as above. 2. Bibasilar  atelectasis and a possible small left effusion. Improved aeration of the left lung. Electronically Signed   By: Dorise Bullion III M.D   On: 10/23/2020 14:57   DG Chest Port 1 View  Result Date: 10/22/2020 CLINICAL DATA:  Acute respiratory failure. EXAM: PORTABLE CHEST 1 VIEW COMPARISON:  10/20/2020 FINDINGS: Tracheostomy tube overlies the airway. The cardiac silhouette is enlarged. There is persistent mild central pulmonary vascular congestion without overt edema. Airspace opacities in the left greater than right mid and lower lungs have slightly improved. Small pleural effusions are questioned. No pneumothorax is identified. IMPRESSION: Slight improvement of bilateral airspace opacities which could reflect pneumonia or atelectasis. Electronically Signed   By: [name] M.D.   On: 10/22/2020 09:16   DG Chest Port 1 View  Result Date: 10/20/2020 CLINICAL DATA:  Questionable sepsis EXAM: PORTABLE CHEST 1 VIEW COMPARISON:  Chest x-ray 06/12/2018 report without images. FINDINGS: Tracheostomy tube terminates approximately 7.5 cm above the carina. The heart size and mediastinal contours are not well visualized. Bilateral hilar vasculature prominence. Aortic arch calcifications. Silhouetting off of the left hemidiaphragm and left cardiac border. Partial silhouetting off the right cardiac border and right hemidiaphragm. Increased interstitial markings. Likely at least trace to small volume left pleural effusion. No pneumothorax. No acute osseous abnormality. IMPRESSION: 1. Left lung opacity and likely at least trace to small volume left pleural effusion with silhouetting off of the left diaphragm and heart border. Concern for infection/inflammation. 2. Right lower lobe and middle lobe airspace opacity could represent a combination of atelectasis, infection, inflammation. 3. At least mild pulmonary edema. 4. Consider chest x-ray PA and lateral view for further evaluation. Electronically Signed   By: Iven Finn M.D.   On: 10/20/2020 12:26   DG Chest Port 1V same Day  Result Date: 10/22/2020 CLINICAL DATA:  Status post NG tube placement. EXAM: PORTABLE CHEST 1 VIEW COMPARISON:  Single-view of the chest earlier today. FINDINGS: The upper chest is off the margin of the film. NG tube is looped in the upper trachea or esophagus. Visualized left chest is completely whited out on this study. IMPRESSION: NG tube is looped in the upper esophagus or trachea. Complete whiteout of the left chest is new since the study earlier today could be due to mucous plugging and left lung collapse or rapidly developing pleural effusion and airspace disease. Electronically Signed   By: [name] M.D.   On: 10/22/2020 15:16   DG Abd Portable 1V  Result Date: 10/22/2020 CLINICAL DATA:  Nasogastric tube placement EXAM: PORTABLE ABDOMEN - 1 VIEW COMPARISON:  Portable exam 1613 hours compared to 1523 hours FINDINGS: Tip of nasogastric tube projects over distal  gastric antrum, consider withdrawal 5 cm. Bowel gas pattern normal. Opacification of the inferior LEFT hemithorax again seen. IMPRESSION: Tip of nasogastric tube projects over distal gastric antrum. Consider withdrawal of nasogastric tube 5 cm. Electronically Signed   By: [name] M.D.   On: 10/22/2020 16:36   DG Abd Portable 1V  Result Date: 10/22/2020 CLINICAL DATA:  Nasogastric tube placement EXAM: PORTABLE ABDOMEN - 1 VIEW COMPARISON:  None. FINDINGS: Nasogastric tube extends into the gastric cardia where it loops upon itself with the tip at the junction of the mid and distal thirds of the esophagus. There is no bowel dilatation or air-fluid level to suggest bowel obstruction. No free air. There is consolidation left lower lobe. IMPRESSION: Nasogastric tube extends into the stomach where it loops upon itself with the tip at the junction of the mid and distal thirds of the esophagus. No bowel obstruction or free air evident. Consolidation left lower lobe. These  results will be called to the ordering clinician or representative by the Radiologist Assistant, and communication documented in the PACS or [employer]. Electronically Signed   By: Lowella Grip III M.D.   On: 10/22/2020 15:34   ECHOCARDIOGRAM COMPLETE  Result Date: 10/21/2020    ECHOCARDIOGRAM REPORT   Patient Name:   Jack Cox Date of Exam: 10/21/2020 Medical Rec #:  [id number]        Height:       68.0 in Accession #:    [id number]       Weight:       325.8 lb Date of Birth:  1966/02/07        BSA:          2.515 m Patient Age:    89 years         BP:           112/61 mmHg Patient Gender: M                HR:           51 bpm. Exam Location:  [facility] Procedure: 2D Echo Indications:    CHF-Acute Diastolic B63.89  History:        Patient has no prior history of Echocardiogram examinations.                 CHF; Risk Factors:Non-Smoker. Acute on chronic respiratory                 failure with hypoxia.  Sonographer:    Leavy Cella RDCS (AE) Referring Phys: [phone] DAVID TAT IMPRESSIONS  1. Left ventricular ejection fraction, by estimation, is 60 to 65%. The left ventricle has normal function. The left ventricle has no regional wall motion abnormalities. There is severe left ventricular hypertrophy. Left ventricular diastolic parameters  are indeterminate. There is the interventricular septum is flattened in systole and diastole, consistent with right ventricular pressure and volume overload.  2. Right ventricular systolic function is mildly reduced. The right ventricular size is mildly enlarged. Tricuspid regurgitation signal is inadequate for assessing PA pressure.  3. Right atrial size was upper normal.  4. The pericardial effusion is posterior to the left ventricle.  5. The mitral valve is grossly normal. Trivial mitral valve regurgitation.  6. The aortic valve is tricuspid. Aortic valve regurgitation is not visualized.  7. The inferior vena cava is normal in size with greater than 50%  respiratory variability, suggesting right atrial pressure of 3 mmHg. FINDINGS  Left Ventricle: Left ventricular ejection fraction,  by estimation, is 60 to 65%. The left ventricle has normal function. The left ventricle has no regional wall motion abnormalities. The left ventricular internal cavity size was normal in size. There is  severe left ventricular hypertrophy. The interventricular septum is flattened in systole and diastole, consistent with right ventricular pressure and volume overload. Left ventricular diastolic parameters are indeterminate. Right Ventricle: The right ventricular size is mildly enlarged. No increase in right ventricular wall thickness. Right ventricular systolic function is mildly reduced. Tricuspid regurgitation signal is inadequate for assessing PA pressure. Left Atrium: Left atrial size was normal in size. Right Atrium: Right atrial size was upper normal. Pericardium: Trivial pericardial effusion is present. The pericardial effusion is posterior to the left ventricle. Mitral Valve: The mitral valve is grossly normal. Trivial mitral valve regurgitation. Tricuspid Valve: The tricuspid valve is grossly normal. Tricuspid valve regurgitation is mild. Aortic Valve: The aortic valve is tricuspid. There is moderate aortic valve annular calcification. Aortic valve regurgitation is not visualized. Pulmonic Valve: The pulmonic valve was grossly normal. Pulmonic valve regurgitation is trivial. Aorta: The aortic root is normal in size and structure. Venous: The inferior vena cava is normal in size with greater than 50% respiratory variability, suggesting right atrial pressure of 3 mmHg. IAS/Shunts: No atrial level shunt detected by color flow Doppler.  LEFT VENTRICLE PLAX 2D LVIDd:         4.66 cm  Diastology LVIDs:         2.03 cm  LV e' medial:    5.87 cm/s LV PW:         1.76 cm  LV E/e' medial:  14.0 LV IVS:        1.73 cm  LV e' lateral:   8.49 cm/s LVOT diam:     1.90 cm  LV E/e' lateral: 9.7  LVOT Area:     2.84 cm  RIGHT VENTRICLE RV S prime:     19.40 cm/s TAPSE (M-mode): 1.9 cm LEFT ATRIUM             Index       RIGHT ATRIUM           Index LA diam:        3.50 cm 1.39 cm/m  RA Area:     24.60 cm LA Vol (A2C):   43.9 ml 17.46 ml/m RA Volume:   82.30 ml  32.73 ml/m LA Vol (A4C):   43.8 ml 17.42 ml/m LA Biplane Vol: 44.4 ml 17.66 ml/m   AORTA Ao Root diam: 3.10 cm MITRAL VALVE MV Area (PHT): 2.64 cm    SHUNTS MV Decel Time: 287 msec    Systemic Diam: 1.90 cm MV E velocity: 82.30 cm/s MV A velocity: 59.10 cm/s MV E/A ratio:  1.39 Rozann Lesches MD Electronically signed by Rozann Lesches MD Signature Date/Time: 10/21/2020/4:33:43 PM    Final    Time Spent: 33 minutes    Irwin Brakeman, MD  How to contact the [facility] Attending or Consulting provider [place] or covering provider during after hours [place], for this patient?  1. Check the care team in [facility] and look for a) attending/consulting [facility] provider listed and b) the [facility] team listed 2. Log into [email or web address] and use [place]'s universal password to access. If you do not have the password, please contact the hospital operator. 3. Locate the [facility] provider you are looking for under [facility] and page to a number that you can be directly reached. 4. If you  still have difficulty reaching the provider, please page the [facility] (Director on Call) for the Hospitalists listed on amion for assistance.  11/11/2020, 3:17 PM   LOS: 22 days

## 2020-11-11 NOTE — Progress Notes (Signed)
Nutrition Follow-up  DOCUMENTATION CODES:   Morbid obesity  INTERVENTION:  Continue VHP @ 40 ml/hr with 90 ml ProSource TF via PEG  Regimen provides 1200 kcal, 150 grams of protein 806 ml water   400 ml free water Q4 per MD (additional 2400 ml water/day)   NUTRITION DIAGNOSIS:   Inadequate oral intake related to acute illness (respiratory failure) as evidenced by NPO status. -ongoing  GOAL:   Provide needs based on ASPEN/SCCM guidelines -meeting  MONITOR:   Vent status,Labs,TF tolerance,I & O's,Skin  REASON FOR ASSESSMENT:   Ventilator    ASSESSMENT:   Patient is a 55 yo male with history of CKD, CHF. GOUT, Hypertension and Tracheostomy dependence. Chest x-ray findings- left side pleural effusion.  12/15- new cuffed trach 12/16- TF 12/21- bronchoscopy  1/05- PEG  Pt transported to [facility] for PEG placement yesterday, tube feedings resumed via PEG today. Pt tolerating trach collar this morning with vent support at night. Plans to transfer to [facility] pending insurance decision.   Weights +8 lbs in the last week, -7.7 lbs since admit I/Os: -2844.7 ml since admit UOP: 1150 ml x 24 hrs  Mild pitting edema BLE; BUE noted  Medications reviewed and include: Protonix, Prosource Drips: Precedex - stopped  VHP @ 40 ml/hr Free water 400 ml every 4 hours  Labs: CBGs 106,80,82,80, BUN 80 (H), Cr 1.99 (H), Ca 10.8 (H), P 5.3 (H)   Diet Order:   Diet Order            Diet NPO time specified Except for: Sips with Meds, Ice Chips  Diet effective midnight                 EDUCATION NEEDS:   Not appropriate for education at this time  Skin:  Skin Assessment: Reviewed RN Assessment  Last BM:  1/04  Height:   Ht Readings from Last 1 Encounters:  11/08/20 5\' 7"  (1.702 m)    Weight:   Wt Readings from Last 1 Encounters:  11/10/20 (!) 143.1 kg    BMI:  Body mass index is 49.41 kg/m.  Estimated Nutritional Needs:   Kcal:  5465-6812  Protein:   135-150  Fluid:  >1.5 L/day   Lajuan Lines, RD, LDN Clinical Nutrition After Hours/Weekend Pager # in [place]

## 2020-11-11 NOTE — Progress Notes (Signed)
RT spoke to Dr. Halford Chessman about  Letting the pt wear the ATC through the night if the pt is able to maintain his SATS.

## 2020-11-12 ENCOUNTER — Inpatient Hospital Stay (HOSPITAL_COMMUNITY): Payer: Medicare Other

## 2020-11-12 DIAGNOSIS — N179 Acute kidney failure, unspecified: Secondary | ICD-10-CM | POA: Diagnosis not present

## 2020-11-12 DIAGNOSIS — Z9989 Dependence on other enabling machines and devices: Secondary | ICD-10-CM | POA: Diagnosis not present

## 2020-11-12 DIAGNOSIS — Z431 Encounter for attention to gastrostomy: Secondary | ICD-10-CM | POA: Diagnosis not present

## 2020-11-12 DIAGNOSIS — N189 Chronic kidney disease, unspecified: Secondary | ICD-10-CM | POA: Diagnosis not present

## 2020-11-12 DIAGNOSIS — R1310 Dysphagia, unspecified: Secondary | ICD-10-CM | POA: Diagnosis not present

## 2020-11-12 DIAGNOSIS — R918 Other nonspecific abnormal finding of lung field: Secondary | ICD-10-CM | POA: Diagnosis not present

## 2020-11-12 DIAGNOSIS — I10 Essential (primary) hypertension: Secondary | ICD-10-CM | POA: Diagnosis not present

## 2020-11-12 DIAGNOSIS — T17500A Unspecified foreign body in bronchus causing asphyxiation, initial encounter: Secondary | ICD-10-CM

## 2020-11-12 DIAGNOSIS — Z930 Tracheostomy status: Secondary | ICD-10-CM | POA: Diagnosis not present

## 2020-11-12 DIAGNOSIS — I482 Chronic atrial fibrillation, unspecified: Secondary | ICD-10-CM | POA: Diagnosis not present

## 2020-11-12 DIAGNOSIS — J9621 Acute and chronic respiratory failure with hypoxia: Secondary | ICD-10-CM | POA: Diagnosis not present

## 2020-11-12 DIAGNOSIS — M2550 Pain in unspecified joint: Secondary | ICD-10-CM | POA: Diagnosis not present

## 2020-11-12 DIAGNOSIS — Z299 Encounter for prophylactic measures, unspecified: Secondary | ICD-10-CM | POA: Diagnosis not present

## 2020-11-12 DIAGNOSIS — Z9981 Dependence on supplemental oxygen: Secondary | ICD-10-CM | POA: Diagnosis not present

## 2020-11-12 DIAGNOSIS — I480 Paroxysmal atrial fibrillation: Secondary | ICD-10-CM | POA: Diagnosis not present

## 2020-11-12 DIAGNOSIS — Z9911 Dependence on respirator [ventilator] status: Secondary | ICD-10-CM | POA: Diagnosis not present

## 2020-11-12 DIAGNOSIS — Z743 Need for continuous supervision: Secondary | ICD-10-CM | POA: Diagnosis not present

## 2020-11-12 DIAGNOSIS — I4891 Unspecified atrial fibrillation: Secondary | ICD-10-CM | POA: Diagnosis not present

## 2020-11-12 DIAGNOSIS — I5030 Unspecified diastolic (congestive) heart failure: Secondary | ICD-10-CM | POA: Diagnosis not present

## 2020-11-12 DIAGNOSIS — I700 Atherosclerosis of aorta: Secondary | ICD-10-CM | POA: Diagnosis not present

## 2020-11-12 DIAGNOSIS — J9601 Acute respiratory failure with hypoxia: Secondary | ICD-10-CM | POA: Diagnosis not present

## 2020-11-12 DIAGNOSIS — G4733 Obstructive sleep apnea (adult) (pediatric): Secondary | ICD-10-CM | POA: Diagnosis not present

## 2020-11-12 DIAGNOSIS — I130 Hypertensive heart and chronic kidney disease with heart failure and stage 1 through stage 4 chronic kidney disease, or unspecified chronic kidney disease: Secondary | ICD-10-CM | POA: Diagnosis not present

## 2020-11-12 DIAGNOSIS — Z931 Gastrostomy status: Secondary | ICD-10-CM | POA: Diagnosis not present

## 2020-11-12 DIAGNOSIS — J9620 Acute and chronic respiratory failure, unspecified whether with hypoxia or hypercapnia: Secondary | ICD-10-CM | POA: Diagnosis not present

## 2020-11-12 DIAGNOSIS — Z7401 Bed confinement status: Secondary | ICD-10-CM | POA: Diagnosis not present

## 2020-11-12 DIAGNOSIS — R5381 Other malaise: Secondary | ICD-10-CM | POA: Diagnosis not present

## 2020-11-12 DIAGNOSIS — I509 Heart failure, unspecified: Secondary | ICD-10-CM | POA: Diagnosis not present

## 2020-11-12 DIAGNOSIS — R54 Age-related physical debility: Secondary | ICD-10-CM | POA: Diagnosis not present

## 2020-11-12 DIAGNOSIS — Z20822 Contact with and (suspected) exposure to covid-19: Secondary | ICD-10-CM | POA: Diagnosis not present

## 2020-11-12 DIAGNOSIS — R2689 Other abnormalities of gait and mobility: Secondary | ICD-10-CM | POA: Diagnosis not present

## 2020-11-12 DIAGNOSIS — I5033 Acute on chronic diastolic (congestive) heart failure: Secondary | ICD-10-CM | POA: Diagnosis not present

## 2020-11-12 DIAGNOSIS — J9690 Respiratory failure, unspecified, unspecified whether with hypoxia or hypercapnia: Secondary | ICD-10-CM | POA: Diagnosis not present

## 2020-11-12 DIAGNOSIS — J9811 Atelectasis: Secondary | ICD-10-CM | POA: Diagnosis not present

## 2020-11-12 DIAGNOSIS — J9622 Acute and chronic respiratory failure with hypercapnia: Secondary | ICD-10-CM | POA: Diagnosis not present

## 2020-11-12 DIAGNOSIS — R0902 Hypoxemia: Secondary | ICD-10-CM | POA: Diagnosis not present

## 2020-11-12 DIAGNOSIS — R1312 Dysphagia, oropharyngeal phase: Secondary | ICD-10-CM | POA: Diagnosis not present

## 2020-11-12 DIAGNOSIS — J398 Other specified diseases of upper respiratory tract: Secondary | ICD-10-CM | POA: Diagnosis not present

## 2020-11-12 DIAGNOSIS — I4892 Unspecified atrial flutter: Secondary | ICD-10-CM | POA: Diagnosis not present

## 2020-11-12 DIAGNOSIS — N184 Chronic kidney disease, stage 4 (severe): Secondary | ICD-10-CM | POA: Diagnosis not present

## 2020-11-12 DIAGNOSIS — M6281 Muscle weakness (generalized): Secondary | ICD-10-CM | POA: Diagnosis not present

## 2020-11-12 DIAGNOSIS — I081 Rheumatic disorders of both mitral and tricuspid valves: Secondary | ICD-10-CM | POA: Diagnosis not present

## 2020-11-12 DIAGNOSIS — Z430 Encounter for attention to tracheostomy: Secondary | ICD-10-CM | POA: Diagnosis not present

## 2020-11-12 LAB — GLUCOSE, CAPILLARY
Glucose-Capillary: 116 mg/dL — ABNORMAL HIGH (ref 70–99)
Glucose-Capillary: 128 mg/dL — ABNORMAL HIGH (ref 70–99)
Glucose-Capillary: 98 mg/dL (ref 70–99)
Glucose-Capillary: 85 mg/dL (ref 70–99)

## 2020-11-12 LAB — CBC
HCT: 39.9 % (ref 39.0–52.0)
Hemoglobin: 11.6 g/dL — ABNORMAL LOW (ref 13.0–17.0)
MCH: 29.1 pg (ref 26.0–34.0)
MCHC: 29.1 g/dL — ABNORMAL LOW (ref 30.0–36.0)
MCV: 100.3 fL — ABNORMAL HIGH (ref 80.0–100.0)
Platelets: 282 10*3/uL (ref 150–400)
RBC: 3.98 MIL/uL — ABNORMAL LOW (ref 4.22–5.81)
RDW: 14.1 % (ref 11.5–15.5)
WBC: 5.1 10*3/uL (ref 4.0–10.5)
nRBC: 0 % (ref 0.0–0.2)

## 2020-11-12 LAB — RENAL FUNCTION PANEL
Albumin: 2.5 g/dL — ABNORMAL LOW (ref 3.5–5.0)
Anion gap: 9 (ref 5–15)
BUN: 79 mg/dL — ABNORMAL HIGH (ref 6–20)
CO2: 29 mmol/L (ref 22–32)
Calcium: 10.8 mg/dL — ABNORMAL HIGH (ref 8.9–10.3)
Chloride: 104 mmol/L (ref 98–111)
Creatinine, Ser: 1.77 mg/dL — ABNORMAL HIGH (ref 0.61–1.24)
GFR, Estimated: 45 mL/min — ABNORMAL LOW (ref >60)
Glucose, Bld: 114 mg/dL — ABNORMAL HIGH (ref 70–99)
Phosphorus: 4.3 mg/dL (ref 2.5–4.6)
Potassium: 3.9 mmol/L (ref 3.5–5.1)
Sodium: 142 mmol/L (ref 135–145)

## 2020-11-12 MED ORDER — ACETAMINOPHEN 325 MG PO TABS: 650.0000 mg | ORAL_TABLET | ORAL | Status: DC | PRN

## 2020-11-12 MED ORDER — CHLORHEXIDINE GLUCONATE 0.12% ORAL RINSE (MEDLINE KIT): 15.0000 mL | Freq: Two times a day (BID) | OROMUCOSAL | 0 refills | Status: DC

## 2020-11-12 MED ORDER — APIXABAN 5 MG PO TABS: 5.0000 mg | ORAL_TABLET | Freq: Two times a day (BID) | ORAL | Status: AC

## 2020-11-12 MED ORDER — FREE WATER: 400.0000 mL | Status: DC

## 2020-11-12 MED ORDER — ALBUTEROL SULFATE (2.5 MG/3ML) 0.083% IN NEBU: 2.5000 mg | INHALATION_SOLUTION | Freq: Two times a day (BID) | RESPIRATORY_TRACT | 12 refills | Status: DC

## 2020-11-12 MED ORDER — ALBUTEROL SULFATE (2.5 MG/3ML) 0.083% IN NEBU
2.5000 mg | INHALATION_SOLUTION | Freq: Two times a day (BID) | RESPIRATORY_TRACT | Status: DC
  Filled 2020-11-12: qty 3

## 2020-11-12 MED ORDER — METOPROLOL TARTRATE 25 MG PO TABS: 25.0000 mg | ORAL_TABLET | Freq: Two times a day (BID) | ORAL | Status: DC

## 2020-11-12 MED ORDER — PANTOPRAZOLE SODIUM 40 MG PO PACK: 40.0000 mg | PACK | Freq: Every day | ORAL | Status: DC

## 2020-11-12 MED ORDER — CLONIDINE HCL 0.2 MG PO TABS: 0.2000 mg | ORAL_TABLET | Freq: Three times a day (TID) | ORAL | Status: DC

## 2020-11-12 NOTE — Evaluation (Signed)
  Modified Barium Swallow Progress Note  Patient Details  Name: Jack Cox MRN: [id number] Date of Birth: [birth year]  Today's Date: 11/12/2020  Modified Barium Swallow completed.  Full report located under Chart Review in the Imaging Section.  Brief recommendations include the following:  Clinical Impression  Pt presented with oropharyngeal swallowing to be grossly Within Normal Limits. No penetration or aspiration was visualized with any textures or consistencies assessed - assessed thin, puree, regular and administration of barium tablet with thin liquids; Pt demonstrated good hyolaryngeal excursion, adequate laryngeal vestibule closure and good airway protection. ST will f/u X1 to ensure diet tolerance, thank you.   Swallow Evaluation Recommendations       SLP Diet Recommendations: Dysphagia 3 (Mech soft) solids;Thin liquid   Liquid Administration via: Cup;Straw   Medication Administration: Whole meds with liquid   Supervision: Patient able to self feed;Staff to assist with self feeding;Full supervision/cueing for compensatory strategies   Compensations: Minimize environmental distractions;Slow rate;Small sips/bites;Follow solids with liquid   Postural Changes: Remain semi-upright after after feeds/meals (Comment);Seated upright at 90 degrees   Oral Care Recommendations: Oral care BID   Other Recommendations: Have oral suction available   Angelissa Supan H. Roddie Mc, CCC-SLP Speech Language Pathologist  Wende Bushy 11/12/2020,11:22 AM

## 2020-11-12 NOTE — Discharge Summary (Signed)
Physician Discharge Summary  Jack Cox SEG:[id number] DOB: [birth year] DOA: 10/20/2020  PCP: [name], MD  Admit date: 10/20/2020 Discharge date: 11/12/2020   Disposition:  [facility]AC  Discharge Condition: STABLE   CODE STATUS: FULL  DIET: DYS 3    Brief Hospitalization Summary: Please see all hospital notes, images, labs for full details of the hospitalization. Brief History: 55 y.o.malewith medical history ofdiastolic CHF, atrial flutter, obstructive sleep apnea, hypertension, right bundle branch block presenting with shortness of breath for the last 2 to 3 days. Unfortunately, the patient is encephalopathic at the time of my evaluation; therefore, history is limited. Apparently, the patient went to see his nephrologist earlier on 10/20/2020. He was noted to have oxygen saturation in the 60s. As result, he was sent to the emergency department for further evaluation. Initially, the patient was awake and conversant with oxygen saturation in the low 70s. He was placed on a nonrebreather after which the patient became obtunded. VBG showed pH 7.1 66/97/106 on 80%. Subsequently, the patient was placed on the ventilator with PRVC,PEEP of 5, FiO2 70%. After approximately 10 to 15 minutes on the ventilator, the patient remained somnolent, but awakened and was able to tell me his name. He did state that he has not been taking his torsemide as directed. In addition, the patient has been sleeping in a recliner for an unclear duration of time secondary to shortness of breath. At baseline, the patient has a size 4 noncuffed tracheostomy. He is on supplemental oxygen 2.5 L during the day and 4 L at nighttime.  There is no reports of nausea, vomiting, diarrhea, chest pain, abdominal pain.  In the emergency department, the patient was afebrile and hemodynamically stable with oxygen saturation 93-94% on the ventilator with FiO2 70%. Chest x-ray showed left-sided pleural effusion with increased  interstitial markings. BNP 790. PCT 0.12. BMP showed a serum creatinine 2.43. LFTs were unremarkable. WBC 8.1, hemoglobin 14.5, platelets 253,000. The patient was given ceftriaxone and azithromycin. He was also given furosemide 80 mg IV x1. COVID-19 RT-PCR negative. He was continued on IV lasix 80 mg bid with uptrending of his serum creatinine. Renal was consulted to assist.   Assessment/Plan: Acute on chronic respiratory failure with hypoxia and hypercarbia -Secondary to CHF in the setting of OSA -having mucus plugging--needs aggressive suctioning -12/17 and 12/26 CXR--white out of left lung likely mucus plugging -continue mucomyst and albuterol -10/26/20-bronchoscopy--Thick inspissated mucus in proximal airways & BL lower lobes, removed with suctioning - hypertonic saline nebulizer treatments started by pulmonology 12/22  FiO2 (%):  [40 %] 40 %  Pt stable to transfer to LTAC today.   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  Fever - RESOLVED -blood cultures x 2--neg to date -UA and urine culture--no significant pyuria -12/16 trach  aspirate-->flora -10/26/20-->BAL sent for culture -completed course of cefepime  Essential hypertension -metoprolol low dose with holding parameters and clonidine as ordered -BPs elevated, add PRN coverage as needed  OSA -The patient has a tracheostomy placed in 2001 -He has a size 4 cuffless trach  Right bundle branch block -This has been chronic with review of the medical records  Morbid obesity -BMI 52.94 -lifestyle modification  GOC -palliative following -currently full scope of care  Status is: Inpatient  Remains inpatient appropriate because:IV treatments appropriate due to intensity of illness or inability to take PO  Dispo: The patient is from:Home Anticipated d/c is GX:QJJH Anticipated d/c date is:11/12/2020  Patient currently is medically stable to d/c to LTAC.  Family Communication:updates at bedside  Consultants:pulm, renal  Code Status: FULL  DVT Prophylaxis: apixaban for full anticoagulation   Procedures: As Listed in Progress Note Above  Antibiotics: zmax 12/15 Ceftriaxone 12/15 Cefepime 12/21>>12/27  Discharge Diagnoses:  Active Problems:   OSA (obstructive sleep apnea)   Tracheostomy dependent (HCC)   Acute on chronic respiratory failure (HCC)   Acute on chronic respiratory failure with hypoxia and hypercapnia (HCC)   Acute on chronic diastolic CHF (congestive heart failure) (HCC)   Morbid obesity with BMI of 50.0-59.9, adult (HCC)   CKD (chronic kidney disease) stage 4, GFR 15-29 ml/min (HCC)   Atelectasis, left   AKI (acute kidney injury) ([place])   Acute respiratory failure with hypoxia and hypercapnia (HCC)   Acute on chronic congestive heart failure [facility])   Discharge Instructions:  Allergies as of 11/12/2020   No Known Allergies     Medication List    STOP taking these medications   albuterol 108 (90 Base) MCG/ACT inhaler Commonly known as: VENTOLIN HFA Replaced  by: albuterol (2.5 MG/3ML) 0.083% nebulizer solution   allopurinol 300 MG tablet Commonly known as: ZYLOPRIM   amLODipine 10 MG tablet Commonly known as: NORVASC   aspirin EC 81 MG tablet   buPROPion 100 MG 12 hr tablet Commonly known as: WELLBUTRIN SR   Cholecalciferol 25 MCG (1000 UT) tablet   clonazePAM 0.5 MG tablet Commonly known as: KLONOPIN   colchicine-probenecid 0.5-500 MG tablet   ferrous sulfate 325 (65 FE) MG tablet   furosemide 40 MG tablet Commonly known as: LASIX   hydrALAZINE 25 MG tablet Commonly known as: APRESOLINE   levofloxacin 500 MG tablet Commonly known as: LEVAQUIN   lisinopril 40 MG tablet Commonly known as: ZESTRIL   meloxicam 15 MG tablet Commonly known as: MOBIC   metoprolol succinate 50 MG 24 hr tablet Commonly known as: TOPROL-XL   mirtazapine 30 MG tablet Commonly known as: REMERON   multivitamin with minerals tablet   torsemide 20 MG tablet Commonly known as: DEMADEX   traMADol 50 MG tablet Commonly known as: ULTRAM     TAKE these medications   acetaminophen 325 MG tablet Commonly known as: TYLENOL Place 2 tablets (650 mg total) into feeding tube every 4 (four) hours as needed for headache or mild pain.   albuterol (2.5 MG/3ML) 0.083% nebulizer solution Commonly known as: PROVENTIL Take 3 mLs (2.5 mg total) by nebulization 2 (two) times daily. Replaces: albuterol 108 (90 Base) MCG/ACT inhaler   apixaban 5 MG Tabs tablet Commonly known as: ELIQUIS Place 1 tablet (5 mg total) into feeding tube 2 (two) times daily. What changed:   medication strength  how much to take  how to take this  when to take this   chlorhexidine gluconate (MEDLINE KIT) 0.12 % solution Commonly known as: PERIDEX 15 mLs by Mouth Rinse route 2 (two) times daily.   cloNIDine 0.2 MG tablet Commonly known as: CATAPRES Place 1 tablet (0.2 mg total) into feeding tube 3 (three) times daily.   free water Soln Place 400 mLs into feeding  tube every 4 (four) hours.   metoprolol tartrate 25 MG tablet Commonly known as: LOPRESSOR Place 1 tablet (25 mg total) into feeding tube 2 (two) times daily.   pantoprazole sodium 40 mg/20 mL Pack Commonly known as: PROTONIX Place 20 mLs (40 mg total) into feeding tube daily at 12 noon. Start taking on: November 13, 2020       No Known Allergies Allergies as of 11/12/2020   No Known Allergies     Medication List    STOP taking these medications   albuterol 108 (90 Base) MCG/ACT inhaler Commonly known as: VENTOLIN HFA Replaced by: albuterol (2.5 MG/3ML) 0.083% nebulizer solution   allopurinol 300 MG tablet Commonly known as: ZYLOPRIM   amLODipine 10 MG tablet Commonly known as: NORVASC   aspirin EC 81 MG tablet   buPROPion 100 MG 12 hr tablet Commonly known as: WELLBUTRIN SR   Cholecalciferol 25 MCG (1000 UT) tablet   clonazePAM 0.5 MG tablet Commonly known as: KLONOPIN   colchicine-probenecid 0.5-500 MG tablet   ferrous sulfate 325 (65 FE) MG tablet   furosemide 40 MG tablet Commonly known as: LASIX   hydrALAZINE 25 MG tablet Commonly known as: APRESOLINE   levofloxacin 500 MG tablet Commonly known as: LEVAQUIN   lisinopril 40 MG tablet Commonly known as: ZESTRIL   meloxicam 15 MG tablet Commonly known as: MOBIC   metoprolol succinate 50 MG 24 hr tablet Commonly known as: TOPROL-XL   mirtazapine 30 MG tablet Commonly known as: REMERON   multivitamin with minerals tablet   torsemide 20 MG tablet Commonly known as: DEMADEX   traMADol 50 MG tablet Commonly known as: ULTRAM     TAKE these medications   acetaminophen 325 MG tablet Commonly known as: TYLENOL Place 2 tablets (650 mg total) into feeding tube every 4 (four) hours as needed for headache or mild pain.   albuterol (2.5 MG/3ML) 0.083% nebulizer solution Commonly known as: PROVENTIL Take 3 mLs (2.5 mg total) by nebulization 2 (two) times daily. Replaces: albuterol 108 (90 Base)  MCG/ACT inhaler   apixaban 5 MG Tabs tablet Commonly known as: ELIQUIS Place 1 tablet (5 mg total) into feeding tube 2 (two) times daily. What changed:   medication strength  how much to take  how to take this  when to take this   chlorhexidine gluconate (MEDLINE KIT) 0.12 % solution Commonly known as: PERIDEX 15 mLs by Mouth Rinse route 2 (two) times daily.   cloNIDine 0.2 MG tablet Commonly known as: CATAPRES Place 1 tablet (0.2 mg total) into feeding tube 3 (three) times daily.   free water Soln Place 400 mLs into feeding tube every 4 (four) hours.   metoprolol tartrate 25 MG tablet Commonly known as: LOPRESSOR Place 1 tablet (25 mg total) into feeding tube 2 (two) times daily.   pantoprazole sodium 40 mg/20 mL Pack Commonly known as: PROTONIX Place 20 mLs (40 mg total) into feeding tube daily at 12 noon. Start taking on: November 13, 2020       Procedures/Studies: CT ABDOMEN WO CONTRAST  Result Date: 11/08/2020 CLINICAL DATA:  55 year old male  undergoing anatomic evaluation for possible percutaneous gastrostomy tube placement. EXAM: CT ABDOMEN WITHOUT CONTRAST TECHNIQUE: Multidetector CT imaging of the abdomen was performed following the standard protocol without IV contrast. COMPARISON:  Prior CT scan of the abdomen and pelvis 06/12/2018 FINDINGS: Lower chest: Small right and trace left pleural effusions. Small 0.4 cm nodule in the right middle lobe, unchanged compared to prior imaging dating back to 2019 and therefore benign. Combination of volume loss/atelectasis and consolidative changes present in both lower lobes. Cardiomegaly. No pericardial effusion. Calcifications present along the coronary arteries. Hepatobiliary: Nodular hepatic contour with relative hypertrophy of the left hepatic lobe. These findings are morphologically compatible with cirrhosis. No definite discrete mass within the limitations of noncontrast enhanced technique. Gallbladder is unremarkable. No  intra or extrahepatic biliary ductal dilatation. Pancreas: Unremarkable. No pancreatic ductal dilatation or surrounding inflammatory changes. Spleen: Normal in size without focal abnormality. Adrenals/Urinary Tract: Unremarkable adrenal glands. Nonspecific perinephric stranding bilaterally. No hydronephrosis or nephrolithiasis. Stomach/Bowel: Gastric tube terminates in the stomach. Normal anatomic position of the stomach without evidence of colonic interposition or other complicating feature. Colonic diverticular disease without CT evidence of active inflammation. Vascular/Lymphatic: Limited evaluation in the absence of intravenous contrast. Mild scattered atherosclerotic vascular calcifications. No aneurysm. No suspicious lymphadenopathy. Other: No evidence of ascites. Musculoskeletal: No acute fracture or aggressive appearing lytic or blastic osseous lesion. IMPRESSION: 1. Anatomy is suitable for percutaneous gastrostomy tube placement. 2. Small right and trace left pleural effusions with associated bibasilar atelectasis and consolidation which may reflect pneumonia or aspiration. 3. Cardiomegaly. 4. Coronary artery calcifications. 5. Hepatic cirrhosis. 6. Colonic diverticular disease without CT evidence of active inflammation. 7.  Aortic Atherosclerosis (ICD10-I70.0). Electronically Signed   By: Jacqulynn Cadet M.D.   On: 11/08/2020 15:46   IR GASTROSTOMY TUBE MOD SED  Result Date: 11/10/2020 INDICATION: 55 year old male with a history dysphagia EXAM: PERC PLACEMENT GASTROSTOMY MEDICATIONS: 2 g Ancef; Antibiotics were administered within 1 hour of the procedure. ANESTHESIA/SEDATION: Versed 1.5 mg IV; Fentanyl 100 mcg IV Moderate Sedation Time:  10 The patient was continuously monitored during the procedure by the interventional radiology nurse under my direct supervision. CONTRAST:  39m OMNIPAQUE IOHEXOL 300 MG/ML SOLN - administered into the gastric lumen. FLUOROSCOPY TIME:  Fluoroscopy Time: 2 minutes 36  seconds  ). COMPLICATIONS: None PROCEDURE: Informed written consent was obtained from the patient and the patient's family after a thorough discussion of the procedural risks, benefits and alternatives. All questions were addressed. Maximal Sterile Barrier Technique was utilized including caps, mask, sterile gowns, sterile gloves, sterile drape, hand hygiene and skin antiseptic. A timeout was performed prior to the initiation of the procedure. The epigastrium was prepped with Betadine in a sterile fashion, and a sterile drape was applied covering the operative field. A sterile gown and sterile gloves were used for the procedure. A 5-French orogastric tube is placed under fluoroscopic guidance. Scout imaging of the abdomen confirms barium within the transverse colon. The stomach was distended with gas. Under fluoroscopic guidance, an 18 gauge needle was utilized to puncture the anterior wall of the body of the stomach. An Amplatz wire was advanced through the needle passing a T fastener into the lumen of the stomach. The T fastener was secured for gastropexy. A 9-French sheath was inserted. A snare was advanced through the 9-French sheath. A BBritta Mccreedywas advanced through the orogastric tube. It was snared then pulled out the oral cavity, pulling the snare, as well. The leading edge of the gastrostomy was attached to the  snare. It was then pulled down the esophagus and out the percutaneous site. Tube secured in place. Contrast was injected. Patient tolerated the procedure well and remained hemodynamically stable throughout. No complications were encountered and no significant blood loss encountered. IMPRESSION: Status post fluoroscopic placed percutaneous gastrostomy tube, with 20 [place] pull-through. Signed, Dulcy Fanny. Earleen Newport, DO [facility] Electronically Signed   By: Corrie Mckusick D.O.   On: 11/10/2020 13:07   US RENAL  Result Date: 10/28/2020 CLINICAL  DATA:  Chronic kidney disease.  Acute kidney injury. EXAM: RENAL / URINARY TRACT ULTRASOUND COMPLETE COMPARISON:  CT 06/12/2018 FINDINGS: Right Kidney: Renal measurements: 11.0 x 6.4 x 5.2 cm = volume: 190 mL. Increased echogenicity. No focal lesion or hydronephrosis. Left Kidney: Renal measurements: 11.2 x 7.6 x 6.2 cm = volume: 276 mL. Increased echogenicity. No focal lesion or hydronephrosis. Bladder: Foley catheter within the bladder. Other: None. IMPRESSION: Slightly enlarged kidneys showing increased echogenicity. Findings could be due to chronic diabetic glomerulonephropathy or acute nephritis. No obstruction or focal lesion. Electronically Signed   By: Nelson Chimes M.D.   On: 10/28/2020 14:30   DG Chest Port 1 View  Result Date: 11/09/2020 CLINICAL DATA:  Respiratory failure EXAM: PORTABLE CHEST 1 VIEW COMPARISON:  11/05/2020 FINDINGS: Tracheostomy and nasogastric tube extending into the upper abdomen beyond the margin of the examination are again identified. Lung volumes are small and pulmonary insufflation has diminished since prior examination. Mild left basilar atelectasis or infiltrate. No pneumothorax or pleural effusion. Mild cardiomegaly is stable. Pulmonary vascularity is normal when accounting for poor pulmonary insufflation. No acute bone abnormality. IMPRESSION: Stable support tubes. Progressive pulmonary hypoinflation. Probable developing retrocardiac atelectasis. Stable cardiomegaly Electronically Signed   By: Fidela Salisbury MD   On: 11/09/2020 04:52   DG Chest Port 1 View  Result Date: 11/05/2020 CLINICAL DATA:  Respiratory failure EXAM: PORTABLE CHEST 1 VIEW COMPARISON:  11/02/2020 FINDINGS: Tracheostomy and NG tube are unchanged. Cardiomegaly. Mild vascular congestion. Bilateral airspace opacities again noted, worse at the left base. This is stable since prior study. No visible effusions or pneumothorax. IMPRESSION: Cardiomegaly. Bilateral airspace disease, most pronounced in the  left base, stable since prior study. Electronically Signed   By: Rolm Baptise M.D.   On: 11/05/2020 08:03   DG CHEST PORT 1 VIEW  Result Date: 11/02/2020 CLINICAL DATA:  NG tube placement. EXAM: PORTABLE CHEST 1 VIEW COMPARISON:  Radiograph yesterday. FINDINGS: The tip of the enteric tube is below the diaphragm, not included in the field of view. The side-port is not well visualized but likely below the diaphragm. Stable cardiomegaly. Patchy airspace opacities in both lung bases, unchanged. IMPRESSION: 1. Tip of the enteric tube below the diaphragm, not included in the field of view. 2. Unchanged patchy bibasilar airspace opacities. Electronically Signed   By: Keith Rake M.D.   On: 11/02/2020 21:51   DG CHEST PORT 1 VIEW  Result Date: 11/01/2020 CLINICAL DATA:  Acute on chronic respiratory failure. EXAM: PORTABLE CHEST 1 VIEW COMPARISON:  10/31/2020 FINDINGS: The tracheostomy tube is stable. The NG tube is stable. Much improved left lung aeration when compared to the prior study. The left upper lobe is now aerated. Persistent airspace process in the right lung and at the left lung base. No pleural effusions or pneumothorax. IMPRESSION: 1. Much improved left lung aeration when compared to the prior study. 2. Persistent right lung and left basilar airspace process. Electronically Signed   By: Ricky Stabs.D.  On: 11/01/2020 05:55   DG CHEST PORT 1 VIEW  Result Date: 10/31/2020 CLINICAL DATA:  Acute on chronic respiratory EXAM: PORTABLE CHEST 1 VIEW COMPARISON:  Failure four days ago FINDINGS: Enteric tube which reaches the stomach. Tracheostomy tube in place. Interval white out of the left chest with volume loss. Progressive hazy opacification of the right lower chest. Heart size is largely obscured. No visible air leak. IMPRESSION: 1. Interval white out of the left chest, likely plugging and pulmonary collapse. 2. Progressive airspace disease or pleural effusion on the right. Electronically  Signed   By: Monte Fantasia M.D.   On: 10/31/2020 04:22   DG Chest Port 1 View  Result Date: 10/27/2020 CLINICAL DATA:  Acute respiratory failure EXAM: PORTABLE CHEST 1 VIEW COMPARISON:  Yesterday FINDINGS: Enteric and tracheostomy tubes remain in good position. Cardiomegaly. Interstitial opacity which could be bronchitic or congestive. There is improved aeration at the right base. Negative for pneumothorax. IMPRESSION: 1. Improved aeration. 2. Bronchitic or congestive interstitial opacity. Electronically Signed   By: Monte Fantasia M.D.   On: 10/27/2020 07:46   DG Chest Port 1 View  Result Date: 10/26/2020 CLINICAL DATA:  Shortness of breath for 2-3 days EXAM: PORTABLE CHEST 1 VIEW COMPARISON:  Three days ago FINDINGS: Progressive hazy appearance of the right mid to lower chest. Dense retrocardiac opacity persists. An enteric tube and tracheostomy tube remain in place. Cardiomegaly and vascular pedicle widening. IMPRESSION: Pleural fluid/pulmonary opacification at the bases which has progressed from 3 days ago. Electronically Signed   By: Monte Fantasia M.D.   On: 10/26/2020 06:02   DG CHEST PORT 1 VIEW  Result Date: 10/23/2020 CLINICAL DATA:  Shortness of breath. EXAM: PORTABLE CHEST 1 VIEW COMPARISON:  October 22, 2020 FINDINGS: The near complete opacification of the left hemithorax seen on the comparison chest x-ray has resolved. Persistent bibasilar opacities are likely atelectasis. No pneumothorax. A tracheostomy tube is in good position. An NG tube terminates in the stomach. No other acute abnormalities. IMPRESSION: 1. Support apparatus as above. 2. Bibasilar atelectasis and a possible small left effusion. Improved aeration of the left lung. Electronically Signed   By: Dorise Bullion III M.D   On: 10/23/2020 14:57   DG Chest Port 1 View  Result Date: 10/22/2020 CLINICAL DATA:  Acute respiratory failure. EXAM: PORTABLE CHEST 1 VIEW COMPARISON:  10/20/2020 FINDINGS: Tracheostomy tube  overlies the airway. The cardiac silhouette is enlarged. There is persistent mild central pulmonary vascular congestion without overt edema. Airspace opacities in the left greater than right mid and lower lungs have slightly improved. Small pleural effusions are questioned. No pneumothorax is identified. IMPRESSION: Slight improvement of bilateral airspace opacities which could reflect pneumonia or atelectasis. Electronically Signed   By: [name] M.D.   On: 10/22/2020 09:16   DG Chest Port 1 View  Result Date: 10/20/2020 CLINICAL DATA:  Questionable sepsis EXAM: PORTABLE CHEST 1 VIEW COMPARISON:  Chest x-ray 06/12/2018 report without images. FINDINGS: Tracheostomy tube terminates approximately 7.5 cm above the carina. The heart size and mediastinal contours are not well visualized. Bilateral hilar vasculature prominence. Aortic arch calcifications. Silhouetting off of the left hemidiaphragm and left cardiac border. Partial silhouetting off the right cardiac border and right hemidiaphragm. Increased interstitial markings. Likely at least trace to small volume left pleural effusion. No pneumothorax. No acute osseous abnormality. IMPRESSION: 1. Left lung opacity and likely at least trace to small volume left pleural effusion with silhouetting off of the left diaphragm and heart border.  Concern for infection/inflammation. 2. Right lower lobe and middle lobe airspace opacity could represent a combination of atelectasis, infection, inflammation. 3. At least mild pulmonary edema. 4. Consider chest x-ray PA and lateral view for further evaluation. Electronically Signed   By: Iven Finn M.D.   On: 10/20/2020 12:26   DG Chest Port 1V same Day  Result Date: 10/22/2020 CLINICAL DATA:  Status post NG tube placement. EXAM: PORTABLE CHEST 1 VIEW COMPARISON:  Single-view of the chest earlier today. FINDINGS: The upper chest is off the margin of the film. NG tube is looped in the upper trachea or esophagus.  Visualized left chest is completely whited out on this study. IMPRESSION: NG tube is looped in the upper esophagus or trachea. Complete whiteout of the left chest is new since the study earlier today could be due to mucous plugging and left lung collapse or rapidly developing pleural effusion and airspace disease. Electronically Signed   By: [name] M.D.   On: 10/22/2020 15:16   DG Abd Portable 1V  Result Date: 10/22/2020 CLINICAL DATA:  Nasogastric tube placement EXAM: PORTABLE ABDOMEN - 1 VIEW COMPARISON:  Portable exam 1613 hours compared to 1523 hours FINDINGS: Tip of nasogastric tube projects over distal gastric antrum, consider withdrawal 5 cm. Bowel gas pattern normal. Opacification of the inferior LEFT hemithorax again seen. IMPRESSION: Tip of nasogastric tube projects over distal gastric antrum. Consider withdrawal of nasogastric tube 5 cm. Electronically Signed   By: [name] M.D.   On: 10/22/2020 16:36   DG Abd Portable 1V  Result Date: 10/22/2020 CLINICAL DATA:  Nasogastric tube placement EXAM: PORTABLE ABDOMEN - 1 VIEW COMPARISON:  None. FINDINGS: Nasogastric tube extends into the gastric cardia where it loops upon itself with the tip at the junction of the mid and distal thirds of the esophagus. There is no bowel dilatation or air-fluid level to suggest bowel obstruction. No free air. There is consolidation left lower lobe. IMPRESSION: Nasogastric tube extends into the stomach where it loops upon itself with the tip at the junction of the mid and distal thirds of the esophagus. No bowel obstruction or free air evident. Consolidation left lower lobe. These results will be called to the ordering clinician or representative by the Radiologist Assistant, and communication documented in the PACS or [employer]. Electronically Signed   By: Lowella Grip III M.D.   On: 10/22/2020 15:34   DG Swallowing Func-Speech Pathology  Result Date: 11/12/2020 Objective Swallowing  Evaluation: Type of Study: MBS-Modified Barium Swallow Study  Patient Details Name: Jack Cox MRN: [id number] Date of Birth: July 06, 1966 Today's Date: 11/12/2020 Time: SLP Start Time (ACUTE ONLY): 7017 -SLP Stop Time (ACUTE ONLY): 0959 SLP Time Calculation (min) (ACUTE ONLY): 32 min Past Medical History: Past Medical History: Diagnosis Date . CKD (chronic kidney disease)  . Congestive heart failure (CHF) ([place])  . Essential hypertension  . Fatigue  . Gout  . Hypercholesteremia  . Insomnia  . Proteinuria  . Renal insufficiency  . Shortness of breath  . Tracheostomy dependence [facility])  Past Surgical History: Past Surgical History: Procedure Laterality Date . IR GASTROSTOMY TUBE MOD SED  11/10/2020 HPI: 55 yo male never smoker with hx of OSA s/p tracheostomy 2001 and previously followed by Dr. Elsworth Soho presented on 10/20/20 with fever, cough with purulent sputum.  Found to have severe hypoxia and hypercapnia and required ventilatory support in ER. Pt's trach changed to new [place] cuffed #4 on 12/15, underwent PEG 11/10/20. He has a PMSV from  home. MD requested PMSV evaluation and swallow evaluation. Pt was eating regular foods and liquids prior to admission. MBSS recommended to determine LRD and safety to resume PO diet.  Subjective: "I had a button." Assessment / Plan / Recommendation CHL IP CLINICAL IMPRESSIONS 11/12/2020 Clinical Impression Pt presented with oropharyngeal swallowing to be grossly Within Normal Limits. No penetration or aspiration was visualized with any textures or consistencies assessed - assessed thin, puree, regular and administration of barium tablet with thin liquids; Pt demonstrated good hyolaryngeal excursion, adequate laryngeal vestibule closure and good airway protection. ST will f/u X1 to ensure diet tolerance, thank you. SLP Visit Diagnosis Aphonia (R49.1);Dysphagia, unspecified (R13.10) Attention and concentration deficit following -- Frontal lobe and executive function deficit following -- Impact  on safety and function Moderate aspiration risk   CHL IP TREATMENT RECOMMENDATION 11/12/2020 Treatment Recommendations Therapy as outlined in treatment plan below   Prognosis 11/12/2020 Prognosis for Safe Diet Advancement Good Barriers to Reach Goals -- Barriers/Prognosis Comment -- CHL IP DIET RECOMMENDATION 11/12/2020 SLP Diet Recommendations Dysphagia 3 (Mech soft) solids;Thin liquid Liquid Administration via Cup;Straw Medication Administration Whole meds with liquid Compensations Minimize environmental distractions;Slow rate;Small sips/bites;Follow solids with liquid Postural Changes Remain semi-upright after after feeds/meals (Comment);Seated upright at 90 degrees   CHL IP OTHER RECOMMENDATIONS 11/12/2020 Recommended Consults -- Oral Care Recommendations Oral care BID Other Recommendations Have oral suction available   CHL IP FOLLOW UP RECOMMENDATIONS 11/12/2020 Follow up Recommendations LTACH   CHL IP FREQUENCY AND DURATION 11/12/2020 Speech Therapy Frequency (ACUTE ONLY) min 1 x/week Treatment Duration 1 week      CHL IP ORAL PHASE 11/12/2020 Oral Phase WFL Oral - Pudding Teaspoon -- Oral - Pudding Cup -- Oral - Honey Teaspoon -- Oral - Honey Cup -- Oral - Nectar Teaspoon -- Oral - Nectar Cup -- Oral - Nectar Straw -- Oral - Thin Teaspoon -- Oral - Thin Cup -- Oral - Thin Straw -- Oral - Puree -- Oral - Mech Soft -- Oral - Regular -- Oral - Multi-Consistency -- Oral - Pill -- Oral Phase - Comment --  CHL IP PHARYNGEAL PHASE 11/12/2020 Pharyngeal Phase WFL Pharyngeal- Pudding Teaspoon -- Pharyngeal -- Pharyngeal- Pudding Cup -- Pharyngeal -- Pharyngeal- Honey Teaspoon -- Pharyngeal -- Pharyngeal- Honey Cup -- Pharyngeal -- Pharyngeal- Nectar Teaspoon -- Pharyngeal -- Pharyngeal- Nectar Cup -- Pharyngeal -- Pharyngeal- Nectar Straw -- Pharyngeal -- Pharyngeal- Thin Teaspoon -- Pharyngeal -- Pharyngeal- Thin Cup -- Pharyngeal -- Pharyngeal- Thin Straw -- Pharyngeal -- Pharyngeal- Puree -- Pharyngeal -- Pharyngeal- Mechanical  Soft -- Pharyngeal -- Pharyngeal- Regular -- Pharyngeal -- Pharyngeal- Multi-consistency -- Pharyngeal -- Pharyngeal- Pill -- Pharyngeal -- Pharyngeal Comment --  CHL IP CERVICAL ESOPHAGEAL PHASE 11/12/2020 Cervical Esophageal Phase WFL Pudding Teaspoon -- Pudding Cup -- Honey Teaspoon -- Honey Cup -- Nectar Teaspoon -- Nectar Cup -- Nectar Straw -- Thin Teaspoon -- Thin Cup -- Thin Straw -- Puree -- Mechanical Soft -- Regular -- Multi-consistency -- Pill -- Cervical Esophageal Comment -- Amelia H. Roddie Mc, CCC-SLP Speech Language Pathologist Wende Bushy 11/12/2020, 11:49 AM              ECHOCARDIOGRAM COMPLETE  Result Date: 10/21/2020    ECHOCARDIOGRAM REPORT   Patient Name:   Jack Cox Date of Exam: 10/21/2020 Medical Rec #:  [id number]        Height:       68.0 in Accession #:    [id number]       Weight:  325.8 lb Date of Birth:  1966/10/25        BSA:          2.515 m Patient Age:    75 years         BP:           112/61 mmHg Patient Gender: M                HR:           51 bpm. Exam Location:  [facility] Procedure: 2D Echo Indications:    CHF-Acute Diastolic V42.59  History:        Patient has no prior history of Echocardiogram examinations.                 CHF; Risk Factors:Non-Smoker. Acute on chronic respiratory                 failure with hypoxia.  Sonographer:    Leavy Cella RDCS (AE) Referring Phys: [phone] DAVID TAT IMPRESSIONS  1. Left ventricular ejection fraction, by estimation, is 60 to 65%. The left ventricle has normal function. The left ventricle has no regional wall motion abnormalities. There is severe left ventricular hypertrophy. Left ventricular diastolic parameters  are indeterminate. There is the interventricular septum is flattened in systole and diastole, consistent with right ventricular pressure and volume overload.  2. Right ventricular systolic function is mildly reduced. The right ventricular size is mildly enlarged. Tricuspid regurgitation signal is  inadequate for assessing PA pressure.  3. Right atrial size was upper normal.  4. The pericardial effusion is posterior to the left ventricle.  5. The mitral valve is grossly normal. Trivial mitral valve regurgitation.  6. The aortic valve is tricuspid. Aortic valve regurgitation is not visualized.  7. The inferior vena cava is normal in size with greater than 50% respiratory variability, suggesting right atrial pressure of 3 mmHg. FINDINGS  Left Ventricle: Left ventricular ejection fraction, by estimation, is 60 to 65%. The left ventricle has normal function. The left ventricle has no regional wall motion abnormalities. The left ventricular internal cavity size was normal in size. There is  severe left ventricular hypertrophy. The interventricular septum is flattened in systole and diastole, consistent with right ventricular pressure and volume overload. Left ventricular diastolic parameters are indeterminate. Right Ventricle: The right ventricular size is mildly enlarged. No increase in right ventricular wall thickness. Right ventricular systolic function is mildly reduced. Tricuspid regurgitation signal is inadequate for assessing PA pressure. Left Atrium: Left atrial size was normal in size. Right Atrium: Right atrial size was upper normal. Pericardium: Trivial pericardial effusion is present. The pericardial effusion is posterior to the left ventricle. Mitral Valve: The mitral valve is grossly normal. Trivial mitral valve regurgitation. Tricuspid Valve: The tricuspid valve is grossly normal. Tricuspid valve regurgitation is mild. Aortic Valve: The aortic valve is tricuspid. There is moderate aortic valve annular calcification. Aortic valve regurgitation is not visualized. Pulmonic Valve: The pulmonic valve was grossly normal. Pulmonic valve regurgitation is trivial. Aorta: The aortic root is normal in size and structure. Venous: The inferior vena cava is normal in size with greater than 50% respiratory  variability, suggesting right atrial pressure of 3 mmHg. IAS/Shunts: No atrial level shunt detected by color flow Doppler.  LEFT VENTRICLE PLAX 2D LVIDd:         4.66 cm  Diastology LVIDs:         2.03 cm  LV e' medial:    5.87 cm/s LV PW:  1.76 cm  LV E/e' medial:  14.0 LV IVS:        1.73 cm  LV e' lateral:   8.49 cm/s LVOT diam:     1.90 cm  LV E/e' lateral: 9.7 LVOT Area:     2.84 cm  RIGHT VENTRICLE RV S prime:     19.40 cm/s TAPSE (M-mode): 1.9 cm LEFT ATRIUM             Index       RIGHT ATRIUM           Index LA diam:        3.50 cm 1.39 cm/m  RA Area:     24.60 cm LA Vol (A2C):   43.9 ml 17.46 ml/m RA Volume:   82.30 ml  32.73 ml/m LA Vol (A4C):   43.8 ml 17.42 ml/m LA Biplane Vol: 44.4 ml 17.66 ml/m   AORTA Ao Root diam: 3.10 cm MITRAL VALVE MV Area (PHT): 2.64 cm    SHUNTS MV Decel Time: 287 msec    Systemic Diam: 1.90 cm MV E velocity: 82.30 cm/s MV A velocity: 59.10 cm/s MV E/A ratio:  1.39 Rozann Lesches MD Electronically signed by Rozann Lesches MD Signature Date/Time: 10/21/2020/4:33:43 PM    Final       Discharge Exam: Vitals:   11/12/20 1203 11/12/20 1515  BP:    Pulse: 63 64  Resp: 18 18  Temp:    SpO2: 94% 96%   Vitals:   11/12/20 0816 11/12/20 1157 11/12/20 1203 11/12/20 1515  BP:      Pulse: 65  63 64  Resp: (!) _0 Temp:  97.6 F (36.4 C)    TempSrc:  Oral    SpO2: 94%  94% 96%  Weight:      Height:        The results of significant diagnostics from this hospitalization (including imaging, microbiology, ancillary and laboratory) are listed below for reference.     Microbiology: Recent Results (from the past 240 hour(s))  Culture, respiratory (non-expectorated)     Status: None   Collection Time: 11/05/20 11:46 AM   Specimen: Tracheal Aspirate; Respiratory  Result Value Ref Range Status   Specimen Description   Final    TRACHEAL ASPIRATE Performed at [facility], [address] [place], [place] [address]    Special Requests   Final     NONE Performed at [facility], [address] [place], [place] [address]    Gram Stain   Final    MODERATE WBC PRESENT,BOTH PMN AND MONONUCLEAR FEW GRAM POSITIVE COCCI FEW GRAM NEGATIVE RODS RARE GRAM POSITIVE RODS    Culture   Final    FEW Normal respiratory flora-no Staph aureus or Pseudomonas seen Performed at [facility], [address] [place], [place] [address]    Report Status 11/08/2020 FINAL  Final     Labs: BNP (last 3 results) Recent Labs    10/22/20 0512 10/24/20 0402 11/06/20 0408  BNP 164.0* 205.0* 024.0*   Basic Metabolic Panel: Recent Labs  Lab 11/08/20 0246 11/09/20 0422 11/10/20 0458 11/11/20 0536 11/12/20 0407  NA 137 137 138 141 142  K 4.1 4.0 3.8 4.0 3.9  CL 101 100 100 103 104  CO2 _1 GLUCOSE 127* 112* 95 95 114*  BUN 91* 85* 82* 80* 79*  CREATININE 1.84* 1.72* 1.78* 1.99* 1.77*  CALCIUM 10.2 10.7* 10.6* 10.8* 10.8*  PHOS 4.6  4.5 5.3* 5.3* 4.3   Liver Function Tests: Recent Labs  Lab 11/08/20 0246 11/09/20 0422 11/10/20 0458 11/11/20 0536 11/12/20 0407  ALBUMIN 2.4* 2.5* 2.4* 2.6* 2.5*   No results for input(s): LIPASE, AMYLASE in the last 168 hours. No results for input(s): AMMONIA in the last 168 hours. CBC: Recent Labs  Lab 11/08/20 0246 11/09/20 0422 11/10/20 0458 11/11/20 0536 11/12/20 0407  WBC 4.9 4.8 4.9 5.3 5.1  HGB 12.0* 11.4* 11.2* 11.7* 11.6*  HCT 40.1 37.9* 38.2* 40.2 39.9  MCV 98.3 97.9 99.2 100.2* 100.3*  PLT 255 305 304 286 282   Cardiac Enzymes: No results for input(s): CKTOTAL, CKMB, CKMBINDEX, TROPONINI in the last 168 hours. BNP: Invalid input(s): POCBNP CBG: Recent Labs  Lab 11/11/20 1558 11/11/20 2013 11/12/20 0457 11/12/20 0750 11/12/20 1200  GLUCAP 98 90 116* 128* 98   D-Dimer No results for input(s): DDIMER in the last 72 hours. Hgb A1c No results for input(s): HGBA1C in the last 72 hours. Lipid Profile No results for input(s): CHOL, HDL, LDLCALC, TRIG,  CHOLHDL, LDLDIRECT in the last 72 hours. Thyroid function studies No results for input(s): TSH, T4TOTAL, T3FREE, THYROIDAB in the last 72 hours.  Invalid input(s): FREET3 Anemia work up No results for input(s): VITAMINB12, FOLATE, FERRITIN, TIBC, IRON, RETICCTPCT in the last 72 hours. Urinalysis    Component Value Date/Time   COLORURINE YELLOW 10/28/2020 0429   APPEARANCEUR HAZY (A) 10/28/2020 0429   LABSPEC 1.014 10/28/2020 0429   PHURINE 5.0 10/28/2020 0429   GLUCOSEU NEGATIVE 10/28/2020 0429   HGBUR SMALL (A) 10/28/2020 0429   BILIRUBINUR NEGATIVE 10/28/2020 0429   KETONESUR NEGATIVE 10/28/2020 0429   PROTEINUR 100 (A) 10/28/2020 0429   NITRITE NEGATIVE 10/28/2020 0429   LEUKOCYTESUR NEGATIVE 10/28/2020 0429   Sepsis Labs Invalid input(s): PROCALCITONIN,  WBC,  LACTICIDVEN Microbiology Recent Results (from the past 240 hour(s))  Culture, respiratory (non-expectorated)     Status: None   Collection Time: 11/05/20 11:46 AM   Specimen: Tracheal Aspirate; Respiratory  Result Value Ref Range Status   Specimen Description   Final    TRACHEAL ASPIRATE Performed at [facility], [address] [place], [place] [address]    Special Requests   Final    NONE Performed at [facility], [address] [place], [place] [address]    Gram Stain   Final    MODERATE WBC PRESENT,BOTH PMN AND MONONUCLEAR FEW GRAM POSITIVE COCCI FEW GRAM NEGATIVE RODS RARE GRAM POSITIVE RODS    Culture   Final    FEW Normal respiratory flora-no Staph aureus or Pseudomonas seen Performed at [facility], [address], [place], [place] [address]    Report Status 11/08/2020 FINAL  Final    Time coordinating discharge: 38 mins  SIGNED:  Irwin Brakeman, MD  [facility] 11/12/2020, 3:36 PM How to contact the [facility] Attending or Consulting provider [place] or covering provider during after hours [place], for this patient?  1. Check the care team in [facility] and look for a) attending/consulting  TRH provider listed and b) the [facility] team listed 2. Log into [email or web address] and use [place]'s universal password to access. If you do not have the password, please contact the hospital operator. 3. Locate the [facility] provider you are looking for under [facility] and page to a number that you can be directly reached. 4. If you still have difficulty reaching the provider, please page the [facility] (Director on Call) for the Hospitalists listed  on amion for assistance.

## 2020-11-12 NOTE — TOC Transition Note (Addendum)
Transition of Care [facility]) - CM/SW Discharge Note   Patient Details  Name: Jack Cox MRN: [id number] Date of Birth: September 27, 1966  [facility] CM/SW Contact:  Shade Flood, LCSW Phone Number: 11/12/2020, 3:43 PM   Clinical Narrative:     TOC following. [facility] has received insurance authorization for pt to transfer. Updated MD who states pt is stable to transfer. RN updated pt and his wife and they remain in agreement with plan for transfer.   [facility] team can obtain dc clinical electronically. RN to call report. EMS arranged.   Pt going to room 401. Dr. Sheppard Coil is the accepting MD.   There are no other TOC needs identified for dc.  Final next level of care: Long Term Acute Care (LTAC) Barriers to Discharge: Barriers Resolved   Patient Goals and CMS Choice Patient states their goals for this hospitalization and ongoing recovery are:: Get stronger CMS Medicare.gov Compare Post Acute Care list provided to:: Patient Represenative (must comment) [name] (wife)) Choice offered to / list presented to : Spouse  Discharge Placement                Patient to be transferred to facility by: EMS Name of family member notified: [name] Patient and family notified of of transfer: 11/12/20  Discharge Plan and Services In-house Referral: Clinical Social Work Discharge Planning Services: NA            DME Arranged: N/A DME Agency: NA       HH Arranged: NA HH Agency: NA        Social Determinants of Health (SDOH) Interventions     Readmission Risk Interventions Readmission Risk Prevention Plan 11/12/2020  Transportation Screening Complete  Home Care Screening Complete  Medication Review (RN CM) Complete  Some recent data might be hidden

## 2020-11-12 NOTE — Progress Notes (Signed)
PROGRESS NOTE  Jack Cox BWL:[id number] DOB: 15-Sep-1966 DOA: 10/20/2020 PCP: Jack Blitz, MD   Brief History: 55 y.o.malewith medical history ofdiastolic CHF, atrial flutter, obstructive sleep apnea, hypertension, right bundle branch block presenting with shortness of breath for the last 2 to 3 days. Unfortunately, the patient is encephalopathic at the time of my evaluation; therefore, history is limited. Apparently, the patient went to see his nephrologist earlier on 10/20/2020. He was noted to have oxygen saturation in the 60s. As result, he was sent to the emergency department for further evaluation. Initially, the patient was awake and conversant with oxygen saturation in the low 70s. He was placed on a nonrebreather after which the patient became obtunded. VBG showed pH 7.1 66/97/106 on 80%. Subsequently, the patient was placed on the ventilator with PRVC,PEEP of 5, FiO2 70%. After approximately 10 to 15 minutes on the ventilator, the patient remained somnolent, but awakened and was able to tell me his name. He did state that he has not been taking his torsemide as directed. In addition, the patient has been sleeping in a recliner for an unclear duration of time secondary to shortness of breath. At baseline, the patient has a size 4 noncuffed tracheostomy. He is on supplemental oxygen 2.5 L during the day and 4 L at nighttime.  There is no reports of nausea, vomiting, diarrhea, chest pain, abdominal pain.  In the emergency department, the patient was afebrile and hemodynamically stable with oxygen saturation 93-94% on the ventilator with FiO2 70%. Chest x-ray showed left-sided pleural effusion with increased interstitial markings. BNP 790. PCT 0.12. BMP showed a serum creatinine 2.43. LFTs were unremarkable. WBC 8.1, hemoglobin 14.5, platelets 253,000. The patient was given ceftriaxone and azithromycin. He was also given furosemide 80 mg IV x1. COVID-19  RT-PCR negative.  He was continued on IV lasix 80 mg bid with uptrending of his serum creatinine.  Renal was consulted to assist.   Assessment/Plan: Acute on chronic respiratory failure with hypoxia and hypercarbia -Secondary to CHF in the setting of OSA -having mucus plugging--needs aggressive suctioning -12/17 and 12/26 CXR--white out of left lung likely mucus plugging -continue mucomyst and albuterol -10/26/20-bronchoscopy--Thick inspissated mucus in proximal airways & BL lower lobes, removed with suctioning - hypertonic saline nebulizer treatments started by pulmonology 12/22 FiO2 (%):  [40 %] 40 %  Acute on chronic diastolic CHF/Cor Pulmonale -06/25/2019 echo EF 55%, trivial TR -12/16/21echo--EF 60-65%, no WMA, RV pressure overload, mild TR -he has been diuresing well with no diuretic support -Following BMP -Accurate I's and O's--NEG 13L -Daily weights I/O last 3 completed shifts: In: 230 [Other:230] Out: 2250 [Urine:2250] Total I/O In: 0  Out: 900 [Urine:900]  Atrial flutter - controlled -apixaban restarted 1/6 and he is off IV heparin infusion -rate controlledbut had an episode 3-4 hours 12/18-12/19 evening -controlled on low dose metoprolol 25 mg BID per tube   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    Fever - RESOLVED -blood cultures x 2--neg to date -UA and urine culture--no significant pyuria -12/16 trach aspirate-->flora -10/26/20-->BAL sent for culture -completed course of cefepime  Essential hypertension -metoprolol low dose with holding parameters -BPs elevated, add PRN coverage as needed  OSA -The patient has a tracheostomy placed in 2001 -He has a size 4 cuffless  trach  Right bundle branch block -This has been chronic with review of the medical  records  Morbid obesity -BMI 52.94 -lifestyle modification  GOC -palliative following -currently full scope of care  Status is: Inpatient  Remains inpatient appropriate because:IV treatments appropriate due to intensity of illness or inability to take PO  Dispo: The patient is from:Home Anticipated d/c is WU:XLKG Anticipated d/c date is: 1 days Patient currently is medically stable to d/c to LTAC. (STILL WAITING FOR INSURANCE AUTH FOR LTAC)   Family Communication:updates at bedside  Consultants:pulm, renal  Code Status: FULL  DVT Prophylaxis: IV heparin infusion for full anticoagulation   Procedures: As Listed in Progress Note Above  Antibiotics: Cefepime 12/21>>12/27  Subjective: Patient reports that he wants to eat and drink.  He did well with swallow evaluation today.  He has tenderness in abdomen from PEG placement.   Objective: Vitals:   11/12/20 0800 11/12/20 0816 11/12/20 1157 11/12/20 1203  BP: (!) 197/92     Pulse: (!) 58 65  63  Resp: (!) 24 (!) 22  18  Temp:   97.6 F (36.4 C)   TempSrc:   Oral   SpO2: 93% 94%  94%  Weight:      Height:        Intake/Output Summary (Last 24 hours) at 11/12/2020 1405 Last data filed at 11/12/2020 0956 Gross per 24 hour  Intake 130 ml  Output 3150 ml  Net -3020 ml   Weight change:  Exam:  General - He is awake and alert, no apparent distress.   Neck - trach collar in place.  Lungs: BBS clear to ausculation. No increased WOB.  CV - normal s1,s2 sounds. No MRG.  Abd - obese, soft, no masses palpated.   Ext - trace pretibial edema BLEs.  Neuro - nonfocal exam.   Data Reviewed: I have personally reviewed following labs and imaging studies  Basic Metabolic Panel: Recent Labs  Lab 11/08/20 0246 11/09/20 0422 11/10/20 0458 11/11/20 0536 11/12/20 0407  NA 137 137 138 141 142  K 4.1 4.0 3.8 4.0 3.9  CL 101 100 100 103 104  CO2 _0 GLUCOSE 127* 112* 95 95 114*  BUN 91* 85* 82* 80* 79*  CREATININE 1.84* 1.72* 1.78* 1.99* 1.77*  CALCIUM 10.2 10.7* 10.6* 10.8* 10.8*  PHOS 4.6 4.5 5.3* 5.3* 4.3   Liver Function Tests: Recent Labs  Lab 11/08/20 0246 11/09/20 0422 11/10/20 0458 11/11/20 0536 11/12/20 0407  ALBUMIN 2.4* 2.5* 2.4* 2.6* 2.5*   No results for input(s): LIPASE, AMYLASE in the last 168 hours. No results for input(s): AMMONIA in the last 168 hours. Coagulation Profile: Recent Labs  Lab 11/09/20 1356  INR 1.2   CBC: Recent Labs  Lab 11/08/20 0246 11/09/20 0422 11/10/20 0458 11/11/20 0536 11/12/20 0407  WBC 4.9 4.8 4.9 5.3 5.1  HGB 12.0* 11.4* 11.2* 11.7* 11.6*  HCT 40.1 37.9* 38.2* 40.2 39.9  MCV 98.3 97.9 99.2 100.2* 100.3*  PLT 255 305 304 286 282   Cardiac Enzymes: No results for input(s): CKTOTAL, CKMB, CKMBINDEX, TROPONINI in the last 168 hours. BNP: Invalid input(s): POCBNP CBG: Recent Labs  Lab 11/11/20 1558 11/11/20 2013 11/12/20 0457 11/12/20 0750 11/12/20 1200  GLUCAP 98 90 116* 128* 98   HbA1C: No results for input(s): HGBA1C in the last 72 hours. Urine analysis:    Component Value Date/Time   COLORURINE YELLOW 10/28/2020 0429   APPEARANCEUR HAZY (A) 10/28/2020 4010  LABSPEC 1.014 10/28/2020 0429   PHURINE 5.0 10/28/2020 0429   GLUCOSEU NEGATIVE 10/28/2020 0429   HGBUR SMALL (A) 10/28/2020 0429   BILIRUBINUR NEGATIVE 10/28/2020 0429   KETONESUR NEGATIVE 10/28/2020 0429   PROTEINUR 100 (A) 10/28/2020 0429   NITRITE NEGATIVE 10/28/2020 0429   LEUKOCYTESUR NEGATIVE 10/28/2020 0429    Recent Results (from the past 240 hour(s))  Culture, respiratory (non-expectorated)     Status: None   Collection Time: 11/05/20 11:46 AM   Specimen: Tracheal Aspirate; Respiratory  Result Value Ref Range Status   Specimen Description   Final    TRACHEAL ASPIRATE Performed at [facility], [address] [place], [place] [address]    Special Requests   Final     NONE Performed at [facility], [address] [place], [place] [address]    Gram Stain   Final    MODERATE WBC PRESENT,BOTH PMN AND MONONUCLEAR FEW GRAM POSITIVE COCCI FEW GRAM NEGATIVE RODS RARE GRAM POSITIVE RODS    Culture   Final    FEW Normal respiratory flora-no Staph aureus or Pseudomonas seen Performed at [facility], [address], [place], [place] [address]    Report Status 11/08/2020 FINAL  Final     Scheduled Meds: . albuterol  2.5 mg Nebulization BID  . apixaban  5 mg Per Tube BID  . chlorhexidine gluconate (MEDLINE KIT)  15 mL Mouth Rinse BID  . Chlorhexidine Gluconate Cloth  6 each Topical Daily  . cloNIDine  0.2 mg Per Tube TID  . feeding supplement (PROSource TF)  90 mL Per Tube TID  . free water  400 mL Per Tube Q4H  . mouth rinse  15 mL Mouth Rinse 2 times per day  . metoprolol tartrate  25 mg Per Tube BID  . neomycin-bacitracin-polymyxin  1 application Topical Daily  . pantoprazole sodium  40 mg Per Tube Q1200  . sodium chloride flush  3 mL Intravenous Q12H   Continuous Infusions: . sodium chloride    . sodium chloride 250 mL (11/02/20 1546)  . dexmedetomidine (PRECEDEX) IV infusion 0.9 mcg/kg/hr (11/12/20 0711)  . feeding supplement (VITAL HIGH PROTEIN) 1,000 mL (11/11/20 0754)   Procedures/Studies: CT ABDOMEN WO CONTRAST  Result Date: 11/08/2020 CLINICAL DATA:  55 year old male undergoing anatomic evaluation for possible percutaneous gastrostomy tube placement. EXAM: CT ABDOMEN WITHOUT CONTRAST TECHNIQUE: Multidetector CT imaging of the abdomen was performed following the standard protocol without IV contrast. COMPARISON:  Prior CT scan of the abdomen and pelvis 06/12/2018 FINDINGS: Lower chest: Small right and trace left pleural effusions. Small 0.4 cm nodule in the right middle lobe, unchanged compared to prior imaging dating back to 2019 and therefore benign. Combination of volume loss/atelectasis and consolidative changes present in both lower  lobes. Cardiomegaly. No pericardial effusion. Calcifications present along the coronary arteries. Hepatobiliary: Nodular hepatic contour with relative hypertrophy of the left hepatic lobe. These findings are morphologically compatible with cirrhosis. No definite discrete mass within the limitations of noncontrast enhanced technique. Gallbladder is unremarkable. No intra or extrahepatic biliary ductal dilatation. Pancreas: Unremarkable. No pancreatic ductal dilatation or surrounding inflammatory changes. Spleen: Normal in size without focal abnormality. Adrenals/Urinary Tract: Unremarkable adrenal glands. Nonspecific perinephric stranding bilaterally. No hydronephrosis or nephrolithiasis. Stomach/Bowel: Gastric tube terminates in the stomach. Normal anatomic position of the stomach without evidence of colonic interposition or other complicating feature. Colonic diverticular disease without CT evidence of active inflammation. Vascular/Lymphatic: Limited evaluation in the absence of intravenous contrast. Mild scattered atherosclerotic vascular calcifications.  No aneurysm. No suspicious lymphadenopathy. Other: No evidence of ascites. Musculoskeletal: No acute fracture or aggressive appearing lytic or blastic osseous lesion. IMPRESSION: 1. Anatomy is suitable for percutaneous gastrostomy tube placement. 2. Small right and trace left pleural effusions with associated bibasilar atelectasis and consolidation which may reflect pneumonia or aspiration. 3. Cardiomegaly. 4. Coronary artery calcifications. 5. Hepatic cirrhosis. 6. Colonic diverticular disease without CT evidence of active inflammation. 7.  Aortic Atherosclerosis (ICD10-I70.0). Electronically Signed   By: Jacqulynn Cadet M.D.   On: 11/08/2020 15:46   IR GASTROSTOMY TUBE MOD SED  Result Date: 11/10/2020 INDICATION: 55 year old male with a history dysphagia EXAM: PERC PLACEMENT GASTROSTOMY MEDICATIONS: 2 g Ancef; Antibiotics were administered within 1 hour of  the procedure. ANESTHESIA/SEDATION: Versed 1.5 mg IV; Fentanyl 100 mcg IV Moderate Sedation Time:  10 The patient was continuously monitored during the procedure by the interventional radiology nurse under my direct supervision. CONTRAST:  44m OMNIPAQUE IOHEXOL 300 MG/ML SOLN - administered into the gastric lumen. FLUOROSCOPY TIME:  Fluoroscopy Time: 2 minutes 36 seconds  ). COMPLICATIONS: None PROCEDURE: Informed written consent was obtained from the patient and the patient's family after a thorough discussion of the procedural risks, benefits and alternatives. All questions were addressed. Maximal Sterile Barrier Technique was utilized including caps, mask, sterile gowns, sterile gloves, sterile drape, hand hygiene and skin antiseptic. A timeout was performed prior to the initiation of the procedure. The epigastrium was prepped with Betadine in a sterile fashion, and a sterile drape was applied covering the operative field. A sterile gown and sterile gloves were used for the procedure. A 5-French orogastric tube is placed under fluoroscopic guidance. Scout imaging of the abdomen confirms barium within the transverse colon. The stomach was distended with gas. Under fluoroscopic guidance, an 18 gauge needle was utilized to puncture the anterior wall of the body of the stomach. An Amplatz wire was advanced through the needle passing a T fastener into the lumen of the stomach. The T fastener was secured for gastropexy. A 9-French sheath was inserted. A snare was advanced through the 9-French sheath. A BBritta Mccreedywas advanced through the orogastric tube. It was snared then pulled out the oral cavity, pulling the snare, as well. The leading edge of the gastrostomy was attached to the snare. It was then pulled down the esophagus and out the percutaneous site. Tube secured in place. Contrast was injected. Patient tolerated the procedure well and remained hemodynamically stable throughout. No complications were encountered  and no significant blood loss encountered. IMPRESSION: Status post fluoroscopic placed percutaneous gastrostomy tube, with 20 [place]. Signed, JDulcy Fanny WEarleen Newport DO [facility] Electronically Signed   By: JCorrie MckusickD.O.   On: 11/10/2020 13:07   [place]RENAL  Result Date: 10/28/2020 CLINICAL DATA:  Chronic kidney disease.  Acute kidney injury. EXAM: RENAL / URINARY TRACT ULTRASOUND COMPLETE COMPARISON:  CT 06/12/2018 FINDINGS: Right Kidney: Renal measurements: 11.0 x 6.4 x 5.2 cm = volume: 190 mL. Increased echogenicity. No focal lesion or hydronephrosis. Left Kidney: Renal measurements: 11.2 x 7.6 x 6.2 cm = volume: 276 mL. Increased echogenicity. No focal lesion or hydronephrosis. Bladder: Foley catheter within the bladder. Other: None. IMPRESSION: Slightly enlarged kidneys showing increased echogenicity. Findings could be due to chronic diabetic glomerulonephropathy or acute nephritis. No obstruction or focal lesion. Electronically Signed   By: MNelson ChimesM.D.   On: 10/28/2020 14:30   DG Chest Port 1 View  Result Date: 11/09/2020 CLINICAL DATA:  Respiratory failure EXAM: PORTABLE CHEST 1 VIEW COMPARISON:  11/05/2020 FINDINGS: Tracheostomy and nasogastric tube extending into the upper abdomen beyond the margin of the examination are again identified. Lung volumes are small and pulmonary insufflation has diminished since prior examination. Mild left basilar atelectasis or infiltrate. No pneumothorax or pleural effusion. Mild cardiomegaly is stable. Pulmonary vascularity is normal when accounting for poor pulmonary insufflation. No acute bone abnormality. IMPRESSION: Stable support tubes. Progressive pulmonary hypoinflation. Probable developing retrocardiac atelectasis. Stable cardiomegaly Electronically Signed   By: Fidela Salisbury MD   On: 11/09/2020 04:52   DG Chest Port 1 View  Result Date: 11/05/2020 CLINICAL DATA:  Respiratory  failure EXAM: PORTABLE CHEST 1 VIEW COMPARISON:  11/02/2020 FINDINGS: Tracheostomy and NG tube are unchanged. Cardiomegaly. Mild vascular congestion. Bilateral airspace opacities again noted, worse at the left base. This is stable since prior study. No visible effusions or pneumothorax. IMPRESSION: Cardiomegaly. Bilateral airspace disease, most pronounced in the left base, stable since prior study. Electronically Signed   By: Rolm Baptise M.D.   On: 11/05/2020 08:03   DG CHEST PORT 1 VIEW  Result Date: 11/02/2020 CLINICAL DATA:  NG tube placement. EXAM: PORTABLE CHEST 1 VIEW COMPARISON:  Radiograph yesterday. FINDINGS: The tip of the enteric tube is below the diaphragm, not included in the field of view. The side-port is not well visualized but likely below the diaphragm. Stable cardiomegaly. Patchy airspace opacities in both lung bases, unchanged. IMPRESSION: 1. Tip of the enteric tube below the diaphragm, not included in the field of view. 2. Unchanged patchy bibasilar airspace opacities. Electronically Signed   By: Keith Rake M.D.   On: 11/02/2020 21:51   DG CHEST PORT 1 VIEW  Result Date: 11/01/2020 CLINICAL DATA:  Acute on chronic respiratory failure. EXAM: PORTABLE CHEST 1 VIEW COMPARISON:  10/31/2020 FINDINGS: The tracheostomy tube is stable. The NG tube is stable. Much improved left lung aeration when compared to the prior study. The left upper lobe is now aerated. Persistent airspace process in the right lung and at the left lung base. No pleural effusions or pneumothorax. IMPRESSION: 1. Much improved left lung aeration when compared to the prior study. 2. Persistent right lung and left basilar airspace process. Electronically Signed   By: Marijo Sanes M.D.   On: 11/01/2020 05:55   DG CHEST PORT 1 VIEW  Result Date: 10/31/2020 CLINICAL DATA:  Acute on chronic respiratory EXAM: PORTABLE CHEST 1 VIEW COMPARISON:  Failure four days ago FINDINGS: Enteric tube which reaches the stomach.  Tracheostomy tube in place. Interval white out of the left chest with volume loss. Progressive hazy opacification of the right lower chest. Heart size is largely obscured. No visible air leak. IMPRESSION: 1. Interval white out of the left chest, likely plugging and pulmonary collapse. 2. Progressive airspace disease or pleural effusion on the right. Electronically Signed   By: Monte Fantasia M.D.   On: 10/31/2020 04:22   DG Chest Port 1 View  Result Date: 10/27/2020 CLINICAL DATA:  Acute respiratory failure EXAM: PORTABLE CHEST 1 VIEW COMPARISON:  Yesterday FINDINGS: Enteric and tracheostomy tubes remain in good position. Cardiomegaly. Interstitial opacity which could be bronchitic or congestive. There is improved aeration at the right base. Negative for pneumothorax. IMPRESSION: 1. Improved aeration. 2. Bronchitic or congestive interstitial opacity. Electronically Signed   By: Monte Fantasia M.D.   On: 10/27/2020 07:46   DG Chest Port 1 View  Result Date: 10/26/2020 CLINICAL DATA:  Shortness of breath for 2-3 days EXAM:  PORTABLE CHEST 1 VIEW COMPARISON:  Three days ago FINDINGS: Progressive hazy appearance of the right mid to lower chest. Dense retrocardiac opacity persists. An enteric tube and tracheostomy tube remain in place. Cardiomegaly and vascular pedicle widening. IMPRESSION: Pleural fluid/pulmonary opacification at the bases which has progressed from 3 days ago. Electronically Signed   By: Monte Fantasia M.D.   On: 10/26/2020 06:02   DG CHEST PORT 1 VIEW  Result Date: 10/23/2020 CLINICAL DATA:  Shortness of breath. EXAM: PORTABLE CHEST 1 VIEW COMPARISON:  October 22, 2020 FINDINGS: The near complete opacification of the left hemithorax seen on the comparison chest x-ray has resolved. Persistent bibasilar opacities are likely atelectasis. No pneumothorax. A tracheostomy tube is in good position. An NG tube terminates in the stomach. No other acute abnormalities. IMPRESSION: 1. Support  apparatus as above. 2. Bibasilar atelectasis and a possible small left effusion. Improved aeration of the left lung. Electronically Signed   By: Dorise Bullion III M.D   On: 10/23/2020 14:57   DG Chest Port 1 View  Result Date: 10/22/2020 CLINICAL DATA:  Acute respiratory failure. EXAM: PORTABLE CHEST 1 VIEW COMPARISON:  10/20/2020 FINDINGS: Tracheostomy tube overlies the airway. The cardiac silhouette is enlarged. There is persistent mild central pulmonary vascular congestion without overt edema. Airspace opacities in the left greater than right mid and lower lungs have slightly improved. Small pleural effusions are questioned. No pneumothorax is identified. IMPRESSION: Slight improvement of bilateral airspace opacities which could reflect pneumonia or atelectasis. Electronically Signed   By: [name] M.D.   On: 10/22/2020 09:16   DG Chest Port 1 View  Result Date: 10/20/2020 CLINICAL DATA:  Questionable sepsis EXAM: PORTABLE CHEST 1 VIEW COMPARISON:  Chest x-ray 06/12/2018 report without images. FINDINGS: Tracheostomy tube terminates approximately 7.5 cm above the carina. The heart size and mediastinal contours are not well visualized. Bilateral hilar vasculature prominence. Aortic arch calcifications. Silhouetting off of the left hemidiaphragm and left cardiac border. Partial silhouetting off the right cardiac border and right hemidiaphragm. Increased interstitial markings. Likely at least trace to small volume left pleural effusion. No pneumothorax. No acute osseous abnormality. IMPRESSION: 1. Left lung opacity and likely at least trace to small volume left pleural effusion with silhouetting off of the left diaphragm and heart border. Concern for infection/inflammation. 2. Right lower lobe and middle lobe airspace opacity could represent a combination of atelectasis, infection, inflammation. 3. At least mild pulmonary edema. 4. Consider chest x-ray PA and lateral view for further evaluation.  Electronically Signed   By: Iven Finn M.D.   On: 10/20/2020 12:26   DG Chest Port 1V same Day  Result Date: 10/22/2020 CLINICAL DATA:  Status post NG tube placement. EXAM: PORTABLE CHEST 1 VIEW COMPARISON:  Single-view of the chest earlier today. FINDINGS: The upper chest is off the margin of the film. NG tube is looped in the upper trachea or esophagus. Visualized left chest is completely whited out on this study. IMPRESSION: NG tube is looped in the upper esophagus or trachea. Complete whiteout of the left chest is new since the study earlier today could be due to mucous plugging and left lung collapse or rapidly developing pleural effusion and airspace disease. Electronically Signed   By: [name] M.D.   On: 10/22/2020 15:16   DG Abd Portable 1V  Result Date: 10/22/2020 CLINICAL DATA:  Nasogastric tube placement EXAM: PORTABLE ABDOMEN - 1 VIEW COMPARISON:  Portable exam 1613 hours compared to 1523 hours FINDINGS: Tip of nasogastric  tube projects over distal gastric antrum, consider withdrawal 5 cm. Bowel gas pattern normal. Opacification of the inferior LEFT hemithorax again seen. IMPRESSION: Tip of nasogastric tube projects over distal gastric antrum. Consider withdrawal of nasogastric tube 5 cm. Electronically Signed   By: [name] M.D.   On: 10/22/2020 16:36   DG Abd Portable 1V  Result Date: 10/22/2020 CLINICAL DATA:  Nasogastric tube placement EXAM: PORTABLE ABDOMEN - 1 VIEW COMPARISON:  None. FINDINGS: Nasogastric tube extends into the gastric cardia where it loops upon itself with the tip at the junction of the mid and distal thirds of the esophagus. There is no bowel dilatation or air-fluid level to suggest bowel obstruction. No free air. There is consolidation left lower lobe. IMPRESSION: Nasogastric tube extends into the stomach where it loops upon itself with the tip at the junction of the mid and distal thirds of the esophagus. No bowel obstruction or free air evident.  Consolidation left lower lobe. These results will be called to the ordering clinician or representative by the Radiologist Assistant, and communication documented in the PACS or [employer]. Electronically Signed   By: Lowella Grip III M.D.   On: 10/22/2020 15:34   DG Swallowing Func-Speech Pathology  Result Date: 11/12/2020 Objective Swallowing Evaluation: Type of Study: MBS-Modified Barium Swallow Study  Patient Details Name: Jack Cox MRN: [id number] Date of Birth: October 14, 1966 Today's Date: 11/12/2020 Time: SLP Start Time (ACUTE ONLY): 5993 -SLP Stop Time (ACUTE ONLY): 0959 SLP Time Calculation (min) (ACUTE ONLY): 32 min Past Medical History: Past Medical History: Diagnosis Date . CKD (chronic kidney disease)  . Congestive heart failure (CHF) ([place])  . Essential hypertension  . Fatigue  . Gout  . Hypercholesteremia  . Insomnia  . Proteinuria  . Renal insufficiency  . Shortness of breath  . Tracheostomy dependence [facility] [facility]  Past Surgical History: Past Surgical History: Procedure Laterality Date . IR GASTROSTOMY TUBE MOD SED  11/10/2020 HPI: 55 yo male never smoker with hx of OSA s/p tracheostomy 2001 and previously followed by Dr. Elsworth Soho presented on 10/20/20 with fever, cough with purulent sputum.  Found to have severe hypoxia and hypercapnia and required ventilatory support in ER. Pt's trach changed to new Shiley cuffed #4 on 12/15, underwent PEG 11/10/20. He has a PMSV from home. MD requested PMSV evaluation and swallow evaluation. Pt was eating regular foods and liquids prior to admission. MBSS recommended to determine LRD and safety to resume PO diet.  Subjective: "I had a button." Assessment / Plan / Recommendation CHL IP CLINICAL IMPRESSIONS 11/12/2020 Clinical Impression Pt presented with oropharyngeal swallowing to be grossly Within Normal Limits. No penetration or aspiration was visualized with any textures or consistencies assessed - assessed thin, puree, regular and administration of barium tablet  with thin liquids; Pt demonstrated good hyolaryngeal excursion, adequate laryngeal vestibule closure and good airway protection. ST will f/u X1 to ensure diet tolerance, thank you. SLP Visit Diagnosis Aphonia (R49.1);Dysphagia, unspecified (R13.10) Attention and concentration deficit following -- Frontal lobe and executive function deficit following -- Impact on safety and function Moderate aspiration risk   CHL IP TREATMENT RECOMMENDATION 11/12/2020 Treatment Recommendations Therapy as outlined in treatment plan below   Prognosis 11/12/2020 Prognosis for Safe Diet Advancement Good Barriers to Reach Goals -- Barriers/Prognosis Comment -- CHL IP DIET RECOMMENDATION 11/12/2020 SLP Diet Recommendations Dysphagia 3 (Mech soft) solids;Thin liquid Liquid Administration via Cup;Straw Medication Administration Whole meds with liquid Compensations Minimize environmental distractions;Slow rate;Small sips/bites;Follow solids with liquid Postural Changes Remain semi-upright after  after feeds/meals (Comment);Seated upright at 90 degrees   CHL IP OTHER RECOMMENDATIONS 11/12/2020 Recommended Consults -- Oral Care Recommendations Oral care BID Other Recommendations Have oral suction available   CHL IP FOLLOW UP RECOMMENDATIONS 11/12/2020 Follow up Recommendations LTACH   CHL IP FREQUENCY AND DURATION 11/12/2020 Speech Therapy Frequency (ACUTE ONLY) min 1 x/week Treatment Duration 1 week      CHL IP ORAL PHASE 11/12/2020 Oral Phase WFL Oral - Pudding Teaspoon -- Oral - Pudding Cup -- Oral - Honey Teaspoon -- Oral - Honey Cup -- Oral - Nectar Teaspoon -- Oral - Nectar Cup -- Oral - Nectar Straw -- Oral - Thin Teaspoon -- Oral - Thin Cup -- Oral - Thin Straw -- Oral - Puree -- Oral - Mech Soft -- Oral - Regular -- Oral - Multi-Consistency -- Oral - Pill -- Oral Phase - Comment --  CHL IP PHARYNGEAL PHASE 11/12/2020 Pharyngeal Phase WFL Pharyngeal- Pudding Teaspoon -- Pharyngeal -- Pharyngeal- Pudding Cup -- Pharyngeal -- Pharyngeal- Honey Teaspoon --  Pharyngeal -- Pharyngeal- Honey Cup -- Pharyngeal -- Pharyngeal- Nectar Teaspoon -- Pharyngeal -- Pharyngeal- Nectar Cup -- Pharyngeal -- Pharyngeal- Nectar Straw -- Pharyngeal -- Pharyngeal- Thin Teaspoon -- Pharyngeal -- Pharyngeal- Thin Cup -- Pharyngeal -- Pharyngeal- Thin Straw -- Pharyngeal -- Pharyngeal- Puree -- Pharyngeal -- Pharyngeal- Mechanical Soft -- Pharyngeal -- Pharyngeal- Regular -- Pharyngeal -- Pharyngeal- Multi-consistency -- Pharyngeal -- Pharyngeal- Pill -- Pharyngeal -- Pharyngeal Comment --  CHL IP CERVICAL ESOPHAGEAL PHASE 11/12/2020 Cervical Esophageal Phase WFL Pudding Teaspoon -- Pudding Cup -- Honey Teaspoon -- Honey Cup -- Nectar Teaspoon -- Nectar Cup -- Nectar Straw -- Thin Teaspoon -- Thin Cup -- Thin Straw -- Puree -- Mechanical Soft -- Regular -- Multi-consistency -- Pill -- Cervical Esophageal Comment -- Amelia H. Roddie Mc, CCC-SLP Speech Language Pathologist Wende Bushy 11/12/2020, 11:49 AM              ECHOCARDIOGRAM COMPLETE  Result Date: 10/21/2020    ECHOCARDIOGRAM REPORT   Patient Name:   HARRINGTON JOBE Date of Exam: 10/21/2020 Medical Rec #:  [id number]        Height:       68.0 in Accession #:    [id number]       Weight:       325.8 lb Date of Birth:  09-02-1966        BSA:          2.515 m Patient Age:    17 years         BP:           112/61 mmHg Patient Gender: M                HR:           51 bpm. Exam Location:  [facility] Procedure: 2D Echo Indications:    CHF-Acute Diastolic R74.08  History:        Patient has no prior history of Echocardiogram examinations.                 CHF; Risk Factors:Non-Smoker. Acute on chronic respiratory                 failure with hypoxia.  Sonographer:    Leavy Cella RDCS (AE) Referring Phys: [phone] DAVID TAT IMPRESSIONS  1. Left ventricular ejection fraction, by estimation, is 60 to 65%. The left ventricle has normal function. The left ventricle has no regional wall motion abnormalities. There is severe left  ventricular hypertrophy. Left ventricular diastolic parameters  are indeterminate. There is the interventricular septum is flattened in systole and diastole, consistent with right ventricular pressure and volume overload.  2. Right ventricular systolic function is mildly reduced. The right ventricular size is mildly enlarged. Tricuspid regurgitation signal is inadequate for assessing PA pressure.  3. Right atrial size was upper normal.  4. The pericardial effusion is posterior to the left ventricle.  5. The mitral valve is grossly normal. Trivial mitral valve regurgitation.  6. The aortic valve is tricuspid. Aortic valve regurgitation is not visualized.  7. The inferior vena cava is normal in size with greater than 50% respiratory variability, suggesting right atrial pressure of 3 mmHg. FINDINGS  Left Ventricle: Left ventricular ejection fraction, by estimation, is 60 to 65%. The left ventricle has normal function. The left ventricle has no regional wall motion abnormalities. The left ventricular internal cavity size was normal in size. There is  severe left ventricular hypertrophy. The interventricular septum is flattened in systole and diastole, consistent with right ventricular pressure and volume overload. Left ventricular diastolic parameters are indeterminate. Right Ventricle: The right ventricular size is mildly enlarged. No increase in right ventricular wall thickness. Right ventricular systolic function is mildly reduced. Tricuspid regurgitation signal is inadequate for assessing PA pressure. Left Atrium: Left atrial size was normal in size. Right Atrium: Right atrial size was upper normal. Pericardium: Trivial pericardial effusion is present. The pericardial effusion is posterior to the left ventricle. Mitral Valve: The mitral valve is grossly normal. Trivial mitral valve regurgitation. Tricuspid Valve: The tricuspid valve is grossly normal. Tricuspid valve regurgitation is mild. Aortic Valve: The aortic  valve is tricuspid. There is moderate aortic valve annular calcification. Aortic valve regurgitation is not visualized. Pulmonic Valve: The pulmonic valve was grossly normal. Pulmonic valve regurgitation is trivial. Aorta: The aortic root is normal in size and structure. Venous: The inferior vena cava is normal in size with greater than 50% respiratory variability, suggesting right atrial pressure of 3 mmHg. IAS/Shunts: No atrial level shunt detected by color flow Doppler.  LEFT VENTRICLE PLAX 2D LVIDd:         4.66 cm  Diastology LVIDs:         2.03 cm  LV e' medial:    5.87 cm/s LV PW:         1.76 cm  LV E/e' medial:  14.0 LV IVS:        1.73 cm  LV e' lateral:   8.49 cm/s LVOT diam:     1.90 cm  LV E/e' lateral: 9.7 LVOT Area:     2.84 cm  RIGHT VENTRICLE RV S prime:     19.40 cm/s TAPSE (M-mode): 1.9 cm LEFT ATRIUM             Index       RIGHT ATRIUM           Index LA diam:        3.50 cm 1.39 cm/m  RA Area:     24.60 cm LA Vol (A2C):   43.9 ml 17.46 ml/m RA Volume:   82.30 ml  32.73 ml/m LA Vol (A4C):   43.8 ml 17.42 ml/m LA Biplane Vol: 44.4 ml 17.66 ml/m   AORTA Ao Root diam: 3.10 cm MITRAL VALVE MV Area (PHT): 2.64 cm    SHUNTS MV Decel Time: 287 msec    Systemic Diam: 1.90 cm MV E velocity: 82.30 cm/s MV A velocity: 59.10 cm/s MV E/A ratio:  1.39 Rozann Lesches MD Electronically signed by Rozann Lesches MD Signature Date/Time: 10/21/2020/4:33:43 PM    Final    Time Spent: 79 minutes    Irwin Brakeman, MD  How to contact the [facility] Attending or Consulting provider [place] or covering provider during after hours [place], for this patient?  1. Check the care team in [facility] and look for a) attending/consulting [facility] provider listed and b) the [facility] team listed 2. Log into [email or web address] and use [place] universal password to access. If you do not have the password, please contact the hospital operator. 3. Locate the [facility] provider you are looking for under [facility] and page to a number  that you can be directly reached. 4. If you still have difficulty reaching the provider, please page the [facility] (Director on Call) for the Hospitalists listed on amion for assistance.  11/12/2020, 2:05 PM   LOS: 23 days

## 2020-11-12 NOTE — Progress Notes (Signed)
NAME:  Jack Cox, MRN:  [id number], DOB:  07-Mar-1966, LOS: 23 ADMISSION DATE:  10/20/2020, CONSULTATION DATE:  10/20/20  REFERRING MD:  Tat, [facility]  CHIEF COMPLAINT:  Resp distress   Brief History   55 yo male never smoker with hx of OSA s/p tracheostomy 2001 and previously followed by Dr. Elsworth Soho presented on 10/20/20 with fever, cough with purulent sputum.  Found to have severe hypoxia and hypercapnia and required ventilatory support in ER.  Past Medical History  OSA, Morbid obesity with BMI 48.10, CKD, CHF, HTN, Gout, [place] [facility] Events   12/17 changed to PCV prn  12/18 CXR improved  12/20 changed from propofol to Precedex 12/21 fever , bronch 12/30 added back mucomyst for plugs 01/03 tolerated 4 hrs of TC >> back on vent due to respiratory secretions and hypoxia 01/05 PEG placed 01/06 transition to trach collar 01/07 passed MBS >> D3 diet   Consults:  Palliative care  Nephrology  Procedures:  New cuffed trach 12/15 >>  PEG 1/06 >>   Significant Diagnostic Tests:  Echo 12/16 >> EF 60 to 65%, severe LVH, mild RV systolic dysfx Bronchoscopy 12/21 >> copious secretions  Micro Data:  COVID/Flu 12/15 >> negative MRSA  PCR  12/15 >> negative Blood 12/15 >> negative Sputum 12/16 >> oral flora Blood 12/19 >> negative Sputum 12/21 >> oral flora Sputum 12/31 >> oral flora  Antimicrobials:  zmax  12/15  only Rocephin 12/15  only Cefepime12/21 >> 12/27   Interim history/subjective:  Passed MBS.  Has been off vent since AM of 1/06.  Tolerating PM valve.  Objective   Blood pressure (!) 197/92, pulse 63, temperature 97.6 F (36.4 C), temperature source Oral, resp. rate 18, height 5\' 7"  (1.702 m), weight (!) 143.1 kg, SpO2 94 %.    FiO2 (%):  [40 %] 40 %   Intake/Output Summary (Last 24 hours) at 11/12/2020 1430 Last data filed at 11/12/2020 0956 Gross per 24 hour  Intake 130 ml  Output 3150 ml  Net -3020 ml   Filed Weights   11/05/20 0500 11/09/20  0448 11/10/20 0456  Weight: (!) 139.3 kg (!) 144.5 kg (!) 143.1 kg    Examination:  General - alert Eyes - pupils reactive ENT - trach site clean Cardiac - regular rate/rhythm, no murmur Chest - equal breath sounds b/l, no wheezing or rales Abdomen - soft, non tender, + bowel sounds, G tube in place Extremities - no cyanosis, clubbing, or edema Skin - no rashes Neuro - follows commands Psych - normal mood and behavior    Resolved Hospital Problem list   Tracheobronchitis/HCAP  Assessment & Plan:   Acute on chronic hypoxic/hypercapnic respiratory failure. OSA/OHS s/p tracheostomy. - trach collar as tolerated - goal SpO2 90 to 95% - PM valve as tolerate  - f/u CXR as needed  Recurrent mucus plugging. - continue albuterol, bronchial hygiene - mobilize as able  Dysphagia. - started on D3 diet  Acute on chronic diastolic CHF. Atrial flutter. CKD 4. - per primary team  Best practice:   Diet: D3 diet DVT prophylaxis: eliquis GI prophylaxis: Protonix Mobility: start to mobilize to chair as tol  Disposition: ICU; LTAC when bed available  Labs    CMP Latest Ref Rng & Units 11/12/2020 11/11/2020 11/10/2020  Glucose 70 - 99 mg/dL 114(H) 95 95  BUN 6 - 20 mg/dL 79(H) 80(H) 82(H)  Creatinine 0.61 - 1.24 mg/dL 1.77(H) 1.99(H) 1.78(H)  Sodium 135 - 145 mmol/L 142 141 138  Potassium 3.5 - 5.1 mmol/L 3.9 4.0 3.8  Chloride 98 - 111 mmol/L 104 103 100  CO2 22 - 32 mmol/L 29 28 29   Calcium 8.9 - 10.3 mg/dL 10.8(H) 10.8(H) 10.6(H)  Total Protein 6.5 - 8.1 g/dL - - -  Total Bilirubin 0.3 - 1.2 mg/dL - - -  Alkaline Phos 38 - 126 U/L - - -  AST 15 - 41 U/L - - -  ALT 0 - 44 U/L - - -    CBC Latest Ref Rng & Units 11/12/2020 11/11/2020 11/10/2020  WBC 4.0 - 10.5 K/uL 5.1 5.3 4.9  Hemoglobin 13.0 - 17.0 g/dL 11.6(L) 11.7(L) 11.2(L)  Hematocrit 39.0 - 52.0 % 39.9 40.2 38.2(L)  Platelets 150 - 400 K/uL 282 286 304    ABG    Component Value Date/Time   PHART 7.242 (L) 10/20/2020  2355   PCO2ART 79.3 (HH) 10/20/2020 2355   PO2ART 80.0 (L) 10/20/2020 2355   HCO3 27.6 10/20/2020 2355   O2SAT 93.6 10/20/2020 2355    CBG (last 3)  Recent Labs    11/12/20 0457 11/12/20 0750 11/12/20 1200  GLUCAP 116* 128* 98    Signature:  Chesley Mires, MD [place] Pager - [phone] 11/12/2020, 2:30 PM

## 2020-11-12 NOTE — Care Management Important Message (Signed)
Important Message  Patient Details  Name: Jack Cox MRN: [id number] Date of Birth: 06-12-1966   Medicare Important Message Given:  Yes     Tommy Medal 11/12/2020, 3:52 PM

## 2020-11-12 NOTE — Evaluation (Signed)
Clinical/Bedside Swallow Evaluation Patient Details  Name: Jack Cox MRN: [id number] Date of Birth: [birth year]  Today's Date: 11/12/2020 Time: SLP Start Time (ACUTE ONLY): 0801 SLP Stop Time (ACUTE ONLY): 0827 SLP Time Calculation (min) (ACUTE ONLY): 26 min  Past Medical History:  Past Medical History:  Diagnosis Date  . CKD (chronic kidney disease)   . Congestive heart failure (CHF) ([place])   . Essential hypertension   . Fatigue   . Gout   . Hypercholesteremia   . Insomnia   . Proteinuria   . Renal insufficiency   . Shortness of breath   . 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 IR GASTROSTOMY TUBE MOD SED  11/10/2020   HPI:  55 yo male never smoker with hx of OSA s/p tracheostomy 2001 and previously followed by Dr. Elsworth Soho presented on 10/20/20 with fever, cough with purulent sputum.  Found to have severe hypoxia and hypercapnia and required ventilatory support in ER. Pt's trach changed to new Shiley cuffed #4 on 12/15, underwent PEG 11/10/20. He has a PMSV from home. MD requested PMSV evaluation and swallow evaluation. Pt was eating regular foods and liquids prior to admission.   Assessment / Plan / Recommendation Clinical Impression  Clinical swallowing eval completed while Pt was sitting upright in bed; Respiratory Therapist deflated cuff and placed PMSV. Pt tolerated cuff deflation well, able to redirect air through upper airway with cuff deflated and PMSV placed, no change in sats. Pt consumed sips of water and ice chips without signs of reduced airway protection, Trials indicate Pt is appropriate and ready for MBSS today. Plan to proceed with MBSS later this morning. Recommend continue NPO pending MBSS results. SLP Visit Diagnosis: Aphonia (R49.1);Dysphagia, unspecified (R13.10)    Aspiration Risk  Moderate aspiration risk    Diet Recommendation Ice chips PRN after oral care (Water/ice chips ok but NPO  besides water pending MBSS)   Liquid Administration via: Cup;Straw    Other  Recommendations Oral Care Recommendations: Oral care QID;Oral care prior to ice chip/H20 Other Recommendations: Have oral suction available   Follow up Recommendations LTACH      Frequency and Duration min 2x/week  1 week       Prognosis Prognosis for Safe Diet Advancement: Good      Swallow Study   General HPI: 55 yo male never smoker with hx of OSA s/p tracheostomy 2001 and previously followed by Dr. Elsworth Soho presented on 10/20/20 with fever, cough with purulent sputum.  Found to have severe hypoxia and hypercapnia and required ventilatory support in ER. Pt's trach changed to new Shiley cuffed #4 on 12/15, underwent PEG 11/10/20. He has a PMSV from home. MD requested PMSV evaluation and swallow evaluation. Pt was eating regular foods and liquids prior to admission. Type of Study: Bedside Swallow Evaluation Previous Swallow Assessment: none in chart Diet Prior to this Study: Thin liquids Temperature Spikes Noted: No Respiratory Status: Nasal cannula Behavior/Cognition: Alert;Cooperative;Pleasant mood Oral Cavity Assessment: Within Functional Limits;Dry Oral Care Completed by SLP: Recent completion by staff Oral Cavity - Dentition: Adequate natural dentition;Missing dentition Vision: Functional for self-feeding Self-Feeding Abilities: Needs set up;Needs assist Patient Positioning: Upright in bed Baseline Vocal Quality: Normal Volitional Cough: Strong Volitional Swallow: Able to elicit    Oral/Motor/Sensory Function Overall Oral Motor/Sensory Function: Within functional limits   Ice Chips Ice chips: Within functional limits   Thin Liquid Thin Liquid: Within functional limits    Robin Pafford H.  Roddie Mc, [place] Speech Language Pathologist                        Wende Bushy 11/12/2020,8:40 AM

## 2020-11-13 DIAGNOSIS — R1310 Dysphagia, unspecified: Secondary | ICD-10-CM | POA: Diagnosis not present

## 2020-11-13 DIAGNOSIS — I482 Chronic atrial fibrillation, unspecified: Secondary | ICD-10-CM

## 2020-11-13 DIAGNOSIS — I4891 Unspecified atrial fibrillation: Secondary | ICD-10-CM | POA: Diagnosis not present

## 2020-11-13 DIAGNOSIS — J398 Other specified diseases of upper respiratory tract: Secondary | ICD-10-CM

## 2020-11-13 DIAGNOSIS — J9621 Acute and chronic respiratory failure with hypoxia: Secondary | ICD-10-CM

## 2020-11-13 DIAGNOSIS — R0902 Hypoxemia: Secondary | ICD-10-CM | POA: Diagnosis not present

## 2020-11-13 DIAGNOSIS — N184 Chronic kidney disease, stage 4 (severe): Secondary | ICD-10-CM | POA: Diagnosis not present

## 2020-11-13 DIAGNOSIS — G4733 Obstructive sleep apnea (adult) (pediatric): Secondary | ICD-10-CM

## 2020-11-13 DIAGNOSIS — N189 Chronic kidney disease, unspecified: Secondary | ICD-10-CM

## 2020-11-14 DIAGNOSIS — G4733 Obstructive sleep apnea (adult) (pediatric): Secondary | ICD-10-CM

## 2020-11-14 DIAGNOSIS — J398 Other specified diseases of upper respiratory tract: Secondary | ICD-10-CM

## 2020-11-14 DIAGNOSIS — I4891 Unspecified atrial fibrillation: Secondary | ICD-10-CM | POA: Diagnosis not present

## 2020-11-14 DIAGNOSIS — J9621 Acute and chronic respiratory failure with hypoxia: Secondary | ICD-10-CM

## 2020-11-14 DIAGNOSIS — I700 Atherosclerosis of aorta: Secondary | ICD-10-CM | POA: Diagnosis not present

## 2020-11-14 DIAGNOSIS — R1310 Dysphagia, unspecified: Secondary | ICD-10-CM | POA: Diagnosis not present

## 2020-11-14 DIAGNOSIS — N189 Chronic kidney disease, unspecified: Secondary | ICD-10-CM

## 2020-11-14 DIAGNOSIS — I482 Chronic atrial fibrillation, unspecified: Secondary | ICD-10-CM

## 2020-11-14 DIAGNOSIS — N184 Chronic kidney disease, stage 4 (severe): Secondary | ICD-10-CM | POA: Diagnosis not present

## 2020-11-14 DIAGNOSIS — R0902 Hypoxemia: Secondary | ICD-10-CM | POA: Diagnosis not present

## 2020-11-15 DIAGNOSIS — R54 Age-related physical debility: Secondary | ICD-10-CM | POA: Diagnosis not present

## 2020-11-15 DIAGNOSIS — N184 Chronic kidney disease, stage 4 (severe): Secondary | ICD-10-CM | POA: Diagnosis not present

## 2020-11-15 DIAGNOSIS — Z9989 Dependence on other enabling machines and devices: Secondary | ICD-10-CM | POA: Diagnosis not present

## 2020-11-15 DIAGNOSIS — J9601 Acute respiratory failure with hypoxia: Secondary | ICD-10-CM | POA: Diagnosis not present

## 2020-11-15 DIAGNOSIS — G4733 Obstructive sleep apnea (adult) (pediatric): Secondary | ICD-10-CM | POA: Diagnosis not present

## 2020-11-15 DIAGNOSIS — J9621 Acute and chronic respiratory failure with hypoxia: Secondary | ICD-10-CM | POA: Diagnosis not present

## 2020-11-15 DIAGNOSIS — R1312 Dysphagia, oropharyngeal phase: Secondary | ICD-10-CM | POA: Diagnosis not present

## 2020-11-15 DIAGNOSIS — I4892 Unspecified atrial flutter: Secondary | ICD-10-CM | POA: Diagnosis not present

## 2020-11-16 DIAGNOSIS — I4892 Unspecified atrial flutter: Secondary | ICD-10-CM | POA: Diagnosis not present

## 2020-11-16 DIAGNOSIS — R1312 Dysphagia, oropharyngeal phase: Secondary | ICD-10-CM | POA: Diagnosis not present

## 2020-11-16 DIAGNOSIS — J9621 Acute and chronic respiratory failure with hypoxia: Secondary | ICD-10-CM | POA: Diagnosis not present

## 2020-11-16 DIAGNOSIS — N184 Chronic kidney disease, stage 4 (severe): Secondary | ICD-10-CM | POA: Diagnosis not present

## 2020-11-16 DIAGNOSIS — R54 Age-related physical debility: Secondary | ICD-10-CM | POA: Diagnosis not present

## 2020-11-16 DIAGNOSIS — G4733 Obstructive sleep apnea (adult) (pediatric): Secondary | ICD-10-CM | POA: Diagnosis not present

## 2020-11-16 DIAGNOSIS — Z9989 Dependence on other enabling machines and devices: Secondary | ICD-10-CM | POA: Diagnosis not present

## 2020-11-16 DIAGNOSIS — J9601 Acute respiratory failure with hypoxia: Secondary | ICD-10-CM | POA: Diagnosis not present

## 2020-11-17 DIAGNOSIS — Z9989 Dependence on other enabling machines and devices: Secondary | ICD-10-CM | POA: Diagnosis not present

## 2020-11-17 DIAGNOSIS — R54 Age-related physical debility: Secondary | ICD-10-CM | POA: Diagnosis not present

## 2020-11-17 DIAGNOSIS — J9621 Acute and chronic respiratory failure with hypoxia: Secondary | ICD-10-CM | POA: Diagnosis not present

## 2020-11-17 DIAGNOSIS — N184 Chronic kidney disease, stage 4 (severe): Secondary | ICD-10-CM | POA: Diagnosis not present

## 2020-11-17 DIAGNOSIS — J9601 Acute respiratory failure with hypoxia: Secondary | ICD-10-CM | POA: Diagnosis not present

## 2020-11-17 DIAGNOSIS — G4733 Obstructive sleep apnea (adult) (pediatric): Secondary | ICD-10-CM | POA: Diagnosis not present

## 2020-11-17 DIAGNOSIS — I4892 Unspecified atrial flutter: Secondary | ICD-10-CM | POA: Diagnosis not present

## 2020-11-17 DIAGNOSIS — R1312 Dysphagia, oropharyngeal phase: Secondary | ICD-10-CM | POA: Diagnosis not present

## 2020-11-18 DIAGNOSIS — R54 Age-related physical debility: Secondary | ICD-10-CM | POA: Diagnosis not present

## 2020-11-18 DIAGNOSIS — G4733 Obstructive sleep apnea (adult) (pediatric): Secondary | ICD-10-CM | POA: Diagnosis not present

## 2020-11-18 DIAGNOSIS — J9621 Acute and chronic respiratory failure with hypoxia: Secondary | ICD-10-CM | POA: Diagnosis not present

## 2020-11-18 DIAGNOSIS — Z9989 Dependence on other enabling machines and devices: Secondary | ICD-10-CM | POA: Diagnosis not present

## 2020-11-18 DIAGNOSIS — R1312 Dysphagia, oropharyngeal phase: Secondary | ICD-10-CM | POA: Diagnosis not present

## 2020-11-18 DIAGNOSIS — N184 Chronic kidney disease, stage 4 (severe): Secondary | ICD-10-CM | POA: Diagnosis not present

## 2020-11-18 DIAGNOSIS — J9601 Acute respiratory failure with hypoxia: Secondary | ICD-10-CM | POA: Diagnosis not present

## 2020-11-18 DIAGNOSIS — I4892 Unspecified atrial flutter: Secondary | ICD-10-CM | POA: Diagnosis not present

## 2020-11-19 DIAGNOSIS — J9621 Acute and chronic respiratory failure with hypoxia: Secondary | ICD-10-CM | POA: Diagnosis not present

## 2020-11-19 DIAGNOSIS — G4733 Obstructive sleep apnea (adult) (pediatric): Secondary | ICD-10-CM | POA: Diagnosis not present

## 2020-11-19 DIAGNOSIS — N184 Chronic kidney disease, stage 4 (severe): Secondary | ICD-10-CM | POA: Diagnosis not present

## 2020-11-19 DIAGNOSIS — I4892 Unspecified atrial flutter: Secondary | ICD-10-CM | POA: Diagnosis not present

## 2020-11-19 DIAGNOSIS — J9601 Acute respiratory failure with hypoxia: Secondary | ICD-10-CM | POA: Diagnosis not present

## 2020-11-19 DIAGNOSIS — Z9989 Dependence on other enabling machines and devices: Secondary | ICD-10-CM | POA: Diagnosis not present

## 2020-11-19 DIAGNOSIS — R1312 Dysphagia, oropharyngeal phase: Secondary | ICD-10-CM | POA: Diagnosis not present

## 2020-11-19 DIAGNOSIS — R54 Age-related physical debility: Secondary | ICD-10-CM | POA: Diagnosis not present

## 2020-11-20 DIAGNOSIS — J9690 Respiratory failure, unspecified, unspecified whether with hypoxia or hypercapnia: Secondary | ICD-10-CM | POA: Diagnosis not present

## 2020-11-20 DIAGNOSIS — Z9989 Dependence on other enabling machines and devices: Secondary | ICD-10-CM | POA: Diagnosis not present

## 2020-11-20 DIAGNOSIS — J9621 Acute and chronic respiratory failure with hypoxia: Secondary | ICD-10-CM | POA: Diagnosis not present

## 2020-11-20 DIAGNOSIS — I10 Essential (primary) hypertension: Secondary | ICD-10-CM | POA: Diagnosis not present

## 2020-11-20 DIAGNOSIS — G4733 Obstructive sleep apnea (adult) (pediatric): Secondary | ICD-10-CM | POA: Diagnosis not present

## 2020-11-21 DIAGNOSIS — J9621 Acute and chronic respiratory failure with hypoxia: Secondary | ICD-10-CM | POA: Diagnosis not present

## 2020-11-21 DIAGNOSIS — J9690 Respiratory failure, unspecified, unspecified whether with hypoxia or hypercapnia: Secondary | ICD-10-CM | POA: Diagnosis not present

## 2020-11-21 DIAGNOSIS — G4733 Obstructive sleep apnea (adult) (pediatric): Secondary | ICD-10-CM | POA: Diagnosis not present

## 2020-11-21 DIAGNOSIS — I10 Essential (primary) hypertension: Secondary | ICD-10-CM | POA: Diagnosis not present

## 2020-11-21 DIAGNOSIS — Z9989 Dependence on other enabling machines and devices: Secondary | ICD-10-CM | POA: Diagnosis not present

## 2020-11-22 DIAGNOSIS — G4733 Obstructive sleep apnea (adult) (pediatric): Secondary | ICD-10-CM

## 2020-11-22 DIAGNOSIS — R54 Age-related physical debility: Secondary | ICD-10-CM | POA: Diagnosis not present

## 2020-11-22 DIAGNOSIS — N189 Chronic kidney disease, unspecified: Secondary | ICD-10-CM

## 2020-11-22 DIAGNOSIS — J398 Other specified diseases of upper respiratory tract: Secondary | ICD-10-CM

## 2020-11-22 DIAGNOSIS — I482 Chronic atrial fibrillation, unspecified: Secondary | ICD-10-CM

## 2020-11-22 DIAGNOSIS — J9601 Acute respiratory failure with hypoxia: Secondary | ICD-10-CM | POA: Diagnosis not present

## 2020-11-22 DIAGNOSIS — J9621 Acute and chronic respiratory failure with hypoxia: Secondary | ICD-10-CM

## 2020-11-22 DIAGNOSIS — N179 Acute kidney failure, unspecified: Secondary | ICD-10-CM | POA: Diagnosis not present

## 2020-11-22 DIAGNOSIS — R1312 Dysphagia, oropharyngeal phase: Secondary | ICD-10-CM | POA: Diagnosis not present

## 2020-11-23 DIAGNOSIS — N179 Acute kidney failure, unspecified: Secondary | ICD-10-CM | POA: Diagnosis not present

## 2020-11-23 DIAGNOSIS — G4733 Obstructive sleep apnea (adult) (pediatric): Secondary | ICD-10-CM

## 2020-11-23 DIAGNOSIS — J9621 Acute and chronic respiratory failure with hypoxia: Secondary | ICD-10-CM

## 2020-11-23 DIAGNOSIS — R54 Age-related physical debility: Secondary | ICD-10-CM | POA: Diagnosis not present

## 2020-11-23 DIAGNOSIS — I482 Chronic atrial fibrillation, unspecified: Secondary | ICD-10-CM

## 2020-11-23 DIAGNOSIS — R1312 Dysphagia, oropharyngeal phase: Secondary | ICD-10-CM | POA: Diagnosis not present

## 2020-11-23 DIAGNOSIS — N189 Chronic kidney disease, unspecified: Secondary | ICD-10-CM

## 2020-11-23 DIAGNOSIS — J9601 Acute respiratory failure with hypoxia: Secondary | ICD-10-CM | POA: Diagnosis not present

## 2020-11-23 DIAGNOSIS — J398 Other specified diseases of upper respiratory tract: Secondary | ICD-10-CM

## 2020-11-24 DIAGNOSIS — J398 Other specified diseases of upper respiratory tract: Secondary | ICD-10-CM

## 2020-11-24 DIAGNOSIS — R54 Age-related physical debility: Secondary | ICD-10-CM | POA: Diagnosis not present

## 2020-11-24 DIAGNOSIS — J9601 Acute respiratory failure with hypoxia: Secondary | ICD-10-CM | POA: Diagnosis not present

## 2020-11-24 DIAGNOSIS — N179 Acute kidney failure, unspecified: Secondary | ICD-10-CM | POA: Diagnosis not present

## 2020-11-24 DIAGNOSIS — J9621 Acute and chronic respiratory failure with hypoxia: Secondary | ICD-10-CM

## 2020-11-24 DIAGNOSIS — I482 Chronic atrial fibrillation, unspecified: Secondary | ICD-10-CM

## 2020-11-24 DIAGNOSIS — R1312 Dysphagia, oropharyngeal phase: Secondary | ICD-10-CM | POA: Diagnosis not present

## 2020-11-24 DIAGNOSIS — G4733 Obstructive sleep apnea (adult) (pediatric): Secondary | ICD-10-CM

## 2020-11-24 DIAGNOSIS — N189 Chronic kidney disease, unspecified: Secondary | ICD-10-CM

## 2020-11-25 DIAGNOSIS — N179 Acute kidney failure, unspecified: Secondary | ICD-10-CM | POA: Diagnosis not present

## 2020-11-25 DIAGNOSIS — J398 Other specified diseases of upper respiratory tract: Secondary | ICD-10-CM

## 2020-11-25 DIAGNOSIS — R54 Age-related physical debility: Secondary | ICD-10-CM | POA: Diagnosis not present

## 2020-11-25 DIAGNOSIS — J9621 Acute and chronic respiratory failure with hypoxia: Secondary | ICD-10-CM

## 2020-11-25 DIAGNOSIS — J9601 Acute respiratory failure with hypoxia: Secondary | ICD-10-CM | POA: Diagnosis not present

## 2020-11-25 DIAGNOSIS — N189 Chronic kidney disease, unspecified: Secondary | ICD-10-CM

## 2020-11-25 DIAGNOSIS — G4733 Obstructive sleep apnea (adult) (pediatric): Secondary | ICD-10-CM

## 2020-11-25 DIAGNOSIS — R1312 Dysphagia, oropharyngeal phase: Secondary | ICD-10-CM | POA: Diagnosis not present

## 2020-11-25 DIAGNOSIS — I482 Chronic atrial fibrillation, unspecified: Secondary | ICD-10-CM

## 2020-11-26 DIAGNOSIS — I482 Chronic atrial fibrillation, unspecified: Secondary | ICD-10-CM

## 2020-11-26 DIAGNOSIS — R54 Age-related physical debility: Secondary | ICD-10-CM | POA: Diagnosis not present

## 2020-11-26 DIAGNOSIS — R1312 Dysphagia, oropharyngeal phase: Secondary | ICD-10-CM | POA: Diagnosis not present

## 2020-11-26 DIAGNOSIS — J398 Other specified diseases of upper respiratory tract: Secondary | ICD-10-CM

## 2020-11-26 DIAGNOSIS — J9621 Acute and chronic respiratory failure with hypoxia: Secondary | ICD-10-CM

## 2020-11-26 DIAGNOSIS — N179 Acute kidney failure, unspecified: Secondary | ICD-10-CM | POA: Diagnosis not present

## 2020-11-26 DIAGNOSIS — N189 Chronic kidney disease, unspecified: Secondary | ICD-10-CM

## 2020-11-26 DIAGNOSIS — J9601 Acute respiratory failure with hypoxia: Secondary | ICD-10-CM | POA: Diagnosis not present

## 2020-11-26 DIAGNOSIS — G4733 Obstructive sleep apnea (adult) (pediatric): Secondary | ICD-10-CM

## 2020-11-27 DIAGNOSIS — I480 Paroxysmal atrial fibrillation: Secondary | ICD-10-CM | POA: Diagnosis not present

## 2020-11-27 DIAGNOSIS — N189 Chronic kidney disease, unspecified: Secondary | ICD-10-CM

## 2020-11-27 DIAGNOSIS — R5381 Other malaise: Secondary | ICD-10-CM | POA: Diagnosis not present

## 2020-11-27 DIAGNOSIS — J9621 Acute and chronic respiratory failure with hypoxia: Secondary | ICD-10-CM

## 2020-11-27 DIAGNOSIS — J398 Other specified diseases of upper respiratory tract: Secondary | ICD-10-CM

## 2020-11-27 DIAGNOSIS — I482 Chronic atrial fibrillation, unspecified: Secondary | ICD-10-CM

## 2020-11-27 DIAGNOSIS — G4733 Obstructive sleep apnea (adult) (pediatric): Secondary | ICD-10-CM

## 2020-11-28 DIAGNOSIS — J9621 Acute and chronic respiratory failure with hypoxia: Secondary | ICD-10-CM

## 2020-11-28 DIAGNOSIS — G4733 Obstructive sleep apnea (adult) (pediatric): Secondary | ICD-10-CM

## 2020-11-28 DIAGNOSIS — R5381 Other malaise: Secondary | ICD-10-CM | POA: Diagnosis not present

## 2020-11-28 DIAGNOSIS — J398 Other specified diseases of upper respiratory tract: Secondary | ICD-10-CM

## 2020-11-28 DIAGNOSIS — I480 Paroxysmal atrial fibrillation: Secondary | ICD-10-CM | POA: Diagnosis not present

## 2020-11-28 DIAGNOSIS — I482 Chronic atrial fibrillation, unspecified: Secondary | ICD-10-CM

## 2020-11-28 DIAGNOSIS — N189 Chronic kidney disease, unspecified: Secondary | ICD-10-CM

## 2020-11-29 DIAGNOSIS — R1312 Dysphagia, oropharyngeal phase: Secondary | ICD-10-CM | POA: Diagnosis not present

## 2020-11-29 DIAGNOSIS — Z9989 Dependence on other enabling machines and devices: Secondary | ICD-10-CM | POA: Diagnosis not present

## 2020-11-29 DIAGNOSIS — R54 Age-related physical debility: Secondary | ICD-10-CM | POA: Diagnosis not present

## 2020-11-29 DIAGNOSIS — J9621 Acute and chronic respiratory failure with hypoxia: Secondary | ICD-10-CM | POA: Diagnosis not present

## 2020-11-29 DIAGNOSIS — J9601 Acute respiratory failure with hypoxia: Secondary | ICD-10-CM | POA: Diagnosis not present

## 2020-11-29 DIAGNOSIS — N179 Acute kidney failure, unspecified: Secondary | ICD-10-CM | POA: Diagnosis not present

## 2020-11-29 DIAGNOSIS — G4733 Obstructive sleep apnea (adult) (pediatric): Secondary | ICD-10-CM | POA: Diagnosis not present

## 2020-11-30 DIAGNOSIS — G4733 Obstructive sleep apnea (adult) (pediatric): Secondary | ICD-10-CM | POA: Diagnosis not present

## 2020-11-30 DIAGNOSIS — J9601 Acute respiratory failure with hypoxia: Secondary | ICD-10-CM | POA: Diagnosis not present

## 2020-11-30 DIAGNOSIS — N179 Acute kidney failure, unspecified: Secondary | ICD-10-CM | POA: Diagnosis not present

## 2020-11-30 DIAGNOSIS — Z9989 Dependence on other enabling machines and devices: Secondary | ICD-10-CM | POA: Diagnosis not present

## 2020-11-30 DIAGNOSIS — J9621 Acute and chronic respiratory failure with hypoxia: Secondary | ICD-10-CM | POA: Diagnosis not present

## 2020-11-30 DIAGNOSIS — R1312 Dysphagia, oropharyngeal phase: Secondary | ICD-10-CM | POA: Diagnosis not present

## 2020-11-30 DIAGNOSIS — R54 Age-related physical debility: Secondary | ICD-10-CM | POA: Diagnosis not present

## 2020-12-01 DIAGNOSIS — R54 Age-related physical debility: Secondary | ICD-10-CM | POA: Diagnosis not present

## 2020-12-01 DIAGNOSIS — G4733 Obstructive sleep apnea (adult) (pediatric): Secondary | ICD-10-CM | POA: Diagnosis not present

## 2020-12-01 DIAGNOSIS — J9621 Acute and chronic respiratory failure with hypoxia: Secondary | ICD-10-CM | POA: Diagnosis not present

## 2020-12-01 DIAGNOSIS — N179 Acute kidney failure, unspecified: Secondary | ICD-10-CM | POA: Diagnosis not present

## 2020-12-01 DIAGNOSIS — J9601 Acute respiratory failure with hypoxia: Secondary | ICD-10-CM | POA: Diagnosis not present

## 2020-12-01 DIAGNOSIS — Z9989 Dependence on other enabling machines and devices: Secondary | ICD-10-CM | POA: Diagnosis not present

## 2020-12-01 DIAGNOSIS — R1312 Dysphagia, oropharyngeal phase: Secondary | ICD-10-CM | POA: Diagnosis not present

## 2020-12-02 DIAGNOSIS — J9601 Acute respiratory failure with hypoxia: Secondary | ICD-10-CM | POA: Diagnosis not present

## 2020-12-02 DIAGNOSIS — Z9989 Dependence on other enabling machines and devices: Secondary | ICD-10-CM | POA: Diagnosis not present

## 2020-12-02 DIAGNOSIS — R54 Age-related physical debility: Secondary | ICD-10-CM | POA: Diagnosis not present

## 2020-12-02 DIAGNOSIS — J9621 Acute and chronic respiratory failure with hypoxia: Secondary | ICD-10-CM | POA: Diagnosis not present

## 2020-12-02 DIAGNOSIS — N179 Acute kidney failure, unspecified: Secondary | ICD-10-CM | POA: Diagnosis not present

## 2020-12-02 DIAGNOSIS — G4733 Obstructive sleep apnea (adult) (pediatric): Secondary | ICD-10-CM | POA: Diagnosis not present

## 2020-12-02 DIAGNOSIS — R1312 Dysphagia, oropharyngeal phase: Secondary | ICD-10-CM | POA: Diagnosis not present

## 2020-12-03 DIAGNOSIS — J9601 Acute respiratory failure with hypoxia: Secondary | ICD-10-CM | POA: Diagnosis not present

## 2020-12-03 DIAGNOSIS — R54 Age-related physical debility: Secondary | ICD-10-CM | POA: Diagnosis not present

## 2020-12-03 DIAGNOSIS — R1312 Dysphagia, oropharyngeal phase: Secondary | ICD-10-CM | POA: Diagnosis not present

## 2020-12-03 DIAGNOSIS — G4733 Obstructive sleep apnea (adult) (pediatric): Secondary | ICD-10-CM | POA: Diagnosis not present

## 2020-12-03 DIAGNOSIS — Z9989 Dependence on other enabling machines and devices: Secondary | ICD-10-CM | POA: Diagnosis not present

## 2020-12-03 DIAGNOSIS — N179 Acute kidney failure, unspecified: Secondary | ICD-10-CM | POA: Diagnosis not present

## 2020-12-03 DIAGNOSIS — J9621 Acute and chronic respiratory failure with hypoxia: Secondary | ICD-10-CM | POA: Diagnosis not present

## 2020-12-04 DIAGNOSIS — J9621 Acute and chronic respiratory failure with hypoxia: Secondary | ICD-10-CM | POA: Diagnosis not present

## 2020-12-04 DIAGNOSIS — R54 Age-related physical debility: Secondary | ICD-10-CM | POA: Diagnosis not present

## 2020-12-04 DIAGNOSIS — Z9989 Dependence on other enabling machines and devices: Secondary | ICD-10-CM | POA: Diagnosis not present

## 2020-12-04 DIAGNOSIS — G4733 Obstructive sleep apnea (adult) (pediatric): Secondary | ICD-10-CM | POA: Diagnosis not present

## 2020-12-04 DIAGNOSIS — J9601 Acute respiratory failure with hypoxia: Secondary | ICD-10-CM | POA: Diagnosis not present

## 2020-12-04 DIAGNOSIS — N179 Acute kidney failure, unspecified: Secondary | ICD-10-CM | POA: Diagnosis not present

## 2020-12-04 DIAGNOSIS — R1312 Dysphagia, oropharyngeal phase: Secondary | ICD-10-CM | POA: Diagnosis not present

## 2020-12-05 DIAGNOSIS — G4733 Obstructive sleep apnea (adult) (pediatric): Secondary | ICD-10-CM | POA: Diagnosis not present

## 2020-12-05 DIAGNOSIS — Z9989 Dependence on other enabling machines and devices: Secondary | ICD-10-CM | POA: Diagnosis not present

## 2020-12-05 DIAGNOSIS — J9621 Acute and chronic respiratory failure with hypoxia: Secondary | ICD-10-CM | POA: Diagnosis not present

## 2020-12-06 DIAGNOSIS — G4733 Obstructive sleep apnea (adult) (pediatric): Secondary | ICD-10-CM

## 2020-12-06 DIAGNOSIS — I482 Chronic atrial fibrillation, unspecified: Secondary | ICD-10-CM

## 2020-12-06 DIAGNOSIS — J9621 Acute and chronic respiratory failure with hypoxia: Secondary | ICD-10-CM

## 2020-12-06 DIAGNOSIS — J398 Other specified diseases of upper respiratory tract: Secondary | ICD-10-CM

## 2020-12-06 DIAGNOSIS — N189 Chronic kidney disease, unspecified: Secondary | ICD-10-CM

## 2020-12-07 DIAGNOSIS — I482 Chronic atrial fibrillation, unspecified: Secondary | ICD-10-CM

## 2020-12-07 DIAGNOSIS — G4733 Obstructive sleep apnea (adult) (pediatric): Secondary | ICD-10-CM

## 2020-12-07 DIAGNOSIS — J9621 Acute and chronic respiratory failure with hypoxia: Secondary | ICD-10-CM

## 2020-12-07 DIAGNOSIS — N189 Chronic kidney disease, unspecified: Secondary | ICD-10-CM

## 2020-12-07 DIAGNOSIS — J398 Other specified diseases of upper respiratory tract: Secondary | ICD-10-CM

## 2020-12-08 DIAGNOSIS — J398 Other specified diseases of upper respiratory tract: Secondary | ICD-10-CM

## 2020-12-08 DIAGNOSIS — J9621 Acute and chronic respiratory failure with hypoxia: Secondary | ICD-10-CM

## 2020-12-08 DIAGNOSIS — N189 Chronic kidney disease, unspecified: Secondary | ICD-10-CM

## 2020-12-08 DIAGNOSIS — I482 Chronic atrial fibrillation, unspecified: Secondary | ICD-10-CM

## 2020-12-08 DIAGNOSIS — G4733 Obstructive sleep apnea (adult) (pediatric): Secondary | ICD-10-CM

## 2020-12-09 DIAGNOSIS — J9621 Acute and chronic respiratory failure with hypoxia: Secondary | ICD-10-CM

## 2020-12-09 DIAGNOSIS — J398 Other specified diseases of upper respiratory tract: Secondary | ICD-10-CM

## 2020-12-09 DIAGNOSIS — G4733 Obstructive sleep apnea (adult) (pediatric): Secondary | ICD-10-CM

## 2020-12-09 DIAGNOSIS — I482 Chronic atrial fibrillation, unspecified: Secondary | ICD-10-CM

## 2020-12-09 DIAGNOSIS — N189 Chronic kidney disease, unspecified: Secondary | ICD-10-CM

## 2020-12-10 DIAGNOSIS — Z930 Tracheostomy status: Secondary | ICD-10-CM | POA: Diagnosis not present

## 2020-12-10 DIAGNOSIS — G4733 Obstructive sleep apnea (adult) (pediatric): Secondary | ICD-10-CM

## 2020-12-10 DIAGNOSIS — N189 Chronic kidney disease, unspecified: Secondary | ICD-10-CM

## 2020-12-10 DIAGNOSIS — I482 Chronic atrial fibrillation, unspecified: Secondary | ICD-10-CM

## 2020-12-10 DIAGNOSIS — J398 Other specified diseases of upper respiratory tract: Secondary | ICD-10-CM

## 2020-12-10 DIAGNOSIS — Z9911 Dependence on respirator [ventilator] status: Secondary | ICD-10-CM | POA: Diagnosis not present

## 2020-12-10 DIAGNOSIS — J9621 Acute and chronic respiratory failure with hypoxia: Secondary | ICD-10-CM

## 2020-12-11 DIAGNOSIS — G4733 Obstructive sleep apnea (adult) (pediatric): Secondary | ICD-10-CM

## 2020-12-11 DIAGNOSIS — I480 Paroxysmal atrial fibrillation: Secondary | ICD-10-CM | POA: Diagnosis not present

## 2020-12-11 DIAGNOSIS — N189 Chronic kidney disease, unspecified: Secondary | ICD-10-CM

## 2020-12-11 DIAGNOSIS — R5381 Other malaise: Secondary | ICD-10-CM | POA: Diagnosis not present

## 2020-12-11 DIAGNOSIS — J398 Other specified diseases of upper respiratory tract: Secondary | ICD-10-CM

## 2020-12-11 DIAGNOSIS — J9621 Acute and chronic respiratory failure with hypoxia: Secondary | ICD-10-CM

## 2020-12-11 DIAGNOSIS — I482 Chronic atrial fibrillation, unspecified: Secondary | ICD-10-CM

## 2020-12-12 DIAGNOSIS — I480 Paroxysmal atrial fibrillation: Secondary | ICD-10-CM | POA: Diagnosis not present

## 2020-12-12 DIAGNOSIS — R5381 Other malaise: Secondary | ICD-10-CM | POA: Diagnosis not present

## 2020-12-13 DIAGNOSIS — J9621 Acute and chronic respiratory failure with hypoxia: Secondary | ICD-10-CM | POA: Diagnosis not present

## 2020-12-13 DIAGNOSIS — G4733 Obstructive sleep apnea (adult) (pediatric): Secondary | ICD-10-CM | POA: Diagnosis not present

## 2020-12-13 DIAGNOSIS — Z9989 Dependence on other enabling machines and devices: Secondary | ICD-10-CM | POA: Diagnosis not present

## 2020-12-14 DIAGNOSIS — J9621 Acute and chronic respiratory failure with hypoxia: Secondary | ICD-10-CM | POA: Diagnosis not present

## 2020-12-14 DIAGNOSIS — G4733 Obstructive sleep apnea (adult) (pediatric): Secondary | ICD-10-CM | POA: Diagnosis not present

## 2020-12-14 DIAGNOSIS — Z9989 Dependence on other enabling machines and devices: Secondary | ICD-10-CM | POA: Diagnosis not present

## 2020-12-15 DIAGNOSIS — G4733 Obstructive sleep apnea (adult) (pediatric): Secondary | ICD-10-CM | POA: Diagnosis not present

## 2020-12-15 DIAGNOSIS — J9621 Acute and chronic respiratory failure with hypoxia: Secondary | ICD-10-CM | POA: Diagnosis not present

## 2020-12-15 DIAGNOSIS — Z9989 Dependence on other enabling machines and devices: Secondary | ICD-10-CM | POA: Diagnosis not present

## 2020-12-16 DIAGNOSIS — G4733 Obstructive sleep apnea (adult) (pediatric): Secondary | ICD-10-CM | POA: Diagnosis not present

## 2020-12-16 DIAGNOSIS — J9621 Acute and chronic respiratory failure with hypoxia: Secondary | ICD-10-CM | POA: Diagnosis not present

## 2020-12-16 DIAGNOSIS — Z9989 Dependence on other enabling machines and devices: Secondary | ICD-10-CM | POA: Diagnosis not present

## 2020-12-17 DIAGNOSIS — G4733 Obstructive sleep apnea (adult) (pediatric): Secondary | ICD-10-CM | POA: Diagnosis not present

## 2020-12-17 DIAGNOSIS — Z9989 Dependence on other enabling machines and devices: Secondary | ICD-10-CM | POA: Diagnosis not present

## 2020-12-17 DIAGNOSIS — J9621 Acute and chronic respiratory failure with hypoxia: Secondary | ICD-10-CM | POA: Diagnosis not present

## 2020-12-18 DIAGNOSIS — J9690 Respiratory failure, unspecified, unspecified whether with hypoxia or hypercapnia: Secondary | ICD-10-CM | POA: Diagnosis not present

## 2020-12-18 DIAGNOSIS — Z9989 Dependence on other enabling machines and devices: Secondary | ICD-10-CM | POA: Diagnosis not present

## 2020-12-18 DIAGNOSIS — G4733 Obstructive sleep apnea (adult) (pediatric): Secondary | ICD-10-CM | POA: Diagnosis not present

## 2020-12-18 DIAGNOSIS — I10 Essential (primary) hypertension: Secondary | ICD-10-CM | POA: Diagnosis not present

## 2020-12-18 DIAGNOSIS — J9621 Acute and chronic respiratory failure with hypoxia: Secondary | ICD-10-CM | POA: Diagnosis not present

## 2020-12-19 DIAGNOSIS — I10 Essential (primary) hypertension: Secondary | ICD-10-CM | POA: Diagnosis not present

## 2020-12-19 DIAGNOSIS — Z9989 Dependence on other enabling machines and devices: Secondary | ICD-10-CM | POA: Diagnosis not present

## 2020-12-19 DIAGNOSIS — J9621 Acute and chronic respiratory failure with hypoxia: Secondary | ICD-10-CM | POA: Diagnosis not present

## 2020-12-19 DIAGNOSIS — G4733 Obstructive sleep apnea (adult) (pediatric): Secondary | ICD-10-CM | POA: Diagnosis not present

## 2020-12-19 DIAGNOSIS — J9690 Respiratory failure, unspecified, unspecified whether with hypoxia or hypercapnia: Secondary | ICD-10-CM | POA: Diagnosis not present

## 2020-12-20 DIAGNOSIS — N189 Chronic kidney disease, unspecified: Secondary | ICD-10-CM

## 2020-12-20 DIAGNOSIS — J398 Other specified diseases of upper respiratory tract: Secondary | ICD-10-CM

## 2020-12-20 DIAGNOSIS — J9621 Acute and chronic respiratory failure with hypoxia: Secondary | ICD-10-CM

## 2020-12-20 DIAGNOSIS — I482 Chronic atrial fibrillation, unspecified: Secondary | ICD-10-CM

## 2020-12-20 DIAGNOSIS — G4733 Obstructive sleep apnea (adult) (pediatric): Secondary | ICD-10-CM

## 2020-12-21 DIAGNOSIS — Z743 Need for continuous supervision: Secondary | ICD-10-CM | POA: Diagnosis not present

## 2020-12-21 DIAGNOSIS — R2689 Other abnormalities of gait and mobility: Secondary | ICD-10-CM | POA: Diagnosis not present

## 2020-12-21 DIAGNOSIS — G4733 Obstructive sleep apnea (adult) (pediatric): Secondary | ICD-10-CM | POA: Diagnosis not present

## 2020-12-21 DIAGNOSIS — R0902 Hypoxemia: Secondary | ICD-10-CM | POA: Diagnosis not present

## 2020-12-21 DIAGNOSIS — I10 Essential (primary) hypertension: Secondary | ICD-10-CM | POA: Diagnosis not present

## 2020-12-21 DIAGNOSIS — Z299 Encounter for prophylactic measures, unspecified: Secondary | ICD-10-CM | POA: Diagnosis not present

## 2020-12-21 DIAGNOSIS — M6281 Muscle weakness (generalized): Secondary | ICD-10-CM | POA: Diagnosis not present

## 2020-12-21 DIAGNOSIS — Z930 Tracheostomy status: Secondary | ICD-10-CM | POA: Diagnosis not present

## 2020-12-21 DIAGNOSIS — Z931 Gastrostomy status: Secondary | ICD-10-CM | POA: Diagnosis not present

## 2020-12-21 DIAGNOSIS — M2550 Pain in unspecified joint: Secondary | ICD-10-CM | POA: Diagnosis not present

## 2020-12-21 DIAGNOSIS — Z9981 Dependence on supplemental oxygen: Secondary | ICD-10-CM | POA: Diagnosis not present

## 2020-12-21 DIAGNOSIS — J9620 Acute and chronic respiratory failure, unspecified whether with hypoxia or hypercapnia: Secondary | ICD-10-CM | POA: Diagnosis not present

## 2020-12-21 DIAGNOSIS — J9690 Respiratory failure, unspecified, unspecified whether with hypoxia or hypercapnia: Secondary | ICD-10-CM | POA: Diagnosis not present

## 2020-12-21 DIAGNOSIS — R1312 Dysphagia, oropharyngeal phase: Secondary | ICD-10-CM | POA: Diagnosis not present

## 2020-12-21 DIAGNOSIS — I4892 Unspecified atrial flutter: Secondary | ICD-10-CM | POA: Diagnosis not present

## 2020-12-21 DIAGNOSIS — Z430 Encounter for attention to tracheostomy: Secondary | ICD-10-CM | POA: Diagnosis not present

## 2020-12-21 DIAGNOSIS — I509 Heart failure, unspecified: Secondary | ICD-10-CM | POA: Diagnosis not present

## 2020-12-21 DIAGNOSIS — Z7401 Bed confinement status: Secondary | ICD-10-CM | POA: Diagnosis not present

## 2020-12-21 DIAGNOSIS — I4891 Unspecified atrial fibrillation: Secondary | ICD-10-CM | POA: Diagnosis not present

## 2020-12-21 DIAGNOSIS — N184 Chronic kidney disease, stage 4 (severe): Secondary | ICD-10-CM | POA: Diagnosis not present

## 2021-01-04 DIAGNOSIS — I4892 Unspecified atrial flutter: Secondary | ICD-10-CM | POA: Diagnosis not present

## 2021-01-04 DIAGNOSIS — J9620 Acute and chronic respiratory failure, unspecified whether with hypoxia or hypercapnia: Secondary | ICD-10-CM | POA: Diagnosis not present

## 2021-01-04 DIAGNOSIS — M6281 Muscle weakness (generalized): Secondary | ICD-10-CM | POA: Diagnosis not present

## 2021-01-07 DIAGNOSIS — I4891 Unspecified atrial fibrillation: Secondary | ICD-10-CM | POA: Diagnosis not present

## 2021-01-07 DIAGNOSIS — I509 Heart failure, unspecified: Secondary | ICD-10-CM | POA: Diagnosis not present

## 2021-01-07 DIAGNOSIS — N184 Chronic kidney disease, stage 4 (severe): Secondary | ICD-10-CM | POA: Diagnosis not present

## 2021-01-07 DIAGNOSIS — J9620 Acute and chronic respiratory failure, unspecified whether with hypoxia or hypercapnia: Secondary | ICD-10-CM | POA: Diagnosis not present

## 2021-01-07 DIAGNOSIS — M6281 Muscle weakness (generalized): Secondary | ICD-10-CM | POA: Diagnosis not present

## 2021-01-07 DIAGNOSIS — Z930 Tracheostomy status: Secondary | ICD-10-CM | POA: Diagnosis not present

## 2021-01-07 DIAGNOSIS — I130 Hypertensive heart and chronic kidney disease with heart failure and stage 1 through stage 4 chronic kidney disease, or unspecified chronic kidney disease: Secondary | ICD-10-CM | POA: Diagnosis not present

## 2021-01-09 DIAGNOSIS — I4892 Unspecified atrial flutter: Secondary | ICD-10-CM | POA: Diagnosis not present

## 2021-01-09 DIAGNOSIS — J9620 Acute and chronic respiratory failure, unspecified whether with hypoxia or hypercapnia: Secondary | ICD-10-CM | POA: Diagnosis not present

## 2021-01-09 DIAGNOSIS — M6281 Muscle weakness (generalized): Secondary | ICD-10-CM | POA: Diagnosis not present

## 2021-01-11 DIAGNOSIS — I509 Heart failure, unspecified: Secondary | ICD-10-CM | POA: Diagnosis not present

## 2021-01-11 DIAGNOSIS — N184 Chronic kidney disease, stage 4 (severe): Secondary | ICD-10-CM | POA: Diagnosis not present

## 2021-01-11 DIAGNOSIS — J9620 Acute and chronic respiratory failure, unspecified whether with hypoxia or hypercapnia: Secondary | ICD-10-CM | POA: Diagnosis not present

## 2021-01-11 DIAGNOSIS — M6281 Muscle weakness (generalized): Secondary | ICD-10-CM | POA: Diagnosis not present

## 2021-01-11 DIAGNOSIS — I130 Hypertensive heart and chronic kidney disease with heart failure and stage 1 through stage 4 chronic kidney disease, or unspecified chronic kidney disease: Secondary | ICD-10-CM | POA: Diagnosis not present

## 2021-01-11 DIAGNOSIS — Z930 Tracheostomy status: Secondary | ICD-10-CM | POA: Diagnosis not present

## 2021-01-11 DIAGNOSIS — I4891 Unspecified atrial fibrillation: Secondary | ICD-10-CM | POA: Diagnosis not present

## 2021-01-13 DIAGNOSIS — M6281 Muscle weakness (generalized): Secondary | ICD-10-CM | POA: Diagnosis not present

## 2021-01-13 DIAGNOSIS — I509 Heart failure, unspecified: Secondary | ICD-10-CM | POA: Diagnosis not present

## 2021-01-13 DIAGNOSIS — N184 Chronic kidney disease, stage 4 (severe): Secondary | ICD-10-CM | POA: Diagnosis not present

## 2021-01-13 DIAGNOSIS — J9620 Acute and chronic respiratory failure, unspecified whether with hypoxia or hypercapnia: Secondary | ICD-10-CM | POA: Diagnosis not present

## 2021-01-13 DIAGNOSIS — Z930 Tracheostomy status: Secondary | ICD-10-CM | POA: Diagnosis not present

## 2021-01-13 DIAGNOSIS — I130 Hypertensive heart and chronic kidney disease with heart failure and stage 1 through stage 4 chronic kidney disease, or unspecified chronic kidney disease: Secondary | ICD-10-CM | POA: Diagnosis not present

## 2021-01-13 DIAGNOSIS — I4891 Unspecified atrial fibrillation: Secondary | ICD-10-CM | POA: Diagnosis not present

## 2021-01-18 DIAGNOSIS — Z930 Tracheostomy status: Secondary | ICD-10-CM | POA: Diagnosis not present

## 2021-01-18 DIAGNOSIS — I130 Hypertensive heart and chronic kidney disease with heart failure and stage 1 through stage 4 chronic kidney disease, or unspecified chronic kidney disease: Secondary | ICD-10-CM | POA: Diagnosis not present

## 2021-01-18 DIAGNOSIS — I509 Heart failure, unspecified: Secondary | ICD-10-CM | POA: Diagnosis not present

## 2021-01-18 DIAGNOSIS — J9620 Acute and chronic respiratory failure, unspecified whether with hypoxia or hypercapnia: Secondary | ICD-10-CM | POA: Diagnosis not present

## 2021-01-18 DIAGNOSIS — M6281 Muscle weakness (generalized): Secondary | ICD-10-CM | POA: Diagnosis not present

## 2021-01-18 DIAGNOSIS — N184 Chronic kidney disease, stage 4 (severe): Secondary | ICD-10-CM | POA: Diagnosis not present

## 2021-01-18 DIAGNOSIS — I4891 Unspecified atrial fibrillation: Secondary | ICD-10-CM | POA: Diagnosis not present

## 2021-01-19 DIAGNOSIS — Z939 Artificial opening status, unspecified: Secondary | ICD-10-CM | POA: Diagnosis not present

## 2021-01-19 DIAGNOSIS — I10 Essential (primary) hypertension: Secondary | ICD-10-CM | POA: Diagnosis not present

## 2021-01-19 DIAGNOSIS — Z789 Other specified health status: Secondary | ICD-10-CM | POA: Diagnosis not present

## 2021-01-19 DIAGNOSIS — I509 Heart failure, unspecified: Secondary | ICD-10-CM | POA: Diagnosis not present

## 2021-01-19 DIAGNOSIS — I130 Hypertensive heart and chronic kidney disease with heart failure and stage 1 through stage 4 chronic kidney disease, or unspecified chronic kidney disease: Secondary | ICD-10-CM | POA: Diagnosis not present

## 2021-01-19 DIAGNOSIS — Z299 Encounter for prophylactic measures, unspecified: Secondary | ICD-10-CM | POA: Diagnosis not present

## 2021-01-19 DIAGNOSIS — J9620 Acute and chronic respiratory failure, unspecified whether with hypoxia or hypercapnia: Secondary | ICD-10-CM | POA: Diagnosis not present

## 2021-01-19 DIAGNOSIS — M6281 Muscle weakness (generalized): Secondary | ICD-10-CM | POA: Diagnosis not present

## 2021-01-21 DIAGNOSIS — Z930 Tracheostomy status: Secondary | ICD-10-CM | POA: Diagnosis not present

## 2021-01-21 DIAGNOSIS — I509 Heart failure, unspecified: Secondary | ICD-10-CM | POA: Diagnosis not present

## 2021-01-21 DIAGNOSIS — N184 Chronic kidney disease, stage 4 (severe): Secondary | ICD-10-CM | POA: Diagnosis not present

## 2021-01-21 DIAGNOSIS — I130 Hypertensive heart and chronic kidney disease with heart failure and stage 1 through stage 4 chronic kidney disease, or unspecified chronic kidney disease: Secondary | ICD-10-CM | POA: Diagnosis not present

## 2021-01-21 DIAGNOSIS — I4891 Unspecified atrial fibrillation: Secondary | ICD-10-CM | POA: Diagnosis not present

## 2021-01-21 DIAGNOSIS — J9620 Acute and chronic respiratory failure, unspecified whether with hypoxia or hypercapnia: Secondary | ICD-10-CM | POA: Diagnosis not present

## 2021-01-21 DIAGNOSIS — M6281 Muscle weakness (generalized): Secondary | ICD-10-CM | POA: Diagnosis not present

## 2021-01-24 DIAGNOSIS — Z299 Encounter for prophylactic measures, unspecified: Secondary | ICD-10-CM | POA: Diagnosis not present

## 2021-01-24 DIAGNOSIS — I10 Essential (primary) hypertension: Secondary | ICD-10-CM | POA: Diagnosis not present

## 2021-01-24 DIAGNOSIS — I509 Heart failure, unspecified: Secondary | ICD-10-CM | POA: Diagnosis not present

## 2021-01-24 DIAGNOSIS — E7800 Pure hypercholesterolemia, unspecified: Secondary | ICD-10-CM | POA: Diagnosis not present

## 2021-01-24 DIAGNOSIS — I4891 Unspecified atrial fibrillation: Secondary | ICD-10-CM | POA: Diagnosis not present

## 2021-01-25 DIAGNOSIS — I130 Hypertensive heart and chronic kidney disease with heart failure and stage 1 through stage 4 chronic kidney disease, or unspecified chronic kidney disease: Secondary | ICD-10-CM | POA: Diagnosis not present

## 2021-01-25 DIAGNOSIS — I4891 Unspecified atrial fibrillation: Secondary | ICD-10-CM | POA: Diagnosis not present

## 2021-01-25 DIAGNOSIS — M6281 Muscle weakness (generalized): Secondary | ICD-10-CM | POA: Diagnosis not present

## 2021-01-25 DIAGNOSIS — Z930 Tracheostomy status: Secondary | ICD-10-CM | POA: Diagnosis not present

## 2021-01-25 DIAGNOSIS — I509 Heart failure, unspecified: Secondary | ICD-10-CM | POA: Diagnosis not present

## 2021-01-25 DIAGNOSIS — N184 Chronic kidney disease, stage 4 (severe): Secondary | ICD-10-CM | POA: Diagnosis not present

## 2021-01-25 DIAGNOSIS — J9620 Acute and chronic respiratory failure, unspecified whether with hypoxia or hypercapnia: Secondary | ICD-10-CM | POA: Diagnosis not present

## 2021-01-26 DIAGNOSIS — Z4659 Encounter for fitting and adjustment of other gastrointestinal appliance and device: Secondary | ICD-10-CM | POA: Insufficient documentation

## 2021-01-27 DIAGNOSIS — J9620 Acute and chronic respiratory failure, unspecified whether with hypoxia or hypercapnia: Secondary | ICD-10-CM | POA: Diagnosis not present

## 2021-01-27 DIAGNOSIS — I509 Heart failure, unspecified: Secondary | ICD-10-CM | POA: Diagnosis not present

## 2021-01-27 DIAGNOSIS — Z930 Tracheostomy status: Secondary | ICD-10-CM | POA: Diagnosis not present

## 2021-01-27 DIAGNOSIS — I4891 Unspecified atrial fibrillation: Secondary | ICD-10-CM | POA: Diagnosis not present

## 2021-01-27 DIAGNOSIS — I130 Hypertensive heart and chronic kidney disease with heart failure and stage 1 through stage 4 chronic kidney disease, or unspecified chronic kidney disease: Secondary | ICD-10-CM | POA: Diagnosis not present

## 2021-01-27 DIAGNOSIS — N184 Chronic kidney disease, stage 4 (severe): Secondary | ICD-10-CM | POA: Diagnosis not present

## 2021-01-27 DIAGNOSIS — M6281 Muscle weakness (generalized): Secondary | ICD-10-CM | POA: Diagnosis not present

## 2021-01-31 DIAGNOSIS — I5030 Unspecified diastolic (congestive) heart failure: Secondary | ICD-10-CM | POA: Diagnosis not present

## 2021-01-31 DIAGNOSIS — Z930 Tracheostomy status: Secondary | ICD-10-CM | POA: Diagnosis not present

## 2021-01-31 DIAGNOSIS — G4733 Obstructive sleep apnea (adult) (pediatric): Secondary | ICD-10-CM | POA: Diagnosis not present

## 2021-01-31 DIAGNOSIS — I110 Hypertensive heart disease with heart failure: Secondary | ICD-10-CM | POA: Diagnosis not present

## 2021-01-31 DIAGNOSIS — I4892 Unspecified atrial flutter: Secondary | ICD-10-CM | POA: Diagnosis not present

## 2021-01-31 DIAGNOSIS — Z7901 Long term (current) use of anticoagulants: Secondary | ICD-10-CM | POA: Diagnosis not present

## 2021-01-31 DIAGNOSIS — I4510 Unspecified right bundle-branch block: Secondary | ICD-10-CM | POA: Diagnosis not present

## 2021-02-01 DIAGNOSIS — I4891 Unspecified atrial fibrillation: Secondary | ICD-10-CM | POA: Diagnosis not present

## 2021-02-01 DIAGNOSIS — M6281 Muscle weakness (generalized): Secondary | ICD-10-CM | POA: Diagnosis not present

## 2021-02-01 DIAGNOSIS — I130 Hypertensive heart and chronic kidney disease with heart failure and stage 1 through stage 4 chronic kidney disease, or unspecified chronic kidney disease: Secondary | ICD-10-CM | POA: Diagnosis not present

## 2021-02-01 DIAGNOSIS — I509 Heart failure, unspecified: Secondary | ICD-10-CM | POA: Diagnosis not present

## 2021-02-01 DIAGNOSIS — Z930 Tracheostomy status: Secondary | ICD-10-CM | POA: Diagnosis not present

## 2021-02-01 DIAGNOSIS — N184 Chronic kidney disease, stage 4 (severe): Secondary | ICD-10-CM | POA: Diagnosis not present

## 2021-02-01 DIAGNOSIS — J9620 Acute and chronic respiratory failure, unspecified whether with hypoxia or hypercapnia: Secondary | ICD-10-CM | POA: Diagnosis not present

## 2021-02-02 DIAGNOSIS — I4891 Unspecified atrial fibrillation: Secondary | ICD-10-CM | POA: Diagnosis not present

## 2021-02-02 DIAGNOSIS — I130 Hypertensive heart and chronic kidney disease with heart failure and stage 1 through stage 4 chronic kidney disease, or unspecified chronic kidney disease: Secondary | ICD-10-CM | POA: Diagnosis not present

## 2021-02-02 DIAGNOSIS — N184 Chronic kidney disease, stage 4 (severe): Secondary | ICD-10-CM | POA: Diagnosis not present

## 2021-02-02 DIAGNOSIS — I509 Heart failure, unspecified: Secondary | ICD-10-CM | POA: Diagnosis not present

## 2021-02-02 DIAGNOSIS — Z930 Tracheostomy status: Secondary | ICD-10-CM | POA: Diagnosis not present

## 2021-02-02 DIAGNOSIS — M6281 Muscle weakness (generalized): Secondary | ICD-10-CM | POA: Diagnosis not present

## 2021-02-02 DIAGNOSIS — J9620 Acute and chronic respiratory failure, unspecified whether with hypoxia or hypercapnia: Secondary | ICD-10-CM | POA: Diagnosis not present

## 2021-02-03 DIAGNOSIS — I4891 Unspecified atrial fibrillation: Secondary | ICD-10-CM | POA: Diagnosis not present

## 2021-02-03 DIAGNOSIS — I509 Heart failure, unspecified: Secondary | ICD-10-CM | POA: Diagnosis not present

## 2021-02-03 DIAGNOSIS — J9620 Acute and chronic respiratory failure, unspecified whether with hypoxia or hypercapnia: Secondary | ICD-10-CM | POA: Diagnosis not present

## 2021-02-03 DIAGNOSIS — M6281 Muscle weakness (generalized): Secondary | ICD-10-CM | POA: Diagnosis not present

## 2021-02-03 DIAGNOSIS — Z930 Tracheostomy status: Secondary | ICD-10-CM | POA: Diagnosis not present

## 2021-02-03 DIAGNOSIS — I130 Hypertensive heart and chronic kidney disease with heart failure and stage 1 through stage 4 chronic kidney disease, or unspecified chronic kidney disease: Secondary | ICD-10-CM | POA: Diagnosis not present

## 2021-02-03 DIAGNOSIS — N184 Chronic kidney disease, stage 4 (severe): Secondary | ICD-10-CM | POA: Diagnosis not present

## 2021-02-04 DIAGNOSIS — J9620 Acute and chronic respiratory failure, unspecified whether with hypoxia or hypercapnia: Secondary | ICD-10-CM | POA: Diagnosis not present

## 2021-02-04 DIAGNOSIS — I4892 Unspecified atrial flutter: Secondary | ICD-10-CM | POA: Diagnosis not present

## 2021-02-04 DIAGNOSIS — M6281 Muscle weakness (generalized): Secondary | ICD-10-CM | POA: Diagnosis not present

## 2021-02-09 DIAGNOSIS — J9620 Acute and chronic respiratory failure, unspecified whether with hypoxia or hypercapnia: Secondary | ICD-10-CM | POA: Diagnosis not present

## 2021-02-09 DIAGNOSIS — I130 Hypertensive heart and chronic kidney disease with heart failure and stage 1 through stage 4 chronic kidney disease, or unspecified chronic kidney disease: Secondary | ICD-10-CM | POA: Diagnosis not present

## 2021-02-09 DIAGNOSIS — Z930 Tracheostomy status: Secondary | ICD-10-CM | POA: Diagnosis not present

## 2021-02-09 DIAGNOSIS — M6281 Muscle weakness (generalized): Secondary | ICD-10-CM | POA: Diagnosis not present

## 2021-02-09 DIAGNOSIS — N184 Chronic kidney disease, stage 4 (severe): Secondary | ICD-10-CM | POA: Diagnosis not present

## 2021-02-09 DIAGNOSIS — I4891 Unspecified atrial fibrillation: Secondary | ICD-10-CM | POA: Diagnosis not present

## 2021-02-09 DIAGNOSIS — I509 Heart failure, unspecified: Secondary | ICD-10-CM | POA: Diagnosis not present

## 2021-02-10 DIAGNOSIS — J9620 Acute and chronic respiratory failure, unspecified whether with hypoxia or hypercapnia: Secondary | ICD-10-CM | POA: Diagnosis not present

## 2021-02-10 DIAGNOSIS — M6281 Muscle weakness (generalized): Secondary | ICD-10-CM | POA: Diagnosis not present

## 2021-02-10 DIAGNOSIS — I4891 Unspecified atrial fibrillation: Secondary | ICD-10-CM | POA: Diagnosis not present

## 2021-02-10 DIAGNOSIS — I130 Hypertensive heart and chronic kidney disease with heart failure and stage 1 through stage 4 chronic kidney disease, or unspecified chronic kidney disease: Secondary | ICD-10-CM | POA: Diagnosis not present

## 2021-02-10 DIAGNOSIS — N184 Chronic kidney disease, stage 4 (severe): Secondary | ICD-10-CM | POA: Diagnosis not present

## 2021-02-10 DIAGNOSIS — I509 Heart failure, unspecified: Secondary | ICD-10-CM | POA: Diagnosis not present

## 2021-02-10 DIAGNOSIS — Z930 Tracheostomy status: Secondary | ICD-10-CM | POA: Diagnosis not present

## 2021-02-14 DIAGNOSIS — I509 Heart failure, unspecified: Secondary | ICD-10-CM | POA: Diagnosis not present

## 2021-02-14 DIAGNOSIS — N184 Chronic kidney disease, stage 4 (severe): Secondary | ICD-10-CM | POA: Diagnosis not present

## 2021-02-14 DIAGNOSIS — I130 Hypertensive heart and chronic kidney disease with heart failure and stage 1 through stage 4 chronic kidney disease, or unspecified chronic kidney disease: Secondary | ICD-10-CM | POA: Diagnosis not present

## 2021-02-14 DIAGNOSIS — Z930 Tracheostomy status: Secondary | ICD-10-CM | POA: Diagnosis not present

## 2021-02-14 DIAGNOSIS — I4891 Unspecified atrial fibrillation: Secondary | ICD-10-CM | POA: Diagnosis not present

## 2021-02-14 DIAGNOSIS — J9620 Acute and chronic respiratory failure, unspecified whether with hypoxia or hypercapnia: Secondary | ICD-10-CM | POA: Diagnosis not present

## 2021-02-14 DIAGNOSIS — M6281 Muscle weakness (generalized): Secondary | ICD-10-CM | POA: Diagnosis not present

## 2021-02-15 DIAGNOSIS — N184 Chronic kidney disease, stage 4 (severe): Secondary | ICD-10-CM | POA: Diagnosis not present

## 2021-02-15 DIAGNOSIS — I509 Heart failure, unspecified: Secondary | ICD-10-CM | POA: Diagnosis not present

## 2021-02-15 DIAGNOSIS — Z930 Tracheostomy status: Secondary | ICD-10-CM | POA: Diagnosis not present

## 2021-02-15 DIAGNOSIS — I130 Hypertensive heart and chronic kidney disease with heart failure and stage 1 through stage 4 chronic kidney disease, or unspecified chronic kidney disease: Secondary | ICD-10-CM | POA: Diagnosis not present

## 2021-02-15 DIAGNOSIS — I4891 Unspecified atrial fibrillation: Secondary | ICD-10-CM | POA: Diagnosis not present

## 2021-02-15 DIAGNOSIS — M6281 Muscle weakness (generalized): Secondary | ICD-10-CM | POA: Diagnosis not present

## 2021-02-15 DIAGNOSIS — J9620 Acute and chronic respiratory failure, unspecified whether with hypoxia or hypercapnia: Secondary | ICD-10-CM | POA: Diagnosis not present

## 2021-02-22 DIAGNOSIS — J9620 Acute and chronic respiratory failure, unspecified whether with hypoxia or hypercapnia: Secondary | ICD-10-CM | POA: Diagnosis not present

## 2021-02-22 DIAGNOSIS — Z930 Tracheostomy status: Secondary | ICD-10-CM | POA: Diagnosis not present

## 2021-02-22 DIAGNOSIS — I130 Hypertensive heart and chronic kidney disease with heart failure and stage 1 through stage 4 chronic kidney disease, or unspecified chronic kidney disease: Secondary | ICD-10-CM | POA: Diagnosis not present

## 2021-02-22 DIAGNOSIS — I509 Heart failure, unspecified: Secondary | ICD-10-CM | POA: Diagnosis not present

## 2021-02-22 DIAGNOSIS — I4891 Unspecified atrial fibrillation: Secondary | ICD-10-CM | POA: Diagnosis not present

## 2021-02-22 DIAGNOSIS — M6281 Muscle weakness (generalized): Secondary | ICD-10-CM | POA: Diagnosis not present

## 2021-02-22 DIAGNOSIS — N184 Chronic kidney disease, stage 4 (severe): Secondary | ICD-10-CM | POA: Diagnosis not present

## 2021-02-23 DIAGNOSIS — I509 Heart failure, unspecified: Secondary | ICD-10-CM | POA: Diagnosis not present

## 2021-02-23 DIAGNOSIS — J9620 Acute and chronic respiratory failure, unspecified whether with hypoxia or hypercapnia: Secondary | ICD-10-CM | POA: Diagnosis not present

## 2021-02-23 DIAGNOSIS — I130 Hypertensive heart and chronic kidney disease with heart failure and stage 1 through stage 4 chronic kidney disease, or unspecified chronic kidney disease: Secondary | ICD-10-CM | POA: Diagnosis not present

## 2021-02-23 DIAGNOSIS — Z930 Tracheostomy status: Secondary | ICD-10-CM | POA: Diagnosis not present

## 2021-02-23 DIAGNOSIS — I4891 Unspecified atrial fibrillation: Secondary | ICD-10-CM | POA: Diagnosis not present

## 2021-02-23 DIAGNOSIS — N184 Chronic kidney disease, stage 4 (severe): Secondary | ICD-10-CM | POA: Diagnosis not present

## 2021-02-23 DIAGNOSIS — M6281 Muscle weakness (generalized): Secondary | ICD-10-CM | POA: Diagnosis not present

## 2021-02-25 ENCOUNTER — Other Ambulatory Visit: Payer: Self-pay

## 2021-02-25 ENCOUNTER — Encounter: Payer: Self-pay | Admitting: Podiatry

## 2021-02-25 ENCOUNTER — Ambulatory Visit: Payer: Medicare Other | Admitting: Podiatry

## 2021-02-25 DIAGNOSIS — M79675 Pain in left toe(s): Secondary | ICD-10-CM | POA: Diagnosis not present

## 2021-02-25 DIAGNOSIS — M79674 Pain in right toe(s): Secondary | ICD-10-CM

## 2021-02-25 DIAGNOSIS — B351 Tinea unguium: Secondary | ICD-10-CM | POA: Diagnosis not present

## 2021-02-25 DIAGNOSIS — I5030 Unspecified diastolic (congestive) heart failure: Secondary | ICD-10-CM | POA: Insufficient documentation

## 2021-02-25 NOTE — Progress Notes (Signed)
This patient returns to my office for at risk foot care.  This patient requires this care by a professional since this patient will be at risk due to having kidney injury and CKD and coagulation defect.  Patient is taking eliquis.    This patient is unable to cut nails himself since the patient cannot reach his nails.These nails are painful walking and wearing shoes.  This patient presents for at risk foot care today.  General Appearance  Alert, conversant and in no acute stress.  Vascular  Dorsalis pedis and posterior tibial  pulses are  not palpable  bilaterally.  Capillary return is within normal limits  bilaterally. Temperature is within normal limits  bilaterally.  Neurologic  Senn-Weinstein monofilament wire test within normal limits  bilaterally. Muscle power within normal limits bilaterally.  Nails Thick disfigured discolored nails with subungual debris  from hallux to fifth toes bilaterally. No evidence of bacterial infection or drainage bilaterally.  Orthopedic  No limitations of motion  feet .  No crepitus or effusions noted.  No bony pathology or digital deformities noted.  Skin  normotropic skin with no porokeratosis noted bilaterally.  No signs of infections or ulcers noted.     Onychomycosis  Pain in right toes  Pain in left toes  Consent was obtained for treatment procedures.  IE.     Mechanical debridement of nails 1-5  bilaterally performed with a nail nipper.  Filed with dremel without incident.    Return office visit   3 months                  Told patient to return for periodic foot care and evaluation due to potential at risk complications.   Gardiner Barefoot DPM

## 2021-03-01 DIAGNOSIS — M6281 Muscle weakness (generalized): Secondary | ICD-10-CM | POA: Diagnosis not present

## 2021-03-01 DIAGNOSIS — R110 Nausea: Secondary | ICD-10-CM | POA: Diagnosis not present

## 2021-03-01 DIAGNOSIS — Z930 Tracheostomy status: Secondary | ICD-10-CM | POA: Diagnosis not present

## 2021-03-01 DIAGNOSIS — N184 Chronic kidney disease, stage 4 (severe): Secondary | ICD-10-CM | POA: Diagnosis not present

## 2021-03-01 DIAGNOSIS — R519 Headache, unspecified: Secondary | ICD-10-CM | POA: Diagnosis not present

## 2021-03-01 DIAGNOSIS — I509 Heart failure, unspecified: Secondary | ICD-10-CM | POA: Diagnosis not present

## 2021-03-01 DIAGNOSIS — Z20822 Contact with and (suspected) exposure to covid-19: Secondary | ICD-10-CM | POA: Diagnosis not present

## 2021-03-01 DIAGNOSIS — U071 COVID-19: Secondary | ICD-10-CM | POA: Diagnosis not present

## 2021-03-01 DIAGNOSIS — J9620 Acute and chronic respiratory failure, unspecified whether with hypoxia or hypercapnia: Secondary | ICD-10-CM | POA: Diagnosis not present

## 2021-03-01 DIAGNOSIS — I130 Hypertensive heart and chronic kidney disease with heart failure and stage 1 through stage 4 chronic kidney disease, or unspecified chronic kidney disease: Secondary | ICD-10-CM | POA: Diagnosis not present

## 2021-03-01 DIAGNOSIS — I4891 Unspecified atrial fibrillation: Secondary | ICD-10-CM | POA: Diagnosis not present

## 2021-03-06 DIAGNOSIS — M6281 Muscle weakness (generalized): Secondary | ICD-10-CM | POA: Diagnosis not present

## 2021-03-06 DIAGNOSIS — I4892 Unspecified atrial flutter: Secondary | ICD-10-CM | POA: Diagnosis not present

## 2021-03-06 DIAGNOSIS — J9620 Acute and chronic respiratory failure, unspecified whether with hypoxia or hypercapnia: Secondary | ICD-10-CM | POA: Diagnosis not present

## 2021-03-23 DIAGNOSIS — I509 Heart failure, unspecified: Secondary | ICD-10-CM | POA: Diagnosis not present

## 2021-03-24 DIAGNOSIS — I483 Typical atrial flutter: Secondary | ICD-10-CM | POA: Diagnosis not present

## 2021-03-24 DIAGNOSIS — I5032 Chronic diastolic (congestive) heart failure: Secondary | ICD-10-CM | POA: Diagnosis not present

## 2021-03-24 DIAGNOSIS — I10 Essential (primary) hypertension: Secondary | ICD-10-CM | POA: Diagnosis not present

## 2021-04-06 DIAGNOSIS — I4892 Unspecified atrial flutter: Secondary | ICD-10-CM | POA: Diagnosis not present

## 2021-04-06 DIAGNOSIS — M6281 Muscle weakness (generalized): Secondary | ICD-10-CM | POA: Diagnosis not present

## 2021-04-06 DIAGNOSIS — J9620 Acute and chronic respiratory failure, unspecified whether with hypoxia or hypercapnia: Secondary | ICD-10-CM | POA: Diagnosis not present

## 2021-04-13 DIAGNOSIS — I509 Heart failure, unspecified: Secondary | ICD-10-CM | POA: Diagnosis not present

## 2021-04-13 DIAGNOSIS — U071 COVID-19: Secondary | ICD-10-CM | POA: Diagnosis not present

## 2021-04-13 DIAGNOSIS — Z930 Tracheostomy status: Secondary | ICD-10-CM | POA: Diagnosis not present

## 2021-04-13 DIAGNOSIS — G4733 Obstructive sleep apnea (adult) (pediatric): Secondary | ICD-10-CM | POA: Diagnosis not present

## 2021-04-23 DIAGNOSIS — I509 Heart failure, unspecified: Secondary | ICD-10-CM | POA: Diagnosis not present

## 2021-04-27 DIAGNOSIS — Z0000 Encounter for general adult medical examination without abnormal findings: Secondary | ICD-10-CM | POA: Diagnosis not present

## 2021-04-27 DIAGNOSIS — Z7189 Other specified counseling: Secondary | ICD-10-CM | POA: Diagnosis not present

## 2021-04-27 DIAGNOSIS — E7800 Pure hypercholesterolemia, unspecified: Secondary | ICD-10-CM | POA: Diagnosis not present

## 2021-04-27 DIAGNOSIS — R5383 Other fatigue: Secondary | ICD-10-CM | POA: Diagnosis not present

## 2021-04-27 DIAGNOSIS — M109 Gout, unspecified: Secondary | ICD-10-CM | POA: Diagnosis not present

## 2021-04-27 DIAGNOSIS — D6869 Other thrombophilia: Secondary | ICD-10-CM | POA: Diagnosis not present

## 2021-04-27 DIAGNOSIS — Z299 Encounter for prophylactic measures, unspecified: Secondary | ICD-10-CM | POA: Diagnosis not present

## 2021-04-27 DIAGNOSIS — I509 Heart failure, unspecified: Secondary | ICD-10-CM | POA: Diagnosis not present

## 2021-04-27 DIAGNOSIS — Z79899 Other long term (current) drug therapy: Secondary | ICD-10-CM | POA: Diagnosis not present

## 2021-05-05 DIAGNOSIS — G4733 Obstructive sleep apnea (adult) (pediatric): Secondary | ICD-10-CM | POA: Diagnosis not present

## 2021-05-05 DIAGNOSIS — I509 Heart failure, unspecified: Secondary | ICD-10-CM | POA: Diagnosis not present

## 2021-05-05 DIAGNOSIS — Z930 Tracheostomy status: Secondary | ICD-10-CM | POA: Diagnosis not present

## 2021-05-11 IMAGING — DX DG CHEST 1V PORT
1 series · 1 of 1 positions shown · non-contrast
Comparison: 11/02/2020

CLINICAL DATA: Respiratory failure

EXAM:
PORTABLE CHEST 1 VIEW

[chest ap]
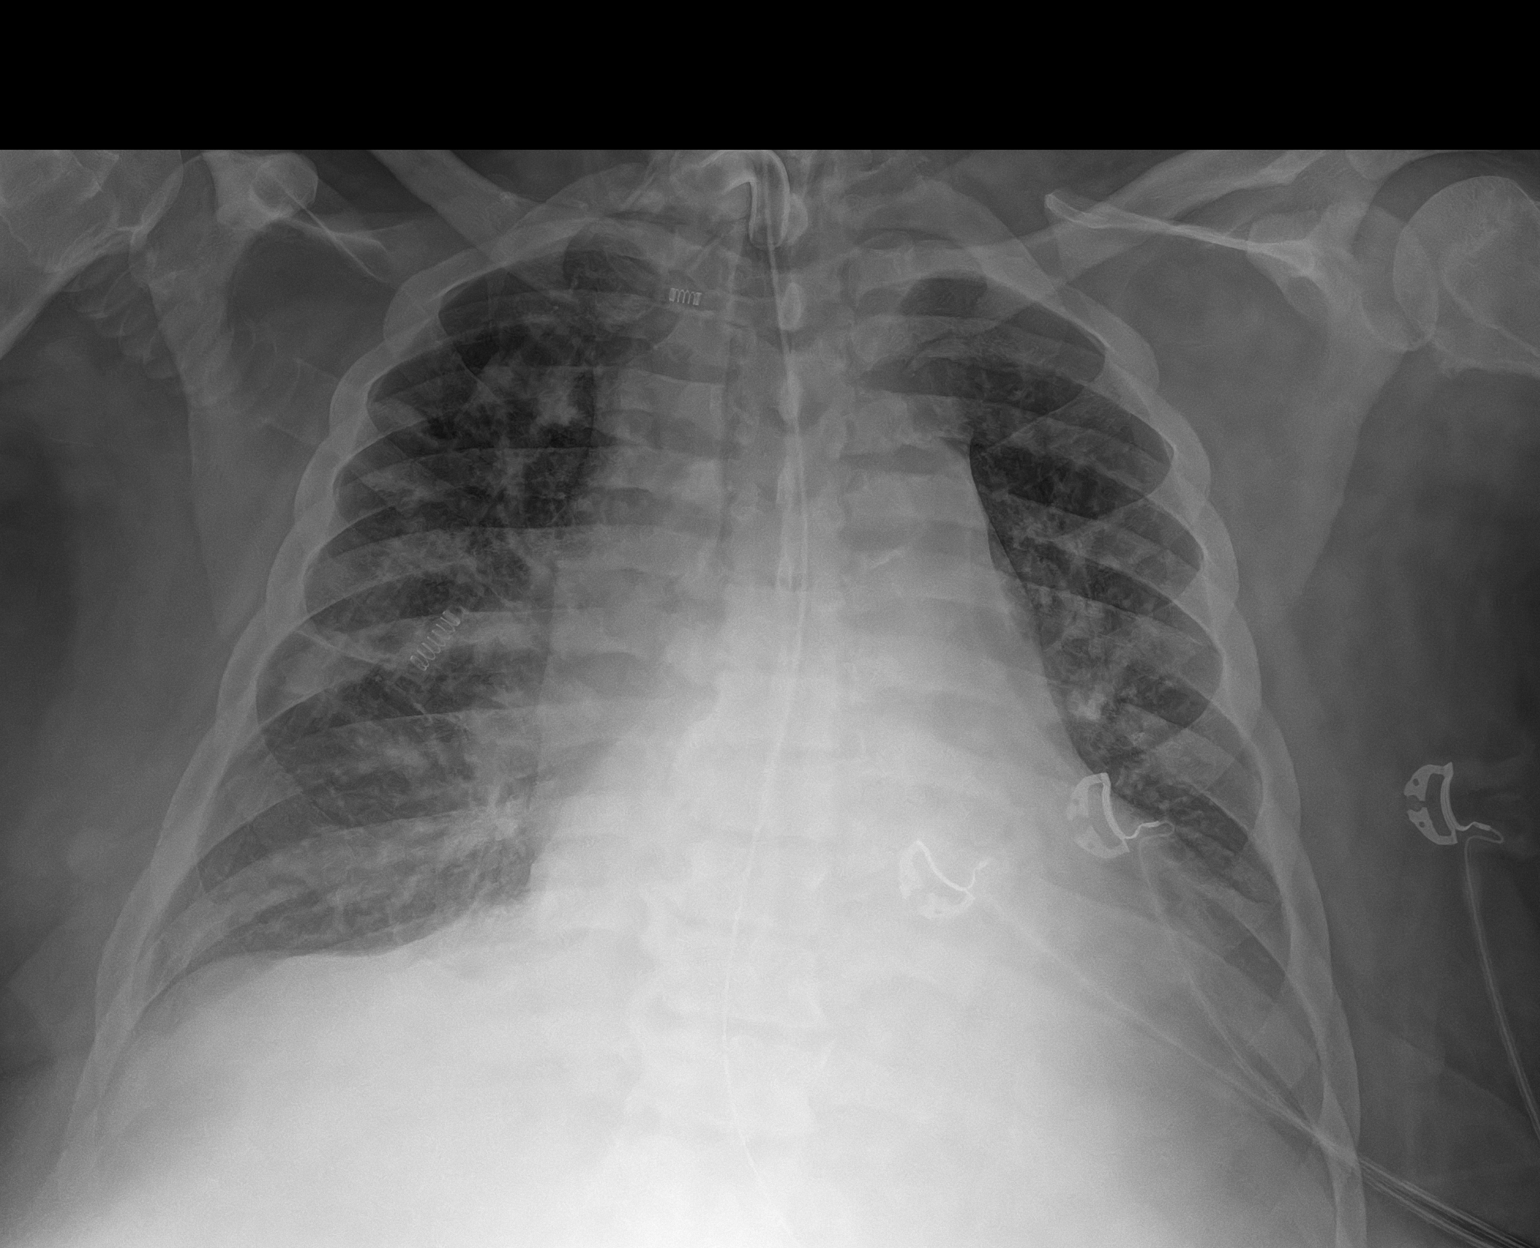

[1 of 1 positions shown; findings below may reference images not displayed]

FINDINGS: Tracheostomy and NG tube are unchanged. Cardiomegaly. Mild vascular
congestion. Bilateral airspace opacities again noted, worse at the
left base. This is stable since prior study. No visible effusions or
pneumothorax.
IMPRESSION: Cardiomegaly.

Bilateral airspace disease, most pronounced in the left base, stable
since prior study.

## 2021-05-14 IMAGING — CT CT ABDOMEN W/O CM
3 of 5 series · 15 of 46 positions shown, 17 images · non-contrast
Comparison: Prior CT scan of the abdomen and pelvis 06/12/2018

CLINICAL DATA: 54-year-old male undergoing anatomic evaluation for
possible percutaneous gastrostomy tube placement.

EXAM:
CT ABDOMEN WITHOUT CONTRAST
TECHNIQUE: Multidetector CT imaging of the abdomen was performed following the
standard protocol without IV contrast.

[Series 3: axial st · axial · 0.98mm/px · z∈[-260,-15]mm · 9 of 63 slices shown, 11 images (1 of 2)]
[im 7/63  soft-tissue]
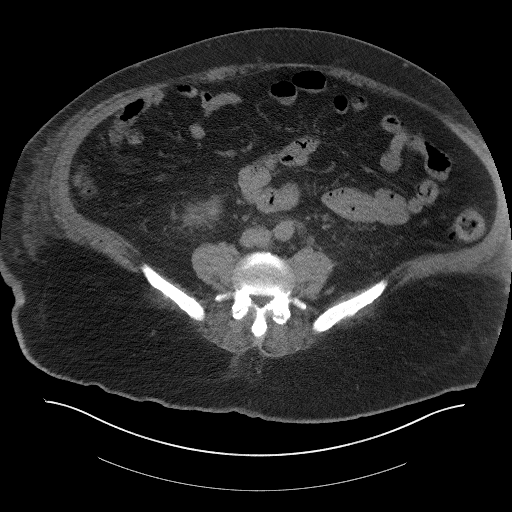
[im 7/63  bone]
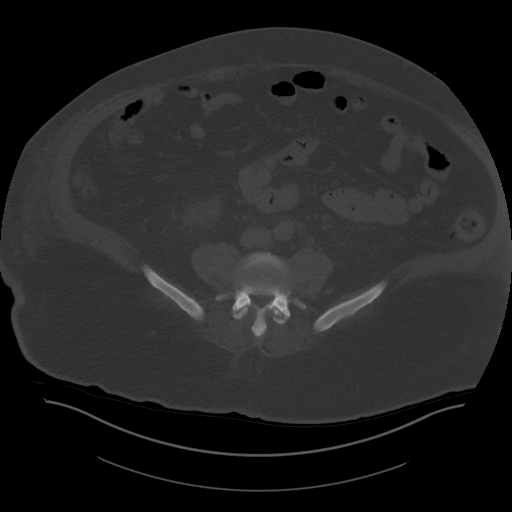
[im 13/63  soft-tissue]
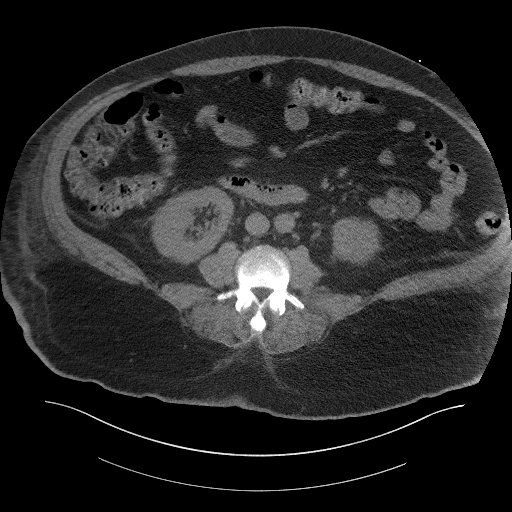
[im 19/63  soft-tissue]
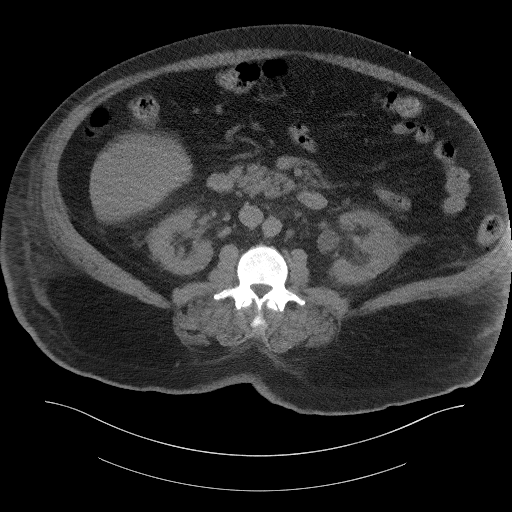
[im 25/63  soft-tissue]
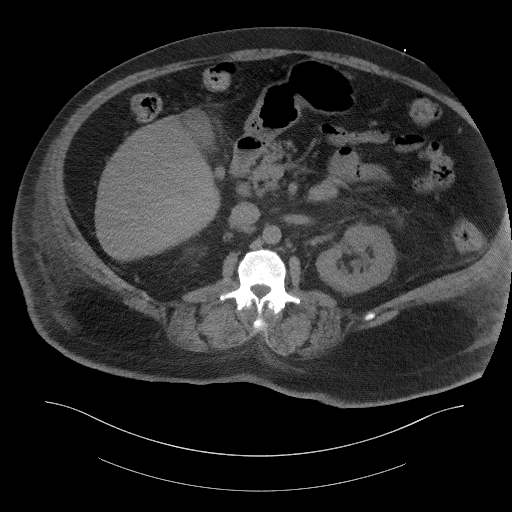
[im 32/63  soft-tissue]
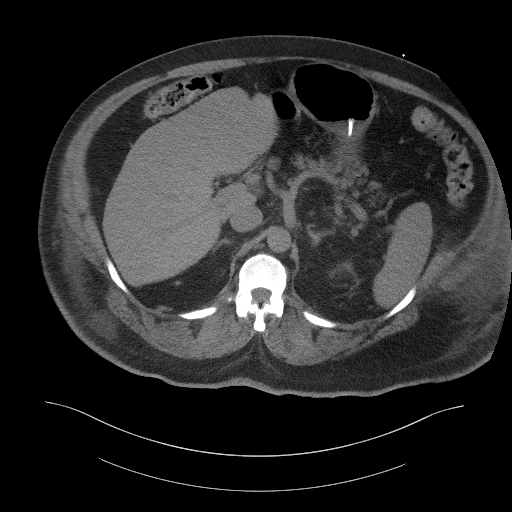
[im 38/63  soft-tissue]
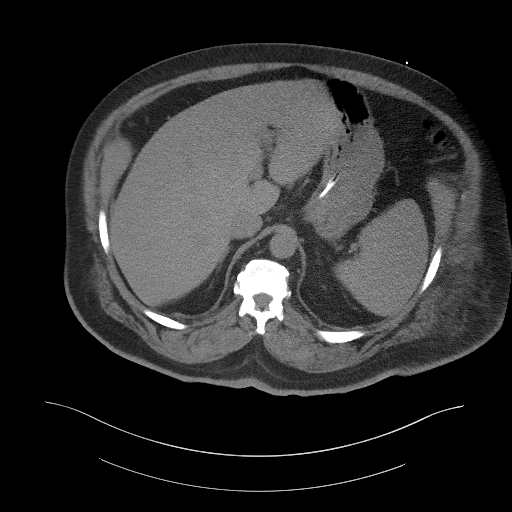
[im 44/63  soft-tissue]
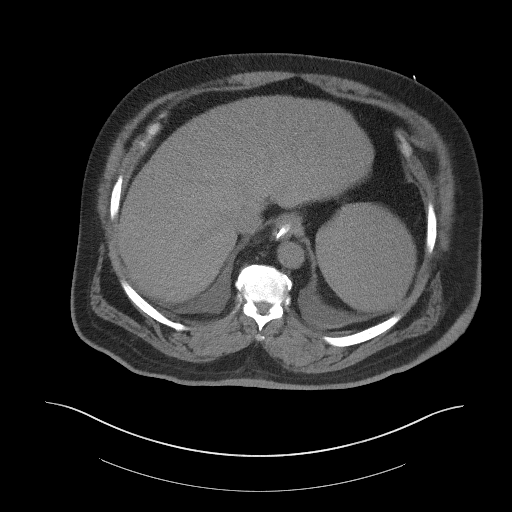
[im 50/63  soft-tissue]
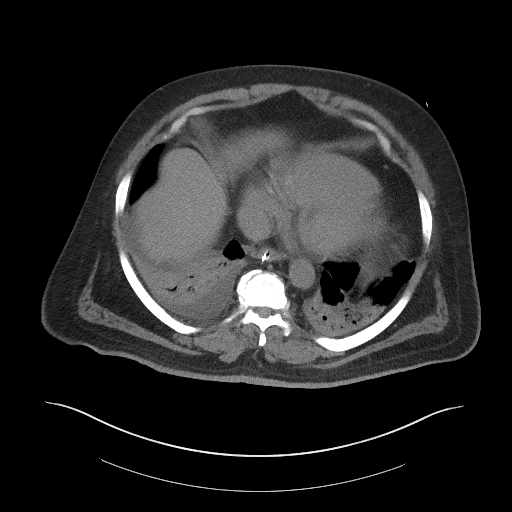
[im 56/63  soft-tissue]
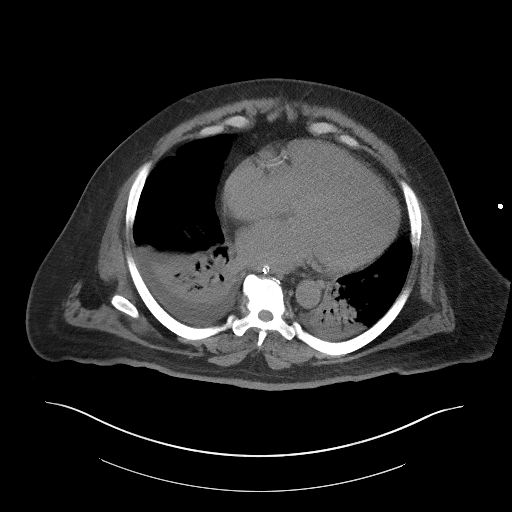
[im 56/63  bone]
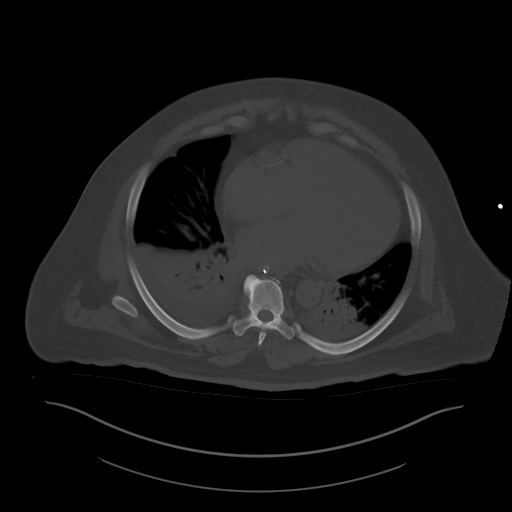

[Series 6: coronal st · coronal · 0.61mm/px · 3 of 119 slices shown]
[im 40/119  soft-tissue]
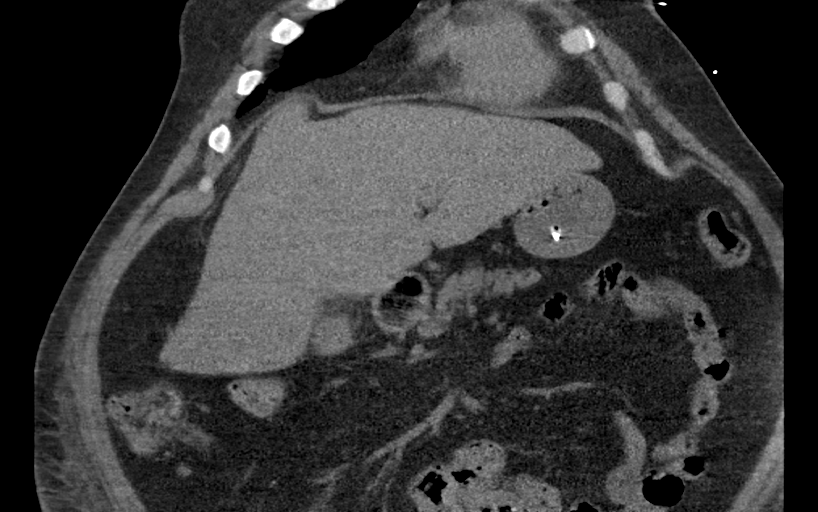
[im 53/119  soft-tissue]
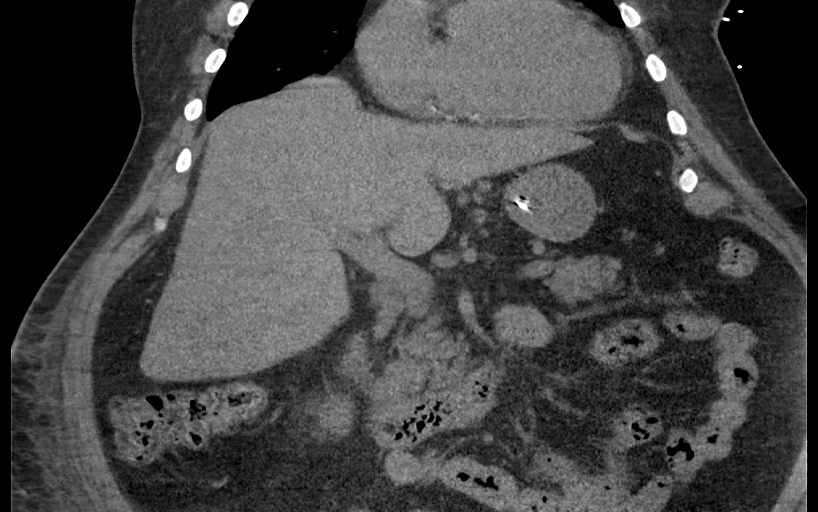
[im 66/119  soft-tissue]
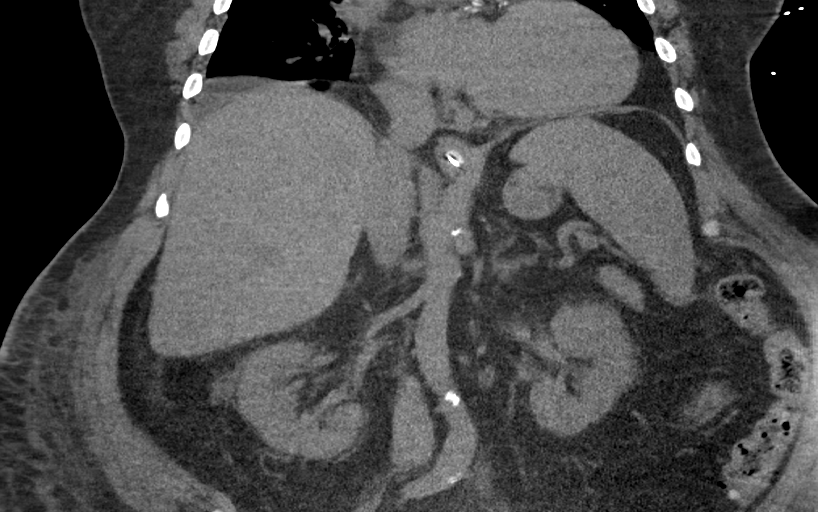

[Series 8: axial st · axial · 1.27mm/px · z∈[-260,-200]mm · 3 of 63 slices shown (2 of 2)]
[im 7/63  soft-tissue]
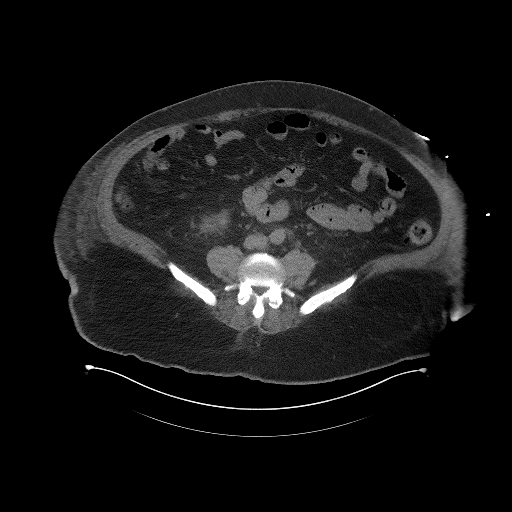
[im 13/63  soft-tissue]
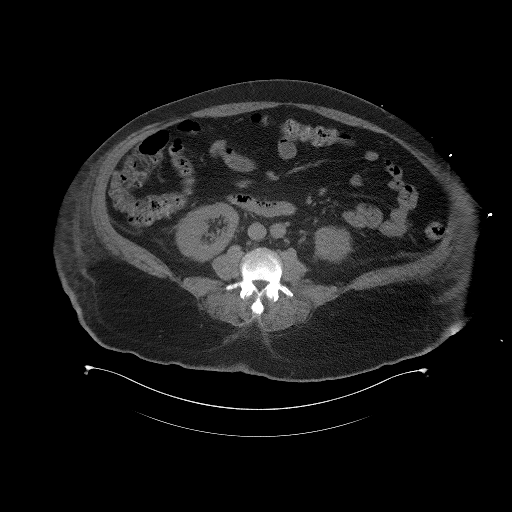
[im 19/63  soft-tissue]
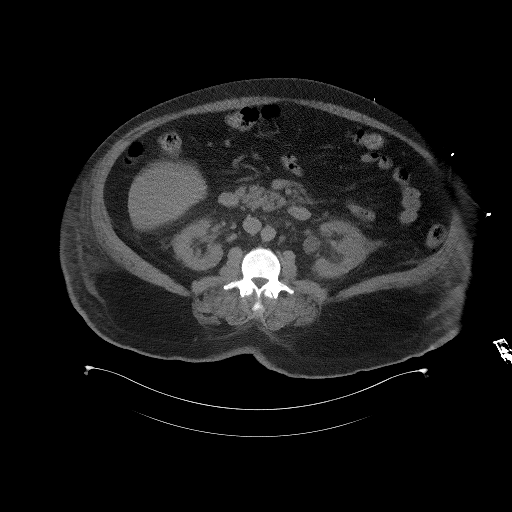

[15 of 46 positions shown; findings below may reference images not displayed]

FINDINGS: Lower chest: Small right and trace left pleural effusions. Small
cm nodule in the right middle lobe, unchanged compared to prior
imaging dating back to 4934 and therefore benign. Combination of
volume loss/atelectasis and consolidative changes present in both
lower lobes. Cardiomegaly. No pericardial effusion. Calcifications
present along the coronary arteries.

Hepatobiliary: Nodular hepatic contour with relative hypertrophy of
the left hepatic lobe. These findings are morphologically compatible
with cirrhosis. No definite discrete mass within the limitations of
noncontrast enhanced technique. Gallbladder is unremarkable. No
intra or extrahepatic biliary ductal dilatation.

Pancreas: Unremarkable. No pancreatic ductal dilatation or
surrounding inflammatory changes.

Spleen: Normal in size without focal abnormality.

Adrenals/Urinary Tract: Unremarkable adrenal glands. Nonspecific
perinephric stranding bilaterally. No hydronephrosis or
nephrolithiasis.

Stomach/Bowel: Gastric tube terminates in the stomach. Normal
anatomic position of the stomach without evidence of colonic
interposition or other complicating feature. Colonic diverticular
disease without CT evidence of active inflammation.

Vascular/Lymphatic: Limited evaluation in the absence of intravenous
contrast. Mild scattered atherosclerotic vascular calcifications. No
aneurysm. No suspicious lymphadenopathy.

Other: No evidence of ascites.

Musculoskeletal: No acute fracture or aggressive appearing lytic or
blastic osseous lesion.
IMPRESSION: 1. Anatomy is suitable for percutaneous gastrostomy tube placement.
2. Small right and trace left pleural effusions with associated
bibasilar atelectasis and consolidation which may reflect pneumonia
or aspiration.
3. Cardiomegaly.
4. Coronary artery calcifications.
5. Hepatic cirrhosis.
6. Colonic diverticular disease without CT evidence of active
inflammation.
7.  Aortic Atherosclerosis (A9LI6-403.3).

## 2021-05-30 ENCOUNTER — Ambulatory Visit (INDEPENDENT_AMBULATORY_CARE_PROVIDER_SITE_OTHER): Payer: 59 | Admitting: Podiatry

## 2021-05-30 ENCOUNTER — Other Ambulatory Visit: Payer: Self-pay

## 2021-05-30 ENCOUNTER — Encounter: Payer: Self-pay | Admitting: Podiatry

## 2021-05-30 DIAGNOSIS — N179 Acute kidney failure, unspecified: Secondary | ICD-10-CM

## 2021-05-30 DIAGNOSIS — M79675 Pain in left toe(s): Secondary | ICD-10-CM

## 2021-05-30 DIAGNOSIS — B351 Tinea unguium: Secondary | ICD-10-CM

## 2021-05-30 DIAGNOSIS — M79674 Pain in right toe(s): Secondary | ICD-10-CM

## 2021-05-30 DIAGNOSIS — N184 Chronic kidney disease, stage 4 (severe): Secondary | ICD-10-CM

## 2021-05-30 NOTE — Progress Notes (Signed)
This patient returns to my office for at risk foot care.  This patient requires this care by a professional since this patient will be at risk due to having kidney injury and CKD and coagulation defect.  Patient is taking eliquis.    This patient is unable to cut nails himself since the patient cannot reach his nails.These nails are painful walking and wearing shoes.  This patient presents for at risk foot care today.  General Appearance  Alert, conversant and in no acute stress.  Vascular  Dorsalis pedis and posterior tibial  pulses are  not palpable  bilaterally.  Capillary return is within normal limits  bilaterally. Temperature is within normal limits  bilaterally.  Neurologic  Senn-Weinstein monofilament wire test within normal limits  bilaterally. Muscle power within normal limits bilaterally.  Nails Thick disfigured discolored nails with subungual debris  from hallux to fifth toes bilaterally. No evidence of bacterial infection or drainage bilaterally.  Orthopedic  No limitations of motion  feet .  No crepitus or effusions noted.  No bony pathology or digital deformities noted.  Skin  normotropic skin with no porokeratosis noted bilaterally.  No signs of infections or ulcers noted.     Onychomycosis  Pain in right toes  Pain in left toes  Consent was obtained for treatment procedures.  IE.     Mechanical debridement of nails 1-5  bilaterally performed with a nail nipper.  Filed with dremel without incident.    Return office visit   3 months                  Told patient to return for periodic foot care and evaluation due to potential at risk complications.   Gardiner Barefoot DPM

## 2021-06-01 ENCOUNTER — Ambulatory Visit: Payer: Medicare Other | Admitting: Podiatry

## 2021-06-22 ENCOUNTER — Other Ambulatory Visit: Payer: Self-pay

## 2021-06-22 ENCOUNTER — Other Ambulatory Visit (HOSPITAL_COMMUNITY)
Admission: RE | Admit: 2021-06-22 | Discharge: 2021-06-22 | Disposition: A | Discharge status: Home / Self Care | Payer: 59 | Source: Ambulatory Visit | Attending: Nephrology | Admitting: Nephrology

## 2021-06-22 DIAGNOSIS — N1832 Chronic kidney disease, stage 3b: Secondary | ICD-10-CM | POA: Diagnosis present

## 2021-06-22 DIAGNOSIS — R809 Proteinuria, unspecified: Secondary | ICD-10-CM | POA: Diagnosis present

## 2021-06-22 DIAGNOSIS — I129 Hypertensive chronic kidney disease with stage 1 through stage 4 chronic kidney disease, or unspecified chronic kidney disease: Secondary | ICD-10-CM | POA: Insufficient documentation

## 2021-06-22 DIAGNOSIS — R0602 Shortness of breath: Secondary | ICD-10-CM | POA: Diagnosis present

## 2021-06-22 LAB — RENAL FUNCTION PANEL
Albumin: 3.5 g/dL (ref 3.5–5.0)
Anion gap: 7 (ref 5–15)
BUN: 41 mg/dL — ABNORMAL HIGH (ref 6–20)
CO2: 27 mmol/L (ref 22–32)
Calcium: 8.7 mg/dL — ABNORMAL LOW (ref 8.9–10.3)
Chloride: 102 mmol/L (ref 98–111)
Creatinine, Ser: 2.54 mg/dL — ABNORMAL HIGH (ref 0.61–1.24)
GFR, Estimated: 29 mL/min — ABNORMAL LOW (ref >60)
Glucose, Bld: 100 mg/dL — ABNORMAL HIGH (ref 70–99)
Phosphorus: 3.8 mg/dL (ref 2.5–4.6)
Potassium: 4.1 mmol/L (ref 3.5–5.1)
Sodium: 136 mmol/L (ref 135–145)

## 2021-06-22 LAB — CBC
HCT: 50.7 % (ref 39.0–52.0)
Hemoglobin: 16.2 g/dL (ref 13.0–17.0)
MCH: 30.3 pg (ref 26.0–34.0)
MCHC: 32 g/dL (ref 30.0–36.0)
MCV: 94.8 fL (ref 80.0–100.0)
Platelets: 293 10*3/uL (ref 150–400)
RBC: 5.35 MIL/uL (ref 4.22–5.81)
RDW: 14 % (ref 11.5–15.5)
WBC: 5 10*3/uL (ref 4.0–10.5)
nRBC: 0 % (ref 0.0–0.2)

## 2021-06-22 LAB — PROTEIN / CREATININE RATIO, URINE
Creatinine, Urine: 35.86 mg/dL
Protein Creatinine Ratio: 5.16 mg/mg{Cre} — ABNORMAL HIGH (ref 0.00–0.15)
Total Protein, Urine: 185 mg/dL

## 2021-07-27 ENCOUNTER — Other Ambulatory Visit (HOSPITAL_COMMUNITY): Payer: Self-pay | Admitting: Nephrology

## 2021-07-27 DIAGNOSIS — I129 Hypertensive chronic kidney disease with stage 1 through stage 4 chronic kidney disease, or unspecified chronic kidney disease: Secondary | ICD-10-CM

## 2021-07-27 DIAGNOSIS — N2581 Secondary hyperparathyroidism of renal origin: Secondary | ICD-10-CM

## 2021-07-27 DIAGNOSIS — R809 Proteinuria, unspecified: Secondary | ICD-10-CM

## 2021-07-27 DIAGNOSIS — N184 Chronic kidney disease, stage 4 (severe): Secondary | ICD-10-CM

## 2021-08-12 ENCOUNTER — Other Ambulatory Visit: Payer: Self-pay | Admitting: Physician Assistant

## 2021-08-15 ENCOUNTER — Ambulatory Visit (HOSPITAL_COMMUNITY): Admission: RE | Admit: 2021-08-15 | Payer: 59 | Source: Ambulatory Visit

## 2021-08-18 ENCOUNTER — Other Ambulatory Visit: Payer: Self-pay | Admitting: Radiology

## 2021-08-19 ENCOUNTER — Other Ambulatory Visit: Payer: Self-pay

## 2021-08-19 ENCOUNTER — Ambulatory Visit (HOSPITAL_COMMUNITY)
Admission: RE | Admit: 2021-08-19 | Discharge: 2021-08-19 | Disposition: A | Discharge status: Home / Self Care | Payer: 59 | Source: Ambulatory Visit | Attending: Nephrology | Admitting: Nephrology

## 2021-08-19 ENCOUNTER — Encounter (HOSPITAL_COMMUNITY): Payer: Self-pay

## 2021-08-19 DIAGNOSIS — I10 Essential (primary) hypertension: Secondary | ICD-10-CM | POA: Insufficient documentation

## 2021-08-19 DIAGNOSIS — N184 Chronic kidney disease, stage 4 (severe): Secondary | ICD-10-CM | POA: Insufficient documentation

## 2021-08-19 DIAGNOSIS — Z79899 Other long term (current) drug therapy: Secondary | ICD-10-CM | POA: Insufficient documentation

## 2021-08-19 DIAGNOSIS — N032 Chronic nephritic syndrome with diffuse membranous glomerulonephritis: Secondary | ICD-10-CM | POA: Diagnosis not present

## 2021-08-19 DIAGNOSIS — I701 Atherosclerosis of renal artery: Secondary | ICD-10-CM | POA: Insufficient documentation

## 2021-08-19 DIAGNOSIS — E213 Hyperparathyroidism, unspecified: Secondary | ICD-10-CM | POA: Diagnosis not present

## 2021-08-19 DIAGNOSIS — N041 Nephrotic syndrome with focal and segmental glomerular lesions: Secondary | ICD-10-CM | POA: Insufficient documentation

## 2021-08-19 DIAGNOSIS — Z7901 Long term (current) use of anticoagulants: Secondary | ICD-10-CM | POA: Insufficient documentation

## 2021-08-19 DIAGNOSIS — Z800 Family history of malignant neoplasm of digestive organs: Secondary | ICD-10-CM | POA: Diagnosis not present

## 2021-08-19 DIAGNOSIS — N159 Renal tubulo-interstitial disease, unspecified: Secondary | ICD-10-CM | POA: Insufficient documentation

## 2021-08-19 DIAGNOSIS — E785 Hyperlipidemia, unspecified: Secondary | ICD-10-CM | POA: Insufficient documentation

## 2021-08-19 DIAGNOSIS — R809 Proteinuria, unspecified: Secondary | ICD-10-CM

## 2021-08-19 DIAGNOSIS — N170 Acute kidney failure with tubular necrosis: Secondary | ICD-10-CM | POA: Insufficient documentation

## 2021-08-19 DIAGNOSIS — I129 Hypertensive chronic kidney disease with stage 1 through stage 4 chronic kidney disease, or unspecified chronic kidney disease: Secondary | ICD-10-CM

## 2021-08-19 DIAGNOSIS — N2581 Secondary hyperparathyroidism of renal origin: Secondary | ICD-10-CM

## 2021-08-19 LAB — PROTIME-INR
INR: 1 (ref 0.8–1.2)
Prothrombin Time: 13.2 seconds (ref 11.4–15.2)

## 2021-08-19 LAB — CBC
HCT: 46.8 % (ref 39.0–52.0)
Hemoglobin: 14.9 g/dL (ref 13.0–17.0)
MCH: 29.9 pg (ref 26.0–34.0)
MCHC: 31.8 g/dL (ref 30.0–36.0)
MCV: 94 fL (ref 80.0–100.0)
Platelets: 310 10*3/uL (ref 150–400)
RBC: 4.98 MIL/uL (ref 4.22–5.81)
RDW: 13.9 % (ref 11.5–15.5)
WBC: 5.9 10*3/uL (ref 4.0–10.5)
nRBC: 0 % (ref 0.0–0.2)

## 2021-08-19 MED ORDER — GELATIN ABSORBABLE 12-7 MM EX MISC
CUTANEOUS | Status: AC
  Filled 2021-08-19: qty 1

## 2021-08-19 MED ORDER — MIDAZOLAM HCL 2 MG/2ML IJ SOLN
INTRAMUSCULAR | Status: DC | PRN
  Administered 2021-08-19: 1 mg via INTRAVENOUS

## 2021-08-19 MED ORDER — FENTANYL CITRATE (PF) 100 MCG/2ML IJ SOLN
INTRAMUSCULAR | Status: AC
  Filled 2021-08-19: qty 2

## 2021-08-19 MED ORDER — FENTANYL CITRATE (PF) 100 MCG/2ML IJ SOLN
INTRAMUSCULAR | Status: DC | PRN
  Administered 2021-08-19 (×2): 25 ug via INTRAVENOUS

## 2021-08-19 MED ORDER — SODIUM CHLORIDE 0.9 % IV SOLN
INTRAVENOUS | Status: DC | PRN
  Administered 2021-08-19: 10 mL/h via INTRAVENOUS

## 2021-08-19 MED ORDER — LIDOCAINE HCL (PF) 1 % IJ SOLN
INTRAMUSCULAR | Status: AC
  Filled 2021-08-19: qty 30

## 2021-08-19 MED ORDER — SODIUM CHLORIDE 0.9 % IV SOLN: INTRAVENOUS | Status: DC

## 2021-08-19 MED ORDER — MIDAZOLAM HCL 2 MG/2ML IJ SOLN
INTRAMUSCULAR | Status: AC
  Filled 2021-08-19: qty 2

## 2021-08-19 NOTE — Procedures (Signed)
Pre Procedure Dx: Jack Cox Post Procedural Dx: Same  Technically successful US guided biopsy of inferior pole of the left kidney.   EBL: None  No immediate complications.   Ronny Bacon, MD Pager #: [phone]

## 2021-08-19 NOTE — H&P (Signed)
Chief Complaint: Proteinuria. Request is for random renal biopsy  Referring Physician(s): Bhutani,Manpreet S  Supervising Physician: Sandi Mariscal  Patient Status: [facility] - Out-pt  History of Present Illness: Jack Cox is a 55 y.o. male outpatient. History of HTN, HLD, CKD, Hyperparathyroidism. Admitted earlier this year for hypoxia s/p trach and G tube placement and acute on chronic AKI.  Now with worsening proteinuria. Team is requesting a kidney biopsy for further evaluation of possible FSGS.   Currently without any significant complaints. Patient alert and laying in bed, calm and comfortable. Denies any fevers, headache, chest pain, SOB, cough, abdominal pain, nausea, vomiting or bleeding. Return precautions and treatment recommendations and follow-up discussed with the patient who is agreeable with the plan.     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  Medications: Prior to Admission medications   Medication Sig Start Date End Date Taking? Authorizing Provider  allopurinol (ZYLOPRIM) 300 MG tablet Take 300 mg by mouth daily.   Yes [provider]  amLODipine (NORVASC) 5 MG tablet Take 5 mg by mouth daily. 01/06/21  Yes [provider]  calcitRIOL (ROCALTROL) 0.25 MCG capsule Take 0.25 mcg by mouth every Monday, Wednesday, and Friday.   Yes [provider]  Cholecalciferol (VITAMIN D) 50 MCG (2000 UT) tablet Take 2,000 Units by mouth daily.   Yes [provider]  cloNIDine (CATAPRES) 0.3 MG tablet Take 0.3 mg by mouth 3 (three) times daily.   Yes  [provider]  ferrous gluconate (FERGON) 240 (27 FE) MG tablet Take 240 mg by mouth daily.   Yes [provider]  losartan (COZAAR) 25 MG tablet Take 12.5 mg by mouth daily.   Yes [provider]  metoprolol tartrate (LOPRESSOR) 25 MG tablet Place 1 tablet (25 mg total) into feeding tube 2 (two) times daily. Patient taking differently: Take 25 mg by mouth 2 (two) times daily. 11/12/20  Yes Johnson, Clanford L, MD  Multiple Vitamin (MULTIVITAMIN WITH MINERALS) TABS tablet Take 1 tablet by mouth daily.   Yes [provider]  torsemide (DEMADEX) 20 MG tablet Take 20 mg by mouth 2 (two) times daily.   Yes [provider]  albuterol (PROVENTIL) (2.5 MG/3ML) 0.083% nebulizer solution Take 3 mLs (2.5 mg total) by nebulization 2 (two) times daily. Patient not taking: No sig reported 11/12/20   Murlean Iba, MD  apixaban (ELIQUIS) 5 MG TABS tablet Place 1 tablet (5 mg total) into feeding tube 2 (two) times daily. Patient taking differently: Take 5 mg by mouth 2 (two) times daily. 11/12/20   Johnson, Clanford L, MD  chlorhexidine gluconate, MEDLINE KIT, (PERIDEX) 0.12 % solution 15 mLs by Mouth Rinse route 2 (two) times daily. Patient not taking: No sig reported 11/12/20   Irwin Brakeman L, MD  cloNIDine (CATAPRES) 0.2 MG tablet Place 1 tablet (0.2 mg total) into feeding tube 3 (three) times daily. Patient not taking: No sig reported 11/12/20   Irwin Brakeman L, MD  pantoprazole (PROTONIX) 40 MG tablet  Take 40 mg by mouth daily.    [provider]  pantoprazole sodium (PROTONIX) 40 mg/20 mL PACK Place 20 mLs (40 mg total) into feeding tube daily at 12 noon. Patient not taking: No sig reported 11/13/20   Murlean Iba, MD  Water For Irrigation, Sterile (FREE WATER) SOLN Place 400 mLs into feeding tube every 4 (four) hours. 11/12/20   Murlean Iba, MD     Family History  Problem Relation Age of Onset   Stomach cancer Mother    Colon  cancer Father     Social History   Socioeconomic History   Marital status: Divorced    Spouse name: Not on file   Number of children: Not on file   Years of education: Not on file   Highest education level: Not on file  Occupational History   Not on file  Tobacco Use   Smoking status: Never   Smokeless tobacco: Never  Vaping Use   Vaping Use: Never used  Substance and Sexual Activity   Alcohol use: Yes    Comment: once in a while   Drug use: No   Sexual activity: Not on file  Other Topics Concern   Not on file  Social History Narrative   Not on file   Social Determinants of Health   Financial Resource Strain: Not on file  Food Insecurity: Not on file  Transportation Needs: Not on file  Physical Activity: Not on file  Stress: Not on file  Social Connections: Not on file    Review of Systems: A 12 point ROS discussed and pertinent positives are indicated in the HPI above.  All other systems are negative.  Review of Systems  Constitutional:  Negative for fever.  HENT:  Negative for congestion.   Respiratory:  Negative for cough and shortness of breath.   Cardiovascular:  Negative for chest pain.  Gastrointestinal:  Negative for abdominal pain.  Neurological:  Negative for headaches.  Psychiatric/Behavioral:  Negative for behavioral problems and confusion.    Vital Signs: BP (!) 143/82   Pulse 65   Temp 98.3 F (36.8 C) (Oral)   Resp 16   Ht 5' 7" (1.702 m)   Wt 285 lb (129.3 kg)   SpO2 91%   BMI 44.64 kg/m   Physical Exam Vitals and nursing note reviewed.  Constitutional:      Appearance: He is well-developed.  HENT:     Head: Normocephalic.  Cardiovascular:     Rate and Rhythm: Normal rate and regular rhythm.     Heart sounds: Normal heart sounds.  Pulmonary:     Effort: Pulmonary effort is normal.     Comments: Patient is trached Musculoskeletal:        General: Normal range of motion.     Cervical back: Normal range of motion.  Skin:     General: Skin is dry.  Neurological:     Mental Status: He is alert and oriented to person, place, and time.    Imaging: No results found.  Labs:  CBC: Recent Labs    11/11/20 0536 11/12/20 0407 06/22/21 1058 08/19/21 0630  WBC 5.3 5.1 5.0 5.9  HGB 11.7* 11.6* 16.2 14.9  HCT 40.2 39.9 50.7 46.8  PLT 286 282 293 310    COAGS: Recent Labs    10/20/20 1259 10/27/20 1058 11/09/20 1356 08/19/21 0630  INR 1.5*  --  1.2 1.0  APTT 36 33  --   --  BMP: Recent Labs    11/10/20 0458 11/11/20 0536 11/12/20 0407 06/22/21 1058  NA 138 141 142 136  K 3.8 4.0 3.9 4.1  CL 100 103 104 102  CO2 _0 GLUCOSE 95 95 114* 100*  BUN 82* 80* 79* 41*  CALCIUM 10.6* 10.8* 10.8* 8.7*  CREATININE 1.78* 1.99* 1.77* 2.54*  GFRNONAA 45* 39* 45* 29*    LIVER FUNCTION TESTS: Recent Labs    10/20/20 1217 10/22/20 0512 10/24/20 0402 10/25/20 0618 10/26/20 0436 11/10/20 0458 11/11/20 0536 11/12/20 0407 06/22/21 1058  BILITOT 0.4 1.0 0.6 0.8  --   --   --   --   --   AST 33 _1 --   --   --   --   --   ALT _2 --   --   --   --   --   ALKPHOS 114 84 71 75  --   --   --   --   --   PROT 7.9 6.2* 6.1* 6.9  --   --   --   --   --   ALBUMIN 3.2* 2.3* 2.6* 3.0*   < > 2.4* 2.6* 2.5* 3.5   < > = values in this interval not displayed.     Assessment and Plan:  55 y.o. male outpatient. History of HTN, HLD, CKD, Hyperparathyroidism. Admitted earlier this year for hypoxia s/p trach and G tube placement and acute on chronic AKI.  Now with worsening proteinuria. Team is requesting a kidney biopsy for further evaluation of possible FSGS.   Patient is on eliquis which he states has been held before 10.10.22. All labs and medications are with acceptable parameters. NKDA. Patient has been NPO since midnight.  Risks and benefits of kidney was discussed with the patient and/or patient's family including, but not limited to bleeding, infection, damage to adjacent  structures or low yield requiring additional tests.  All of the questions were answered and there is agreement to proceed.  Consent signed and in chart.   Thank you for this interesting consult.  I greatly enjoyed meeting Jack Cox and look forward to participating in their care.  A copy of this report was sent to the requesting provider on this date.  Electronically Signed: Jacqualine Mau, NP 08/19/2021, 7:12 AM   I spent a total of  30 Minutes   in face to face in clinical consultation, greater than 50% of which was counseling/coordinating care for random kidney biopsy

## 2021-08-19 NOTE — Progress Notes (Signed)
Per Anderson Malta, NP patient will resume Eliquis in 48 hours. Instructions given to patient and placed on AVS. Patient verbalized understanding.

## 2021-08-19 NOTE — Sedation Documentation (Signed)
Attempted to call report. Nurse unavailable. Will call back.

## 2021-09-02 ENCOUNTER — Encounter: Payer: Self-pay | Admitting: Podiatry

## 2021-09-02 ENCOUNTER — Ambulatory Visit (INDEPENDENT_AMBULATORY_CARE_PROVIDER_SITE_OTHER): Payer: 59 | Admitting: Podiatry

## 2021-09-02 ENCOUNTER — Ambulatory Visit: Payer: 59 | Admitting: Podiatry

## 2021-09-02 ENCOUNTER — Other Ambulatory Visit: Payer: Self-pay

## 2021-09-02 DIAGNOSIS — M79674 Pain in right toe(s): Secondary | ICD-10-CM | POA: Diagnosis not present

## 2021-09-02 DIAGNOSIS — B351 Tinea unguium: Secondary | ICD-10-CM

## 2021-09-02 DIAGNOSIS — M79675 Pain in left toe(s): Secondary | ICD-10-CM | POA: Diagnosis not present

## 2021-09-02 DIAGNOSIS — N179 Acute kidney failure, unspecified: Secondary | ICD-10-CM | POA: Diagnosis not present

## 2021-09-02 DIAGNOSIS — N184 Chronic kidney disease, stage 4 (severe): Secondary | ICD-10-CM

## 2021-09-02 NOTE — Progress Notes (Signed)
This patient returns to my office for at risk foot care.  This patient requires this care by a professional since this patient will be at risk due to having kidney injury and CKD and coagulation defect.  Patient is taking eliquis.    This patient is unable to cut nails himself since the patient cannot reach his nails.These nails are painful walking and wearing shoes.  This patient presents for at risk foot care today.  General Appearance  Alert, conversant and in no acute stress.  Vascular  Dorsalis pedis and posterior tibial  pulses are  not palpable  bilaterally.  Capillary return is within normal limits  bilaterally. Temperature is within normal limits  bilaterally.  Neurologic  Senn-Weinstein monofilament wire test within normal limits  bilaterally. Muscle power within normal limits bilaterally.  Nails Thick disfigured discolored nails with subungual debris  from hallux to fifth toes bilaterally. No evidence of bacterial infection or drainage bilaterally.  Orthopedic  No limitations of motion  feet .  No crepitus or effusions noted.  No bony pathology or digital deformities noted.  Skin  normotropic skin with no porokeratosis noted bilaterally.  No signs of infections or ulcers noted.     Onychomycosis  Pain in right toes  Pain in left toes  Consent was obtained for treatment procedures.  IE.     Mechanical debridement of nails 1-5  bilaterally performed with a nail nipper.  Filed with dremel without incident.    Return office visit   3 months                  Told patient to return for periodic foot care and evaluation due to potential at risk complications.   Gardiner Barefoot DPM

## 2021-09-12 LAB — SURGICAL PATHOLOGY

## 2021-09-21 ENCOUNTER — Encounter (HOSPITAL_COMMUNITY): Payer: Self-pay

## 2021-12-09 ENCOUNTER — Other Ambulatory Visit: Payer: Self-pay

## 2021-12-09 ENCOUNTER — Ambulatory Visit (INDEPENDENT_AMBULATORY_CARE_PROVIDER_SITE_OTHER): Payer: 59 | Admitting: Podiatry

## 2021-12-09 ENCOUNTER — Encounter: Payer: Self-pay | Admitting: Podiatry

## 2021-12-09 DIAGNOSIS — M79675 Pain in left toe(s): Secondary | ICD-10-CM | POA: Diagnosis not present

## 2021-12-09 DIAGNOSIS — M79674 Pain in right toe(s): Secondary | ICD-10-CM | POA: Diagnosis not present

## 2021-12-09 DIAGNOSIS — B351 Tinea unguium: Secondary | ICD-10-CM

## 2021-12-09 DIAGNOSIS — N184 Chronic kidney disease, stage 4 (severe): Secondary | ICD-10-CM

## 2021-12-09 NOTE — Progress Notes (Addendum)
This patient returns to my office for at risk foot care.  This patient requires this care by a professional since this patient will be at risk due to having kidney injury and CKD and coagulation defect.  Patient is taking eliquis.    This patient is unable to cut nails himself since the patient cannot reach his nails.These nails are painful walking and wearing shoes.  This patient presents for at risk foot care today.  General Appearance  Alert, conversant and in no acute stress.  Vascular  Dorsalis pedis and posterior tibial  pulses are  not palpable  bilaterally.  Capillary return is within normal limits  bilaterally. Temperature is within normal limits  bilaterally.  Neurologic  Senn-Weinstein monofilament wire test within normal limits  bilaterally. Muscle power within normal limits bilaterally.  Nails Thick disfigured discolored nails with subungual debris  from hallux to third  toes bilaterally. No evidence of bacterial infection or drainage bilaterally.  Orthopedic  No limitations of motion  feet .  No crepitus or effusions noted.  No bony pathology or digital deformities noted.  Skin  normotropic skin with no porokeratosis noted bilaterally.  No signs of infections or ulcers noted.     Onychomycosis  Pain in right toes  Pain in left toes  Consent was obtained for treatment procedures.    Mechanical debridement of nails 1-5  bilaterally performed with a nail nipper.  Filed with dremel without incident.    Return office visit   3 months                  Told patient to return for periodic foot care and evaluation due to potential at risk complications.   Gardiner Barefoot DPM

## 2021-12-14 ENCOUNTER — Ambulatory Visit (HOSPITAL_COMMUNITY): Payer: 59 | Attending: Family Medicine | Admitting: Occupational Therapy

## 2021-12-14 ENCOUNTER — Encounter (HOSPITAL_COMMUNITY): Payer: Self-pay | Admitting: Occupational Therapy

## 2021-12-14 ENCOUNTER — Other Ambulatory Visit: Payer: Self-pay

## 2021-12-14 DIAGNOSIS — R29898 Other symptoms and signs involving the musculoskeletal system: Secondary | ICD-10-CM | POA: Diagnosis not present

## 2021-12-14 NOTE — Therapy (Signed)
[place], [place], [address] Phone: [phone]   Fax:  [phone]  Occupational Therapy Evaluation  Patient Details  Name: Jack Cox MRN: [id number] Date of Birth: 19-Jul-1966 Referring Provider (OT): Bernardo Heater, FNP   Encounter Date: 12/14/2021   OT End of Session - 12/14/21 1402     Visit Number 1    Number of Visits 1    Date for OT Re-Evaluation 12/16/21    Authorization Type 1) UHC 2) Medicaid    Progress Note Due on Visit 10    OT Start Time 1305    OT Stop Time 1330    OT Time Calculation (min) 25 min    Activity Tolerance Patient tolerated treatment well    Behavior During Therapy [facility] for tasks assessed/performed             Past Medical History:  Diagnosis Date   CKD (chronic kidney disease)    Congestive heart failure (CHF) ([place])    Essential hypertension    Fatigue    Gout    Hypercholesteremia    Insomnia    Proteinuria    Renal insufficiency    Shortness of breath    Tracheostomy dependence ([place])     Past Surgical History:  Procedure Laterality Date   IR GASTROSTOMY TUBE MOD SED  11/10/2020    There were no vitals filed for this visit.   Subjective Assessment - 12/14/21 1342     Subjective  S: I have good days and bad days.    Pertinent History Pt is a 56 y/o male presenting for wheelchair evaluation secondary to multiple health conditions. Pt was referred to occupational therapy for evaluation by Bernardo Heater, FNP.    Patient Stated Goals To be able to get around on my bad days.    Currently in Pain? No/denies               [facility] OT Assessment - 12/14/21 1401       Assessment   Medical Diagnosis wheelchair evaluation    Referring Provider (OT) Bernardo Heater, FNP    Hand Dominance Right    Next MD Visit unsure    Prior Therapy None      Precautions   Precautions None      Restrictions   Weight Bearing Restrictions No      Balance Screen   Has  the patient fallen in the past 6 months No               Date: 12/14/21 Patient Name: Jack Cox Address: [address], [place], [place] [address] DOB: 10-13-1966  To whom it may concern,   Jack Cox is a 56 year old male who has been referred to occupational therapy for assessment of need for a power wheelchair. Jack Cox has a medical history that includes CHF, atrial flutter, obstructive sleep apnea, hypertension, right bundle branch block. 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 Has a hx of a feeding tube that has since been removed.     Jack Cox lives with his brother in a single-story home and has an aide present for 2 hours per day Monday through Friday who assists with housekeeping, laundry, and cooking. Pt is independent in all ADLs and mobility tasks. Reports ability to get out of bed and mobilize independently even on his bad days, it just takes a little more time.   Jack Cox presents for evaluation walking approximately 100  feet twice from waiting room to ADL room in a standard time, did not require rest breaks, no loss of balance or SOB present.  Reports he uses a SPC approximately 3 days per week and the standard walker occasionally.    A FULL PHYSICAL ASSESSMENT REVEALS THE FOLLOWING   Existing Equipment: manual wheelchair, standard walker, SPC, shower seat, hospital      bed.     Transfers: Independent   Head and Neck: ROM WNL     Trunk: ROM WFL   Pelvis: ROM WFL     Hip: ROM WFL   Knees: ROM WFL   Feet and Ankles: ROM WFL    Upper Extremities: BUE ROM is WNL, LUE strength is 5/5, RUE strength is 5/5 with              exception of shoulder abduction which is 3+/5.    Lower Extremities: strength 5/5   Weight Shifting Ability: WNL/independent    Skin Integrity: hx of one pressure sore while at [facility], resolved with no recurrences    Cognition: WNL    Activity Tolerance:  On good days pt can be mobile and  walking up to 30-40 minutes at     a time. On bad days it takes a while to get comfortable and be      mobile.   GOALS/OBJECTIVE OF SEATING INTERVENTION:    Function: Jack Cox is currently independent in ADL tasks, functional mobility in the home, and some I/ADL tasks in the home. Jack Cox primary concern is maintaining his independence when having bad days. Discussed with Jack Cox his current functioning and mobility. At this time Jack Cox does not qualify for a power wheelchair as he is independent in ADL tasks and short distances with community mobility with no DME. Recommend PT/OT services for improving strength in RUE for strength deficits.      If you require any further information concerning Jack Cox's positioning, independence or mobility needs; or any further information why a lesser device will not work, please do not hesitate to contact me at [place], [place] [place], [place] [address] [phone].         Visit Diagnosis: Other symptoms and signs involving the musculoskeletal system    Problem List Patient Active Problem List   Diagnosis Date Noted   (HFpEF) heart failure with preserved ejection fraction ([place]) 02/25/2021   Pain due to onychomycosis of toenails of both feet 02/25/2021   Encounter for care related to feeding tube 01/26/2021   Acute on chronic congestive heart failure ([place])    Acute respiratory failure with hypoxia and hypercapnia ([place])    AKI (acute kidney injury) ([place]) 10/26/2020   Atelectasis, left    Acute on chronic respiratory failure ([employer]) 10/20/2020   Acute on chronic respiratory failure with hypoxia and hypercapnia ([place]) 10/20/2020   Acute on chronic diastolic CHF (congestive heart failure) ([place]) 10/20/2020   Morbid obesity with BMI of 50.0-59.9, adult ([place]) 10/20/2020   CKD (chronic kidney disease) stage 4, GFR 15-29 ml/min ([place]) 10/20/2020   Hypertension 12/18/2019    Atrial flutter ([place]) 07/26/2019   Chronic respiratory failure with hypoxia ([place]) 02/05/2018   OSA (obstructive sleep apnea) 10/23/2017   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

## 2022-03-10 ENCOUNTER — Ambulatory Visit (INDEPENDENT_AMBULATORY_CARE_PROVIDER_SITE_OTHER): Payer: 59 | Admitting: Podiatry

## 2022-03-10 ENCOUNTER — Encounter: Payer: Self-pay | Admitting: Podiatry

## 2022-03-10 DIAGNOSIS — B351 Tinea unguium: Secondary | ICD-10-CM | POA: Diagnosis not present

## 2022-03-10 DIAGNOSIS — N184 Chronic kidney disease, stage 4 (severe): Secondary | ICD-10-CM | POA: Diagnosis not present

## 2022-03-10 DIAGNOSIS — M79674 Pain in right toe(s): Secondary | ICD-10-CM

## 2022-03-10 DIAGNOSIS — N179 Acute kidney failure, unspecified: Secondary | ICD-10-CM | POA: Diagnosis not present

## 2022-03-10 DIAGNOSIS — M79675 Pain in left toe(s): Secondary | ICD-10-CM | POA: Diagnosis not present

## 2022-03-10 NOTE — Progress Notes (Signed)
This patient returns to my office for at risk foot care.  This patient requires this care by a professional since this patient will be at risk due to having kidney injury and CKD and coagulation defect.  Patient is taking eliquis.    This patient is unable to cut nails himself since the patient cannot reach his nails.These nails are painful walking and wearing shoes.  This patient presents for at risk foot care today. ? ?General Appearance  Alert, conversant and in no acute stress. ? ?Vascular  Dorsalis pedis and posterior tibial  pulses are  not palpable  bilaterally.  Capillary return is within normal limits  bilaterally. Temperature is within normal limits  bilaterally. ? ?Neurologic  Senn-Weinstein monofilament wire test within normal limits  bilaterally. Muscle power within normal limits bilaterally. ? ?Nails Thick disfigured discolored nails with subungual debris  from hallux to third  toes bilaterally. No evidence of bacterial infection or drainage bilaterally. ? ?Orthopedic  No limitations of motion  feet .  No crepitus or effusions noted.  No bony pathology or digital deformities noted. ? ?Skin  normotropic skin with no porokeratosis noted bilaterally.  No signs of infections or ulcers noted.    ? ?Onychomycosis  Pain in right toes  Pain in left toes ? ?Consent was obtained for treatment procedures.    Mechanical debridement of nails 1-5  bilaterally performed with a nail nipper.  Filed with dremel without incident.  ? ? ?Return office visit   3 months                  Told patient to return for periodic foot care and evaluation due to potential at risk complications. ? ? ?Gardiner Barefoot DPM  ?

## 2022-06-16 ENCOUNTER — Ambulatory Visit: Payer: 59 | Admitting: Podiatry

## 2023-03-09 ENCOUNTER — Other Ambulatory Visit (HOSPITAL_COMMUNITY): Payer: Self-pay | Admitting: Internal Medicine

## 2023-03-09 DIAGNOSIS — N189 Chronic kidney disease, unspecified: Secondary | ICD-10-CM

## 2023-03-27 ENCOUNTER — Ambulatory Visit (HOSPITAL_COMMUNITY)
Admission: RE | Admit: 2023-03-27 | Discharge: 2023-03-27 | Disposition: A | Discharge status: Home / Self Care | Payer: 59 | Source: Ambulatory Visit | Attending: Internal Medicine | Admitting: Internal Medicine

## 2023-03-27 DIAGNOSIS — N189 Chronic kidney disease, unspecified: Secondary | ICD-10-CM | POA: Diagnosis present

## 2023-05-07 DIAGNOSIS — I10 Essential (primary) hypertension: Secondary | ICD-10-CM | POA: Diagnosis not present

## 2023-05-07 DIAGNOSIS — I509 Heart failure, unspecified: Secondary | ICD-10-CM | POA: Diagnosis not present

## 2023-05-07 DIAGNOSIS — Z299 Encounter for prophylactic measures, unspecified: Secondary | ICD-10-CM | POA: Diagnosis not present

## 2023-05-07 DIAGNOSIS — J9611 Chronic respiratory failure with hypoxia: Secondary | ICD-10-CM | POA: Diagnosis not present

## 2023-05-07 DIAGNOSIS — D6869 Other thrombophilia: Secondary | ICD-10-CM | POA: Diagnosis not present

## 2023-05-08 DIAGNOSIS — N185 Chronic kidney disease, stage 5: Secondary | ICD-10-CM | POA: Diagnosis not present

## 2023-05-08 DIAGNOSIS — R600 Localized edema: Secondary | ICD-10-CM | POA: Diagnosis not present

## 2023-05-08 DIAGNOSIS — N269 Renal sclerosis, unspecified: Secondary | ICD-10-CM | POA: Diagnosis not present

## 2023-05-08 DIAGNOSIS — N2581 Secondary hyperparathyroidism of renal origin: Secondary | ICD-10-CM | POA: Diagnosis not present

## 2023-05-10 DIAGNOSIS — G4733 Obstructive sleep apnea (adult) (pediatric): Secondary | ICD-10-CM | POA: Diagnosis not present

## 2023-05-17 DIAGNOSIS — E8722 Chronic metabolic acidosis: Secondary | ICD-10-CM | POA: Diagnosis not present

## 2023-05-17 DIAGNOSIS — N269 Renal sclerosis, unspecified: Secondary | ICD-10-CM | POA: Diagnosis not present

## 2023-05-17 DIAGNOSIS — G4733 Obstructive sleep apnea (adult) (pediatric): Secondary | ICD-10-CM | POA: Diagnosis not present

## 2023-05-17 DIAGNOSIS — N2581 Secondary hyperparathyroidism of renal origin: Secondary | ICD-10-CM | POA: Diagnosis not present

## 2023-05-17 DIAGNOSIS — N185 Chronic kidney disease, stage 5: Secondary | ICD-10-CM | POA: Diagnosis not present

## 2023-05-17 DIAGNOSIS — R808 Other proteinuria: Secondary | ICD-10-CM | POA: Diagnosis not present

## 2023-05-17 DIAGNOSIS — I10 Essential (primary) hypertension: Secondary | ICD-10-CM | POA: Diagnosis not present

## 2023-05-17 DIAGNOSIS — R600 Localized edema: Secondary | ICD-10-CM | POA: Diagnosis not present

## 2023-06-04 DIAGNOSIS — Z7189 Other specified counseling: Secondary | ICD-10-CM | POA: Diagnosis not present

## 2023-06-04 DIAGNOSIS — Z299 Encounter for prophylactic measures, unspecified: Secondary | ICD-10-CM | POA: Diagnosis not present

## 2023-06-04 DIAGNOSIS — J449 Chronic obstructive pulmonary disease, unspecified: Secondary | ICD-10-CM | POA: Diagnosis not present

## 2023-06-04 DIAGNOSIS — I10 Essential (primary) hypertension: Secondary | ICD-10-CM | POA: Diagnosis not present

## 2023-06-04 DIAGNOSIS — Z0000 Encounter for general adult medical examination without abnormal findings: Secondary | ICD-10-CM | POA: Diagnosis not present

## 2023-06-04 DIAGNOSIS — I509 Heart failure, unspecified: Secondary | ICD-10-CM | POA: Diagnosis not present

## 2023-06-07 DIAGNOSIS — N186 End stage renal disease: Secondary | ICD-10-CM | POA: Diagnosis not present

## 2023-06-07 DIAGNOSIS — I120 Hypertensive chronic kidney disease with stage 5 chronic kidney disease or end stage renal disease: Secondary | ICD-10-CM | POA: Diagnosis not present

## 2023-06-07 DIAGNOSIS — G4733 Obstructive sleep apnea (adult) (pediatric): Secondary | ICD-10-CM | POA: Diagnosis not present

## 2023-06-07 DIAGNOSIS — I4811 Longstanding persistent atrial fibrillation: Secondary | ICD-10-CM | POA: Diagnosis not present

## 2023-06-07 DIAGNOSIS — Z930 Tracheostomy status: Secondary | ICD-10-CM | POA: Diagnosis not present

## 2023-06-10 DIAGNOSIS — G4733 Obstructive sleep apnea (adult) (pediatric): Secondary | ICD-10-CM | POA: Diagnosis not present

## 2023-06-19 DIAGNOSIS — N185 Chronic kidney disease, stage 5: Secondary | ICD-10-CM | POA: Diagnosis not present

## 2023-06-19 DIAGNOSIS — R600 Localized edema: Secondary | ICD-10-CM | POA: Diagnosis not present

## 2023-06-19 DIAGNOSIS — N269 Renal sclerosis, unspecified: Secondary | ICD-10-CM | POA: Diagnosis not present

## 2023-06-19 DIAGNOSIS — N2581 Secondary hyperparathyroidism of renal origin: Secondary | ICD-10-CM | POA: Diagnosis not present

## 2023-06-25 DIAGNOSIS — J449 Chronic obstructive pulmonary disease, unspecified: Secondary | ICD-10-CM | POA: Diagnosis not present

## 2023-06-25 DIAGNOSIS — N185 Chronic kidney disease, stage 5: Secondary | ICD-10-CM | POA: Diagnosis not present

## 2023-06-25 DIAGNOSIS — I509 Heart failure, unspecified: Secondary | ICD-10-CM | POA: Diagnosis not present

## 2023-06-25 DIAGNOSIS — E1122 Type 2 diabetes mellitus with diabetic chronic kidney disease: Secondary | ICD-10-CM | POA: Diagnosis not present

## 2023-06-25 DIAGNOSIS — I4891 Unspecified atrial fibrillation: Secondary | ICD-10-CM | POA: Diagnosis not present

## 2023-07-11 DIAGNOSIS — G4733 Obstructive sleep apnea (adult) (pediatric): Secondary | ICD-10-CM | POA: Diagnosis not present

## 2023-07-19 DIAGNOSIS — N185 Chronic kidney disease, stage 5: Secondary | ICD-10-CM | POA: Diagnosis not present

## 2023-07-19 DIAGNOSIS — I10 Essential (primary) hypertension: Secondary | ICD-10-CM | POA: Diagnosis not present

## 2023-07-19 DIAGNOSIS — N269 Renal sclerosis, unspecified: Secondary | ICD-10-CM | POA: Diagnosis not present

## 2023-07-19 DIAGNOSIS — N2581 Secondary hyperparathyroidism of renal origin: Secondary | ICD-10-CM | POA: Diagnosis not present

## 2023-07-19 DIAGNOSIS — R600 Localized edema: Secondary | ICD-10-CM | POA: Diagnosis not present

## 2023-07-19 DIAGNOSIS — D631 Anemia in chronic kidney disease: Secondary | ICD-10-CM | POA: Diagnosis not present

## 2023-07-30 DIAGNOSIS — E785 Hyperlipidemia, unspecified: Secondary | ICD-10-CM | POA: Diagnosis not present

## 2023-07-30 DIAGNOSIS — I5032 Chronic diastolic (congestive) heart failure: Secondary | ICD-10-CM | POA: Diagnosis not present

## 2023-08-07 DIAGNOSIS — R110 Nausea: Secondary | ICD-10-CM | POA: Diagnosis not present

## 2023-08-07 DIAGNOSIS — Z930 Tracheostomy status: Secondary | ICD-10-CM | POA: Diagnosis not present

## 2023-08-07 DIAGNOSIS — Z939 Artificial opening status, unspecified: Secondary | ICD-10-CM | POA: Diagnosis not present

## 2023-08-09 DIAGNOSIS — I5032 Chronic diastolic (congestive) heart failure: Secondary | ICD-10-CM | POA: Diagnosis not present

## 2023-08-10 DIAGNOSIS — I10 Essential (primary) hypertension: Secondary | ICD-10-CM | POA: Diagnosis not present

## 2023-08-10 DIAGNOSIS — I509 Heart failure, unspecified: Secondary | ICD-10-CM | POA: Diagnosis not present

## 2023-08-10 DIAGNOSIS — N185 Chronic kidney disease, stage 5: Secondary | ICD-10-CM | POA: Diagnosis not present

## 2023-08-10 DIAGNOSIS — R110 Nausea: Secondary | ICD-10-CM | POA: Diagnosis not present

## 2023-08-10 DIAGNOSIS — E1122 Type 2 diabetes mellitus with diabetic chronic kidney disease: Secondary | ICD-10-CM | POA: Diagnosis not present

## 2023-08-10 DIAGNOSIS — G4733 Obstructive sleep apnea (adult) (pediatric): Secondary | ICD-10-CM | POA: Diagnosis not present

## 2023-08-13 ENCOUNTER — Other Ambulatory Visit: Payer: Self-pay | Admitting: *Deleted

## 2023-08-13 DIAGNOSIS — N184 Chronic kidney disease, stage 4 (severe): Secondary | ICD-10-CM

## 2023-08-15 DIAGNOSIS — R110 Nausea: Secondary | ICD-10-CM | POA: Diagnosis not present

## 2023-08-15 DIAGNOSIS — Z299 Encounter for prophylactic measures, unspecified: Secondary | ICD-10-CM | POA: Diagnosis not present

## 2023-08-15 DIAGNOSIS — I10 Essential (primary) hypertension: Secondary | ICD-10-CM | POA: Diagnosis not present

## 2023-08-18 DIAGNOSIS — R55 Syncope and collapse: Secondary | ICD-10-CM | POA: Diagnosis not present

## 2023-08-18 DIAGNOSIS — I6782 Cerebral ischemia: Secondary | ICD-10-CM | POA: Diagnosis not present

## 2023-08-18 DIAGNOSIS — S0990XA Unspecified injury of head, initial encounter: Secondary | ICD-10-CM | POA: Diagnosis not present

## 2023-08-18 DIAGNOSIS — I469 Cardiac arrest, cause unspecified: Secondary | ICD-10-CM | POA: Diagnosis not present

## 2023-08-18 DIAGNOSIS — I480 Paroxysmal atrial fibrillation: Secondary | ICD-10-CM | POA: Diagnosis not present

## 2023-08-18 DIAGNOSIS — R404 Transient alteration of awareness: Secondary | ICD-10-CM | POA: Diagnosis not present

## 2023-08-18 DIAGNOSIS — Z930 Tracheostomy status: Secondary | ICD-10-CM | POA: Diagnosis not present

## 2023-08-18 DIAGNOSIS — I5030 Unspecified diastolic (congestive) heart failure: Secondary | ICD-10-CM | POA: Diagnosis not present

## 2023-08-18 DIAGNOSIS — Z743 Need for continuous supervision: Secondary | ICD-10-CM | POA: Diagnosis not present

## 2023-08-18 DIAGNOSIS — N189 Chronic kidney disease, unspecified: Secondary | ICD-10-CM | POA: Diagnosis not present

## 2023-08-18 DIAGNOSIS — Z7901 Long term (current) use of anticoagulants: Secondary | ICD-10-CM | POA: Diagnosis not present

## 2023-08-18 DIAGNOSIS — G4733 Obstructive sleep apnea (adult) (pediatric): Secondary | ICD-10-CM | POA: Diagnosis not present

## 2023-08-18 DIAGNOSIS — M109 Gout, unspecified: Secondary | ICD-10-CM | POA: Diagnosis not present

## 2023-08-18 DIAGNOSIS — N179 Acute kidney failure, unspecified: Secondary | ICD-10-CM | POA: Diagnosis not present

## 2023-08-18 DIAGNOSIS — Z79899 Other long term (current) drug therapy: Secondary | ICD-10-CM | POA: Diagnosis not present

## 2023-08-18 DIAGNOSIS — R0689 Other abnormalities of breathing: Secondary | ICD-10-CM | POA: Diagnosis not present

## 2023-08-18 DIAGNOSIS — I132 Hypertensive heart and chronic kidney disease with heart failure and with stage 5 chronic kidney disease, or end stage renal disease: Secondary | ICD-10-CM | POA: Diagnosis not present

## 2023-08-18 DIAGNOSIS — I509 Heart failure, unspecified: Secondary | ICD-10-CM | POA: Diagnosis not present

## 2023-08-18 DIAGNOSIS — I4892 Unspecified atrial flutter: Secondary | ICD-10-CM | POA: Diagnosis not present

## 2023-08-18 DIAGNOSIS — N185 Chronic kidney disease, stage 5: Secondary | ICD-10-CM | POA: Diagnosis not present

## 2023-08-18 DIAGNOSIS — M19011 Primary osteoarthritis, right shoulder: Secondary | ICD-10-CM | POA: Diagnosis not present

## 2023-08-18 DIAGNOSIS — M25511 Pain in right shoulder: Secondary | ICD-10-CM | POA: Diagnosis not present

## 2023-08-18 DIAGNOSIS — I120 Hypertensive chronic kidney disease with stage 5 chronic kidney disease or end stage renal disease: Secondary | ICD-10-CM | POA: Diagnosis not present

## 2023-08-19 DIAGNOSIS — Z8679 Personal history of other diseases of the circulatory system: Secondary | ICD-10-CM | POA: Diagnosis not present

## 2023-08-19 DIAGNOSIS — R55 Syncope and collapse: Secondary | ICD-10-CM | POA: Diagnosis not present

## 2023-08-19 DIAGNOSIS — Z7901 Long term (current) use of anticoagulants: Secondary | ICD-10-CM | POA: Diagnosis not present

## 2023-08-19 DIAGNOSIS — N179 Acute kidney failure, unspecified: Secondary | ICD-10-CM | POA: Diagnosis not present

## 2023-08-19 DIAGNOSIS — Z9989 Dependence on other enabling machines and devices: Secondary | ICD-10-CM | POA: Diagnosis not present

## 2023-08-19 DIAGNOSIS — E875 Hyperkalemia: Secondary | ICD-10-CM | POA: Diagnosis not present

## 2023-08-19 DIAGNOSIS — I5030 Unspecified diastolic (congestive) heart failure: Secondary | ICD-10-CM | POA: Diagnosis not present

## 2023-08-19 DIAGNOSIS — I480 Paroxysmal atrial fibrillation: Secondary | ICD-10-CM | POA: Diagnosis not present

## 2023-08-19 DIAGNOSIS — N185 Chronic kidney disease, stage 5: Secondary | ICD-10-CM | POA: Diagnosis not present

## 2023-08-20 DIAGNOSIS — R6889 Other general symptoms and signs: Secondary | ICD-10-CM | POA: Diagnosis not present

## 2023-08-20 DIAGNOSIS — E875 Hyperkalemia: Secondary | ICD-10-CM | POA: Diagnosis not present

## 2023-08-20 DIAGNOSIS — I4892 Unspecified atrial flutter: Secondary | ICD-10-CM | POA: Diagnosis not present

## 2023-08-20 DIAGNOSIS — G4730 Sleep apnea, unspecified: Secondary | ICD-10-CM | POA: Diagnosis not present

## 2023-08-20 DIAGNOSIS — I480 Paroxysmal atrial fibrillation: Secondary | ICD-10-CM | POA: Diagnosis not present

## 2023-08-20 DIAGNOSIS — N179 Acute kidney failure, unspecified: Secondary | ICD-10-CM | POA: Diagnosis not present

## 2023-08-20 DIAGNOSIS — N185 Chronic kidney disease, stage 5: Secondary | ICD-10-CM | POA: Diagnosis not present

## 2023-08-20 DIAGNOSIS — Z743 Need for continuous supervision: Secondary | ICD-10-CM | POA: Diagnosis not present

## 2023-08-20 DIAGNOSIS — I132 Hypertensive heart and chronic kidney disease with heart failure and with stage 5 chronic kidney disease, or end stage renal disease: Secondary | ICD-10-CM | POA: Diagnosis not present

## 2023-08-20 DIAGNOSIS — R0902 Hypoxemia: Secondary | ICD-10-CM | POA: Diagnosis not present

## 2023-08-20 DIAGNOSIS — R55 Syncope and collapse: Secondary | ICD-10-CM | POA: Diagnosis not present

## 2023-08-20 DIAGNOSIS — I5030 Unspecified diastolic (congestive) heart failure: Secondary | ICD-10-CM | POA: Diagnosis not present

## 2023-08-23 ENCOUNTER — Ambulatory Visit (INDEPENDENT_AMBULATORY_CARE_PROVIDER_SITE_OTHER)
Admission: RE | Admit: 2023-08-23 | Discharge: 2023-08-23 | Disposition: A | Discharge status: Home / Self Care | Payer: Medicare Other | Source: Ambulatory Visit | Attending: Vascular Surgery | Admitting: Vascular Surgery

## 2023-08-23 ENCOUNTER — Encounter: Payer: Self-pay | Admitting: Vascular Surgery

## 2023-08-23 ENCOUNTER — Ambulatory Visit (HOSPITAL_COMMUNITY)
Admission: RE | Admit: 2023-08-23 | Discharge: 2023-08-23 | Disposition: A | Discharge status: Home / Self Care | Payer: Medicare Other | Source: Ambulatory Visit | Attending: Vascular Surgery | Admitting: Vascular Surgery

## 2023-08-23 ENCOUNTER — Ambulatory Visit: Payer: Medicare Other | Admitting: Vascular Surgery

## 2023-08-23 VITALS — BP 132/86 | HR 68 | Temp 98.0°F | Ht 67.0 in | Wt 275.6 lb

## 2023-08-23 DIAGNOSIS — N185 Chronic kidney disease, stage 5: Secondary | ICD-10-CM

## 2023-08-23 DIAGNOSIS — N184 Chronic kidney disease, stage 4 (severe): Secondary | ICD-10-CM | POA: Diagnosis not present

## 2023-08-23 NOTE — Progress Notes (Signed)
Office Note     CC: CKD 5 Requesting Provider:  Mosetta Pigeon, MD  HPI: Jack Cox is a Right handed 57 y.o. (07/30/1966) male with kidney disease who presents at the request of Mosetta Pigeon, MD for permanent HD access. The patient has had no prior access procedures.   On exam, Jack Cox was doing well.  A native of [place], he continues to live there with his brother.  He has a complex medical history including chronic tracheostomy for which he needed in the past, but now uses nasal cannula with 2 L supplemental O2 both during the day and at night.  He also previously had a feeding tube which has been removed.  He has used a wheelchair in the past, but walked into the office without an assist device.  Jack Cox had very few questions regarding dialysis and understood that he would likely need hemodialysis in the coming months.    Past Medical History:  Diagnosis Date   CKD (chronic kidney disease)    Congestive heart failure (CHF) (HCC)    Essential hypertension    Fatigue    Gout    Hypercholesteremia    Insomnia    Proteinuria    Renal insufficiency    Shortness of breath    Tracheostomy dependence (HCC)     Past Surgical History:  Procedure Laterality Date   IR GASTROSTOMY TUBE MOD SED  11/10/2020    Social History   Socioeconomic History   Marital status: Divorced    Spouse name: Not on file   Number of children: Not on file   Years of education: Not on file   Highest education level: Not on file  Occupational History   Not on file  Tobacco Use   Smoking status: Never   Smokeless tobacco: Never  Vaping Use   Vaping status: Never Used  Substance and Sexual Activity   Alcohol use: Yes    Comment: once in a while   Drug use: No   Sexual activity: Not on file  Other Topics Concern   Not on file  Social History Narrative   Not on file   Social Determinants of Health   Financial Resource Strain: Patient Declined (08/18/2023)   Received  from [facility]   Overall Financial Resource Strain (CARDIA)    Difficulty of Paying Living Expenses: Patient declined  Food Insecurity: No Food Insecurity (08/18/2023)   Received from [facility]   Hunger Vital Sign    Worried About Running Out of Food in the Last Year: Never true    Ran Out of Food in the Last Year: Never true  Transportation Needs: No Transportation Needs (08/18/2023)   Received from [facility]   PRAPARE - Transportation    Lack of Transportation (Medical): No    Lack of Transportation (Non-Medical): No  Physical Activity: Inactive (08/18/2023)   Received from [facility]   Exercise Vital Sign    Days of Exercise per Week: 0 days    Minutes of Exercise per Session: 0 min  Stress: No Stress Concern Present (08/18/2023)   Received from [facility] - Occupational Stress Questionnaire    Feeling of Stress : Not at all  Social Connections: Patient Declined (08/18/2023)   Received from [facility]   Social Connection and Isolation Panel [NHANES]    Frequency of Communication with Friends and Family: Patient declined    Frequency of Social Gatherings  with Friends and Family: Patient declined    Attends Religious Services: Patient declined    Active Member of Clubs or Organizations: Patient declined    Attends Banker Meetings: Patient declined    Marital Status: Patient declined  Intimate Partner Violence: Patient Declined (08/18/2023)   Received from [facility]   Humiliation, Afraid, Rape, and Kick questionnaire    Fear of Current or Ex-Partner: Patient declined    Emotionally Abused: Patient declined    Physically Abused: Patient declined    Sexually Abused: Patient declined   Family History  Problem Relation Age of Onset   Stomach cancer Mother    Colon cancer Father     Current Outpatient Medications  Medication Sig Dispense Refill   allopurinol (ZYLOPRIM) 300 MG  tablet Take 300 mg by mouth daily.     amLODipine (NORVASC) 5 MG tablet Take 5 mg by mouth daily.     apixaban (ELIQUIS) 5 MG TABS tablet Place 1 tablet (5 mg total) into feeding tube 2 (two) times daily. (Patient taking differently: Take 5 mg by mouth 2 (two) times daily.) 60 tablet    calcitRIOL (ROCALTROL) 0.25 MCG capsule Take 0.25 mcg by mouth every Monday, Wednesday, and Friday.     chlorhexidine gluconate, MEDLINE KIT, (PERIDEX) 0.12 % solution 15 mLs by Mouth Rinse route 2 (two) times daily. 120 mL 0   Cholecalciferol (VITAMIN D) 50 MCG (2000 UT) tablet Take 2,000 Units by mouth daily.     cloNIDine (CATAPRES) 0.3 MG tablet Take 0.3 mg by mouth 3 (three) times daily.     ferrous gluconate (FERGON) 240 (27 FE) MG tablet Take 240 mg by mouth daily.     losartan (COZAAR) 50 MG tablet Take 50 mg by mouth daily.     metoprolol tartrate (LOPRESSOR) 25 MG tablet Place 1 tablet (25 mg total) into feeding tube 2 (two) times daily. (Patient taking differently: Take 25 mg by mouth 2 (two) times daily.)     mirtazapine (REMERON) 30 MG tablet Take by mouth.     Multiple Vitamin (MULTIVITAMIN WITH MINERALS) TABS tablet Take 1 tablet by mouth daily.     pantoprazole (PROTONIX) 40 MG tablet Take 40 mg by mouth daily.     torsemide (DEMADEX) 20 MG tablet Take 20 mg by mouth 2 (two) times daily.     albuterol (PROVENTIL) (2.5 MG/3ML) 0.083% nebulizer solution Take 3 mLs (2.5 mg total) by nebulization 2 (two) times daily. 75 mL 12   cloNIDine (CATAPRES) 0.2 MG tablet Place 1 tablet (0.2 mg total) into feeding tube 3 (three) times daily.     losartan (COZAAR) 25 MG tablet Take 12.5 mg by mouth daily.     losartan (COZAAR) 25 MG tablet Take by mouth.     losartan (COZAAR) 50 MG tablet Take by mouth.     pantoprazole sodium (PROTONIX) 40 mg/20 mL PACK Place 20 mLs (40 mg total) into feeding tube daily at 12 noon. (Patient not taking: Reported on 08/23/2023) 30 mL    Water For Irrigation, Sterile (FREE WATER)  SOLN Place 400 mLs into feeding tube every 4 (four) hours.     No current facility-administered medications for this visit.    No Known Allergies   REVIEW OF SYSTEMS:  [X]  denotes positive finding, [ ]  denotes negative finding Cardiac  Comments:  Chest pain or chest pressure:    Shortness of breath upon exertion:    Short of breath when lying flat:  Irregular heart rhythm:        Vascular    Pain in calf, thigh, or hip brought on by ambulation:    Pain in feet at night that wakes you up from your sleep:     Blood clot in your veins:    Leg swelling:         Pulmonary    Oxygen at home:    Productive cough:     Wheezing:         Neurologic    Sudden weakness in arms or legs:     Sudden numbness in arms or legs:     Sudden onset of difficulty speaking or slurred speech:    Temporary loss of vision in one eye:     Problems with dizziness:         Gastrointestinal    Blood in stool:     Vomited blood:         Genitourinary    Burning when urinating:     Blood in urine:        Psychiatric    Major depression:         Hematologic    Bleeding problems:    Problems with blood clotting too easily:        Skin    Rashes or ulcers:        Constitutional    Fever or chills:      PHYSICAL EXAMINATION:  Vitals:   08/23/23 1400  BP: 132/86  Pulse: 68  Temp: 98 F (36.7 C)  TempSrc: Temporal  SpO2: 92%  Weight: 275 lb 9.6 oz (125 kg)  Height: 5\' 7"  (1.702 m)    General:  WDWN in NAD; vital signs documented above Gait: Not observed HENT: WNL, normocephalic Pulmonary: normal non-labored breathing , without Rales, rhonchi,  wheezing Cardiac: regular HR, Abdomen: soft, NT, no masses Skin: without rashes Vascular Exam/Pulses:  Right Left  Radial 2+ (normal) 2+ (normal)                       Extremities: without ischemic changes, without Gangrene , without cellulitis; without open wounds;  Musculoskeletal: no muscle wasting or atrophy  Neurologic:  A&O X 3;  No focal weakness or paresthesias are detected Psychiatric:  The pt has Normal affect.   Non-Invasive Vascular Imaging:   See ultrasound study    ASSESSMENT/PLAN:  Jack Cox is a 57 y.o. male who presents with chronic kidney disease stage 5  Based on vein mapping and examination, Jack Cox is a candidate for fistula creation and bilateral upper extremities Based on vein mapping and being right arm dominant, we discussed left arm brachiocephalic versus left arm brachiobasilic fistula creation in the coming weeks. I had an extensive discussion with this patient in regards to the nature of access surgery, including risk, benefits, and alternatives.   The patient is aware that the risks of access surgery include but are not limited to: bleeding, infection, steal syndrome, nerve damage, ischemic monomelic neuropathy, failure of access to mature, complications related to venous hypertension, and possible need for additional access procedures in the future. I discussed with the patient the nature of the staged access procedure, specifically the need for a second operation to transpose the first stage fistula if it matures adequately.   The patient has agreed to proceed with the above procedure which will be scheduled  Eliquis for a flutter, this will be held 2 days prior to the procedure.  Victorino Sparrow, MD [facility] [phone]

## 2023-08-24 ENCOUNTER — Other Ambulatory Visit: Payer: Self-pay

## 2023-08-24 DIAGNOSIS — N185 Chronic kidney disease, stage 5: Secondary | ICD-10-CM

## 2023-08-28 DIAGNOSIS — I10 Essential (primary) hypertension: Secondary | ICD-10-CM | POA: Diagnosis not present

## 2023-08-28 DIAGNOSIS — R55 Syncope and collapse: Secondary | ICD-10-CM | POA: Diagnosis not present

## 2023-08-28 DIAGNOSIS — I509 Heart failure, unspecified: Secondary | ICD-10-CM | POA: Diagnosis not present

## 2023-08-28 DIAGNOSIS — J449 Chronic obstructive pulmonary disease, unspecified: Secondary | ICD-10-CM | POA: Diagnosis not present

## 2023-08-28 DIAGNOSIS — Z299 Encounter for prophylactic measures, unspecified: Secondary | ICD-10-CM | POA: Diagnosis not present

## 2023-08-28 DIAGNOSIS — I700 Atherosclerosis of aorta: Secondary | ICD-10-CM | POA: Diagnosis not present

## 2023-08-30 DIAGNOSIS — D631 Anemia in chronic kidney disease: Secondary | ICD-10-CM | POA: Diagnosis not present

## 2023-08-30 DIAGNOSIS — N185 Chronic kidney disease, stage 5: Secondary | ICD-10-CM | POA: Diagnosis not present

## 2023-08-30 DIAGNOSIS — R808 Other proteinuria: Secondary | ICD-10-CM | POA: Diagnosis not present

## 2023-08-30 DIAGNOSIS — I10 Essential (primary) hypertension: Secondary | ICD-10-CM | POA: Diagnosis not present

## 2023-08-30 DIAGNOSIS — N269 Renal sclerosis, unspecified: Secondary | ICD-10-CM | POA: Diagnosis not present

## 2023-08-30 DIAGNOSIS — N2581 Secondary hyperparathyroidism of renal origin: Secondary | ICD-10-CM | POA: Diagnosis not present

## 2023-08-30 DIAGNOSIS — E8722 Chronic metabolic acidosis: Secondary | ICD-10-CM | POA: Diagnosis not present

## 2023-08-30 DIAGNOSIS — R600 Localized edema: Secondary | ICD-10-CM | POA: Diagnosis not present

## 2023-08-30 DIAGNOSIS — E875 Hyperkalemia: Secondary | ICD-10-CM | POA: Diagnosis not present

## 2023-08-30 DIAGNOSIS — I129 Hypertensive chronic kidney disease with stage 1 through stage 4 chronic kidney disease, or unspecified chronic kidney disease: Secondary | ICD-10-CM | POA: Diagnosis not present

## 2023-09-03 ENCOUNTER — Other Ambulatory Visit: Payer: Self-pay

## 2023-09-03 ENCOUNTER — Encounter (HOSPITAL_COMMUNITY): Payer: Self-pay | Admitting: Vascular Surgery

## 2023-09-03 NOTE — Anesthesia Preprocedure Evaluation (Signed)
Anesthesia Evaluation  Patient identified by MRN, date of birth, ID band Patient awake    Reviewed: Allergy & Precautions, NPO status , Patient's Chart, lab work & pertinent test results, reviewed documented beta blocker date and time   Airway Mallampati: IV  TM Distance: >3 FB Neck ROM: Limited    Dental  (+) Dental Advisory Given   Pulmonary shortness of breath, with exertion, at rest, lying and Long-Term Oxygen Therapy, sleep apnea and Continuous Positive Airway Pressure Ventilation , pneumonia, resolved S/P tracheostomy  On Home O2 at 2l/[place]   breath sounds clear to auscultation + decreased breath sounds      Cardiovascular hypertension, Pt. on medications and Pt. on home beta blockers +CHF  Normal cardiovascular exam+ dysrhythmias Atrial Fibrillation  Rhythm:Regular Rate:Normal     Neuro/Psych negative neurological ROS  negative psych ROS   GI/Hepatic Neg liver ROS,GERD  Medicated,,  Endo/Other    Morbid obesityGout Hyperlipidemia  Renal/GU ESRFRenal disease  negative genitourinary   Musculoskeletal negative musculoskeletal ROS (+)    Abdominal  (+) + obese  Peds  Hematology  (+) Blood dyscrasia, anemia   Anesthesia Other Findings   Reproductive/Obstetrics                             Anesthesia Physical Anesthesia Plan  ASA: 4  Anesthesia Plan: MAC and Regional   Post-op Pain Management: Minimal or no pain anticipated   Induction: Intravenous  PONV Risk Score and Plan: 2 and Treatment may vary due to age or medical condition and Propofol infusion  Airway Management Planned: Natural Airway, Simple Face Mask and Nasal Cannula  Additional Equipment: None  Intra-op Plan:   Post-operative Plan:   Informed Consent: I have reviewed the patients History and Physical, chart, labs and discussed the procedure including the risks, benefits and alternatives for the proposed anesthesia  with the patient or authorized representative who has indicated his/her understanding and acceptance.     Dental advisory given  Plan Discussed with:   Anesthesia Plan Comments: (PAT note by Antionette Poles, PA-C: 57 year old male follows with cardiology at [facility] for history of HFpEF, HTN, paroxysmal A-fib on Eliquis.  Last seen by Dr. Aundra Dubin 07/30/2023.  Started on rosuvastatin 5 mg once daily, no other changes to management, updated echo ordered.  TTE 08/09/2023 showed EF 55%, normal RV systolic function, no significant valvular abnormalities.  History of severe OSA s/p tracheostomy 2001.  Reports he uses BiPAP at night with 4 L of supplemental O2 via [place].  Uses 2 L via [place] during the day.  CKD 5 not yet on HD.  EKG 08/18/2023 (Care Everywhere): Sinus rhythm with 1st degree AV block.  Rate 77. Left axis deviation. Right bundle branch block   TTE 08/09/2023: Summary  1. The left ventricle is normal in size with moderately increased wall  thickness.  2. The left ventricular systolic function is normal, LVEF is visually  estimated at 55%.  3. The right ventricle is upper normal in size, with normal systolic  function.    )        Anesthesia Quick Evaluation

## 2023-09-03 NOTE — Progress Notes (Signed)
PCP - Dr Clelia Croft Cardiologist - Rayetta Pigg Assar DO Nephrology - Dr Mosetta Pigeon  Chest x-ray - n/a EKG - 08/18/23 Stress Test - n/a ECHO - 10/21/20 Cardiac Cath - n/a  ICD Pacemaker/Loop - n/a  Sleep Study -  Yes (11/2017) BiPAP - uses but currently on oxygen 2L-4L via [place].  2L during the day and 4L at night.  Diabetes - n/a  Blood Thinner Instructions:  Last dose of Eliquis was on 09/01/23.  NPO  Anesthesia review: Yes  STOP now taking any Aspirin (unless otherwise instructed by your surgeon), Aleve, Naproxen, Ibuprofen, Motrin, Advil, Goody's, BC's, all herbal medications, fish oil, and all vitamins.   Coronavirus Screening Do you have any of the following symptoms:  Cough yes/no: No Fever (>100.19F)  yes/no: No Runny nose yes/no: No Sore throat yes/no: No Difficulty breathing/shortness of breath  yes/no: No  Have you traveled in the last 14 days and where? yes/no: No  Patient verbalized understanding of instructions that were given via phone.

## 2023-09-03 NOTE — Progress Notes (Signed)
Anesthesia Chart Review: Same day workup  57 year old male follows with cardiology at [facility] for history of HFpEF, HTN, paroxysmal A-fib on Eliquis.  Last seen by Dr. Aundra Dubin 07/30/2023.  Started on rosuvastatin 5 mg once daily, no other changes to management, updated echo ordered.  TTE 08/09/2023 showed EF 55%, normal RV systolic function, no significant valvular abnormalities.  History of severe OSA s/p tracheostomy 2001.  Reports he uses BiPAP at night with 4 L of supplemental O2 via [place].  Uses 2 L via [place] during the day.  CKD 5 not yet on HD.  EKG 08/18/2023 (Care Everywhere): Sinus rhythm with 1st degree AV block.  Rate 77. Left axis deviation. Right bundle branch block   TTE 08/09/2023: Summary   1. The left ventricle is normal in size with moderately increased wall  thickness.   2. The left ventricular systolic function is normal, LVEF is visually  estimated at 55%.    3. The right ventricle is upper normal in size, with normal systolic  function.     Jack Cox [facility] Stay [facility]/Anesthesiology Phone [phone] 09/03/2023 1:13 PM

## 2023-09-04 ENCOUNTER — Ambulatory Visit (HOSPITAL_COMMUNITY)
Admission: RE | Admit: 2023-09-04 | Discharge: 2023-09-04 | Disposition: A | Discharge status: Home / Self Care | Payer: Medicare Other | Attending: Vascular Surgery | Admitting: Vascular Surgery

## 2023-09-04 ENCOUNTER — Encounter (HOSPITAL_COMMUNITY): Payer: Self-pay | Admitting: Vascular Surgery

## 2023-09-04 ENCOUNTER — Encounter (HOSPITAL_COMMUNITY)
Admission: RE | Disposition: A | Discharge status: Home / Self Care | Payer: Self-pay | Source: Home / Self Care | Attending: Vascular Surgery

## 2023-09-04 ENCOUNTER — Ambulatory Visit (HOSPITAL_COMMUNITY): Payer: Medicare Other | Admitting: Physician Assistant

## 2023-09-04 ENCOUNTER — Other Ambulatory Visit: Payer: Self-pay

## 2023-09-04 ENCOUNTER — Ambulatory Visit (HOSPITAL_BASED_OUTPATIENT_CLINIC_OR_DEPARTMENT_OTHER): Payer: Medicare Other | Admitting: Physician Assistant

## 2023-09-04 DIAGNOSIS — Z7901 Long term (current) use of anticoagulants: Secondary | ICD-10-CM | POA: Diagnosis not present

## 2023-09-04 DIAGNOSIS — I509 Heart failure, unspecified: Secondary | ICD-10-CM

## 2023-09-04 DIAGNOSIS — Z9981 Dependence on supplemental oxygen: Secondary | ICD-10-CM | POA: Insufficient documentation

## 2023-09-04 DIAGNOSIS — Z6841 Body Mass Index (BMI) 40.0 and over, adult: Secondary | ICD-10-CM | POA: Insufficient documentation

## 2023-09-04 DIAGNOSIS — N185 Chronic kidney disease, stage 5: Secondary | ICD-10-CM | POA: Diagnosis not present

## 2023-09-04 DIAGNOSIS — I110 Hypertensive heart disease with heart failure: Secondary | ICD-10-CM

## 2023-09-04 DIAGNOSIS — N186 End stage renal disease: Secondary | ICD-10-CM | POA: Insufficient documentation

## 2023-09-04 DIAGNOSIS — I480 Paroxysmal atrial fibrillation: Secondary | ICD-10-CM | POA: Insufficient documentation

## 2023-09-04 DIAGNOSIS — Z930 Tracheostomy status: Secondary | ICD-10-CM | POA: Insufficient documentation

## 2023-09-04 DIAGNOSIS — G4730 Sleep apnea, unspecified: Secondary | ICD-10-CM | POA: Diagnosis not present

## 2023-09-04 DIAGNOSIS — I132 Hypertensive heart and chronic kidney disease with heart failure and with stage 5 chronic kidney disease, or end stage renal disease: Secondary | ICD-10-CM | POA: Diagnosis not present

## 2023-09-04 DIAGNOSIS — I4892 Unspecified atrial flutter: Secondary | ICD-10-CM | POA: Diagnosis not present

## 2023-09-04 DIAGNOSIS — I5033 Acute on chronic diastolic (congestive) heart failure: Secondary | ICD-10-CM | POA: Diagnosis not present

## 2023-09-04 HISTORY — DX: Pneumonia, unspecified organism: J18.9

## 2023-09-04 HISTORY — DX: Anemia, unspecified: D64.9

## 2023-09-04 HISTORY — DX: Gastro-esophageal reflux disease without esophagitis: K21.9

## 2023-09-04 HISTORY — PX: AV FISTULA PLACEMENT: SHX1204

## 2023-09-04 HISTORY — DX: Sleep apnea, unspecified: G47.30

## 2023-09-04 HISTORY — DX: Cardiac arrhythmia, unspecified: I49.9

## 2023-09-04 LAB — POCT I-STAT, CHEM 8
BUN: 56 mg/dL — ABNORMAL HIGH (ref 6–20)
Calcium, Ion: 1.11 mmol/L — ABNORMAL LOW (ref 1.15–1.40)
Chloride: 108 mmol/L (ref 98–111)
Creatinine, Ser: 6.8 mg/dL — ABNORMAL HIGH (ref 0.61–1.24)
Glucose, Bld: 89 mg/dL (ref 70–99)
HCT: 35 % — ABNORMAL LOW (ref 39.0–52.0)
Hemoglobin: 11.9 g/dL — ABNORMAL LOW (ref 13.0–17.0)
Potassium: 4.4 mmol/L (ref 3.5–5.1)
Sodium: 141 mmol/L (ref 135–145)
TCO2: 23 mmol/L (ref 22–32)

## 2023-09-04 SURGERY — ARTERIOVENOUS (AV) FISTULA CREATION
Anesthesia: Monitor Anesthesia Care | Site: Arm Lower | Laterality: Left

## 2023-09-04 MED ORDER — SODIUM CHLORIDE 0.9 % IV SOLN: INTRAVENOUS | Status: DC

## 2023-09-04 MED ORDER — HEPARIN 6000 UNIT IRRIGATION SOLUTION
Status: AC
  Filled 2023-09-04: qty 500

## 2023-09-04 MED ORDER — 0.9 % SODIUM CHLORIDE (POUR BTL) OPTIME
TOPICAL | Status: DC | PRN
  Administered 2023-09-04: 1000 mL

## 2023-09-04 MED ORDER — FENTANYL CITRATE (PF) 100 MCG/2ML IJ SOLN
INTRAMUSCULAR | Status: AC
  Administered 2023-09-04: 50 ug via INTRAVENOUS
  Filled 2023-09-04: qty 2

## 2023-09-04 MED ORDER — CHLORHEXIDINE GLUCONATE 4 % EX SOLN: 60.0000 mL | Freq: Once | CUTANEOUS | Status: DC

## 2023-09-04 MED ORDER — TRAMADOL HCL 50 MG PO TABS: 50.0000 mg | ORAL_TABLET | Freq: Four times a day (QID) | ORAL | 0 refills | Status: DC | PRN

## 2023-09-04 MED ORDER — DEXMEDETOMIDINE HCL IN NACL 80 MCG/20ML IV SOLN
INTRAVENOUS | Status: DC | PRN
  Administered 2023-09-04: 20 ug via INTRAVENOUS
  Administered 2023-09-04 (×2): 10 ug via INTRAVENOUS
  Administered 2023-09-04: 20 ug via INTRAVENOUS

## 2023-09-04 MED ORDER — FENTANYL CITRATE (PF) 100 MCG/2ML IJ SOLN: 50.0000 ug | Freq: Once | INTRAMUSCULAR | Status: AC

## 2023-09-04 MED ORDER — ORAL CARE MOUTH RINSE
15.0000 mL | Freq: Once | OROMUCOSAL | Status: AC
Start: 2023-09-04 — End: 2023-09-04

## 2023-09-04 MED ORDER — PROPOFOL 500 MG/50ML IV EMUL
INTRAVENOUS | Status: DC | PRN
  Administered 2023-09-04: 50 ug/kg/min via INTRAVENOUS

## 2023-09-04 MED ORDER — MIDAZOLAM HCL 2 MG/2ML IJ SOLN
INTRAMUSCULAR | Status: AC
  Filled 2023-09-04: qty 2

## 2023-09-04 MED ORDER — CEFAZOLIN IN SODIUM CHLORIDE 3-0.9 GM/100ML-% IV SOLN
3.0000 g | INTRAVENOUS | Status: AC
  Administered 2023-09-04: 3 g via INTRAVENOUS
  Filled 2023-09-04: qty 100

## 2023-09-04 MED ORDER — LIDOCAINE HCL 1 % IJ SOLN
INTRAMUSCULAR | Status: DC | PRN
  Administered 2023-09-04: 6 mL

## 2023-09-04 MED ORDER — CHLORHEXIDINE GLUCONATE 0.12 % MT SOLN
15.0000 mL | Freq: Once | OROMUCOSAL | Status: AC
Start: 2023-09-04 — End: 2023-09-04
  Administered 2023-09-04: 15 mL via OROMUCOSAL
  Filled 2023-09-04: qty 15

## 2023-09-04 MED ORDER — HEPARIN SODIUM (PORCINE) 1000 UNIT/ML IJ SOLN
INTRAMUSCULAR | Status: DC | PRN
  Administered 2023-09-04: 2000 [IU] via INTRAVENOUS

## 2023-09-04 MED ORDER — HEPARIN 6000 UNIT IRRIGATION SOLUTION
Status: DC | PRN
  Administered 2023-09-04: 1

## 2023-09-04 MED ORDER — LIDOCAINE HCL (PF) 1 % IJ SOLN
INTRAMUSCULAR | Status: AC
  Filled 2023-09-04: qty 30

## 2023-09-04 MED ORDER — DEXMEDETOMIDINE HCL IN NACL 80 MCG/20ML IV SOLN
INTRAVENOUS | Status: AC
  Filled 2023-09-04: qty 20

## 2023-09-04 MED ORDER — ROPIVACAINE HCL 5 MG/ML IJ SOLN
INTRAMUSCULAR | Status: DC | PRN
  Administered 2023-09-04: 30 mL via PERINEURAL

## 2023-09-04 MED ORDER — METOPROLOL TARTRATE 12.5 MG HALF TABLET
25.0000 mg | ORAL_TABLET | Freq: Once | ORAL | Status: AC
  Administered 2023-09-04: 25 mg via ORAL
  Filled 2023-09-04: qty 2

## 2023-09-04 SURGICAL SUPPLY — 38 items
ADH SKN CLS APL DERMABOND .7 (GAUZE/BANDAGES/DRESSINGS) ×1
ADH SKN CLS LQ APL DERMABOND (GAUZE/BANDAGES/DRESSINGS) ×1
ARMBAND PINK RESTRICT EXTREMIT (MISCELLANEOUS) ×1 IMPLANT
BAG COUNTER SPONGE SURGICOUNT (BAG) ×1 IMPLANT
BAG SPNG CNTER NS LX DISP (BAG) ×1
BLADE CLIPPER SURG (BLADE) ×1 IMPLANT
BNDG ELASTIC 4X5.8 VLCR STR LF (GAUZE/BANDAGES/DRESSINGS) ×1 IMPLANT
CANISTER SUCT 3000ML PPV (MISCELLANEOUS) ×1 IMPLANT
CLIP TI MEDIUM 6 (CLIP) ×2 IMPLANT
CLIP TI WIDE RED SMALL 6 (CLIP) ×1 IMPLANT
COVER PROBE W GEL 5X96 (DRAPES) ×1 IMPLANT
DERMABOND ADVANCED .7 DNX12 (GAUZE/BANDAGES/DRESSINGS) ×1 IMPLANT
DERMABOND ADVANCED .7 DNX6 (GAUZE/BANDAGES/DRESSINGS) IMPLANT
ELECT REM PT RETURN 9FT ADLT (ELECTROSURGICAL) ×1
ELECTRODE REM PT RTRN 9FT ADLT (ELECTROSURGICAL) ×1 IMPLANT
GLOVE BIOGEL PI IND STRL 7.0 (GLOVE) IMPLANT
GLOVE BIOGEL PI IND STRL 8 (GLOVE) ×1 IMPLANT
GOWN STRL REUS W/ TWL LRG LVL3 (GOWN DISPOSABLE) ×2 IMPLANT
GOWN STRL REUS W/TWL 2XL LVL3 (GOWN DISPOSABLE) ×2 IMPLANT
GOWN STRL REUS W/TWL LRG LVL3 (GOWN DISPOSABLE) ×2
KIT BASIN OR (CUSTOM PROCEDURE TRAY) ×1 IMPLANT
KIT TURNOVER KIT B (KITS) ×1 IMPLANT
NS IRRIG 1000ML POUR BTL (IV SOLUTION) ×1 IMPLANT
PACK CV ACCESS (CUSTOM PROCEDURE TRAY) ×1 IMPLANT
PAD ARMBOARD 7.5X6 YLW CONV (MISCELLANEOUS) ×2 IMPLANT
SLING ARM FOAM STRAP LRG (SOFTGOODS) IMPLANT
SLING ARM FOAM STRAP MED (SOFTGOODS) IMPLANT
SPIKE FLUID TRANSFER (MISCELLANEOUS) ×1 IMPLANT
SUT MNCRL AB 4-0 PS2 18 (SUTURE) ×1 IMPLANT
SUT PROLENE 6 0 BV (SUTURE) ×1 IMPLANT
SUT PROLENE 7 0 BV 1 (SUTURE) IMPLANT
SUT SILK 2 0 SH (SUTURE) IMPLANT
SUT SILK 3 0 SH CR/8 (SUTURE) ×1 IMPLANT
SUT VIC AB 3-0 SH 27 (SUTURE) ×1
SUT VIC AB 3-0 SH 27X BRD (SUTURE) ×1 IMPLANT
TOWEL GREEN STERILE (TOWEL DISPOSABLE) ×1 IMPLANT
UNDERPAD 30X36 HEAVY ABSORB (UNDERPADS AND DIAPERS) ×1 IMPLANT
WATER STERILE IRR 1000ML POUR (IV SOLUTION) ×1 IMPLANT

## 2023-09-04 NOTE — H&P (Signed)
Office Note   Patient seen and examined in preop holding.  No complaints. No changes to medication history or physical exam since last seen in clinic. After discussing the risks and benefits of left arm fistula, Jack Cox elected to proceed.   Jack Sparrow MD   CC: CKD 5 Requesting Provider:  No ref. provider found  HPI: Jack Cox is a Right handed 57 y.o. (1966/07/05) male with kidney disease who presents at the request of No ref. provider found for permanent HD access. The patient has had no prior access procedures.   On exam, Jack Cox was doing well.  A native of [place], he continues to live there with his brother.  He has a complex medical history including chronic tracheostomy for which he needed in the past, but now uses nasal cannula with 2 L supplemental O2 both during the day and at night.  He also previously had a feeding tube which has been removed.  He has used a wheelchair in the past, but walked into the office without an assist device.  Jack Cox had very few questions regarding dialysis and understood that he would likely need hemodialysis in the coming months.    Past Medical History:  Diagnosis Date   Anemia    CKD (chronic kidney disease)    Congestive heart failure (CHF) (HCC)    Dysrhythmia    a-fib   Essential hypertension    Fatigue    GERD (gastroesophageal reflux disease)    Gout    Hypercholesteremia    Insomnia    Pneumonia    Proteinuria    Renal insufficiency    Shortness of breath    Sleep apnea    use biPAP but currently on oxygen 2L-4L via [place]   Tracheostomy dependence (HCC)     Past Surgical History:  Procedure Laterality Date   IR GASTROSTOMY TUBE MOD SED  11/10/2020   TRACHELECTOMY     in place    Social History   Socioeconomic History   Marital status: Divorced    Spouse name: Not on file   Number of children: Not on file   Years of education: Not on file   Highest education level: Not on file   Occupational History   Not on file  Tobacco Use   Smoking status: Never   Smokeless tobacco: Never  Vaping Use   Vaping status: Never Used  Substance and Sexual Activity   Alcohol use: Not Currently    Comment: once in a while   Drug use: No   Sexual activity: Yes    Birth control/protection: None  Other Topics Concern   Not on file  Social History Narrative   Not on file   Social Determinants of Health   Financial Resource Strain: Patient Declined (08/18/2023)   Received from [facility]   Overall Financial Resource Strain (CARDIA)    Difficulty of Paying Living Expenses: Patient declined  Food Insecurity: No Food Insecurity (08/18/2023)   Received from [facility]   Hunger Vital Sign    Worried About Running Out of Food in the Last Year: Never true    Ran Out of Food in the Last Year: Never true  Transportation Needs: No Transportation Needs (08/18/2023)   Received from [facility]   PRAPARE - Transportation    Lack of Transportation (Medical): No    Lack of Transportation (Non-Medical): No  Physical Activity: Inactive (08/18/2023)   Received from [facility]  Exercise Vital Sign    Days of Exercise per Week: 0 days    Minutes of Exercise per Session: 0 min  Stress: No Stress Concern Present (08/18/2023)   Received from [facility] - Occupational Stress Questionnaire    Feeling of Stress : Not at all  Social Connections: Patient Declined (08/18/2023)   Received from [facility]   Social Connection and Isolation Panel [NHANES]    Frequency of Communication with Friends and Family: Patient declined    Frequency of Social Gatherings with Friends and Family: Patient declined    Attends Religious Services: Patient declined    Database administrator or Organizations: Patient declined    Attends Banker Meetings: Patient declined    Marital Status: Patient declined  Intimate Partner  Violence: Patient Declined (08/18/2023)   Received from [facility]   Humiliation, Afraid, Rape, and Kick questionnaire    Fear of Current or Ex-Partner: Patient declined    Emotionally Abused: Patient declined    Physically Abused: Patient declined    Sexually Abused: Patient declined   Family History  Problem Relation Age of Onset   Stomach cancer Mother    Colon cancer Father     Current Facility-Administered Medications  Medication Dose Route Frequency Provider Last Rate Last Admin   0.9 %  sodium chloride infusion   Intravenous Continuous Mal Amabile, MD       ceFAZolin (ANCEF) IVPB 3g/100 mL premix  3 g Intravenous 30 min Pre-Op Jack Sparrow, MD       chlorhexidine (HIBICLENS) 4 % liquid 4 Application  60 mL Topical Once Jack Sparrow, MD       And   [START ON 09/05/2023] chlorhexidine (HIBICLENS) 4 % liquid 4 Application  60 mL Topical Once Jack Sparrow, MD       fentaNYL (SUBLIMAZE) 100 MCG/2ML injection            midazolam (VERSED) 2 MG/2ML injection             No Known Allergies   REVIEW OF SYSTEMS:  [X]  denotes positive finding, [ ]  denotes negative finding Cardiac  Comments:  Chest pain or chest pressure:    Shortness of breath upon exertion:    Short of breath when lying flat:    Irregular heart rhythm:        Vascular    Pain in calf, thigh, or hip brought on by ambulation:    Pain in feet at night that wakes you up from your sleep:     Blood clot in your veins:    Leg swelling:         Pulmonary    Oxygen at home:    Productive cough:     Wheezing:         Neurologic    Sudden weakness in arms or legs:     Sudden numbness in arms or legs:     Sudden onset of difficulty speaking or slurred speech:    Temporary loss of vision in one eye:     Problems with dizziness:         Gastrointestinal    Blood in stool:     Vomited blood:         Genitourinary    Burning when urinating:     Blood in urine:        Psychiatric     Major depression:  Hematologic    Bleeding problems:    Problems with blood clotting too easily:        Skin    Rashes or ulcers:        Constitutional    Fever or chills:      PHYSICAL EXAMINATION:  Vitals:   09/03/23 1007 09/04/23 0656  BP:  (!) 164/96  Pulse:  73  Resp:  18  Temp:  97.7 F (36.5 C)  TempSrc:  Oral  SpO2:  96%  Weight: 123.8 kg 123.8 kg  Height: 5\' 7"  (1.702 m) 5\' 7"  (1.702 m)    General:  WDWN in NAD; vital signs documented above Gait: Not observed HENT: WNL, normocephalic Pulmonary: normal non-labored breathing , without Rales, rhonchi,  wheezing Cardiac: regular HR, Abdomen: soft, NT, no masses Skin: without rashes Vascular Exam/Pulses:  Right Left  Radial 2+ (normal) 2+ (normal)                       Extremities: without ischemic changes, without Gangrene , without cellulitis; without open wounds;  Musculoskeletal: no muscle wasting or atrophy  Neurologic: A&O X 3;  No focal weakness or paresthesias are detected Psychiatric:  The pt has Normal affect.   Non-Invasive Vascular Imaging:   See ultrasound study    ASSESSMENT/PLAN:  Jahier Lunday is a 57 y.o. male who presents with chronic kidney disease stage 5  Based on vein mapping and examination, Maynard is a candidate for fistula creation and bilateral upper extremities Based on vein mapping and being right arm dominant, we discussed left arm brachiocephalic versus left arm brachiobasilic fistula creation in the coming weeks. I had an extensive discussion with this patient in regards to the nature of access surgery, including risk, benefits, and alternatives.   The patient is aware that the risks of access surgery include but are not limited to: bleeding, infection, steal syndrome, nerve damage, ischemic monomelic neuropathy, failure of access to mature, complications related to venous hypertension, and possible need for additional access procedures in the future. I  discussed with the patient the nature of the staged access procedure, specifically the need for a second operation to transpose the first stage fistula if it matures adequately.   The patient has agreed to proceed with the above procedure which will be scheduled  Eliquis for a flutter, this will be held 2 days prior to the procedure.  Jack Sparrow, MD [facility] [phone]

## 2023-09-04 NOTE — Anesthesia Postprocedure Evaluation (Signed)
Anesthesia Post Note  Patient: Ned Fioretti  Procedure(s) Performed: LEFT ARM Brachial Cephalic ARTERIOVENOUS (AV) FISTULA CREATION (Left: Arm Lower)     Patient location during evaluation: PACU Anesthesia Type: Regional and MAC Level of consciousness: awake and alert and oriented Pain management: pain level controlled Vital Signs Assessment: post-procedure vital signs reviewed and stable Respiratory status: spontaneous breathing, nonlabored ventilation, respiratory function stable and patient connected to nasal cannula oxygen Cardiovascular status: stable and blood pressure returned to baseline Postop Assessment: no apparent nausea or vomiting Anesthetic complications: no   No notable events documented.  Last Vitals:  Vitals:   09/04/23 1046 09/04/23 1100  BP: (!) 137/90 135/67  Pulse: 62 62  Resp: 18 20  Temp: 36.6 C   SpO2: 96% 94%    Last Pain:  Vitals:   09/04/23 1100  TempSrc:   PainSc: 0-No pain    LLE Motor Response: No movement due to regional block (09/04/23 1100)            Chyrel Taha A.

## 2023-09-04 NOTE — Anesthesia Procedure Notes (Signed)
Anesthesia Regional Block: Supraclavicular block   Pre-Anesthetic Checklist: , timeout performed,  Correct Patient, Correct Site, Correct Laterality,  Correct Procedure, Correct Position, site marked,  Risks and benefits discussed,  Surgical consent,  Pre-op evaluation,  At surgeon's request and post-op pain management  Laterality: Left  Prep: chloraprep       Needles:  Injection technique: Single-shot  Needle Type: Echogenic Stimulator Needle     Needle Length: 10cm  Needle Gauge: 21   Needle insertion depth: 7 cm   Additional Needles:   Procedures:,,,, ultrasound used (permanent image in chart),,   Motor weakness within 5 minutes.  Narrative:  Start time: 09/04/2023 9:10 AM End time: 09/04/2023 9:15 AM Injection made incrementally with aspirations every 5 mL.  Performed by: Personally  Anesthesiologist: Mal Amabile, MD  Additional Notes: Timeout performed. Patient sedated. Relevant anatomy ID'd using [place]. Incremental 2-35ml injection of LA with frequent aspiration. Patient tolerated procedure well.     Left Supraclavicular Block

## 2023-09-04 NOTE — Transfer of Care (Signed)
Immediate Anesthesia Transfer of Care Note  Patient: Jack Cox  Procedure(s) Performed: LEFT ARM Brachial Cephalic ARTERIOVENOUS (AV) FISTULA CREATION (Left: Arm Lower)  Patient Location: PACU  Anesthesia Type:MAC and Regional  Level of Consciousness: drowsy  Airway & Oxygen Therapy:   Post-op Assessment: Report given to RN and Post -op Vital signs reviewed and stable  Post vital signs: Reviewed and stable  Last Vitals:  Vitals Value Taken Time  BP 137/90 09/04/23 1046  Temp 36.6 C 09/04/23 1046  Pulse 62 09/04/23 1059  Resp 20 09/04/23 1059  SpO2 94 % 09/04/23 1059  Vitals shown include unfiled device data.  Last Pain:  Vitals:   09/04/23 1046  TempSrc:   PainSc: 0-No pain         Complications: No notable events documented.

## 2023-09-04 NOTE — Op Note (Signed)
NAME: Deloris Poarch    MRN: [id number] DOB: 1966-05-17    DATE OF OPERATION: 09/04/2023  PREOP DIAGNOSIS:    Chronic kidney disease stage 5  POSTOP DIAGNOSIS:    Same  PROCEDURE:    Left arm brachiocephalic fistula   SURGEON: Victorino Sparrow  ASSIST: Nathanial Rancher, PA  ANESTHESIA: Block, moderate  EBL: 10ml  INDICATIONS:    Vishruth Tumolo is a 57 y.o. male with CKD5 in need of long term HD access. After discussing left arm brachiocephalic fistula creation, Hannes elected to proceed.   FINDINGS:    5mm cephalic vein 5mm Brachial artery  TECHNIQUE:   The patient was brought to the operating room and placed in supine position. The left arm was prepped and draped in standard fashion. IV antibiotics were administered. A timeout was performed.   The case began with ultrasound insonation of the brachial artery and cephalic vein, which demonstrated sufficient size at the antecubital fossa for arteriovenous fistula.   A transverse incision was made below the elbow creese in the antecubital fossa. The  cephalic vein was isolated for 3 cm in length. Next the aponeurosis was partially released and the brachial artery secured with a vessel loop. The patient was heparinized. The cephalic vein was transected and ligated distally with a 2-0 silk stick-tie. The vein was dilated with coronary dilators and flushed with heparin saline. Vascular clamps were placed proximally and distally on the brachial artery and a 5 mm arteriotomy was created on the brachial artery. This was flushed with heparin saline.  An anastomosis was created in end to side fashion on the brachial artery using running 6-0 Prolene suture.  Prior to completing the anastomosis, the vessels were flushed and the suture line was tied down. There was an excellent thrill in the cephalic vein from the anastomosis to the mid upper bicipital region. The patient had a multiphasic radial and ulnar signal. He had an excellent  doppler signal in the fistula. The incision was irrigated and hemostasis achieved with cautery and suture. The deeper tissue was closed with 3-0 Vicryl and the skin closed with 4-0 Monocryl.  Dermabond was applied to the incisions. The patient was transferred to PACU in stable condition.  Given the complexity of the case,  the assistant was necessary in order to expedient the procedure and safely perform the technical aspects of the operation.  The assistant provided traction and countertraction to assist with exposure of the artery and vein.  They also assisted with suture ligation of multiple venous branches.  They played a critical role in the anastomosis. These skills, especially following the Prolene suture for the anastomosis, could not have been adequately performed by a scrub tech assistant.    Victorino Sparrow, MD [facility] DATE OF DICTATION:   09/04/2023

## 2023-09-04 NOTE — Discharge Instructions (Signed)
[facility]  Discharge Instructions  AV Fistula or Graft Surgery for Dialysis Access  Please refer to the following instructions for your post-procedure care. Your surgeon or physician assistant will discuss any changes with you.  Activity  You may drive the day following your surgery, if you are comfortable and no longer taking prescription pain medication. Resume full activity as the soreness in your incision resolves.  Bathing/Showering  You may shower after you go home. Keep your incision dry for 48 hours. Do not soak in a bathtub, hot tub, or swim until the incision heals completely. You may not shower if you have a hemodialysis catheter.  Incision Care  Clean your incision with mild soap and water after 48 hours. Pat the area dry with a clean towel. You do not need a bandage unless otherwise instructed. Do not apply any ointments or creams to your incision. You may have skin glue on your incision. Do not peel it off. It will come off on its own in about one week. Your arm may swell a bit after surgery. To reduce swelling use pillows to elevate your arm so it is above your heart. Your doctor will tell you if you need to lightly wrap your arm with an ACE bandage.  Diet  Resume your normal diet. There are not special food restrictions following this procedure. In order to heal from your surgery, it is CRITICAL to get adequate nutrition. Your body requires vitamins, minerals, and protein. Vegetables are the best source of vitamins and minerals. Vegetables also provide the perfect balance of protein. Processed food has little nutritional value, so try to avoid this.  Medications  Resume taking all of your medications. If your incision is causing pain, you may take over-the counter pain relievers such as acetaminophen (Tylenol). If you were prescribed a stronger pain medication, please be aware these medications can cause nausea and constipation. Prevent  nausea by taking the medication with a snack or meal. Avoid constipation by drinking plenty of fluids and eating foods with high amount of fiber, such as fruits, vegetables, and grains.  Do not take Tylenol if you are taking prescription pain medications.  Follow up Your surgeon may want to see you in the office following your access surgery. If so, this will be arranged at the time of your surgery.  Please call us immediately for any of the following conditions:  Increased pain, redness, drainage (pus) from your incision site Fever of 101 degrees or higher Severe or worsening pain at your incision site Hand pain or numbness.  Reduce your risk of vascular disease:  Stop smoking. If you would like help, call [facility] at 1-800-QUIT-NOW (229-334-8909) or [facility] at 832-599-1936  Manage your cholesterol Maintain a desired weight Control your diabetes Keep your blood pressure down  Dialysis  It will take several weeks to several months for your new dialysis access to be ready for use. Your surgeon will determine when it is okay to use it. Your nephrologist will continue to direct your dialysis. You can continue to use your Permcath until your new access is ready for use.   09/04/2023 Jack Cox [id number] September 25, 1966  Surgeon(s): Victorino Sparrow, MD  Procedure(s): LEFT ARM Brachial Cephalic ARTERIOVENOUS (AV) FISTULA CREATION   May stick graft immediately   May stick graft on designated area only:   X Do not stick left AV fistula for 12 weeks    If you have any questions, please call the  office at [phone].

## 2023-09-05 ENCOUNTER — Encounter (HOSPITAL_COMMUNITY): Payer: Self-pay | Admitting: Vascular Surgery

## 2023-09-10 DIAGNOSIS — I498 Other specified cardiac arrhythmias: Secondary | ICD-10-CM | POA: Diagnosis not present

## 2023-09-10 DIAGNOSIS — G4733 Obstructive sleep apnea (adult) (pediatric): Secondary | ICD-10-CM | POA: Diagnosis not present

## 2023-09-12 DIAGNOSIS — Z430 Encounter for attention to tracheostomy: Secondary | ICD-10-CM | POA: Diagnosis not present

## 2023-09-12 DIAGNOSIS — Z930 Tracheostomy status: Secondary | ICD-10-CM | POA: Diagnosis not present

## 2023-09-14 DIAGNOSIS — G4733 Obstructive sleep apnea (adult) (pediatric): Secondary | ICD-10-CM | POA: Diagnosis not present

## 2023-09-14 DIAGNOSIS — R55 Syncope and collapse: Secondary | ICD-10-CM | POA: Diagnosis not present

## 2023-09-14 DIAGNOSIS — I480 Paroxysmal atrial fibrillation: Secondary | ICD-10-CM | POA: Diagnosis not present

## 2023-09-14 DIAGNOSIS — I5030 Unspecified diastolic (congestive) heart failure: Secondary | ICD-10-CM | POA: Diagnosis not present

## 2023-09-14 DIAGNOSIS — N186 End stage renal disease: Secondary | ICD-10-CM | POA: Diagnosis not present

## 2023-09-14 DIAGNOSIS — I10 Essential (primary) hypertension: Secondary | ICD-10-CM | POA: Diagnosis not present

## 2023-09-14 DIAGNOSIS — I5032 Chronic diastolic (congestive) heart failure: Secondary | ICD-10-CM | POA: Diagnosis not present

## 2023-09-17 DIAGNOSIS — I442 Atrioventricular block, complete: Secondary | ICD-10-CM | POA: Diagnosis not present

## 2023-09-17 DIAGNOSIS — I491 Atrial premature depolarization: Secondary | ICD-10-CM | POA: Diagnosis not present

## 2023-09-21 ENCOUNTER — Other Ambulatory Visit: Payer: Self-pay | Admitting: *Deleted

## 2023-09-21 DIAGNOSIS — N185 Chronic kidney disease, stage 5: Secondary | ICD-10-CM

## 2023-09-27 DIAGNOSIS — N2581 Secondary hyperparathyroidism of renal origin: Secondary | ICD-10-CM | POA: Diagnosis not present

## 2023-09-27 DIAGNOSIS — I10 Essential (primary) hypertension: Secondary | ICD-10-CM | POA: Diagnosis not present

## 2023-09-27 DIAGNOSIS — N185 Chronic kidney disease, stage 5: Secondary | ICD-10-CM | POA: Diagnosis not present

## 2023-09-27 DIAGNOSIS — R600 Localized edema: Secondary | ICD-10-CM | POA: Diagnosis not present

## 2023-09-27 DIAGNOSIS — N269 Renal sclerosis, unspecified: Secondary | ICD-10-CM | POA: Diagnosis not present

## 2023-09-27 DIAGNOSIS — I129 Hypertensive chronic kidney disease with stage 1 through stage 4 chronic kidney disease, or unspecified chronic kidney disease: Secondary | ICD-10-CM | POA: Diagnosis not present

## 2023-10-01 DIAGNOSIS — Z299 Encounter for prophylactic measures, unspecified: Secondary | ICD-10-CM | POA: Diagnosis not present

## 2023-10-01 DIAGNOSIS — E1122 Type 2 diabetes mellitus with diabetic chronic kidney disease: Secondary | ICD-10-CM | POA: Diagnosis not present

## 2023-10-01 DIAGNOSIS — N185 Chronic kidney disease, stage 5: Secondary | ICD-10-CM | POA: Diagnosis not present

## 2023-10-01 DIAGNOSIS — J449 Chronic obstructive pulmonary disease, unspecified: Secondary | ICD-10-CM | POA: Diagnosis not present

## 2023-10-01 DIAGNOSIS — I509 Heart failure, unspecified: Secondary | ICD-10-CM | POA: Diagnosis not present

## 2023-10-01 DIAGNOSIS — Z0000 Encounter for general adult medical examination without abnormal findings: Secondary | ICD-10-CM | POA: Diagnosis not present

## 2023-10-01 DIAGNOSIS — J9611 Chronic respiratory failure with hypoxia: Secondary | ICD-10-CM | POA: Diagnosis not present

## 2023-10-01 DIAGNOSIS — I10 Essential (primary) hypertension: Secondary | ICD-10-CM | POA: Diagnosis not present

## 2023-10-08 DIAGNOSIS — I129 Hypertensive chronic kidney disease with stage 1 through stage 4 chronic kidney disease, or unspecified chronic kidney disease: Secondary | ICD-10-CM | POA: Diagnosis not present

## 2023-10-08 DIAGNOSIS — N185 Chronic kidney disease, stage 5: Secondary | ICD-10-CM | POA: Diagnosis not present

## 2023-10-08 DIAGNOSIS — N2581 Secondary hyperparathyroidism of renal origin: Secondary | ICD-10-CM | POA: Diagnosis not present

## 2023-10-08 DIAGNOSIS — I10 Essential (primary) hypertension: Secondary | ICD-10-CM | POA: Diagnosis not present

## 2023-10-08 DIAGNOSIS — N269 Renal sclerosis, unspecified: Secondary | ICD-10-CM | POA: Diagnosis not present

## 2023-10-09 ENCOUNTER — Ambulatory Visit (HOSPITAL_COMMUNITY): Payer: Medicare Other

## 2023-10-10 DIAGNOSIS — G4733 Obstructive sleep apnea (adult) (pediatric): Secondary | ICD-10-CM | POA: Diagnosis not present

## 2023-11-08 ENCOUNTER — Ambulatory Visit (HOSPITAL_COMMUNITY)
Admission: RE | Admit: 2023-11-08 | Discharge: 2023-11-08 | Disposition: A | Discharge status: Home / Self Care | Payer: Medicare Other | Source: Ambulatory Visit | Attending: Vascular Surgery | Admitting: Vascular Surgery

## 2023-11-08 ENCOUNTER — Ambulatory Visit (INDEPENDENT_AMBULATORY_CARE_PROVIDER_SITE_OTHER): Payer: Medicare Other | Admitting: Physician Assistant

## 2023-11-08 VITALS — BP 126/78 | HR 71 | Temp 98.0°F | Resp 24 | Ht 67.0 in | Wt 268.8 lb

## 2023-11-08 DIAGNOSIS — N185 Chronic kidney disease, stage 5: Secondary | ICD-10-CM | POA: Diagnosis not present

## 2023-11-08 NOTE — Progress Notes (Signed)
 POST OPERATIVE OFFICE NOTE    CC:  F/u for surgery  HPI: Jack Cox is a 58 y.o. male who is s/p creation of left arm brachiocephalic fistula by Dr.Robins on 09/04/2023. This was done for future dialysis access.   Pt returns today for follow up.  Pt states he has felt fine since surgery. He denies any issues with his incision. He denies any symptoms of steal in the left hand such as weakness, numbness, excessive coldness, or pain.  He is still not on dialysis yet and has no set start date.   No Known Allergies  Current Outpatient Medications  Medication Sig Dispense Refill   allopurinol (ZYLOPRIM) 300 MG tablet Take 300 mg by mouth daily.     amLODipine (NORVASC) 10 MG tablet Take 10 mg by mouth daily.     apixaban  (ELIQUIS ) 5 MG TABS tablet Place 1 tablet (5 mg total) into feeding tube 2 (two) times daily. (Patient taking differently: Take 5 mg by mouth 2 (two) times daily.) 60 tablet    calcitRIOL (ROCALTROL) 0.25 MCG capsule Take 0.25 mcg by mouth daily.     Cholecalciferol (VITAMIN D) 50 MCG (2000 UT) tablet Take 2,000 Units by mouth daily.     cloNIDine  (CATAPRES ) 0.3 MG tablet Take 0.3 mg by mouth 3 (three) times daily.     ferrous sulfate 325 (65 FE) MG tablet Take 325 mg by mouth daily.     hydrALAZINE (APRESOLINE) 100 MG tablet Take 100 mg by mouth 3 (three) times daily.     losartan (COZAAR) 25 MG tablet Take 25 mg by mouth daily.     metoprolol  tartrate (LOPRESSOR ) 25 MG tablet Place 1 tablet (25 mg total) into feeding tube 2 (two) times daily. (Patient taking differently: Take 25 mg by mouth 2 (two) times daily.)     pantoprazole  (PROTONIX ) 40 MG tablet Take 40 mg by mouth daily.     promethazine (PHENERGAN) 12.5 MG tablet Take 12.5 mg by mouth every 8 (eight) hours as needed for vomiting or nausea.     sodium zirconium cyclosilicate (LOKELMA) 10 g PACK packet Take 10 g by mouth every Monday, Wednesday, and Friday.     torsemide (DEMADEX) 20 MG tablet Take 20 mg by  mouth 2 (two) times daily.     traMADol  (ULTRAM ) 50 MG tablet Take 1 tablet (50 mg total) by mouth every 6 (six) hours as needed for moderate pain (pain score 4-6). 12 tablet 0   No current facility-administered medications for this visit.     ROS:  See HPI  Physical Exam:  Incision:  left upper arm incision well healed without dehiscence, drainage, or hematoma Extremities:  left brachiocephalic fistula with great thrill on palpation throughout the upper arm. Palpable left radial pulse Neuro: intact motor and sensation of LUE  Dialysis Duplex (11/07/2022) +--------------------+----------+-----------------+--------+  AVF                PSV (cm/s)Flow Vol (mL/min)Comments  +--------------------+----------+-----------------+--------+  Native artery inflow   243          1047                 +--------------------+----------+-----------------+--------+  AVF Anastomosis        198                               +--------------------+----------+-----------------+--------+     +------------+----------+-------------+----------+-------------------------  ----+  OUTFLOW VEINPSV (cm/s)Diameter (cm)Depth (cm)  Describe              +------------+----------+-------------+----------+-------------------------  ----+  Shoulder      117        0.65        0.70                                   +------------+----------+-------------+----------+-------------------------  ----+  Prox UA        143        0.60        0.64                                   +------------+----------+-------------+----------+-------------------------  ----+  Mid UA         476        0.77        0.30          Retained valve?          +------------+----------+-------------+----------+-------------------------  ----+  Dist UA        665        0.70        0.38    Retained valve and  elevated                                                 velocites 187 cm/s-665   cm/s                                                        (Ratio: 3.6)            +------------+----------+-------------+----------+-------------------------  ----+  AC Fossa       293        1.00        0.16                                   +------------+----------+-------------+----------+-------------------------     Assessment/Plan:  This is a 58 y.o. male who is here for a post op visit  -The patient's left arm incision is well healed without signs of infection or hematoma -Duplex demonstrates a well maturing fistula with flow volumes of 1047 ml/min. The fistula is fairly superficial in the arm and has diameters > 6mm -On exam his fistula has a great thrill in the upper arm. He has a palpable left radial pulse -He denies any symptoms of steal. He has intact motor and sensation of the left hand -Should he need to start dialysis soon, his fistula should be ready for use by 11/27/2023 -He can follow up with our office as needed   [name], PA-C [facility] [phone]   Clinic MD:  [place]

## 2023-12-19 DIAGNOSIS — G4733 Obstructive sleep apnea (adult) (pediatric): Secondary | ICD-10-CM | POA: Diagnosis not present

## 2023-12-19 DIAGNOSIS — I10 Essential (primary) hypertension: Secondary | ICD-10-CM | POA: Diagnosis not present

## 2023-12-19 DIAGNOSIS — I471 Supraventricular tachycardia, unspecified: Secondary | ICD-10-CM | POA: Diagnosis not present

## 2023-12-19 DIAGNOSIS — R55 Syncope and collapse: Secondary | ICD-10-CM | POA: Diagnosis not present

## 2023-12-19 DIAGNOSIS — N185 Chronic kidney disease, stage 5: Secondary | ICD-10-CM | POA: Diagnosis not present

## 2023-12-19 DIAGNOSIS — I5032 Chronic diastolic (congestive) heart failure: Secondary | ICD-10-CM | POA: Diagnosis not present

## 2024-01-01 DIAGNOSIS — Z299 Encounter for prophylactic measures, unspecified: Secondary | ICD-10-CM | POA: Diagnosis not present

## 2024-01-01 DIAGNOSIS — I10 Essential (primary) hypertension: Secondary | ICD-10-CM | POA: Diagnosis not present

## 2024-01-01 DIAGNOSIS — R0602 Shortness of breath: Secondary | ICD-10-CM | POA: Diagnosis not present

## 2024-01-01 DIAGNOSIS — I509 Heart failure, unspecified: Secondary | ICD-10-CM | POA: Diagnosis not present

## 2024-01-24 DIAGNOSIS — N269 Renal sclerosis, unspecified: Secondary | ICD-10-CM | POA: Diagnosis not present

## 2024-01-24 DIAGNOSIS — N185 Chronic kidney disease, stage 5: Secondary | ICD-10-CM | POA: Diagnosis not present

## 2024-01-24 DIAGNOSIS — I129 Hypertensive chronic kidney disease with stage 1 through stage 4 chronic kidney disease, or unspecified chronic kidney disease: Secondary | ICD-10-CM | POA: Diagnosis not present

## 2024-01-24 DIAGNOSIS — E875 Hyperkalemia: Secondary | ICD-10-CM | POA: Diagnosis not present

## 2024-01-24 DIAGNOSIS — N2581 Secondary hyperparathyroidism of renal origin: Secondary | ICD-10-CM | POA: Diagnosis not present

## 2024-01-24 DIAGNOSIS — I10 Essential (primary) hypertension: Secondary | ICD-10-CM | POA: Diagnosis not present

## 2024-02-05 DIAGNOSIS — N2581 Secondary hyperparathyroidism of renal origin: Secondary | ICD-10-CM | POA: Diagnosis not present

## 2024-02-05 DIAGNOSIS — I10 Essential (primary) hypertension: Secondary | ICD-10-CM | POA: Diagnosis not present

## 2024-02-05 DIAGNOSIS — I129 Hypertensive chronic kidney disease with stage 1 through stage 4 chronic kidney disease, or unspecified chronic kidney disease: Secondary | ICD-10-CM | POA: Diagnosis not present

## 2024-02-05 DIAGNOSIS — N185 Chronic kidney disease, stage 5: Secondary | ICD-10-CM | POA: Diagnosis not present

## 2024-02-05 DIAGNOSIS — N269 Renal sclerosis, unspecified: Secondary | ICD-10-CM | POA: Diagnosis not present

## 2024-02-20 DIAGNOSIS — I129 Hypertensive chronic kidney disease with stage 1 through stage 4 chronic kidney disease, or unspecified chronic kidney disease: Secondary | ICD-10-CM | POA: Diagnosis not present

## 2024-02-20 DIAGNOSIS — N2581 Secondary hyperparathyroidism of renal origin: Secondary | ICD-10-CM | POA: Diagnosis not present

## 2024-02-20 DIAGNOSIS — N051 Unspecified nephritic syndrome with focal and segmental glomerular lesions: Secondary | ICD-10-CM | POA: Diagnosis not present

## 2024-02-20 DIAGNOSIS — N185 Chronic kidney disease, stage 5: Secondary | ICD-10-CM | POA: Diagnosis not present

## 2024-02-20 DIAGNOSIS — I10 Essential (primary) hypertension: Secondary | ICD-10-CM | POA: Diagnosis not present

## 2024-03-05 DIAGNOSIS — N186 End stage renal disease: Secondary | ICD-10-CM | POA: Diagnosis not present

## 2024-03-05 DIAGNOSIS — Z992 Dependence on renal dialysis: Secondary | ICD-10-CM | POA: Diagnosis not present

## 2024-03-14 ENCOUNTER — Other Ambulatory Visit: Payer: Self-pay

## 2024-03-14 DIAGNOSIS — N186 End stage renal disease: Secondary | ICD-10-CM

## 2024-03-17 ENCOUNTER — Ambulatory Visit (HOSPITAL_COMMUNITY)
Admission: RE | Admit: 2024-03-17 | Discharge: 2024-03-17 | Disposition: A | Discharge status: Home / Self Care | Attending: Vascular Surgery | Admitting: Vascular Surgery

## 2024-03-17 ENCOUNTER — Other Ambulatory Visit: Payer: Self-pay

## 2024-03-17 ENCOUNTER — Encounter (HOSPITAL_COMMUNITY): Payer: Self-pay | Admitting: Vascular Surgery

## 2024-03-17 ENCOUNTER — Encounter (HOSPITAL_COMMUNITY)
Admission: RE | Disposition: A | Discharge status: Home / Self Care | Payer: Self-pay | Source: Home / Self Care | Attending: Vascular Surgery

## 2024-03-17 DIAGNOSIS — I509 Heart failure, unspecified: Secondary | ICD-10-CM | POA: Diagnosis not present

## 2024-03-17 DIAGNOSIS — N186 End stage renal disease: Secondary | ICD-10-CM

## 2024-03-17 DIAGNOSIS — T82858A Stenosis of vascular prosthetic devices, implants and grafts, initial encounter: Secondary | ICD-10-CM | POA: Insufficient documentation

## 2024-03-17 DIAGNOSIS — T82898A Other specified complication of vascular prosthetic devices, implants and grafts, initial encounter: Secondary | ICD-10-CM | POA: Diagnosis not present

## 2024-03-17 DIAGNOSIS — Z992 Dependence on renal dialysis: Secondary | ICD-10-CM

## 2024-03-17 DIAGNOSIS — I132 Hypertensive heart and chronic kidney disease with heart failure and with stage 5 chronic kidney disease, or end stage renal disease: Secondary | ICD-10-CM | POA: Diagnosis not present

## 2024-03-17 DIAGNOSIS — I871 Compression of vein: Secondary | ICD-10-CM | POA: Diagnosis not present

## 2024-03-17 DIAGNOSIS — Y832 Surgical operation with anastomosis, bypass or graft as the cause of abnormal reaction of the patient, or of later complication, without mention of misadventure at the time of the procedure: Secondary | ICD-10-CM | POA: Insufficient documentation

## 2024-03-17 HISTORY — PX: A/V FISTULAGRAM: CATH118298

## 2024-03-17 LAB — POCT I-STAT, CHEM 8
BUN: 61 mg/dL — ABNORMAL HIGH (ref 6–20)
Calcium, Ion: 1.17 mmol/L (ref 1.15–1.40)
Chloride: 110 mmol/L (ref 98–111)
Creatinine, Ser: 8.1 mg/dL — ABNORMAL HIGH (ref 0.61–1.24)
Glucose, Bld: 97 mg/dL (ref 70–99)
HCT: 28 % — ABNORMAL LOW (ref 39.0–52.0)
Hemoglobin: 9.5 g/dL — ABNORMAL LOW (ref 13.0–17.0)
Potassium: 4.6 mmol/L (ref 3.5–5.1)
Sodium: 145 mmol/L (ref 135–145)
TCO2: 23 mmol/L (ref 22–32)

## 2024-03-17 SURGERY — A/V FISTULAGRAM
Anesthesia: LOCAL

## 2024-03-17 MED ORDER — HEPARIN (PORCINE) IN NACL 1000-0.9 UT/500ML-% IV SOLN
INTRAVENOUS | Status: DC | PRN
  Administered 2024-03-17: 500 mL

## 2024-03-17 MED ORDER — SODIUM CHLORIDE 0.9% FLUSH: 3.0000 mL | INTRAVENOUS | Status: DC | PRN

## 2024-03-17 MED ORDER — LIDOCAINE HCL (PF) 1 % IJ SOLN
INTRAMUSCULAR | Status: DC | PRN
  Administered 2024-03-17: 10 mL

## 2024-03-17 MED ORDER — IODIXANOL 320 MG/ML IV SOLN
INTRAVENOUS | Status: DC | PRN
  Administered 2024-03-17: 30 mL

## 2024-03-17 SURGICAL SUPPLY — 11 items
BALLOON LUTONIX AV 8X60X75 (BALLOONS) IMPLANT
COVER DOME SNAP 22 D (MISCELLANEOUS) ×1 IMPLANT
KIT ENCORE 26 ADVANTAGE (KITS) IMPLANT
KIT MICROPUNCTURE NIT STIFF (SHEATH) IMPLANT
SHEATH PINNACLE R/O II 6F 4CM (SHEATH) IMPLANT
SHEATH PROBE COVER 6X72 (BAG) IMPLANT
STATION PROTECTION PRESSURIZED (MISCELLANEOUS) ×1 IMPLANT
STOPCOCK MORSE 400PSI 3WAY (MISCELLANEOUS) ×1 IMPLANT
TRAY PV CATH (CUSTOM PROCEDURE TRAY) ×1 IMPLANT
TUBING CIL FLEX 10 FLL-RA (TUBING) ×1 IMPLANT
WIRE BENTSON .035X145CM (WIRE) IMPLANT

## 2024-03-17 NOTE — H&P (Addendum)
 H&P  MRN #:  [id number]  History of Present Illness: Mr. Jack Cox is a 58 year old male with ESRD on HD via a left upper extremity brachiocephalic fistula. He has been having issues with dialysis sessions and a duplex demonstrated an outflow vein stenosis.   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[facility])     Past Surgical History:  Procedure Laterality Date   A/V FISTULAGRAM N/A 03/17/2024   Procedure: A/V Fistulagram;  Surgeon: Jack Brawn, MD;  Location: [facility] INVASIVE CV LAB;  Service: Cardiovascular;  Laterality: N/A;   AV FISTULA PLACEMENT Left 09/04/2023   Procedure: LEFT ARM Brachial Cephalic ARTERIOVENOUS (AV) FISTULA CREATION;  Surgeon: Jack Part, MD;  Location: [facility] OR;  Service: Vascular;  Laterality: Left;   IR GASTROSTOMY TUBE MOD SED  11/10/2020   TRACHELECTOMY     in place    No Known Allergies  Prior to Admission medications   Medication Sig Start Date End Date Taking? Authorizing Provider  allopurinol (ZYLOPRIM) 300 MG tablet Take 300 mg by mouth daily.   Yes [provider]  amLODipine (NORVASC) 10 MG tablet Take 10 mg by mouth daily.   Yes [provider]  calcitRIOL (ROCALTROL) 0.25 MCG capsule Take 0.25 mcg by mouth daily.   Yes [provider]  Cholecalciferol (VITAMIN D) 50 MCG (2000 UT) tablet Take 2,000 Units by mouth daily.   Yes [provider]  cloNIDine  (CATAPRES ) 0.3 MG tablet Take 0.3 mg by mouth 3 (three) times daily.   Yes [provider]  ferrous sulfate 325 (65 FE) MG tablet Take 325 mg by mouth daily.    Yes [provider]  hydrALAZINE (APRESOLINE) 100 MG tablet Take 100 mg by mouth 3 (three) times daily.   Yes [provider]  losartan (COZAAR) 25 MG tablet Take 25 mg by mouth daily.   Yes [provider]  metoprolol  tartrate (LOPRESSOR ) 25 MG tablet Place 1 tablet (25 mg total) into feeding tube 2 (two) times daily. Patient taking differently: Take 25 mg by mouth 2 (two) times daily. 11/12/20  Yes Jack Cox, Jack L, MD  torsemide (DEMADEX) 20 MG tablet Take 20 mg by mouth 2 (two) times daily.   Yes [provider]  apixaban  (ELIQUIS ) 5 MG TABS tablet Place 1 tablet (5 mg total) into feeding tube 2 (two) times daily. Patient taking differently: Take 5 mg by mouth 2 (two) times daily. 11/12/20   Jack Cox, Jack L, MD  pantoprazole  (PROTONIX ) 40 MG tablet Take 40 mg by mouth daily.    [provider]  promethazine (PHENERGAN) 12.5 MG tablet Take 12.5 mg by mouth every 8 (eight) hours as needed for vomiting or nausea. 08/15/23   [provider]  sodium zirconium cyclosilicate (LOKELMA) 10 g PACK packet Take 10 g by mouth every Monday, Wednesday, and Friday.    [provider]  traMADol  (ULTRAM ) 50 MG tablet Take 1 tablet (50 mg total) by mouth every  6 (six) hours as needed for moderate pain (pain score 4-6). 09/04/23   Jack Finical, PA-C    Social History   Socioeconomic History   Marital status: Divorced    Spouse name: Not on file   Number of children: Not on file   Years of education: Not on file   Highest education level: Not on file  Occupational History   Not on file  Tobacco Use   Smoking status: Never   Smokeless tobacco: Never  Vaping Use   Vaping status: Never Used  Substance and Sexual Activity   Alcohol use: Not Currently    Comment: once in a while   Drug use: No   Sexual activity: Yes    Birth control/protection: None  Other Topics Concern   Not on file  Social History Narrative   Not on file    Social Drivers of Health   Financial Resource Strain: Patient Declined (08/18/2023)   Received from [facility]   Overall Financial Resource Strain (CARDIA)    Difficulty of Paying Living Expenses: Patient declined  Food Insecurity: No Food Insecurity (08/18/2023)   Received from [facility]   Hunger Vital Sign    Worried About Running Out of Food in the Last Year: Never true    Ran Out of Food in the Last Year: Never true  Transportation Needs: No Transportation Needs (08/18/2023)   Received from [facility]   PRAPARE - Transportation    Lack of Transportation (Medical): No    Lack of Transportation (Non-Medical): No  Physical Activity: Inactive (08/18/2023)   Received from [facility]   Exercise Vital Sign    Days of Exercise per Week: 0 days    Minutes of Exercise per Session: 0 min  Stress: No Stress Concern Present (08/18/2023)   Received from [facility] - Occupational Stress Questionnaire    Feeling of Stress : Not at all  Social Connections: Patient Declined (08/18/2023)   Received from [facility]   Social Connection and Isolation Panel [NHANES]    Frequency of Communication with Friends and Family: Patient declined    Frequency of Social Gatherings with Friends and Family: Patient declined    Attends Religious Services: Patient declined    Database administrator or Organizations: Patient declined    Attends Banker Meetings: Patient declined    Marital Status: Patient declined  Intimate Partner Violence: Patient Declined (08/18/2023)   Received from [facility]   Humiliation, Afraid, Rape, and Kick questionnaire    Fear of Current or Ex-Partner: Patient declined    Emotionally Abused: Patient declined    Physically Abused: Patient declined    Sexually Abused: Patient declined    Family History  Problem Relation Age of Onset   Stomach cancer Mother    Colon cancer Father      ROS: Otherwise negative unless mentioned in HPI  Physical Examination  Vitals:   03/17/24 1000 03/17/24 1008  BP: (!) 158/80 (!) 159/87  Pulse: 68 65  Resp:    Temp:    SpO2: 98% 99%   Body mass index is 38.69 kg/m.  General: no acute distress Cardiac: hemodynamically stable Pulm: normal work of breathing Abdomen: non-tender, no pulsatile mass  Neuro: alert, no focal deficit   Data:   Findings:  +--------------------+----------+-----------------+--------+  AVF                PSV (cm/s)Flow  Vol (mL/min)Comments  +--------------------+----------+-----------------+--------+  Native artery inflow   243          1047                 +--------------------+----------+-----------------+--------+  AVF Anastomosis        198                               +--------------------+----------+-----------------+--------+    +------------+----------+-------------+----------+-------------------------  ----+  OUTFLOW VEINPSV (cm/s)Diameter (cm)Depth (cm)          Describe              +------------+----------+-------------+----------+-------------------------  ----+  Shoulder      117        0.65        0.70                                   +------------+----------+-------------+----------+-------------------------  ----+  Prox UA        143        0.60        0.64                                   +------------+----------+-------------+----------+-------------------------  ----+  Mid UA         476        0.77        0.30          Retained valve?          +------------+----------+-------------+----------+-------------------------  ----+  Dist UA        665        0.70        0.38    Retained valve and  elevated                                                 velocites 187 cm/s-665  cm/s                                                        (Ratio: 3.6)             +------------+----------+-------------+----------+-------------------------  ----+  AC Fossa       293        1.00        0.16                                   +------------+----------+-------------+----------+-------------------------  ----+  Summary:  Left:  - Flow Volume: 1047 mL/min.  - Elevated velocities and a velocity ratio of greater than 3 noted in the  distal  upper arm with visible narrowing/suspicious valve.     ASSESSMENT/PLAN: This is a 58 y.o. male with ESRD on HD via a left upper extremity brachiocephalic fistula. He has been having issues with dialysis sessions and a duplex demonstrated an outflow vein stenosis.    After discussing the risks and benefits of LUE  fistulagram, Jack Cox elected to proceed.    Jack Brawn MD [facility] [phone] 03/26/2024  9:03 AM

## 2024-03-17 NOTE — Op Note (Signed)
    Patient name: Jack Cox MRN: [id number] DOB: Apr 30, 1966 Sex: male  03/17/2024 Pre-operative Diagnosis: ESRD on HD Post-operative diagnosis:  Same Surgeon:  Philipp Brawn, MD Procedure Performed:  Ultrasound-guided access of left arm aVF Fistulogram and central venogram Balloon angioplasty of outflow vein stenosis, 8 x 60 mm lutonix   Indications: [name] is a 58 year old male with ESRD on HD via a left upper extremity brachiocephalic fistula.  He has been having issues with dialysis sessions and a duplex demonstrated an outflow vein stenosis.  Recent benefits of fistulogram with intervention were reviewed, he expressed understanding and elected to proceed.  Findings:  Widely patent central venous system.  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  Widely patent fistula in the mid arm and widely patent anastomosis.   Procedure:  The patient was identified in the holding area and taken to the cath lab  The patient was then placed supine on the table and prepped and draped in the usual sterile fashion.  A time out was called.  Ultrasound was used to evaluate the left arm AV access. This was accessed under u/s guidance. An 018 wire was advanced without resistance, a micropuncture sheath was placed and fistulagram obtained which demonstrated the above findings.  This access was then upsized to a 6  F short sheath over a Bentson wire.  The lesion was crossed and treated with an 8 mm x 60 mm lutonix balloon.  Completion angiography demonstrated an excellent result with less than 20% residual stenosis.  A figure-of-eight suture was placed around the access site with Monocryl and the sheath was removed.   Philipp Brawn MD [facility] [place] Office: [phone]

## 2024-03-17 NOTE — Progress Notes (Signed)
 Per Dr. Susi Eric patient can restart eliquis  tomorrow Tuesday 03/18/24.

## 2024-04-05 DIAGNOSIS — Z992 Dependence on renal dialysis: Secondary | ICD-10-CM | POA: Diagnosis not present

## 2024-04-05 DIAGNOSIS — N186 End stage renal disease: Secondary | ICD-10-CM | POA: Diagnosis not present

## 2024-05-05 DIAGNOSIS — N186 End stage renal disease: Secondary | ICD-10-CM | POA: Diagnosis not present

## 2024-05-05 DIAGNOSIS — Z992 Dependence on renal dialysis: Secondary | ICD-10-CM | POA: Diagnosis not present

## 2024-05-12 ENCOUNTER — Telehealth (INDEPENDENT_AMBULATORY_CARE_PROVIDER_SITE_OTHER): Payer: Self-pay

## 2024-05-12 NOTE — Telephone Encounter (Signed)
 Joy at [facility] sent in a order for the patient to have a right arm declot. Patient was scheduled with Dr. Marea for 05/13/24 at the [facility]. Joy called back to cancel the appt at the patient refused to come to [facility] and will wait until his appt at [facility] on Wednesday.

## 2024-05-13 ENCOUNTER — Encounter: Admission: RE | Payer: Self-pay | Source: Home / Self Care

## 2024-05-13 ENCOUNTER — Encounter (HOSPITAL_COMMUNITY): Payer: Self-pay

## 2024-05-13 ENCOUNTER — Ambulatory Visit: Admission: RE | Admit: 2024-05-13 | Source: Home / Self Care | Admitting: Vascular Surgery

## 2024-05-13 SURGERY — PERIPHERAL VASCULAR THROMBECTOMY
Anesthesia: Moderate Sedation | Laterality: Left

## 2024-05-14 ENCOUNTER — Encounter (HOSPITAL_COMMUNITY)
Admission: RE | Disposition: A | Discharge status: Home / Self Care | Payer: Self-pay | Source: Home / Self Care | Attending: Vascular Surgery

## 2024-05-14 ENCOUNTER — Other Ambulatory Visit: Payer: Self-pay

## 2024-05-14 ENCOUNTER — Ambulatory Visit (HOSPITAL_COMMUNITY)
Admission: RE | Admit: 2024-05-14 | Discharge: 2024-05-14 | Disposition: A | Discharge status: Home / Self Care | Attending: Vascular Surgery | Admitting: Vascular Surgery

## 2024-05-14 DIAGNOSIS — Y832 Surgical operation with anastomosis, bypass or graft as the cause of abnormal reaction of the patient, or of later complication, without mention of misadventure at the time of the procedure: Secondary | ICD-10-CM | POA: Insufficient documentation

## 2024-05-14 DIAGNOSIS — T82898A Other specified complication of vascular prosthetic devices, implants and grafts, initial encounter: Secondary | ICD-10-CM | POA: Diagnosis not present

## 2024-05-14 DIAGNOSIS — I509 Heart failure, unspecified: Secondary | ICD-10-CM | POA: Insufficient documentation

## 2024-05-14 DIAGNOSIS — I132 Hypertensive heart and chronic kidney disease with heart failure and with stage 5 chronic kidney disease, or end stage renal disease: Secondary | ICD-10-CM | POA: Diagnosis not present

## 2024-05-14 DIAGNOSIS — Z992 Dependence on renal dialysis: Secondary | ICD-10-CM | POA: Diagnosis not present

## 2024-05-14 DIAGNOSIS — N186 End stage renal disease: Secondary | ICD-10-CM

## 2024-05-14 DIAGNOSIS — T82868A Thrombosis of vascular prosthetic devices, implants and grafts, initial encounter: Secondary | ICD-10-CM | POA: Diagnosis not present

## 2024-05-14 DIAGNOSIS — T82858A Stenosis of vascular prosthetic devices, implants and grafts, initial encounter: Secondary | ICD-10-CM

## 2024-05-14 HISTORY — PX: VENOUS ANGIOPLASTY: CATH118376

## 2024-05-14 HISTORY — PX: DIALYSIS/PERMA CATHETER INSERTION: CATH118288

## 2024-05-14 HISTORY — PX: A/V SHUNT INTERVENTION: CATH118220

## 2024-05-14 SURGERY — A/V SHUNT INTERVENTION
Anesthesia: LOCAL

## 2024-05-14 MED ORDER — LIDOCAINE HCL (PF) 1 % IJ SOLN
INTRAMUSCULAR | Status: DC | PRN
Start: 2024-05-14 — End: 2024-05-14
  Administered 2024-05-14: 15 mL via INTRADERMAL
  Administered 2024-05-14: 2 mL via INTRADERMAL

## 2024-05-14 MED ORDER — MIDAZOLAM HCL 2 MG/2ML IJ SOLN
INTRAMUSCULAR | Status: AC
  Filled 2024-05-14: qty 2

## 2024-05-14 MED ORDER — LIDOCAINE HCL (PF) 1 % IJ SOLN
INTRAMUSCULAR | Status: AC
  Filled 2024-05-14: qty 30

## 2024-05-14 MED ORDER — HEPARIN SODIUM (PORCINE) 1000 UNIT/ML IJ SOLN
INTRAMUSCULAR | Status: AC
  Filled 2024-05-14: qty 10

## 2024-05-14 MED ORDER — HEPARIN SODIUM (PORCINE) 1000 UNIT/ML IJ SOLN
INTRAMUSCULAR | Status: DC | PRN
  Administered 2024-05-14: 3200 [IU] via INTRAVENOUS

## 2024-05-14 MED ORDER — IODIXANOL 320 MG/ML IV SOLN
INTRAVENOUS | Status: DC | PRN
  Administered 2024-05-14: 15 mL

## 2024-05-14 MED ORDER — FENTANYL CITRATE (PF) 100 MCG/2ML IJ SOLN
INTRAMUSCULAR | Status: DC | PRN
  Administered 2024-05-14: 50 ug via INTRAVENOUS

## 2024-05-14 MED ORDER — FENTANYL CITRATE (PF) 100 MCG/2ML IJ SOLN
INTRAMUSCULAR | Status: AC
  Filled 2024-05-14: qty 2

## 2024-05-14 MED ORDER — HEPARIN (PORCINE) IN NACL 1000-0.9 UT/500ML-% IV SOLN
INTRAVENOUS | Status: DC | PRN
  Administered 2024-05-14: 500 mL

## 2024-05-14 MED ORDER — MIDAZOLAM HCL 2 MG/2ML IJ SOLN
INTRAMUSCULAR | Status: DC | PRN
  Administered 2024-05-14: 1 mg via INTRAVENOUS

## 2024-05-14 SURGICAL SUPPLY — 14 items
BALLOON MUSTANG 6X80X75 (BALLOONS) ×1 IMPLANT
CATH BEACON 5 .035 65 KMP TIP (CATHETERS) ×1 IMPLANT
CATH PALINDROME-P 19CM W/VT (CATHETERS) ×1 IMPLANT
DEVICE TORQUE SEADRAGON GRN (MISCELLANEOUS) ×1 IMPLANT
GLIDEWIRE ADV .035X180CM (WIRE) ×1 IMPLANT
GUIDEWIRE ANGLED .035X150CM (WIRE) ×1 IMPLANT
KIT ENCORE 26 ADVANTAGE (KITS) ×1 IMPLANT
KIT MICROPUNCTURE NIT STIFF (SHEATH) ×1 IMPLANT
KIT PV (KITS) ×1 IMPLANT
SHEATH PINNACLE R/O II 7F 4CM (SHEATH) ×1 IMPLANT
SHEATH PROBE COVER 6X72 (BAG) ×1 IMPLANT
TRAY PV CATH (CUSTOM PROCEDURE TRAY) ×1 IMPLANT
TUBING CIL FLEX 10 FLL-RA (TUBING) ×1 IMPLANT
WIRE TORQFLEX AUST .018X40CM (WIRE) ×1 IMPLANT

## 2024-05-14 NOTE — Op Note (Signed)
 Patient name: Jack Cox MRN: [id number] DOB: 01/28/1966 Sex: male  05/14/2024 Pre-operative Diagnosis: ESRD on HD Post-operative diagnosis:  Same Surgeon:  Norman GORMAN Serve, MD Procedure Performed:  Ultrasound-guided access of the left arm aVF Fistulogram and central venogram Balloon angioplasty of cephalic arch and entire AVF with 6 mm Mustang balloon Ultrasound and fluoroscopic guided tunneled dialysis catheter placement 23 minutes of moderate sedation with fentanyl  and Versed   Indications: Jack Cox is a 58 year old male with ESRD on HD who presented to the HD access center today for intervention.  He reports he was having issues with clotting and was unable to run on his last session on Saturday.  Risks and benefits of fistulogram, possible thrombectomy, possible tunneled dialysis catheter insertion were reviewed and he elected to proceed.  Findings:  Widely patent central venous system.  The cephalic arch appeared to have severe stenosis in the entire AV fistula was sclerotic and essentially occluded.  The proximal portion of the AVF was patent with a palpable pulse although there was multiple areas of severe stenosis proximally as well.  After attempt of balloon angioplasty a fistulogram demonstrated a very sclerotic fistula with chronic appearing thrombus throughout the lumen.  For this reason I did not believe this fistula to be salvageable and proceeded with right IJ tunneled dialysis catheter insertion.  Widely patent compressible right internal jugular vein Successful placement of the tunneled dialysis catheter at the atriocaval junction.   Procedure:  The patient was identified in the holding area and taken to the cath lab  The patient was then placed supine on the table and prepped and draped in the usual sterile fashion.  A time out was called.  Ultrasound was used to evaluate the left arm AV access. This was accessed under u/s guidance. An 018 wire was advanced  without resistance, a micropuncture sheath was placed and fistulagram obtained which demonstrated the above findings.  This access was then upsized to a 10F short sheath over a glidewire.  Using the Glidewire and a KMP catheter I was able to navigate across the severely sclerotic/occluded aVF.  The catheter placed into the central system demonstrating a widely patent subclavian, innominate and SVC.  The wire was replaced into the central venous system and I attempted balloon angioplasty from the cephalic arch down through the entire fistula with a 6 mm balloon.  A fistulogram then demonstrated severely sclerotic fistula with what appeared to be chronic thrombus throughout the lumen and I do not believe this fistula to be salvageable.  The KMP catheter was placed over the wire into the central venous system and 50 mcg of fentanyl  and 1 mcg Versed  were administered directly into the central venous system.  The catheter and sheath were removed and a 4 Monocryl figure-of-eight suture was placed to manage the access.  The right neck was then prepped and draped in usual sterile fashion with plan to proceed with catheter placement.  Using ultrasound guidance the right internal jugular vein was accessed with micropuncture technique.  Through the micropuncture sheath, the guidewire was advanced into the superior vena cava.  A small incision was made around the skin access point.  The access point was serially dilated under direct fluoroscopic guidance.  A peel-away sheath was introduced into the superior vena cava under fluoroscopic guidance.  A counterincision was made in the chest under the clavicle.  A 19 cm tunnel dialysis catheter was then tunneled under the skin, over the clavicle into the incision in the  neck.  The tunneling device was removed and the catheter fed through the peel-away sheath into the superior vena cava.  The peel-away sheath was removed and the catheter gently pulled back.  Adequate position was  confirmed with x-ray.  The catheter was tested and found to aspirate and flush with ease.    The catheter was sutured to the skin and the neck incision was closed with 4-0 Monocryl.  The catheter was then capped and heparin  locked.   Contrast: 15 cc Sedation: 23 minutes  Impression: Nonsalvageable left arm AV fistula Successful placement of right IJ tunneled dialysis catheter  Patient will be scheduled for left arm AVG creation in the coming weeks with the next available surgeon.   Norman GORMAN Serve MD [facility] [place] Office: [phone]

## 2024-05-14 NOTE — H&P (Signed)
  HD ACCESS CENTER H&P   Patient ID: Jack Cox, male   DOB: December 12, 1965, 58 y.o.   MRN: [id number]  Subjective:     HPI Jack Cox is a 58 y.o. male with ESRD presenting to the HD access center for intervention.  Past Medical History:  Diagnosis Date   Anemia    CKD (chronic kidney disease)    Congestive heart failure (CHF) (HCC)    Dysrhythmia    a-fib   Essential hypertension    Fatigue    GERD (gastroesophageal reflux disease)    Gout    Hypercholesteremia    Insomnia    Pneumonia    Proteinuria    Renal insufficiency    Shortness of breath    Sleep apnea    use biPAP but currently on oxygen  2L-4L via [place]   Tracheostomy dependence (HCC)    Family History  Problem Relation Age of Onset   Stomach cancer Mother    Colon cancer Father    Past Surgical History:  Procedure Laterality Date   A/V FISTULAGRAM N/A 03/17/2024   Procedure: A/V Fistulagram;  Surgeon: Pearline Norman RAMAN, MD;  Location: [facility] INVASIVE CV LAB;  Service: Cardiovascular;  Laterality: N/A;   AV FISTULA PLACEMENT Left 09/04/2023   Procedure: LEFT ARM Brachial Cephalic ARTERIOVENOUS (AV) FISTULA CREATION;  Surgeon: Lanis Fonda FORBES, MD;  Location: [facility] OR;  Service: Vascular;  Laterality: Left;   IR GASTROSTOMY TUBE MOD SED  11/10/2020   TRACHELECTOMY     in place    Short Social History:  Social History   Tobacco Use   Smoking status: Never   Smokeless tobacco: Never  Substance Use Topics   Alcohol use: Not Currently    Comment: once in a while    No Known Allergies  No current facility-administered medications for this encounter.    REVIEW OF SYSTEMS All other systems were reviewed and are negative     Objective:   Objective   Vitals:   05/14/24 0844 05/14/24 0856  BP: (!) 142/92 (!) 142/92  Pulse: (!) 57 (!) 56  Resp: 12 12  Temp: 97.6 F (36.4 C)   TempSrc: Oral   SpO2: 97% 97%   There is no height or weight on file to calculate BMI.  Physical Exam General:  no acute distress Cardiac: hemodynamically stable Extremities: Palpable pulse in left arm brachiocephalic fistula  Data: Fistulogram from 03/17/2024 reviewed, 8 mm x 60 mm balloon angioplasty of outflow vein stenosis     Assessment/Plan:   Jack Cox is a 58 y.o. male with ESRD presenting for intervention.  Having issues with clotting and unable to run at his last session on Saturday. Reviewed risks and benefits of fistulogram, possible thrombectomy, possible tunneled dialysis catheter and patient agreed to proceed.   Norman Pearline, MD [facility]

## 2024-05-15 ENCOUNTER — Encounter (HOSPITAL_COMMUNITY): Payer: Self-pay | Admitting: Vascular Surgery

## 2024-06-05 DIAGNOSIS — Z992 Dependence on renal dialysis: Secondary | ICD-10-CM | POA: Diagnosis not present

## 2024-06-05 DIAGNOSIS — N186 End stage renal disease: Secondary | ICD-10-CM | POA: Diagnosis not present

## 2024-06-16 DIAGNOSIS — E7800 Pure hypercholesterolemia, unspecified: Secondary | ICD-10-CM | POA: Diagnosis not present

## 2024-06-16 DIAGNOSIS — Z7189 Other specified counseling: Secondary | ICD-10-CM | POA: Diagnosis not present

## 2024-06-16 DIAGNOSIS — I10 Essential (primary) hypertension: Secondary | ICD-10-CM | POA: Diagnosis not present

## 2024-06-16 DIAGNOSIS — R5383 Other fatigue: Secondary | ICD-10-CM | POA: Diagnosis not present

## 2024-06-16 DIAGNOSIS — Z299 Encounter for prophylactic measures, unspecified: Secondary | ICD-10-CM | POA: Diagnosis not present

## 2024-06-16 DIAGNOSIS — Z1389 Encounter for screening for other disorder: Secondary | ICD-10-CM | POA: Diagnosis not present

## 2024-06-16 DIAGNOSIS — Z0000 Encounter for general adult medical examination without abnormal findings: Secondary | ICD-10-CM | POA: Diagnosis not present

## 2024-06-16 DIAGNOSIS — Z79899 Other long term (current) drug therapy: Secondary | ICD-10-CM | POA: Diagnosis not present

## 2024-06-17 DIAGNOSIS — E785 Hyperlipidemia, unspecified: Secondary | ICD-10-CM | POA: Diagnosis not present

## 2024-06-17 DIAGNOSIS — I10 Essential (primary) hypertension: Secondary | ICD-10-CM | POA: Diagnosis not present

## 2024-06-17 DIAGNOSIS — N184 Chronic kidney disease, stage 4 (severe): Secondary | ICD-10-CM | POA: Diagnosis not present

## 2024-06-17 DIAGNOSIS — I4892 Unspecified atrial flutter: Secondary | ICD-10-CM | POA: Diagnosis not present

## 2024-06-17 DIAGNOSIS — I5032 Chronic diastolic (congestive) heart failure: Secondary | ICD-10-CM | POA: Diagnosis not present

## 2024-06-27 ENCOUNTER — Other Ambulatory Visit: Payer: Self-pay

## 2024-06-27 ENCOUNTER — Encounter (HOSPITAL_COMMUNITY): Payer: Self-pay | Admitting: Vascular Surgery

## 2024-06-27 NOTE — Anesthesia Preprocedure Evaluation (Addendum)
 Anesthesia Evaluation  Patient identified by MRN, date of birth, ID band Patient awake    Reviewed: Allergy & Precautions, NPO status , Patient's Chart, lab work & pertinent test results, reviewed documented beta blocker date and time   History of Anesthesia Complications Negative for: history of anesthetic complications  Airway Mallampati: II  TM Distance: >3 FB     Dental no notable dental hx. (+) Poor Dentition   Pulmonary shortness of breath and with exertion, sleep apnea , neg COPD S/p trach   breath sounds clear to auscultation       Cardiovascular hypertension, +CHF  (-) CAD, (-) Past MI and (-) Cardiac Stents + dysrhythmias  Rhythm:Regular Rate:Normal  IMPRESSIONS     1. Left ventricular ejection fraction, by estimation, is 60 to 65%. The  left ventricle has normal function. The left ventricle has no regional  wall motion abnormalities. There is severe left ventricular hypertrophy.  Left ventricular diastolic parameters   are indeterminate. There is the interventricular septum is flattened in  systole and diastole, consistent with right ventricular pressure and  volume overload.   2. Right ventricular systolic function is mildly reduced. The right  ventricular size is mildly enlarged. Tricuspid regurgitation signal is  inadequate for assessing PA pressure.   3. Right atrial size was upper normal.   4. The pericardial effusion is posterior to the left ventricle.   5. The mitral valve is grossly normal. Trivial mitral valve  regurgitation.   6. The aortic valve is tricuspid. Aortic valve regurgitation is not  visualized.   7. The inferior vena cava is normal in size with greater than 50%  respiratory variability, suggesting right atrial pressure of 3 mmHg.      Neuro/Psych neg Seizures    GI/Hepatic ,GERD  ,,  Endo/Other    Renal/GU Dialysis and ESRFRenal disease     Musculoskeletal   Abdominal    Peds  Hematology  (+) Blood dyscrasia, anemia   Anesthesia Other Findings   Reproductive/Obstetrics                              Anesthesia Physical Anesthesia Plan  ASA: 3  Anesthesia Plan: General   Post-op Pain Management:    Induction: Intravenous  PONV Risk Score and Plan: 2 and Ondansetron  and Dexamethasone   Airway Management Planned: Tracheostomy  Additional Equipment:   Intra-op Plan:   Post-operative Plan: Extubation in OR  Informed Consent: I have reviewed the patients History and Physical, chart, labs and discussed the procedure including the risks, benefits and alternatives for the proposed anesthesia with the patient or authorized representative who has indicated his/her understanding and acceptance.     Dental advisory given  Plan Discussed with: CRNA  Anesthesia Plan Comments: (PAT note written 06/27/2024 by Allison Zelenak, PA-C.  )         Anesthesia Quick Evaluation

## 2024-06-27 NOTE — Progress Notes (Signed)
 PCP - Dr Eligio Fairly Cardiologist - [facility] Card[facility] in [facility] Nephrology - Dr Saralee Stank  Chest x-ray - n/a EKG - 09/04/23; 06/17/24 [facility] (Requested) Stress Test - n/a ECHO - 08/09/23 [facility] Cardiac Cath - n/a  ICD Pacemaker/Loop - n/a  Sleep Study -  Yes (04/21/22 [facility]) biPAP - use biPAP at night and oxygen  2L via  during the day    Diabetes - n/a  Blood Thinner Instructions:  Hold Eliquis  2 days prior to procedure.  Last dose was on 06/27/24.  Aspirin Instructions: n/a  NPO  Anesthesia review: Delon, RN sent to anesthesia on 06/26/24.  STOP now taking any Aspirin (unless otherwise instructed by your surgeon), Aleve, Naproxen, Ibuprofen, Motrin, Advil, Goody's, BC's, all herbal medications, fish oil, and all vitamins.   Coronavirus Screening Do you have any of the following symptoms:  Cough yes/no: No Fever (>100.89F)  yes/no: No Runny nose yes/no: No Sore throat yes/no: No Difficulty breathing/shortness of breath  yes/no: No  Have you traveled in the last 14 days and where? yes/no: No  Patient verbalized understanding of instructions that were given via phone.

## 2024-06-27 NOTE — Progress Notes (Signed)
 Anesthesia Chart Review: SAME DAY WORK-UP  Case: [id number] Date/Time: 06/30/24 0948   Procedure: INSERTION, GRAFT, ARTERIOVENOUS, UPPER EXTREMITY (Left)   Anesthesia type: Choice   Diagnosis: ESRD (end stage renal disease) (HCC) [N18.6]   Pre-op  diagnosis: ESRD   [facility] ROOM 16 / [facility] OR   Surgeons: Gretta Lonni PARAS, MD       DISCUSSION: Patient is a 58 year old male scheduled for the above procedure.  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    Last cardiology visit 06/17/24 with Dr. Gil at [facility] [place]. Seen for follow-up chronic HFpEF, PAF, HTN, HLD. Meds include amlodipine, clonidine , hydralazine, losartan, torsemide, pravastatin, metoprolol , apixaban . Continue current meds, but consider stopping torsemide if no long making urine once on HD a bit longer. Update cholesterol panel at next visit. He wrote, Preoperative CV examination Overall doing well with no s/sx of angina. No further workup required prior to planned procedures such as fistula revision.  TTE 08/09/2023 showed EF 55%, normal RV systolic function, no significant valvular abnormalities .Ambulatory ECG monitoring in 08/2023 showed predominant SR, first degree AVB, BBB/IVCD, rare PACs, 2008 episodes of SVT (fastest 14 beats at 179 bpm and longest 15.4 seconds), rare PVCs, no pauses >3 seconds or high-grade AV block detected, no afib.    He has severe OSA s/p tracheostomy in 2001. By notes, he was diagnosed with severe OSA in 1992, and had a urological procedure at [facility] and woke up with a tracheostomy which he has had ever since. As of 09/10/2024 trach change he had a 4UN65R trach. Per 04/16/2023 sleep medicine visit, He has tested AHI of 18.7 on spit night despite having  trach. He wears his trache plugged. He was titrated for Bipap. He returns for adherance follow up. Hr is doing well without concerns. Hr has residual AHI of 1.7 with no clinical leak. He has no concerns for need of pressure changes. He has usage average of 90% great than 4 hours per night with average use of 6 hours and 26 minutes. He will continue to use and benefit from pap therapy. He reportedly uses 4L supplemental O2 via [place] at night and 2L during the day.   VVS advised holding Eliquis  for 3 days prior to procedure.   Anesthesia team to evaluate on the day of surgery.    VS:  Wt Readings from Last 3 Encounters:  03/17/24 112 kg  11/08/23 121.9 kg  09/04/23 123.8 kg   BP Readings from Last 3 Encounters:  05/14/24 (!) 145/87  03/17/24 (!) 159/87  11/08/23 126/78   Pulse Readings from Last 3 Encounters:  05/14/24 (!) 57  03/17/24 65  11/08/23 71     PROVIDERS: Maree Isles, MD is PCP  Gil Franky Barter, MD is cardiologist [facility] @ [place]) Dennise Capri, MD is nephrologist Lue Katz, MD is ENT ([facility]), last visit 09/12/2023 for trach tube change. New [id number] was placed with obturator (trach supplied from clinic as patient did not bring one). Obturator was removed and trach was secured with ties. Inner canula was placed. Jamal was in good position with good airflow through trach tube. Flexible suction was passed easily. He has visit with Janetta Lukes, MD on 07/04/2024. Tiburcio Krabbe, NP is Sleep Clinic provider ([facility])   LABS: For day of surgery. Most recent results in  [facility] include: Lab Results  Component Value Date   WBC 5.9 08/19/2021   HGB 9.5 (L) 03/17/2024   HCT 28.0 (L) 03/17/2024   PLT 310 08/19/2021   GLUCOSE 97 03/17/2024   TRIG 345 (H) 10/25/2020   ALT 16 10/25/2020   AST 27 10/25/2020   NA 145 03/17/2024   K 4.6 03/17/2024   CL 110 03/17/2024   CREATININE 8.10 (H) 03/17/2024   BUN 61 (H) 03/17/2024   CO2 27 06/22/2021   INR  1.0 08/19/2021      Split Sleep Study 12/09/2022 ([facility]; as outlined in 01/08/2023 note): An optimal PAP pressure could not be selected for this patient based on the available study data. CPAP failed tocontrol obstructive sleep apnea (14 cwp was temporarily satisfactory, but with later events was changed toBiPAP and titrated to 17/11 where significant leak was encountered). Recommend autoBiPAP trial and therapy. Central sleep apnea was not noted during this titration (CAI = 1.3/h). Severe oxygen  desaturations were observed during this titration (min O2 = 73%). The patient snored with moderate snoring volume. No cardiac abnormalities were observed during this study. Clinically significant periodic limb movements were not noted during this study (PLMI = 4.0/h). Arousals associated with PLMs were rare.    IMAGES: US  Renal 06/02/2024 [facility]): Impression: No hydronephrosis. Evidence of medical renal disease as above.    EKG: EKG 06/17/2024 [facility]): Per cardiology office note: Sinus bradycardia, first-degree AV block, left axis deviation, right bundle branch block (06/17/2024)  EKG 09/04/2023: Sinus rhythm with 1st degree A-V block Left axis deviation Right bundle branch block Abnormal ECG When compared with ECG of 23-Oct-2020 21:20, now in sinus rhythm Confirmed by Lonni Slain [phone]) on 09/04/2023 10:51:25 PM     CV: Ambulatory ECG 08/20/2023 - 09/03/2023 ([facility]): Conclusions:  - Ambulatory ECG monitoring was performed from 08/20/23 to 09/03/23, with  ~13.5 days of analyzable data.  -  The predominant rhythm was sinus rhythm, with sinus rate ranging from  54 to 111 and averaging 73 bpm.  -  First Degree AV Block was present. Bundle Branch Block/IVCD was  present.  -  Rare PACs were recorded.  -  2008 episodes of asymptomatic SVT occurred, the run with the fastest  interval lasting 14 beats with a max rate of 179 bpm, the longest lasting  15.4 secs with an avg rate  of 103 bpm.  -  Rare PVCs were recorded with no recorded episodes of wide complex  tachycardia.  - There were no diary entries or patient triggered events.  -  No pauses >3 seconds or high-grade AV block detected.  -  No atrial fibrillation detected.    TTE 08/09/2023 [facility]): Summary   1. The left ventricle is normal in size with moderately increased wall  thickness.   2. The left ventricular systolic function is normal, LVEF is visually  estimated at 55%.    3. The right ventricle is upper normal in size, with normal systolic  function.    Past Medical History:  Diagnosis Date   Anemia    CKD (chronic kidney disease)    Congestive heart failure (CHF) (HCC)    Dysrhythmia    a-fib   Essential hypertension    Fatigue    GERD (gastroesophageal reflux disease)    Gout    Hypercholesteremia    Insomnia    Pneumonia    Proteinuria    Renal insufficiency    Shortness of breath    Sleep apnea  use biPAP but currently on oxygen  2L-4L via [place]   Tracheostomy dependence [facility])     Past Surgical History:  Procedure Laterality Date   A/V FISTULAGRAM N/A 03/17/2024   Procedure: A/V Fistulagram;  Surgeon: Pearline Norman RAMAN, MD;  Location: [facility] INVASIVE CV LAB;  Service: Cardiovascular;  Laterality: N/A;   A/V SHUNT INTERVENTION N/A 05/14/2024   Procedure: A/V SHUNT INTERVENTION;  Surgeon: Pearline Norman RAMAN, MD;  Location: [facility] PV LAB;  Service: Cardiovascular;  Laterality: N/A;   AV FISTULA PLACEMENT Left 09/04/2023   Procedure: LEFT ARM Brachial Cephalic ARTERIOVENOUS (AV) FISTULA CREATION;  Surgeon: Lanis Fonda BRAVO, MD;  Location: [facility] OR;  Service: Vascular;  Laterality: Left;   DIALYSIS/PERMA CATHETER INSERTION N/A 05/14/2024   Procedure: DIALYSIS/PERMA CATHETER INSERTION;  Surgeon: Pearline Norman RAMAN, MD;  Location: [facility];  Service: Cardiovascular;  Laterality: N/A;   IR GASTROSTOMY TUBE MOD SED  11/10/2020   TRACHELECTOMY     in place   VENOUS ANGIOPLASTY  05/14/2024   Procedure:  VENOUS ANGIOPLASTY;  Surgeon: Pearline Norman RAMAN, MD;  Location: [facility] LAB;  Service: Cardiovascular;;  cephalic arch    MEDICATIONS: No current facility-administered medications for this encounter.    allopurinol (ZYLOPRIM) 300 MG tablet   amLODipine (NORVASC) 10 MG tablet   apixaban  (ELIQUIS ) 5 MG TABS tablet   calcitRIOL (ROCALTROL) 0.25 MCG capsule   Cholecalciferol (VITAMIN D) 50 MCG (2000 UT) tablet   cloNIDine  (CATAPRES ) 0.3 MG tablet   ferrous sulfate 325 (65 FE) MG tablet   hydrALAZINE (APRESOLINE) 100 MG tablet   losartan (COZAAR) 25 MG tablet   metoprolol  tartrate (LOPRESSOR ) 25 MG tablet   pantoprazole  (PROTONIX ) 40 MG tablet   promethazine (PHENERGAN) 12.5 MG tablet   sodium zirconium cyclosilicate (LOKELMA) 10 g PACK packet   torsemide (DEMADEX) 20 MG tablet   traMADol  (ULTRAM ) 50 MG tablet    Isaiah Ruder, PA-C Surgical Short Stay/Anesthesiology [facility] Phone [phone] [facility] Phone [phone] 06/27/2024 10:29 AM

## 2024-06-30 ENCOUNTER — Ambulatory Visit (HOSPITAL_COMMUNITY)
Admission: RE | Admit: 2024-06-30 | Discharge: 2024-06-30 | Disposition: A | Discharge status: Home / Self Care | Attending: Vascular Surgery | Admitting: Vascular Surgery

## 2024-06-30 ENCOUNTER — Ambulatory Visit (HOSPITAL_COMMUNITY): Payer: Self-pay | Admitting: Vascular Surgery

## 2024-06-30 ENCOUNTER — Encounter (HOSPITAL_COMMUNITY)
Admission: RE | Disposition: A | Discharge status: Home / Self Care | Payer: Self-pay | Source: Home / Self Care | Attending: Vascular Surgery

## 2024-06-30 ENCOUNTER — Encounter (HOSPITAL_COMMUNITY): Payer: Self-pay | Admitting: Vascular Surgery

## 2024-06-30 ENCOUNTER — Other Ambulatory Visit: Payer: Self-pay

## 2024-06-30 DIAGNOSIS — K219 Gastro-esophageal reflux disease without esophagitis: Secondary | ICD-10-CM | POA: Diagnosis not present

## 2024-06-30 DIAGNOSIS — I132 Hypertensive heart and chronic kidney disease with heart failure and with stage 5 chronic kidney disease, or end stage renal disease: Secondary | ICD-10-CM

## 2024-06-30 DIAGNOSIS — G4730 Sleep apnea, unspecified: Secondary | ICD-10-CM | POA: Diagnosis not present

## 2024-06-30 DIAGNOSIS — D631 Anemia in chronic kidney disease: Secondary | ICD-10-CM | POA: Insufficient documentation

## 2024-06-30 DIAGNOSIS — I5033 Acute on chronic diastolic (congestive) heart failure: Secondary | ICD-10-CM | POA: Diagnosis not present

## 2024-06-30 DIAGNOSIS — I3139 Other pericardial effusion (noninflammatory): Secondary | ICD-10-CM | POA: Diagnosis not present

## 2024-06-30 DIAGNOSIS — T82898A Other specified complication of vascular prosthetic devices, implants and grafts, initial encounter: Secondary | ICD-10-CM | POA: Insufficient documentation

## 2024-06-30 DIAGNOSIS — I509 Heart failure, unspecified: Secondary | ICD-10-CM | POA: Diagnosis not present

## 2024-06-30 DIAGNOSIS — R0602 Shortness of breath: Secondary | ICD-10-CM | POA: Insufficient documentation

## 2024-06-30 DIAGNOSIS — N186 End stage renal disease: Secondary | ICD-10-CM | POA: Diagnosis not present

## 2024-06-30 DIAGNOSIS — Z992 Dependence on renal dialysis: Secondary | ICD-10-CM

## 2024-06-30 DIAGNOSIS — N184 Chronic kidney disease, stage 4 (severe): Secondary | ICD-10-CM

## 2024-06-30 DIAGNOSIS — X58XXXA Exposure to other specified factors, initial encounter: Secondary | ICD-10-CM | POA: Diagnosis not present

## 2024-06-30 HISTORY — PX: INSERTION OF ARTERIOVENOUS (AV) ARTEGRAFT ARM: SHX6779

## 2024-06-30 LAB — POCT I-STAT, CHEM 8
BUN: 63 mg/dL — ABNORMAL HIGH (ref 6–20)
Calcium, Ion: 1.07 mmol/L — ABNORMAL LOW (ref 1.15–1.40)
Chloride: 111 mmol/L (ref 98–111)
Creatinine, Ser: 9.3 mg/dL — ABNORMAL HIGH (ref 0.61–1.24)
Glucose, Bld: 94 mg/dL (ref 70–99)
HCT: 37 % — ABNORMAL LOW (ref 39.0–52.0)
Hemoglobin: 12.6 g/dL — ABNORMAL LOW (ref 13.0–17.0)
Potassium: 4.5 mmol/L (ref 3.5–5.1)
Sodium: 144 mmol/L (ref 135–145)
TCO2: 22 mmol/L (ref 22–32)

## 2024-06-30 LAB — GLUCOSE, CAPILLARY: Glucose-Capillary: 88 mg/dL (ref 70–99)

## 2024-06-30 SURGERY — INSERTION, GRAFT, ARTERIOVENOUS, UPPER EXTREMITY
Anesthesia: General | Site: Arm Upper | Laterality: Left

## 2024-06-30 MED ORDER — DEXAMETHASONE SODIUM PHOSPHATE 10 MG/ML IJ SOLN
INTRAMUSCULAR | Status: DC | PRN
  Administered 2024-06-30: 4 mg via INTRAVENOUS

## 2024-06-30 MED ORDER — OXYCODONE HCL 5 MG/5ML PO SOLN: 5.0000 mg | Freq: Once | ORAL | Status: DC | PRN

## 2024-06-30 MED ORDER — FENTANYL CITRATE (PF) 250 MCG/5ML IJ SOLN
INTRAMUSCULAR | Status: AC
  Filled 2024-06-30: qty 5

## 2024-06-30 MED ORDER — SUCCINYLCHOLINE CHLORIDE 200 MG/10ML IV SOSY
PREFILLED_SYRINGE | INTRAVENOUS | Status: AC
  Filled 2024-06-30: qty 10

## 2024-06-30 MED ORDER — HEPARIN 6000 UNIT IRRIGATION SOLUTION
Status: DC | PRN
  Administered 2024-06-30: 1

## 2024-06-30 MED ORDER — LIDOCAINE 2% (20 MG/ML) 5 ML SYRINGE
INTRAMUSCULAR | Status: AC
  Filled 2024-06-30: qty 5

## 2024-06-30 MED ORDER — CHLORHEXIDINE GLUCONATE 4 % EX SOLN: 60.0000 mL | Freq: Once | CUTANEOUS | Status: DC

## 2024-06-30 MED ORDER — LIDOCAINE 2% (20 MG/ML) 5 ML SYRINGE
INTRAMUSCULAR | Status: DC | PRN
  Administered 2024-06-30: 60 mg via INTRAVENOUS

## 2024-06-30 MED ORDER — FENTANYL CITRATE (PF) 100 MCG/2ML IJ SOLN
INTRAMUSCULAR | Status: AC
  Filled 2024-06-30: qty 2

## 2024-06-30 MED ORDER — ROCURONIUM BROMIDE 10 MG/ML (PF) SYRINGE
PREFILLED_SYRINGE | INTRAVENOUS | Status: AC
  Filled 2024-06-30: qty 10

## 2024-06-30 MED ORDER — PHENYLEPHRINE 80 MCG/ML (10ML) SYRINGE FOR IV PUSH (FOR BLOOD PRESSURE SUPPORT)
PREFILLED_SYRINGE | INTRAVENOUS | Status: AC
  Filled 2024-06-30: qty 10

## 2024-06-30 MED ORDER — PROPOFOL 10 MG/ML IV BOLUS
INTRAVENOUS | Status: AC
  Filled 2024-06-30: qty 20

## 2024-06-30 MED ORDER — HEPARIN SODIUM (PORCINE) 1000 UNIT/ML IJ SOLN
INTRAMUSCULAR | Status: AC
  Filled 2024-06-30: qty 10

## 2024-06-30 MED ORDER — 0.9 % SODIUM CHLORIDE (POUR BTL) OPTIME
TOPICAL | Status: DC | PRN
  Administered 2024-06-30: 1000 mL

## 2024-06-30 MED ORDER — MIDAZOLAM HCL 2 MG/2ML IJ SOLN
INTRAMUSCULAR | Status: AC
Start: 2024-06-30 — End: 2024-06-30
  Filled 2024-06-30: qty 2

## 2024-06-30 MED ORDER — ONDANSETRON HCL 4 MG/2ML IJ SOLN
INTRAMUSCULAR | Status: DC | PRN
  Administered 2024-06-30: 4 mg via INTRAVENOUS

## 2024-06-30 MED ORDER — CEFAZOLIN SODIUM-DEXTROSE 2-4 GM/100ML-% IV SOLN
2.0000 g | INTRAVENOUS | Status: AC
  Administered 2024-06-30: 2 g via INTRAVENOUS
  Filled 2024-06-30: qty 100

## 2024-06-30 MED ORDER — PROPOFOL 10 MG/ML IV BOLUS
INTRAVENOUS | Status: DC | PRN
  Administered 2024-06-30: 30 mg via INTRAVENOUS
  Administered 2024-06-30: 100 mg via INTRAVENOUS

## 2024-06-30 MED ORDER — FENTANYL CITRATE (PF) 250 MCG/5ML IJ SOLN
INTRAMUSCULAR | Status: DC | PRN
  Administered 2024-06-30: 25 ug via INTRAVENOUS
  Administered 2024-06-30: 100 ug via INTRAVENOUS

## 2024-06-30 MED ORDER — MIDAZOLAM HCL 2 MG/2ML IJ SOLN
INTRAMUSCULAR | Status: AC
  Filled 2024-06-30: qty 2

## 2024-06-30 MED ORDER — PHENYLEPHRINE HCL (PRESSORS) 10 MG/ML IV SOLN
INTRAVENOUS | Status: AC
  Filled 2024-06-30: qty 1

## 2024-06-30 MED ORDER — SODIUM CHLORIDE 0.9 % IV SOLN: INTRAVENOUS | Status: DC

## 2024-06-30 MED ORDER — HEPARIN 6000 UNIT IRRIGATION SOLUTION
Status: AC
  Filled 2024-06-30: qty 500

## 2024-06-30 MED ORDER — MIDAZOLAM HCL 2 MG/2ML IJ SOLN
INTRAMUSCULAR | Status: DC | PRN
  Administered 2024-06-30: .5 mg via INTRAVENOUS

## 2024-06-30 MED ORDER — ONDANSETRON HCL 4 MG/2ML IJ SOLN: 4.0000 mg | Freq: Once | INTRAMUSCULAR | Status: DC | PRN

## 2024-06-30 MED ORDER — CHLORHEXIDINE GLUCONATE 0.12 % MT SOLN
15.0000 mL | OROMUCOSAL | Status: AC
  Administered 2024-06-30: 15 mL via OROMUCOSAL
  Filled 2024-06-30 (×2): qty 15

## 2024-06-30 MED ORDER — SODIUM CHLORIDE 0.9% FLUSH: 3.0000 mL | INTRAVENOUS | Status: DC | PRN

## 2024-06-30 MED ORDER — FENTANYL CITRATE (PF) 100 MCG/2ML IJ SOLN: 25.0000 ug | INTRAMUSCULAR | Status: DC | PRN

## 2024-06-30 MED ORDER — TRAMADOL HCL 50 MG PO TABS: 50.0000 mg | ORAL_TABLET | Freq: Four times a day (QID) | ORAL | 0 refills | Status: DC | PRN

## 2024-06-30 MED ORDER — OXYCODONE HCL 5 MG PO TABS: 5.0000 mg | ORAL_TABLET | Freq: Once | ORAL | Status: DC | PRN

## 2024-06-30 MED ORDER — ACETAMINOPHEN 10 MG/ML IV SOLN: 1000.0000 mg | Freq: Once | INTRAVENOUS | Status: DC | PRN

## 2024-06-30 SURGICAL SUPPLY — 29 items
ARMBAND PINK RESTRICT EXTREMIT (MISCELLANEOUS) ×2 IMPLANT
BAG COUNTER SPONGE SURGICOUNT (BAG) ×1 IMPLANT
BLADE CLIPPER SURG (BLADE) ×1 IMPLANT
CANISTER SUCTION 3000ML PPV (SUCTIONS) ×1 IMPLANT
CLIP TI MEDIUM 6 (CLIP) ×1 IMPLANT
CLIP TI WIDE RED SMALL 6 (CLIP) ×1 IMPLANT
COVER PROBE W GEL 5X96 (DRAPES) ×1 IMPLANT
DERMABOND ADVANCED .7 DNX12 (GAUZE/BANDAGES/DRESSINGS) ×1 IMPLANT
ELECTRODE REM PT RTRN 9FT ADLT (ELECTROSURGICAL) ×1 IMPLANT
GLOVE BIO SURGEON STRL SZ7.5 (GLOVE) ×1 IMPLANT
GLOVE BIOGEL PI IND STRL 8 (GLOVE) ×1 IMPLANT
GOWN STRL REUS W/ TWL LRG LVL3 (GOWN DISPOSABLE) ×2 IMPLANT
GOWN STRL REUS W/ TWL XL LVL3 (GOWN DISPOSABLE) ×2 IMPLANT
KIT BASIN OR (CUSTOM PROCEDURE TRAY) ×1 IMPLANT
KIT TURNOVER KIT B (KITS) ×1 IMPLANT
NS IRRIG 1000ML POUR BTL (IV SOLUTION) ×1 IMPLANT
PACK CV ACCESS (CUSTOM PROCEDURE TRAY) ×1 IMPLANT
PAD ARMBOARD POSITIONER FOAM (MISCELLANEOUS) ×2 IMPLANT
SLING ARM FOAM STRAP LRG (SOFTGOODS) IMPLANT
SPIKE FLUID TRANSFER (MISCELLANEOUS) ×1 IMPLANT
SUT MNCRL AB 4-0 PS2 18 (SUTURE) ×1 IMPLANT
SUT PROLENE 5 0 C 1 24 (SUTURE) IMPLANT
SUT PROLENE 6 0 BV (SUTURE) ×1 IMPLANT
SUT SILK 2 0 PERMA HAND 18 BK (SUTURE) IMPLANT
SUT SILK 3-0 18XBRD TIE 12 (SUTURE) IMPLANT
SUT VIC AB 3-0 SH 27X BRD (SUTURE) ×2 IMPLANT
TOWEL GREEN STERILE (TOWEL DISPOSABLE) ×1 IMPLANT
UNDERPAD 30X36 HEAVY ABSORB (UNDERPADS AND DIAPERS) ×1 IMPLANT
WATER STERILE IRR 1000ML POUR (IV SOLUTION) ×1 IMPLANT

## 2024-06-30 NOTE — Anesthesia Procedure Notes (Signed)
 Procedure Name: Intubation Date/Time: 06/30/2024 11:53 AM  Performed by: Boyce Shilling, CRNAPre-anesthesia Checklist: Patient identified, Emergency Drugs available, Suction available, Timeout performed and Patient being monitored Patient Re-evaluated:Patient Re-evaluated prior to induction Oxygen  Delivery Method: Circle system utilized Preoxygenation: Pre-oxygenation with 100% oxygen  Induction Type: IV induction and Tracheostomy Ventilation: Mask ventilation without difficulty Tube type: Reinforced Tube size: 6.0 mm Number of attempts: 2 Placement Confirmation: ETT inserted through vocal cords under direct vision, positive ETCO2, CO2 detector and breath sounds checked- equal and bilateral Secured at: 12 cm Tube secured with: Tape Dental Injury: Teeth and Oropharynx as per pre-operative assessment  Comments: Tracheostomy in situ. Per Dr. Patrisha, tracheostomy removed and attempted to place 6.5 reinforced ETT in trach stoma without success. 6.0 reinforced ETT placed into trach stoma smoothly by Dr. Patrisha with +etco2 and adequate tidal volumes.

## 2024-06-30 NOTE — Op Note (Addendum)
    NAME: YOUSOF Cox    MRN: [id number] DOB: 07-Apr-1966    DATE OF OPERATION: 06/30/2024  PREOP DIAGNOSIS:    End-stage renal disease requiring dialysis  POSTOP DIAGNOSIS:    Same  PROCEDURE:    Left arm brachiobasilic fistula creation  SURGEON: Fonda FORBES Rim  ASSIST: Donnice Sender, PA  ANESTHESIA: General  EBL: 10 mL  INDICATIONS:    Jack Cox is a 58 y.o. male with end-stage renal disease well-known to my service having previously undergone left-sided brachiocephalic fistula creation.  This worked well for roughly 1 year prior to occlusion.  He presents today for new HD access.  FINDINGS:   3.5 mm basilic vein 3.5 mm brachial artery  TECHNIQUE:    The patient was brought to the operating room and placed in supine position. The left arm was prepped and draped in a standard fashion. IV antibiotics were prior to incision. A timeout was performed.   The basilic vein in the left arm was identified using ultrasound and appeared of sufficient size. A transverse incision was made above the elbow creese in the antecubital fossa. The basilic vein was identified and isolated for 4 cm in length.  The bicipital aponeurosis was partially released and the brachial artery freed from its paired brachial veins and secured with a vessel loop. The patient was heparinized. The basilic vein was marked and ligated distally with 2-0 silk, then flushed with heparinized saline. Vascular clamps were placed proximally and distally on the brachial artery and a 5 mm arteriotomy  was created on the brachial artery. This was flushed with heparin  saline. The vein was juxtaposed to the artery and an anastomosis was created using 6-0 Prolene.   Prior to completing the anastomsis, the vessels were flushed and the suture line was tied down. There was an excellent thrill in the basilic vein from the anastomosis into the upper arm. The patient had a 2+ radial pulse. The incision was irrigated  and hemostasis acheived. The deeper tissue was closed with 3-0 Vicryl and the skin closed with 4-0 Monocryl.   Given the complexity of the case,  the assistant was necessary in order to expedient the procedure and safely perform the technical aspects of the operation.  The assistant provided traction and countertraction to assist with exposure of the artery and vein.  They also assisted with suture ligation of multiple venous branches.  They played a critical role in the anastomosis. These skills, especially following the Prolene suture for the anastomosis, could not have been adequately performed by a scrub tech assistant.    Dermabond was applied the incisions. He was transferred to PACU in stable condition.     Fonda FORBES Rim, MD [facility] DATE OF DICTATION:   06/30/2024

## 2024-06-30 NOTE — Transfer of Care (Signed)
 Immediate Anesthesia Transfer of Care Note  Patient: Jack Cox  Procedure(s) Performed: CREATION OF LEFT UPPER  ARM ARTERIOVENOUS  BRACHIOBASILIC FISTULA (Left: Arm Upper)  Patient Location: PACU  Anesthesia Type:General  Level of Consciousness: awake, alert , and oriented  Airway & Oxygen  Therapy: Patient Spontanous Breathing and Patient connected to tracheostomy mask oxygen   Post-op Assessment: Report given to RN and Post -op Vital signs reviewed and stable  Post vital signs: Reviewed and stable  Last Vitals:  Vitals Value Taken Time  BP 158/94 06/30/24 13:24  Temp    Pulse 67 06/30/24 13:29  Resp 16 06/30/24 13:29  SpO2 97 % 06/30/24 13:29  Vitals shown include unfiled device data.  Last Pain:  Vitals:   06/30/24 0800  TempSrc:   PainSc: 0-No pain      Patients Stated Pain Goal: 0 (06/30/24 0800)  Complications: No notable events documented.

## 2024-06-30 NOTE — Anesthesia Postprocedure Evaluation (Signed)
 Anesthesia Post Note  Patient: Jack Cox  Procedure(s) Performed: CREATION OF LEFT UPPER  ARM ARTERIOVENOUS  BRACHIOBASILIC FISTULA (Left: Arm Upper)     Patient location during evaluation: PACU Anesthesia Type: General Level of consciousness: awake Pain management: pain level controlled Vital Signs Assessment: post-procedure vital signs reviewed and stable Respiratory status: spontaneous breathing, nonlabored ventilation and respiratory function stable Cardiovascular status: blood pressure returned to baseline and stable Postop Assessment: no apparent nausea or vomiting Anesthetic complications: no   No notable events documented.  Last Vitals:  Vitals:   06/30/24 1345 06/30/24 1400  BP: (!) 159/94 (!) 161/96  Pulse: 63 63  Resp: 14 13  Temp:  36.7 C  SpO2: 94% 95%    Last Pain:  Vitals:   06/30/24 1400  TempSrc:   PainSc: 0-No pain                 Shooter Tangen P Jacelynn Hayton

## 2024-06-30 NOTE — Discharge Instructions (Signed)

## 2024-06-30 NOTE — H&P (Signed)
 HD ACCESS CENTER H&P   Patient seen and examined in preop holding.  No complaints. No changes to medication history or physical exam since last seen. In short, patient is a 58 year old male with end-stage renal disease previous access left-sided brachiocephalic fistula which is now occluded.  He is currently being dialyzed through a tunneled dialysis catheter.  He does not have any vein mapping.  We discussed left arm access either brachiobasilic fistula versus arteriovenous graft.  He is aware that should the basilic vein looks suitable, this would require a subsequent operation to superficialize.  After discussing the risks and benefits of left arm fistula v graft, Jack Cox elected to proceed.   Jack BRAVO Rim MD   Patient ID: Jack Cox, male   DOB: Nov 21, 1965, 58 y.o.   MRN: [id number]  Subjective:     HPI Jack Cox is a 58 y.o. male with ESRD presenting to the HD access center for intervention.  Past Medical History:  Diagnosis Date   Anemia    CKD (chronic kidney disease)    Congestive heart failure (CHF) (HCC)    Dysrhythmia    a-fib   Essential hypertension    Fatigue    GERD (gastroesophageal reflux disease)    Gout    Hypercholesteremia    Insomnia    Pneumonia    Proteinuria    Renal insufficiency    Shortness of breath    Sleep apnea    use biPAP at night and on oxygen  2L via [place] during day   Tracheostomy dependence (HCC)    Family History  Problem Relation Age of Onset   Stomach cancer Mother    Colon cancer Father    Past Surgical History:  Procedure Laterality Date   A/V FISTULAGRAM N/A 03/17/2024   Procedure: A/V Fistulagram;  Surgeon: Pearline Norman RAMAN, MD;  Location: [facility] INVASIVE CV LAB;  Service: Cardiovascular;  Laterality: N/A;   A/V SHUNT INTERVENTION N/A 05/14/2024   Procedure: A/V SHUNT INTERVENTION;  Surgeon: Pearline Norman RAMAN, MD;  Location: [facility] PV LAB;  Service: Cardiovascular;  Laterality: N/A;   AV FISTULA PLACEMENT  Left 09/04/2023   Procedure: LEFT ARM Brachial Cephalic ARTERIOVENOUS (AV) FISTULA CREATION;  Surgeon: Cox Jack BRAVO, MD;  Location: [facility] OR;  Service: Vascular;  Laterality: Left;   DIALYSIS/PERMA CATHETER INSERTION N/A 05/14/2024   Procedure: DIALYSIS/PERMA CATHETER INSERTION;  Surgeon: Pearline Norman RAMAN, MD;  Location: [facility];  Service: Cardiovascular;  Laterality: N/A;   IR GASTROSTOMY TUBE MOD SED  11/10/2020   TRACHELECTOMY     in place   VENOUS ANGIOPLASTY  05/14/2024   Procedure: VENOUS ANGIOPLASTY;  Surgeon: Pearline Norman RAMAN, MD;  Location: [facility];  Service: Cardiovascular;;  cephalic arch    Short Social History:  Social History   Tobacco Use   Smoking status: Never   Smokeless tobacco: Never  Substance Use Topics   Alcohol use: Not Currently    Comment: once in a while    No Known Allergies  Current Facility-Administered Medications  Medication Dose Route Frequency Provider Last Rate Last Admin   0.9 %  sodium chloride  infusion   Intravenous Continuous Keneth Lynwood POUR, MD 10 mL/hr at 06/30/24 0809 New Bag at 06/30/24 0809   ceFAZolin  (ANCEF ) IVPB 2g/100 mL premix  2 g Intravenous 30 min Pre-Op  Gretta Lonni PARAS, MD       chlorhexidine  (HIBICLENS ) 4 % liquid 4 Application  60 mL Topical Once Gretta Lonni PARAS, MD  And   [START ON 07/01/2024] chlorhexidine  (HIBICLENS ) 4 % liquid 4 Application  60 mL Topical Once Gretta Lonni PARAS, MD       sodium chloride  flush (NS) 0.9 % injection 3-10 mL  3-10 mL Intravenous PRN Gretta Lonni PARAS, MD        REVIEW OF SYSTEMS All other systems were reviewed and are negative     Objective:   Objective   Vitals:   06/27/24 1033 06/30/24 0754  BP:  (!) 159/95  Pulse:  71  Resp:  20  Temp:  98.8 F (37.1 C)  TempSrc:  Oral  SpO2:  97%  Weight: 111.1 kg 109.8 kg  Height: 5' 7 (1.702 m) 5' 7 (1.702 m)   Body mass index is 37.9 kg/m.  Physical Exam General: no acute distress Cardiac: hemodynamically  stable Extremities: Palpable pulse in left arm brachiocephalic fistula  Data: Fistulogram from 03/17/2024 reviewed, 8 mm x 60 mm balloon angioplasty of outflow vein stenosis     Assessment/Plan:   Jack Cox is a 58 y.o. male with ESRD presenting for intervention.  Having issues with clotting and unable to run at his last session on Saturday. Reviewed risks and benefits of fistulogram, possible thrombectomy, possible tunneled dialysis catheter and patient agreed to proceed.   Norman Serve, MD [facility]

## 2024-07-01 ENCOUNTER — Encounter (HOSPITAL_COMMUNITY): Payer: Self-pay | Admitting: Vascular Surgery

## 2024-07-06 DIAGNOSIS — Z992 Dependence on renal dialysis: Secondary | ICD-10-CM | POA: Diagnosis not present

## 2024-07-06 DIAGNOSIS — N186 End stage renal disease: Secondary | ICD-10-CM | POA: Diagnosis not present

## 2024-07-15 ENCOUNTER — Other Ambulatory Visit: Payer: Self-pay

## 2024-07-15 DIAGNOSIS — N186 End stage renal disease: Secondary | ICD-10-CM

## 2024-08-05 DIAGNOSIS — N186 End stage renal disease: Secondary | ICD-10-CM | POA: Diagnosis not present

## 2024-08-05 DIAGNOSIS — Z992 Dependence on renal dialysis: Secondary | ICD-10-CM | POA: Diagnosis not present

## 2024-08-14 ENCOUNTER — Ambulatory Visit (HOSPITAL_COMMUNITY)
Admission: RE | Admit: 2024-08-14 | Discharge: 2024-08-14 | Disposition: A | Discharge status: Home / Self Care | Source: Ambulatory Visit | Attending: Vascular Surgery | Admitting: Vascular Surgery

## 2024-08-14 ENCOUNTER — Ambulatory Visit (INDEPENDENT_AMBULATORY_CARE_PROVIDER_SITE_OTHER): Admitting: Physician Assistant

## 2024-08-14 VITALS — BP 118/76 | HR 71 | Temp 98.3°F | Wt 246.7 lb

## 2024-08-14 DIAGNOSIS — N186 End stage renal disease: Secondary | ICD-10-CM

## 2024-08-14 NOTE — Progress Notes (Signed)
 POST OPERATIVE OFFICE NOTE    CC:  F/u for surgery  HPI:  Jack Cox is a 58 y.o. male who is here for postop visit.  He recently underwent creation of a left brachiobasilic AV fistula on 06/30/2024 by Dr. Lanis.  This was done for permanent dialysis access.  He has a history of left upper extremity brachiocephalic fistula that has thrombosed.  He returns today for follow-up.  He has no complaints at today's visit.  He denies any issues with his left arm incision such as drainage or redness.  He denies any symptoms of steal in the left hand such as weakness, numbness, pain, or excessive coldness.  He currently dialyzes through a right IJ TDC.   No Known Allergies  Current Outpatient Medications  Medication Sig Dispense Refill   allopurinol (ZYLOPRIM) 300 MG tablet Take 300 mg by mouth daily.     amLODipine (NORVASC) 10 MG tablet Take 10 mg by mouth daily.     apixaban  (ELIQUIS ) 5 MG TABS tablet Place 1 tablet (5 mg total) into feeding tube 2 (two) times daily. (Patient taking differently: Take 5 mg by mouth 2 (two) times daily.) 60 tablet    calcitRIOL (ROCALTROL) 0.25 MCG capsule Take 0.25 mcg by mouth daily.     Cholecalciferol (VITAMIN D) 50 MCG (2000 UT) tablet Take 2,000 Units by mouth daily.     cloNIDine  (CATAPRES ) 0.3 MG tablet Take 0.3 mg by mouth 3 (three) times daily.     ferrous sulfate 325 (65 FE) MG tablet Take 325 mg by mouth daily. Given at dialysis     hydrALAZINE (APRESOLINE) 100 MG tablet Take 100 mg by mouth 3 (three) times daily.     losartan (COZAAR) 25 MG tablet Take 25 mg by mouth daily.     metoprolol  tartrate (LOPRESSOR ) 25 MG tablet Take 25 mg by mouth 2 (two) times daily.     promethazine (PHENERGAN) 12.5 MG tablet Take 12.5 mg by mouth every 8 (eight) hours as needed for vomiting or nausea.     torsemide (DEMADEX) 20 MG tablet Take 20 mg by mouth 2 (two) times daily.     traMADol  (ULTRAM ) 50 MG tablet Take 1 tablet (50 mg total) by mouth every 6 (six)  hours as needed for moderate pain (pain score 4-6). 12 tablet 0   No current facility-administered medications for this visit.     ROS:  See HPI  Physical Exam:  Incision: Well-healed left AC fossa incision without signs of infection Extremities: Palpable left radial pulse.  No bruit or thrill within the left arm fistula Neuro: Intact motor and sensation of the left hand  Dialysis duplex(08/14/2024) Occluded left brachiobasilic fistula    Assessment/Plan:  This is a 58 y.o. male who presents today for postop check  - Patient recently underwent creation of a left brachiobasilic fistula.  This was done for permanent dialysis access.  He does have a previous left brachiocephalic fistula that is thrombosed - His left arm incision is well-healed without signs of infection or hematoma -He has a palpable left radial pulse.  He has no symptoms of steal.  He has intact motor and sensation of the left hand -Unfortunately duplex confirms an occluded left brachiobasilic fistula.  This has no thrill or bruit on exam -Dr.Robins has also seen the patient.  We have explained to the patient that the best next step is to pursue bilateral upper extremity venogram to plan for future dialysis access.  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  He is agreeable to venogram.  We will schedule this with Dr. Lanis in the next couple of weeks.  For now he can continue dialysis through his [facility]   Jack Cox, [place] [facility] [phone]   Clinic MD:  Lanis

## 2024-09-02 ENCOUNTER — Other Ambulatory Visit: Payer: Self-pay

## 2024-09-02 DIAGNOSIS — N186 End stage renal disease: Secondary | ICD-10-CM

## 2024-09-10 ENCOUNTER — Other Ambulatory Visit: Payer: Self-pay

## 2024-09-10 ENCOUNTER — Ambulatory Visit (HOSPITAL_COMMUNITY)
Admission: RE | Admit: 2024-09-10 | Discharge: 2024-09-10 | Disposition: A | Discharge status: Home / Self Care | Attending: Vascular Surgery | Admitting: Vascular Surgery

## 2024-09-10 ENCOUNTER — Encounter (HOSPITAL_COMMUNITY)
Admission: RE | Disposition: A | Discharge status: Home / Self Care | Payer: Self-pay | Source: Home / Self Care | Attending: Vascular Surgery

## 2024-09-10 DIAGNOSIS — N186 End stage renal disease: Secondary | ICD-10-CM | POA: Diagnosis not present

## 2024-09-10 DIAGNOSIS — I871 Compression of vein: Secondary | ICD-10-CM

## 2024-09-10 DIAGNOSIS — I132 Hypertensive heart and chronic kidney disease with heart failure and with stage 5 chronic kidney disease, or end stage renal disease: Secondary | ICD-10-CM | POA: Diagnosis present

## 2024-09-10 DIAGNOSIS — I509 Heart failure, unspecified: Secondary | ICD-10-CM | POA: Diagnosis not present

## 2024-09-10 DIAGNOSIS — Z992 Dependence on renal dialysis: Secondary | ICD-10-CM | POA: Diagnosis not present

## 2024-09-10 DIAGNOSIS — Z95828 Presence of other vascular implants and grafts: Secondary | ICD-10-CM

## 2024-09-10 HISTORY — PX: UPPER EXTREMITY VENOGRAPHY: CATH118272

## 2024-09-10 HISTORY — PX: UPPER EXTREMITY INTERVENTION: CATH118271

## 2024-09-10 LAB — POCT I-STAT, CHEM 8
BUN: 41 mg/dL — ABNORMAL HIGH (ref 6–20)
Calcium, Ion: 1.22 mmol/L (ref 1.15–1.40)
Chloride: 102 mmol/L (ref 98–111)
Creatinine, Ser: 9.5 mg/dL — ABNORMAL HIGH (ref 0.61–1.24)
Glucose, Bld: 87 mg/dL (ref 70–99)
HCT: 39 % (ref 39.0–52.0)
Hemoglobin: 13.3 g/dL (ref 13.0–17.0)
Potassium: 4.3 mmol/L (ref 3.5–5.1)
Sodium: 140 mmol/L (ref 135–145)
TCO2: 26 mmol/L (ref 22–32)

## 2024-09-10 SURGERY — UPPER EXTREMITY VENOGRAPHY
Anesthesia: LOCAL

## 2024-09-10 MED ORDER — HEPARIN (PORCINE) IN NACL 1000-0.9 UT/500ML-% IV SOLN
INTRAVENOUS | Status: DC | PRN
Start: 2024-09-10 — End: 2024-09-10
  Administered 2024-09-10: 500 mL

## 2024-09-10 MED ORDER — SODIUM CHLORIDE 0.9% FLUSH: 3.0000 mL | INTRAVENOUS | Status: DC | PRN

## 2024-09-10 MED ORDER — IODIXANOL 320 MG/ML IV SOLN
INTRAVENOUS | Status: DC | PRN
  Administered 2024-09-10: 30 mL

## 2024-09-10 SURGICAL SUPPLY — 2 items
STOPCOCK MORSE 400PSI 3WAY (MISCELLANEOUS) IMPLANT
TUBING CIL FLEX 10 FLL-RA (TUBING) IMPLANT

## 2024-09-10 NOTE — H&P (Signed)
 HD ACCESS CENTER H&P   Patient seen and examined in preop holding.  No complaints. No changes to medication history or physical exam since last seen. In short, patient is a 58 year old male with end-stage renal disease previous access left-sided brachiobasilic fistula which is now occluded.  He is currently being dialyzed through a tunneled dialysis catheter.  At his last visit, I was concerned about stenosis.  We discussed holding on access creation until bilateral upper extremity venogram.  He presents today for bilateral upper extremity venogram.  Jack Cox Rim MD   Patient ID: Jack Cox All, male   DOB: May 02, 1966, 58 y.o.   MRN: [id number]  Subjective:     HPI Jack Cox is a 58 y.o. male with ESRD presenting to the HD access center for intervention.  Past Medical History:  Diagnosis Date   Anemia    CKD (chronic kidney disease)    Congestive heart failure (CHF) (HCC)    Dysrhythmia    a-fib   Essential hypertension    Fatigue    GERD (gastroesophageal reflux disease)    Gout    Hypercholesteremia    Insomnia    Pneumonia    Proteinuria    Renal insufficiency    Shortness of breath    Sleep apnea    use biPAP at night and on oxygen  2L via [place] during day   Tracheostomy dependence (HCC)    Family History  Problem Relation Age of Onset   Stomach cancer Mother    Colon cancer Father    Past Surgical History:  Procedure Laterality Date   A/V FISTULAGRAM N/A 03/17/2024   Procedure: A/V Fistulagram;  Surgeon: Pearline Norman RAMAN, MD;  Location: [facility] INVASIVE CV LAB;  Service: Cardiovascular;  Laterality: N/A;   A/V SHUNT INTERVENTION N/A 05/14/2024   Procedure: A/V SHUNT INTERVENTION;  Surgeon: Pearline Norman RAMAN, MD;  Location: [facility];  Service: Cardiovascular;  Laterality: N/A;   AV FISTULA PLACEMENT Left 09/04/2023   Procedure: LEFT ARM Brachial Cephalic ARTERIOVENOUS (AV) FISTULA CREATION;  Surgeon: Cox Jack FORBES, MD;  Location: [facility] OR;  Service:  Vascular;  Laterality: Left;   DIALYSIS/PERMA CATHETER INSERTION N/A 05/14/2024   Procedure: DIALYSIS/PERMA CATHETER INSERTION;  Surgeon: Pearline Norman RAMAN, MD;  Location: [facility];  Service: Cardiovascular;  Laterality: N/A;   INSERTION OF ARTERIOVENOUS (AV) ARTEGRAFT ARM Left 06/30/2024   Procedure: CREATION OF LEFT UPPER  ARM ARTERIOVENOUS  BRACHIOBASILIC FISTULA;  Surgeon: Cox Jack FORBES, MD;  Location: [facility] OR;  Service: Vascular;  Laterality: Left;   IR GASTROSTOMY TUBE MOD SED  11/10/2020   TRACHELECTOMY     in place   VENOUS ANGIOPLASTY  05/14/2024   Procedure: VENOUS ANGIOPLASTY;  Surgeon: Pearline Norman RAMAN, MD;  Location: [facility];  Service: Cardiovascular;;  cephalic arch    Short Social History:  Social History   Tobacco Use   Smoking status: Never   Smokeless tobacco: Never  Substance Use Topics   Alcohol use: Not Currently    Comment: once in a while    No Known Allergies  Current Facility-Administered Medications  Medication Dose Route Frequency Provider Last Rate Last Admin   Heparin  (Porcine) in NaCl 1000-0.9 UT/500ML-% SOLN    PRN Abdulahad Mederos E, MD   500 mL at 09/10/24 0819   iodixanol  (VISIPAQUE ) 320 MG/ML injection    PRN Panfilo Ketchum E, MD   30 mL at 09/10/24 0836   sodium chloride  flush (NS) 0.9 % injection 3 mL  3 mL Intravenous PRN Renatta Shrieves E, MD        REVIEW OF SYSTEMS All other systems were reviewed and are negative     Objective:   Objective   Vitals:   09/10/24 0819 09/10/24 0823 09/10/24 0828 09/10/24 0833  BP: (!) 163/89 (!) 184/90 (!) 191/92 (!) 191/92  Pulse: 68 66 66 (!) 0  Resp: 15 18 19    Temp:      SpO2: 98% 98% 99%   Weight:      Height:       Body mass index is 38.37 kg/m.  Physical Exam General: no acute distress Cardiac: hemodynamically stable Extremities: Palpable pulse in left arm brachiocephalic fistula  Data: Fistulogram from 03/17/2024 reviewed, 8 mm x 60 mm balloon angioplasty of outflow vein stenosis      Assessment/Plan:   Jack Cox is a 58 y.o. male with ESRD presenting for intervention.  Having issues with clotting and unable to run at his last session on Saturday. Reviewed risks and benefits of fistulogram, possible thrombectomy, possible tunneled dialysis catheter and patient agreed to proceed.   Jack Cox Rim MD [facility]

## 2024-09-10 NOTE — Progress Notes (Signed)
 Pt requested to walk out with wife to another part of the hospital, risk reviewed w/patient.

## 2024-09-10 NOTE — Op Note (Signed)
    NAME: [name]    MRN: [id number] DOB: Sep 19, 1966    DATE OF OPERATION: 09/10/2024  PREOP DIAGNOSIS:    End-stage renal disease requiring dialysis  POSTOP DIAGNOSIS:    Same  PROCEDURE:    Bilateral upper extremity venogram  SURGEON: Fonda FORBES Rim   INDICATIONS:    Jack Cox is a 58 y.o. male with end-stage renal disease and need for long-term HD access.  He has previously undergone left arm brachiobasilic fistula which occluded.  He presents today for venogram due to some concern for central stenosis.  After discussing the risks and benefits, Zacarias elected to proceed  FINDINGS:  On the right, widely patent cephalic vein.  Large brachial vein. The basilic vein appears to enter the deep system early.  Possible 50% central stenosis at the level of the tunneled dialysis catheter in the innominate vein.  On the left: Small but patent cephalic vein.  Widely patent brachial veins.  No central stenosis.   TECHNIQUE:   Peripheral IVs were placed in bilateral arms at the level of the antecubital fossa, and venography followed.  See results above.  Patient is a candidate for bilateral upper extremity access: Left-sided brachial artery to AV graft Right sided brachiocephalic fistula, possible brachiobasilic fistula, brachial artery AV graft. I discussed these options with [name].  He would like to stay in the left arm.  Will work to schedule left arm brachial artery to axillary vein AV graft.  Fonda FORBES Rim, MD [facility] DATE OF DICTATION:   09/10/2024

## 2024-09-11 ENCOUNTER — Encounter (HOSPITAL_COMMUNITY): Payer: Self-pay | Admitting: Vascular Surgery

## 2024-09-11 ENCOUNTER — Other Ambulatory Visit: Payer: Self-pay

## 2024-09-11 DIAGNOSIS — N186 End stage renal disease: Secondary | ICD-10-CM

## 2024-11-11 ENCOUNTER — Encounter (HOSPITAL_COMMUNITY): Payer: Self-pay | Admitting: Surgery

## 2024-11-11 ENCOUNTER — Other Ambulatory Visit: Payer: Self-pay

## 2024-11-11 NOTE — Progress Notes (Signed)
 SDW CALL  Patient was given pre-op  instructions over the phone. The opportunity was given for the patient to ask questions. No further questions asked. Patient verbalized understanding of instructions given.   PCP - Maree Isles, MD  Cardiologist - [facility] in [facility] - Dr Saralee Stank   PPM/ICD - denies   Chest x-ray -  EKG - 09/10/24 Stress Test - denies ECHO - 08/09/23- c.e Cardiac Cath - denies  Sleep Study - 04/24/22- uses CPAP with 4Lnc at night, 2Lnc PRN during day  Fasting Blood Sugar - NA   Blood Thinner Instructions: Eliquis - last dose 11/10/24 per pt report Aspirin Instructions:  ERAS Protcol - NPO  COVID TEST- NA   Anesthesia review: yes- see previous note from Allison Zelenak. Pt does have a tracheostomy in place. Pt reports he goes to HD on TTHSat. Pt denies cardiac symptoms. Pt states that the [place] is sending him to get a stress test on 11/19/24.   Patient denies shortness of breath, fever, cough and chest pain over the phone call    Surgical Instructions    Your procedure is scheduled on November 14, 2024  Report to [facility] Main Entrance A at 7:50 A.M., then check in with the Admitting office.  Call this number if you have problems the morning of surgery:  [phone]    Remember:  Do not eat or drink after midnight the night before your surgery   Take these medicines the morning of surgery with A SIP OF WATER :  Allopurinol, amlodipine, clonidine , hydralazine, metoprolol , pantoprazole , rosuvastatin   As of today, STOP taking any Aspirin (unless otherwise instructed by your surgeon) Aleve, Naproxen, Ibuprofen, Motrin, Advil, Goody's, BC's, all herbal medications, fish oil, and all vitamins.  [facility] is not responsible for any belongings or valuables. .    Bring extra tracheostomy, obturator/supplies on the day of surgery  Contacts, glasses, hearing aids, dentures or partials may not be worn into surgery, please bring cases for  these belongings   Patients discharged the day of surgery will not be allowed to drive home, and someone needs to stay with them for 24 hours.   SURGICAL WAITING ROOM VISITATION You may have 2 visitor in the pre-op  area at a time determined by the pre-op  nurse. (Visitor may not switch out) Patients having surgery or a procedure in a hospital may have two support people in the waiting room. Children under the age of 44 must have an adult with them who is not the patient.    Day of Surgery:  Take a shower the day of or night before with antibacterial soap. Wear Clean/Comfortable clothing the morning of surgery Do not apply any deodorants/lotions.   Do not wear jewelry or makeup Do not wear lotions, powders, perfumes/colognes, or deodorant. Men may shave face and neck. Do not bring valuables to the hospital.  Remember to brush your teeth WITH YOUR REGULAR TOOTHPASTE.

## 2024-11-11 NOTE — Progress Notes (Signed)
 Anesthesia Chart Review: SAME DAY WORK-UP  Case: [id number] Date/Time: 11/14/24 1007   Procedures:      INSERTION, GRAFT, ARTERIOVENOUS, UPPER EXTREMITY (Left)     EXCHANGE OF A DIALYSIS CATHETER   Anesthesia type: Choice   Diagnosis: ESRD (end stage renal disease) (HCC) [N18.6]   Pre-op  diagnosis: ESRD   [facility]: [facility] ROOM 16 / [facility] OR   Surgeons: Serene Gaile ORN, MD       DISCUSSION: Patient is a 59 year old male scheduled for the above procedure.S/p creation of left brachiobasilic AVF 06/30/2024 which occluded prematurely. 09/10/2024 BUE venogram indicated he had BUE access options. 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 The above procedures planned. He is currently dialyzing via right chest TDC.   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    Last cardiology visit noted was on 06/17/2024 with Dr. Gil at [facility]. Seen for follow-up chronic HFpEF, PAF, HTN, HLD. Meds include amlodipine, clonidine , hydralazine, losartan, torsemide, pravastatin, metoprolol , apixaban . Continue current meds, but consider stopping torsemide if no long making urine once on HD a bit longer. Update cholesterol panel at next visit. He wrote, Preoperative CV examination Overall doing well with no s/sx of angina. No further workup required prior to planned procedures such as fistula revision.  His next [facility] cardiology visit is scheduled for 12/17/2024. I called to speak with him since RN notes indicate plans for future stress test at the [facility]. He reported that he last saw cardiology at [facility] in August, as this is closest to his house, but indicated he had also seen a cardiologist at the [facility] and is on the transplant list there. He  indicated he had a routine stress test scheduled for 11/19/2024. He indicated this was planned even prior to this last vascular access (possibly to evaluate for on-going eligibility for the transplant list, although he cannot tell me with certainty but said it was not ordered for any new symptoms.) I could not readily find any [facility] cardiology notes, but after I got off the phone with Mr. Lona, I was able to locate a [facility] Pre-Transplant RN notation from 10/14/2024 indicating that cardiac testing was ordered as part of his pre-renal transplant evaluation.   He denied chest pain, new SOB, palpitations, or recurrent syncope. He tries to stay active on no-dialysis days, but does not do routine exercise. He walks to his friend's home a few blocks down the road. He does not have access to stairs. He undergoes HD TTS for ~ 3.5 hours and overall tolerates it well with occasional transient hypotension. He reports feeling well with no new symptoms since HD access surgery in August 2025.   TTE 08/09/2023 showed EF 55%, normal RV systolic function, no significant valvular abnormalities .Ambulatory ECG monitoring in 08/2023 showed predominant SR, first degree AVB, BBB/IVCD, rare PACs, 2008 episodes of SVT (fastest 14 beats at 179 bpm and longest 15.4 seconds), rare PVCs, no pauses >3 seconds or high-grade AV block detected, no afib.    He has severe OSA s/p tracheostomy in 2001. By notes, he was diagnosed with severe OSA in 1992, and had a urological procedure at [facility] and woke up with a tracheostomy which he has had ever since. As of 09/10/2024 trach change he had  a [id number] trach. Per 04/16/2023 sleep medicine visit, He has tested AHI of 18.7 on spit night despite having trach. He wears his trache plugged. He was titrated for Bipap. He returns for adherance follow up. Hr is doing well without concerns. Hr has residual AHI of 1.7 with no clinical leak. He has no concerns for need of pressure changes. He has usage  average of 90% great than 4 hours per night with average use of 6 hours and 26 minutes. He will continue to use and benefit from pap therapy. He reportedly uses 4L supplemental O2 via [place] at night and 2L during the day. (Says he has a size 4 trach.)   VVS advised holding Eliquis  for 3 days prior to procedure, last dose 11/10/2024.  He was cleared for future HD access procedure at his last [facility] cardiology evaluation in August 2025. He denied any new symptoms since then and has been tolerating HD. He does have pending routine cardiac testing through the [facility] later this month as part of his pre-renal transplant evaluation (not for new symptoms). I updated anesthesiologist Tilford Drivers, MD.   Anesthesia team to evaluate on the day of surgery. Definitive plan following evaluation.    VS:  Wt Readings from Last 3 Encounters:  09/10/24 111.1 kg  08/14/24 111.9 kg  06/30/24 109.8 kg   BP Readings from Last 3 Encounters:  09/10/24 (!) 148/91  08/14/24 118/76  06/30/24 (!) 161/96   Pulse Readings from Last 3 Encounters:  09/10/24 70  08/14/24 71  06/30/24 63     PROVIDERS: Maree Isles, MD is PCP  Gil Drivers Barter, MD is cardiologist [facility] [place]) Dennise Capri, MD is nephrologist Lue Katz, MD is ENT ([facility]), last visit 09/12/2023 for trach tube change. New [id number] was placed with obturator (trach supplied from clinic as patient did not bring one). Obturator was removed and trach was secured with ties. Inner canula was placed. Jamal was in good position with good airflow through trach tube. Flexible suction was passed easily. He has visit with Janetta Lukes, MD on 07/04/2024. Tiburcio Krabbe, NP is Sleep Clinic provider ([facility])   LABS: For day of surgery. Most recent results in [facility] include:  Lab Results  Component Value Date   HGB 13.3 09/10/2024   HCT 39.0 09/10/2024   GLUCOSE 87 09/10/2024   NA 140 09/10/2024   K 4.3 09/10/2024   CL 102  09/10/2024   CREATININE 9.50 (H) 09/10/2024   BUN 41 (H) 09/10/2024    OTHER: Split Sleep Study 12/09/2022 ([facility]; as outlined in 01/08/2023 note): An optimal PAP pressure could not be selected for this patient based on the available study data. CPAP failed tocontrol obstructive sleep apnea (14 cwp was temporarily satisfactory, but with later events was changed toBiPAP and titrated to 17/11 where significant leak was encountered). Recommend autoBiPAP trial and therapy. Central sleep apnea was not noted during this titration (CAI = 1.3/h). Severe oxygen  desaturations were observed during this titration (min O2 = 73%). The patient snored with moderate snoring volume. No cardiac abnormalities were observed during this study. Clinically significant periodic limb movements were not noted during this study (PLMI = 4.0/h). Arousals associated with PLMs were rare.      IMAGES: US  Renal 06/02/2024 [facility] CE): Impression: No hydronephrosis. Evidence of medical renal disease as above.      EKG: EKG 09/10/2024: Sinus rhythm with 1st degree A-V block  Right bundle branch block  Left anterior fasicular block  T wave  abnormality, consider lateral ischemia  Abnormal ECG  Confirmed by Barbaraann Kotyk [phone]) on 09/10/2024 9:56:46 PM - EKG appears overall stable when compared to 09/04/2023 tracing.     CV: Ambulatory ECG 08/20/2023 - 09/03/2023 [facility] CE): Conclusions:  - Ambulatory ECG monitoring was performed from 08/20/23 to 09/03/23, with  ~13.5 days of analyzable data.  -  The predominant rhythm was sinus rhythm, with sinus rate ranging from  54 to 111 and averaging 73 bpm.  -  First Degree AV Block was present. Bundle Branch Block/IVCD was  present.  -  Rare PACs were recorded.  -  2008 episodes of asymptomatic SVT occurred, the run with the fastest  interval lasting 14 beats with a max rate of 179 bpm, the longest lasting  15.4 secs with an avg rate of 103 bpm.  -  Rare PVCs were recorded  with no recorded episodes of wide complex  tachycardia.  - There were no diary entries or patient triggered events.  -  No pauses >3 seconds or high-grade AV block detected.  -  No atrial fibrillation detected.      TTE 08/09/2023 [facility] CE): Summary   1. The left ventricle is normal in size with moderately increased wall  thickness.   2. The left ventricular systolic function is normal, LVEF is visually  estimated at 55%.    3. The right ventricle is upper normal in size, with normal systolic  function.     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[facility])     Past Surgical History:  Procedure Laterality Date   A/V FISTULAGRAM N/A 03/17/2024   Procedure: A/V Fistulagram;  Surgeon: Pearline Norman RAMAN, MD;  Location: [facility] INVASIVE CV LAB;  Service: Cardiovascular;  Laterality: N/A;   A/V SHUNT INTERVENTION N/A 05/14/2024   Procedure: A/V SHUNT INTERVENTION;  Surgeon: Pearline Norman RAMAN, MD;  Location: [facility] PV LAB;  Service: Cardiovascular;  Laterality: N/A;   AV FISTULA PLACEMENT Left 09/04/2023   Procedure: LEFT ARM Brachial Cephalic ARTERIOVENOUS (AV) FISTULA CREATION;  Surgeon: Lanis Fonda BRAVO, MD;  Location: [facility] OR;  Service: Vascular;  Laterality: Left;   DIALYSIS/PERMA CATHETER INSERTION N/A 05/14/2024   Procedure: DIALYSIS/PERMA CATHETER INSERTION;  Surgeon: Pearline Norman RAMAN, MD;  Location: [facility] PV LAB;  Service: Cardiovascular;  Laterality: N/A;   INSERTION OF ARTERIOVENOUS (AV) ARTEGRAFT ARM Left 06/30/2024   Procedure: CREATION OF LEFT UPPER  ARM ARTERIOVENOUS  BRACHIOBASILIC FISTULA;   Surgeon: Lanis Fonda BRAVO, MD;  Location: [facility] OR;  Service: Vascular;  Laterality: Left;   IR GASTROSTOMY TUBE MOD SED  11/10/2020   TRACHEOSTOMY     size 4 with speaking valve- 2001   UPPER EXTREMITY INTERVENTION N/A 09/10/2024   Procedure: UPPER EXTREMITY INTERVENTION;  Surgeon: Lanis Fonda BRAVO, MD;  Location: [facility] INVASIVE CV LAB;  Service: Cardiovascular;  Laterality: N/A;   UPPER EXTREMITY VENOGRAPHY N/A 09/10/2024   Procedure: UPPER EXTREMITY VENOGRAPHY;  Surgeon: Lanis Fonda BRAVO,  MD;  Location: [facility] INVASIVE CV LAB;  Service: Cardiovascular;  Laterality: N/A;   VENOUS ANGIOPLASTY  05/14/2024   Procedure: VENOUS ANGIOPLASTY;  Surgeon: Pearline Norman RAMAN, MD;  Location: [facility] PV LAB;  Service: Cardiovascular;;  cephalic arch    MEDICATIONS:  apixaban  (ELIQUIS ) 5 MG TABS tablet   allopurinol (ZYLOPRIM) 300 MG tablet   amLODipine (NORVASC) 10 MG tablet   calcium acetate (PHOSLO) 667 MG capsule   Cholecalciferol (VITAMIN D) 50 MCG (2000 UT) tablet   cloNIDine  (CATAPRES ) 0.3 MG tablet   ferrous sulfate 325 (65 FE) MG tablet   hydrALAZINE (APRESOLINE) 100 MG tablet   losartan (COZAAR) 25 MG tablet   metoprolol  tartrate (LOPRESSOR ) 50 MG tablet   pantoprazole  (PROTONIX ) 40 MG tablet   rosuvastatin (CRESTOR) 5 MG tablet   traMADol  (ULTRAM ) 50 MG tablet    Isaiah Ruder, PA-C Surgical Short Stay/Anesthesiology [facility] Phone [phone] [facility] Phone [phone] 11/11/2024 5:47 PM

## 2024-11-11 NOTE — Anesthesia Preprocedure Evaluation (Addendum)
"                                    Anesthesia Evaluation  Patient identified by MRN, date of birth, ID band Patient awake    Reviewed: Allergy & Precautions, NPO status , Patient's Chart, lab work & pertinent test results  Airway Mallampati: Trach       Dental  (+) Teeth Intact   Pulmonary sleep apnea    breath sounds clear to auscultation       Cardiovascular hypertension, +CHF  + dysrhythmias Atrial Fibrillation  Rhythm:Regular Rate:Normal  Echo:   1. Left ventricular ejection fraction, by estimation, is 60 to 65%. The  left ventricle has normal function. The left ventricle has no regional  wall motion abnormalities. There is severe left ventricular hypertrophy.  Left ventricular diastolic parameters   are indeterminate. There is the interventricular septum is flattened in  systole and diastole, consistent with right ventricular pressure and  volume overload.   2. Right ventricular systolic function is mildly reduced. The right  ventricular size is mildly enlarged. Tricuspid regurgitation signal is  inadequate for assessing PA pressure.   3. Right atrial size was upper normal.   4. The pericardial effusion is posterior to the left ventricle.   5. The mitral valve is grossly normal. Trivial mitral valve  regurgitation.   6. The aortic valve is tricuspid. Aortic valve regurgitation is not  visualized.   7. The inferior vena cava is normal in size with greater than 50%  respiratory variability, suggesting right atrial pressure of 3 mmHg.     Neuro/Psych negative neurological ROS  negative psych ROS   GI/Hepatic Neg liver ROS,GERD  ,,  Endo/Other  negative endocrine ROS    Renal/GU Renal disease     Musculoskeletal negative musculoskeletal ROS (+)    Abdominal   Peds  Hematology  (+) Blood dyscrasia, anemia   Anesthesia Other Findings   Reproductive/Obstetrics                              Anesthesia  Physical Anesthesia Plan  ASA: 3  Anesthesia Plan: General   Post-op Pain Management:    Induction: Intravenous  PONV Risk Score and Plan: 2  Airway Management Planned: Tracheostomy  Additional Equipment: None  Intra-op Plan:   Post-operative Plan:   Informed Consent: I have reviewed the patients History and Physical, chart, labs and discussed the procedure including the risks, benefits and alternatives for the proposed anesthesia with the patient or authorized representative who has indicated his/her understanding and acceptance.       Plan Discussed with: CRNA  Anesthesia Plan Comments: (PAT note written 11/11/2024 by Allison Zelenak, PA-C.  )         Anesthesia Quick Evaluation  "

## 2024-11-14 ENCOUNTER — Ambulatory Visit (HOSPITAL_COMMUNITY)

## 2024-11-14 ENCOUNTER — Ambulatory Visit (HOSPITAL_COMMUNITY): Payer: Self-pay | Admitting: Vascular Surgery

## 2024-11-14 ENCOUNTER — Ambulatory Visit (HOSPITAL_COMMUNITY)
Admission: RE | Admit: 2024-11-14 | Discharge: 2024-11-14 | Disposition: A | Discharge status: Home / Self Care | Attending: Surgery | Admitting: Surgery

## 2024-11-14 ENCOUNTER — Other Ambulatory Visit (HOSPITAL_COMMUNITY): Payer: Self-pay

## 2024-11-14 ENCOUNTER — Encounter (HOSPITAL_COMMUNITY)
Admission: RE | Disposition: A | Discharge status: Home / Self Care | Payer: Self-pay | Source: Home / Self Care | Attending: Surgery

## 2024-11-14 ENCOUNTER — Encounter (HOSPITAL_COMMUNITY): Payer: Self-pay | Admitting: Surgery

## 2024-11-14 DIAGNOSIS — I132 Hypertensive heart and chronic kidney disease with heart failure and with stage 5 chronic kidney disease, or end stage renal disease: Secondary | ICD-10-CM | POA: Insufficient documentation

## 2024-11-14 DIAGNOSIS — I5033 Acute on chronic diastolic (congestive) heart failure: Secondary | ICD-10-CM | POA: Diagnosis not present

## 2024-11-14 DIAGNOSIS — D649 Anemia, unspecified: Secondary | ICD-10-CM | POA: Diagnosis not present

## 2024-11-14 DIAGNOSIS — I5032 Chronic diastolic (congestive) heart failure: Secondary | ICD-10-CM | POA: Diagnosis not present

## 2024-11-14 DIAGNOSIS — G4733 Obstructive sleep apnea (adult) (pediatric): Secondary | ICD-10-CM | POA: Insufficient documentation

## 2024-11-14 DIAGNOSIS — I480 Paroxysmal atrial fibrillation: Secondary | ICD-10-CM | POA: Diagnosis not present

## 2024-11-14 DIAGNOSIS — Z992 Dependence on renal dialysis: Secondary | ICD-10-CM | POA: Insufficient documentation

## 2024-11-14 DIAGNOSIS — N186 End stage renal disease: Secondary | ICD-10-CM

## 2024-11-14 DIAGNOSIS — Z7901 Long term (current) use of anticoagulants: Secondary | ICD-10-CM | POA: Insufficient documentation

## 2024-11-14 DIAGNOSIS — E785 Hyperlipidemia, unspecified: Secondary | ICD-10-CM | POA: Diagnosis not present

## 2024-11-14 DIAGNOSIS — M109 Gout, unspecified: Secondary | ICD-10-CM | POA: Diagnosis not present

## 2024-11-14 DIAGNOSIS — Z7682 Awaiting organ transplant status: Secondary | ICD-10-CM | POA: Diagnosis not present

## 2024-11-14 DIAGNOSIS — N184 Chronic kidney disease, stage 4 (severe): Secondary | ICD-10-CM | POA: Diagnosis not present

## 2024-11-14 DIAGNOSIS — I130 Hypertensive heart and chronic kidney disease with heart failure and stage 1 through stage 4 chronic kidney disease, or unspecified chronic kidney disease: Secondary | ICD-10-CM | POA: Diagnosis not present

## 2024-11-14 DIAGNOSIS — T8249XA Other complication of vascular dialysis catheter, initial encounter: Secondary | ICD-10-CM | POA: Diagnosis not present

## 2024-11-14 DIAGNOSIS — K219 Gastro-esophageal reflux disease without esophagitis: Secondary | ICD-10-CM | POA: Diagnosis not present

## 2024-11-14 DIAGNOSIS — Z930 Tracheostomy status: Secondary | ICD-10-CM | POA: Diagnosis not present

## 2024-11-14 DIAGNOSIS — G4730 Sleep apnea, unspecified: Secondary | ICD-10-CM | POA: Diagnosis not present

## 2024-11-14 DIAGNOSIS — I4891 Unspecified atrial fibrillation: Secondary | ICD-10-CM

## 2024-11-14 HISTORY — PX: INSERTION OF ARTERIOVENOUS (AV) ARTEGRAFT ARM: SHX6779

## 2024-11-14 HISTORY — PX: EXCHANGE OF A DIALYSIS CATHETER: SHX5818

## 2024-11-14 LAB — POCT I-STAT, CHEM 8
BUN: 46 mg/dL — ABNORMAL HIGH (ref 6–20)
Calcium, Ion: 0.98 mmol/L — ABNORMAL LOW (ref 1.15–1.40)
Chloride: 103 mmol/L (ref 98–111)
Creatinine, Ser: 9.3 mg/dL — ABNORMAL HIGH (ref 0.61–1.24)
Glucose, Bld: 77 mg/dL (ref 70–99)
HCT: 36 % — ABNORMAL LOW (ref 39.0–52.0)
Hemoglobin: 12.2 g/dL — ABNORMAL LOW (ref 13.0–17.0)
Potassium: 4.4 mmol/L (ref 3.5–5.1)
Sodium: 140 mmol/L (ref 135–145)
TCO2: 25 mmol/L (ref 22–32)

## 2024-11-14 MED ORDER — MIDAZOLAM HCL 2 MG/2ML IJ SOLN
INTRAMUSCULAR | Status: AC
  Filled 2024-11-14: qty 2

## 2024-11-14 MED ORDER — HEPARIN 6000 UNIT IRRIGATION SOLUTION
Status: AC
  Filled 2024-11-14: qty 500

## 2024-11-14 MED ORDER — 0.9 % SODIUM CHLORIDE (POUR BTL) OPTIME
TOPICAL | Status: DC | PRN
  Administered 2024-11-14: 1000 mL

## 2024-11-14 MED ORDER — FENTANYL CITRATE (PF) 100 MCG/2ML IJ SOLN
INTRAMUSCULAR | Status: AC
  Filled 2024-11-14: qty 2

## 2024-11-14 MED ORDER — DROPERIDOL 2.5 MG/ML IJ SOLN: 0.6250 mg | Freq: Once | INTRAMUSCULAR | Status: DC | PRN

## 2024-11-14 MED ORDER — FENTANYL CITRATE (PF) 100 MCG/2ML IJ SOLN: 25.0000 ug | INTRAMUSCULAR | Status: DC | PRN

## 2024-11-14 MED ORDER — HEPARIN SODIUM (PORCINE) 1000 UNIT/ML IJ SOLN
INTRAMUSCULAR | Status: AC
  Filled 2024-11-14: qty 10

## 2024-11-14 MED ORDER — CHLORHEXIDINE GLUCONATE 0.12 % MT SOLN: 15.0000 mL | Freq: Once | OROMUCOSAL | Status: AC

## 2024-11-14 MED ORDER — ACETAMINOPHEN 10 MG/ML IV SOLN: 1000.0000 mg | Freq: Once | INTRAVENOUS | Status: DC | PRN

## 2024-11-14 MED ORDER — FENTANYL CITRATE (PF) 100 MCG/2ML IJ SOLN
INTRAMUSCULAR | Status: DC | PRN
  Administered 2024-11-14: 50 ug via INTRAVENOUS
  Administered 2024-11-14 (×2): 25 ug via INTRAVENOUS
  Administered 2024-11-14: 50 ug via INTRAVENOUS

## 2024-11-14 MED ORDER — SODIUM CHLORIDE 0.9 % IV SOLN: INTRAVENOUS | Status: DC

## 2024-11-14 MED ORDER — STERILE WATER FOR INJECTION IJ SOLN
INTRAMUSCULAR | Status: AC
  Filled 2024-11-14: qty 10

## 2024-11-14 MED ORDER — SUGAMMADEX SODIUM 200 MG/2ML IV SOLN
INTRAVENOUS | Status: DC | PRN
  Administered 2024-11-14: 200 mg via INTRAVENOUS

## 2024-11-14 MED ORDER — CEFAZOLIN SODIUM-DEXTROSE 2-4 GM/100ML-% IV SOLN
2.0000 g | INTRAVENOUS | Status: AC
  Administered 2024-11-14: 2 g via INTRAVENOUS

## 2024-11-14 MED ORDER — OXYCODONE HCL 5 MG/5ML PO SOLN: 5.0000 mg | Freq: Once | ORAL | Status: DC | PRN

## 2024-11-14 MED ORDER — ORAL CARE MOUTH RINSE: 15.0000 mL | Freq: Once | OROMUCOSAL | Status: AC

## 2024-11-14 MED ORDER — PHENYLEPHRINE HCL-NACL 20-0.9 MG/250ML-% IV SOLN
INTRAVENOUS | Status: DC | PRN
  Administered 2024-11-14: 20 ug/min via INTRAVENOUS

## 2024-11-14 MED ORDER — HEPARIN SODIUM (PORCINE) 1000 UNIT/ML IJ SOLN
INTRAMUSCULAR | Status: DC | PRN
  Administered 2024-11-14: 3200 [IU]

## 2024-11-14 MED ORDER — CHLORHEXIDINE GLUCONATE 4 % EX SOLN: 60.0000 mL | Freq: Once | CUTANEOUS | Status: DC

## 2024-11-14 MED ORDER — ONDANSETRON HCL 4 MG/2ML IJ SOLN
INTRAMUSCULAR | Status: DC | PRN
  Administered 2024-11-14: 4 mg via INTRAVENOUS

## 2024-11-14 MED ORDER — CEFAZOLIN SODIUM-DEXTROSE 2-4 GM/100ML-% IV SOLN
INTRAVENOUS | Status: AC
  Filled 2024-11-14: qty 100

## 2024-11-14 MED ORDER — PROPOFOL 10 MG/ML IV BOLUS
INTRAVENOUS | Status: AC
  Filled 2024-11-14: qty 20

## 2024-11-14 MED ORDER — TRAMADOL HCL 50 MG PO TABS
50.0000 mg | ORAL_TABLET | Freq: Four times a day (QID) | ORAL | 0 refills | Status: AC | PRN
  Filled 2024-11-14: qty 12, 3d supply, fill #0

## 2024-11-14 MED ORDER — HEPARIN 6000 UNIT IRRIGATION SOLUTION
Status: DC | PRN
  Administered 2024-11-14: 1

## 2024-11-14 MED ORDER — OXYCODONE HCL 5 MG PO TABS: 5.0000 mg | ORAL_TABLET | Freq: Once | ORAL | Status: DC | PRN

## 2024-11-14 MED ORDER — PROPOFOL 10 MG/ML IV BOLUS
INTRAVENOUS | Status: DC | PRN
  Administered 2024-11-14: 20 mg via INTRAVENOUS
  Administered 2024-11-14: 150 mg via INTRAVENOUS

## 2024-11-14 MED ORDER — MIDAZOLAM HCL 5 MG/5ML IJ SOLN
INTRAMUSCULAR | Status: DC | PRN
  Administered 2024-11-14: 1 mg via INTRAVENOUS

## 2024-11-14 MED ORDER — DEXAMETHASONE SOD PHOSPHATE PF 10 MG/ML IJ SOLN
INTRAMUSCULAR | Status: DC | PRN
  Administered 2024-11-14: 10 mg via INTRAVENOUS

## 2024-11-14 MED ORDER — CHLORHEXIDINE GLUCONATE 0.12 % MT SOLN
OROMUCOSAL | Status: AC
  Administered 2024-11-14: 15 mL via OROMUCOSAL
  Filled 2024-11-14: qty 15

## 2024-11-14 MED ORDER — ROCURONIUM BROMIDE 10 MG/ML (PF) SYRINGE
PREFILLED_SYRINGE | INTRAVENOUS | Status: DC | PRN
  Administered 2024-11-14: 30 mg via INTRAVENOUS
  Administered 2024-11-14: 40 mg via INTRAVENOUS
  Administered 2024-11-14: 30 mg via INTRAVENOUS
  Administered 2024-11-14: 20 mg via INTRAVENOUS

## 2024-11-14 NOTE — Anesthesia Procedure Notes (Signed)
 Procedure Name: Intubation Date/Time: 11/14/2024 10:33 AM  Performed by: Jerl Donald LABOR, CRNAPre-anesthesia Checklist: Patient identified, Emergency Drugs available, Suction available, Patient being monitored and Timeout performed Patient Re-evaluated:Patient Re-evaluated prior to induction Oxygen  Delivery Method: Circle system utilized Preoxygenation: Pre-oxygenation with 100% oxygen  Induction Type: Combination inhalational/ intravenous induction Tube type: 6.5 reinforced ETT placed through tracheostomy. Tube size: 6.5 mm

## 2024-11-14 NOTE — Anesthesia Postprocedure Evaluation (Signed)
"   Anesthesia Post Note  Patient: AKONI PARTON  Procedure(s) Performed: CREATION OF LEFT ARM BRACHIOBASILIC FISTULA (Left: Arm Upper) EXCHANGE OF A DIALYSIS CATHETER USING 19CM PALINDROME CATHETER KIT (Right: Chest)     Patient location during evaluation: PACU Anesthesia Type: General Level of consciousness: awake and alert Pain management: pain level controlled Vital Signs Assessment: post-procedure vital signs reviewed and stable Respiratory status: spontaneous breathing, nonlabored ventilation, respiratory function stable and patient connected to nasal cannula oxygen  Cardiovascular status: blood pressure returned to baseline and stable Postop Assessment: no apparent nausea or vomiting Anesthetic complications: no   No notable events documented.  Last Vitals:  Vitals:   11/14/24 1349 11/14/24 1400  BP: (!) 175/90 (!) 156/89  Pulse: 77 74  Resp: 16 12  Temp:  36.6 C  SpO2: 95% 97%    Last Pain:  Vitals:   11/14/24 1400  TempSrc:   PainSc: 0-No pain                 Franky JONETTA Bald      "

## 2024-11-14 NOTE — Discharge Instructions (Signed)
 "  [facility]  Discharge Instructions  AV Fistula or Graft Surgery for Dialysis Access  Please refer to the following instructions for your post-procedure care. Your surgeon or physician assistant will discuss any changes with you.  Activity  You may drive the day following your surgery, if you are comfortable and no longer taking prescription pain medication. Resume full activity as the soreness in your incision resolves.  Bathing/Showering  You may shower after you go home. Keep your incision dry for 48 hours. Do not soak in a bathtub, hot tub, or swim until the incision heals completely. You may not shower if you have a hemodialysis catheter.  Incision Care  Clean your incision with mild soap and water  after 48 hours. Pat the area dry with a clean towel. You do not need a bandage unless otherwise instructed. Do not apply any ointments or creams to your incision. You may have skin glue on your incision. Do not peel it off. It will come off on its own in about one week. Your arm may swell a bit after surgery. To reduce swelling use pillows to elevate your arm so it is above your heart. Your doctor will tell you if you need to lightly wrap your arm with an ACE bandage.  Diet  Resume your normal diet. There are not special food restrictions following this procedure. In order to heal from your surgery, it is CRITICAL to get adequate nutrition. Your body requires vitamins, minerals, and protein. Vegetables are the best source of vitamins and minerals. Vegetables also provide the perfect balance of protein. Processed food has little nutritional value, so try to avoid this.  Medications  Resume taking all of your medications. If your incision is causing pain, you may take over-the counter pain relievers such as acetaminophen  (Tylenol ). If you were prescribed a stronger pain medication, please be aware these medications can cause nausea and constipation. Prevent  nausea by taking the medication with a snack or meal. Avoid constipation by drinking plenty of fluids and eating foods with high amount of fiber, such as fruits, vegetables, and grains.  Do not take Tylenol  if you are taking prescription pain medications.  Follow up Your surgeon may want to see you in the office following your access surgery. If so, this will be arranged at the time of your surgery.  Please call us  immediately for any of the following conditions:  Increased pain, redness, drainage (pus) from your incision site Fever of 101 degrees or higher Severe or worsening pain at your incision site Hand pain or numbness.  Reduce your risk of vascular disease:  Stop smoking. If you would like help, call [facility] at 1-800-QUIT-NOW (772-285-8049) or [place] at [phone]  Manage your cholesterol Maintain a desired weight Control your diabetes Keep your blood pressure down  Dialysis  It will take several weeks to several months for your new dialysis access to be ready for use. Your surgeon will determine when it is okay to use it. Your nephrologist will continue to direct your dialysis. You can continue to use your Permcath until your new access is ready for use.   11/14/2024 Jack Cox [id number] 08-Jul-1966  Surgeon(s): Serene Gaile ORN, MD  Procedures: INSERTION, GRAFT, ARTERIOVENOUS, LEFT UPPER EXTREMITY USING PROPATEN 4-7 GRAFT EXCHANGE OF A DIALYSIS CATHETER USING 19CM PALINDROME CATHETER KIT   May stick graft immediately   May stick graft on designated area only:   X Do not stick left AV for  12 weeks    If you have any questions, please call the office at [phone]. "

## 2024-11-14 NOTE — H&P (Signed)
 "                                    [facility] [place]  Patient name: Jack Cox MRN: [id number] DOB: Jul 06, 1966 Sex: male   REASON FOR VISIT:    Dialysis  HISOTRY OF PRESENT ILLNESS:    Jack Cox is a 59 y.o. male in need of dialysis access.  He has a history of a occluded fistula on the left.  He had a venogram revealing he has an option for a left upper arm graft which he would like to keep in his left arm.  He is here today for his procedure   PAST MEDICAL HISTORY:   Past Medical History:  Diagnosis Date   Anemia    CKD (chronic kidney disease)    Congestive heart failure (CHF) (HCC)    Dysrhythmia    a-fib   Essential hypertension    Fatigue    GERD (gastroesophageal reflux disease)    Gout    Hypercholesteremia    Insomnia    Pneumonia    Proteinuria    Renal insufficiency    Shortness of breath    Sleep apnea    use biPAP at night and on oxygen  2L via [place] during day   Tracheostomy dependence (HCC)      FAMILY HISTORY:   Family History  Problem Relation Age of Onset   Stomach cancer Mother    Colon cancer Father     SOCIAL HISTORY:   Social History   Tobacco Use   Smoking status: Never   Smokeless tobacco: Never  Substance Use Topics   Alcohol use: Not Currently    Comment: once in a while     ALLERGIES:   Allergies[1]   CURRENT MEDICATIONS:   Current Facility-Administered Medications  Medication Dose Route Frequency Provider Last Rate Last Admin   0.9 %  sodium chloride  infusion   Intravenous Continuous Robins, Joshua E, MD       0.9 %  sodium chloride  infusion   Intravenous Continuous Hollis, Kevin D, MD       ceFAZolin  (ANCEF ) 2-4 GM/100ML-% IVPB            ceFAZolin  (ANCEF ) IVPB 2g/100 mL premix  2 g Intravenous 30 min Pre-Op  Robins, Joshua E, MD       chlorhexidine  (HIBICLENS ) 4 % liquid 4 Application  60 mL Topical Once Robins, Joshua E, MD       And   [START ON 11/15/2024] chlorhexidine   (HIBICLENS ) 4 % liquid 4 Application  60 mL Topical Once Lanis Fonda FORBES, MD        REVIEW OF SYSTEMS:   [X]  denotes positive finding, [ ]  denotes negative finding Cardiac  Comments:  Chest pain or chest pressure:    Shortness of breath upon exertion:    Short of breath when lying flat:    Irregular heart rhythm:        Vascular    Pain in calf, thigh, or hip brought on by ambulation:    Pain in feet at night that wakes you up from your sleep:     Blood clot in your veins:    Leg swelling:         Pulmonary    Oxygen  at home:    Productive cough:     Wheezing:         Neurologic  Sudden weakness in arms or legs:     Sudden numbness in arms or legs:     Sudden onset of difficulty speaking or slurred speech:    Temporary loss of vision in one eye:     Problems with dizziness:         Gastrointestinal    Blood in stool:     Vomited blood:         Genitourinary    Burning when urinating:     Blood in urine:        Psychiatric    Major depression:         Hematologic    Bleeding problems:    Problems with blood clotting too easily:        Skin    Rashes or ulcers:        Constitutional    Fever or chills:      PHYSICAL EXAM:   Vitals:   11/14/24 0752  BP: (!) (P) 146/83  Pulse: (P) 66  Resp: (P) 16  Temp: (P) 98 F (36.7 C)  TempSrc: (P) Oral  SpO2: (P) 97%  Weight: (P) 111.1 kg  Height: (P) 5' 7 (1.702 m)    GENERAL: The patient is a well-nourished male, in no acute distress. The vital signs are documented above. CARDIAC: There is a regular rate and rhythm.  VASCULAR: Palpable radial pulse PULMONARY: Non-labored respirations ABDOMEN: Soft and non-tender with normal pitched bowel sounds.  MUSCULOSKELETAL: There are no major deformities or cyanosis. NEUROLOGIC: No focal weakness or paresthesias are detected. SKIN: There are no ulcers or rashes noted. PSYCHIATRIC: The patient has a normal affect.  STUDIES:     MEDICAL ISSUES:   ESRD: The  patient currently dialyzes through a catheter.  He is in need of extremity access.  We discussed placing a left sided graft versus fistula.  Details of the surgery were discussed with the patient he wishes to proceed.  All questions were answered    Malvina Serene CLORE, MD, FACS [facility] [phone] Pager [phone]     [1] No Known Allergies  "

## 2024-11-14 NOTE — Transfer of Care (Signed)
 Immediate Anesthesia Transfer of Care Note  Patient: Jack Cox  Procedure(s) Performed: CREATION OF LEFT ARM BRACHIOBASILIC FISTULA (Left: Arm Upper) EXCHANGE OF A DIALYSIS CATHETER USING 19CM PALINDROME CATHETER KIT (Right: Chest)  Patient Location: PACU  Anesthesia Type:General  Level of Consciousness: awake, alert , and oriented  Airway & Oxygen  Therapy: Patient Spontanous Breathing  Post-op Assessment: Report given to RN, Post -op Vital signs reviewed and stable, and Patient moving all extremities  Post vital signs: Reviewed and stable  Last Vitals:  Vitals Value Taken Time  BP 163/97 11/15/23   12:43  Temp    Pulse 74 11/15/23    12:43  Resp 20 11/15/23    12:43  SpO2 93 11/15/23    12:43    Last Pain:  Vitals:   11/14/24 0752  TempSrc: (P) Oral         Complications: No notable events documented.

## 2024-11-15 ENCOUNTER — Encounter (HOSPITAL_COMMUNITY): Payer: Self-pay | Admitting: Surgery

## 2024-11-18 NOTE — Op Note (Signed)
 "   Patient name: Jack Cox MRN: [id number] DOB: [birth year] Sex: male  11/14/2024 Pre-operative Diagnosis: ESRD Post-operative diagnosis:  Same Surgeon:  Malvina New Assistants:  KYM Damme, PA Procedure:   #1: Exchange of right IJ tunneled dialysis catheter   #2: Left first stage basilic vein fistula Anesthesia:  General Blood Loss:  minimal Specimens:  none  Findings: The basilic vein appeared to be adequate for fistula creation.  There was a branch going to the artery from the previous procedure that appeared to be thrombosed, the rest of the vein appeared to be adequate  Indications: The patient is in need of new access.  He also has a catheter that is not functioning  Procedure:  The patient was identified in the holding area and taken to [facility] OR ROOM 16  The patient was then placed supine on the table. general anesthesia was administered.  The patient was prepped and draped in the usual sterile fashion.  A time out was called and antibiotics were administered.  A PA was necessary to expedite the procedure and assist with technical details.  She helped with exposure by providing suction and retraction.  She helped with the anastomosis by following suture.  She helped with catheter exchange.  The cuff of the existing catheter was dissected out sharply.  Once it was fully freed up, a 035 wire was inserted and the catheter was removed.  A new 23 cm catheter was then inserted.  The tip was at the cavoatrial junction.  The cuff was just inside the skin.  Both ports flushed and aspirated without difficulty.  Catheter was filled with the appropriate volumes of heparin  and sutured to the skin with 3-0 nylon  Attention was then turned towards the left arm.  The basilic vein in the upper arm appeared to be of adequate caliber for fistula creation with the exception of the portion near the antecubital crease.  There did appear to be a large branch possibly the main basilic vein that was also  adequate.  Therefore I elected to further evaluate this and consider a basilic vein fistula.  A transverse incision was made just proximal to the antecubital crease.  I first dissected out the brachial artery which was fairly scarred in from the previous surgery.  Once it was fully mobilized, attention was turned towards the basilic vein.  The basilic vein was identified and traced down to the antecubital crease where it had a median cubital branch which was occluded.  The primary basilic vein extended down the forearm and appeared to be adequate for fistula creation.  It was fully mobilized and then marked for orientation.  The vein was then ligated distally.  It distended nicely with heparin  saline.  The brachial artery was then occluded with vascular clamps and a #11 blade was used to make an arteriotomy which extended longitudinally with Potts scissors.  The vein was cut to the appropriate length and spatulated to fit the size of the arteriotomy.  A running anastomosis was then performed using 6-0 Prolene.  Prior to completion, the appropriate flushing maneuvers were performed and the anastomosis was completed.  I inspected the course of the vein to make sure there were kinks and that it sat nicely.  There was a good thrill within the fistula and a palpable radial pulse.  The wound was then irrigated.  Hemostasis was achieved.  The deep tissue was reapproximated with 3-0 Vicryl and the skin was closed with 4-0 Vicryl followed by Dermabond.  There were no immediate complications.   Disposition: To PACU stable.   ALONSO Malvina New, M.D., [facility] [place] Office: [phone] Pager:  [phone]  "

## 2024-12-08 ENCOUNTER — Other Ambulatory Visit: Payer: Self-pay

## 2024-12-08 DIAGNOSIS — N186 End stage renal disease: Secondary | ICD-10-CM

## 2025-01-01 ENCOUNTER — Encounter

## 2025-01-01 ENCOUNTER — Ambulatory Visit (HOSPITAL_COMMUNITY)
